# Patient Record
Sex: Female | Born: 1955 | Race: Black or African American | Hispanic: No | Marital: Married | Smoking: Never smoker
Health system: Southern US, Community
[De-identification: ages and names within clinical notes are randomized; demographics above are authoritative.]

## PROBLEM LIST (undated history)

## (undated) DIAGNOSIS — F419 Anxiety disorder, unspecified: Secondary | ICD-10-CM

## (undated) DIAGNOSIS — M1990 Unspecified osteoarthritis, unspecified site: Secondary | ICD-10-CM

## (undated) DIAGNOSIS — F331 Major depressive disorder, recurrent, moderate: Secondary | ICD-10-CM

## (undated) DIAGNOSIS — E119 Type 2 diabetes mellitus without complications: Secondary | ICD-10-CM

## (undated) DIAGNOSIS — G8929 Other chronic pain: Secondary | ICD-10-CM

## (undated) DIAGNOSIS — E079 Disorder of thyroid, unspecified: Secondary | ICD-10-CM

## (undated) DIAGNOSIS — T7840XA Allergy, unspecified, initial encounter: Secondary | ICD-10-CM

## (undated) DIAGNOSIS — G4730 Sleep apnea, unspecified: Secondary | ICD-10-CM

## (undated) DIAGNOSIS — N200 Calculus of kidney: Secondary | ICD-10-CM

## (undated) DIAGNOSIS — R7989 Other specified abnormal findings of blood chemistry: Secondary | ICD-10-CM

## (undated) DIAGNOSIS — G5603 Carpal tunnel syndrome, bilateral upper limbs: Secondary | ICD-10-CM

## (undated) DIAGNOSIS — M549 Dorsalgia, unspecified: Secondary | ICD-10-CM

## (undated) HISTORY — DX: Unspecified osteoarthritis, unspecified site: M19.90

## (undated) HISTORY — PX: NASAL SEPTUM SURGERY: SHX37

## (undated) HISTORY — DX: Other chronic pain: G89.29

## (undated) HISTORY — DX: Sleep apnea, unspecified: G47.30

## (undated) HISTORY — DX: Calculus of kidney: N20.0

## (undated) HISTORY — DX: Anxiety disorder, unspecified: F41.9

## (undated) HISTORY — DX: Allergy, unspecified, initial encounter: T78.40XA

## (undated) HISTORY — DX: Major depressive disorder, recurrent, moderate: F33.1

## (undated) HISTORY — DX: Other specified abnormal findings of blood chemistry: R79.89

## (undated) HISTORY — PX: TUBAL LIGATION: SHX77

## (undated) HISTORY — DX: Type 2 diabetes mellitus without complications: E11.9

## (undated) HISTORY — DX: Carpal tunnel syndrome, bilateral upper limbs: G56.03

## (undated) HISTORY — DX: Disorder of thyroid, unspecified: E07.9

## (undated) HISTORY — DX: Dorsalgia, unspecified: M54.9

## (undated) HISTORY — PX: OOPHORECTOMY: SHX86

## (undated) HISTORY — PX: CHOLECYSTECTOMY: SHX55

---

## 2012-07-08 ENCOUNTER — Emergency Department: Payer: Self-pay | Admitting: Emergency Medicine

## 2012-07-08 LAB — COMPREHENSIVE METABOLIC PANEL
Albumin: 3.8 g/dL (ref 3.4–5.0)
Alkaline Phosphatase: 82 U/L (ref 50–136)
Anion Gap: 5 — ABNORMAL LOW (ref 7–16)
BUN: 14 mg/dL (ref 7–18)
Chloride: 107 mmol/L (ref 98–107)
Co2: 27 mmol/L (ref 21–32)
Creatinine: 0.92 mg/dL (ref 0.60–1.30)
EGFR (African American): >60
EGFR (Non-African Amer.): >60
Glucose: 108 mg/dL — ABNORMAL HIGH (ref 65–99)
Osmolality: 279 (ref 275–301)
Potassium: 3.6 mmol/L (ref 3.5–5.1)
SGOT(AST): 21 U/L (ref 15–37)
Sodium: 139 mmol/L (ref 136–145)
Total Protein: 7.6 g/dL (ref 6.4–8.2)

## 2012-07-08 LAB — CBC
HCT: 36.6 % (ref 35.0–47.0)
MCH: 29.2 pg (ref 26.0–34.0)
RDW: 12.7 % (ref 11.5–14.5)
WBC: 5.5 10*3/uL (ref 3.6–11.0)

## 2012-07-08 LAB — URINALYSIS, COMPLETE
Bacteria: NONE SEEN
Bilirubin,UR: NEGATIVE
Glucose,UR: NEGATIVE mg/dL (ref 0–75)
Leukocyte Esterase: NEGATIVE
Protein: NEGATIVE
RBC,UR: 1 /HPF (ref 0–5)
Squamous Epithelial: 3
WBC UR: 1 /HPF (ref 0–5)

## 2012-07-08 LAB — TROPONIN I: Troponin-I: <0.02 ng/mL

## 2013-11-19 LAB — HM DEXA SCAN

## 2014-06-28 ENCOUNTER — Emergency Department: Payer: Self-pay | Admitting: Emergency Medicine

## 2014-06-28 LAB — CBC WITH DIFFERENTIAL/PLATELET
BASOS PCT: 0.5 %
Basophil #: 0 10*3/uL (ref 0.0–0.1)
EOS PCT: 1.7 %
Eosinophil #: 0.1 10*3/uL (ref 0.0–0.7)
HCT: 36.4 % (ref 35.0–47.0)
HGB: 12.2 g/dL (ref 12.0–16.0)
LYMPHS ABS: 2.9 10*3/uL (ref 1.0–3.6)
LYMPHS PCT: 36.8 %
MCH: 29 pg (ref 26.0–34.0)
MCHC: 33.4 g/dL (ref 32.0–36.0)
MCV: 87 fL (ref 80–100)
MONO ABS: 0.7 x10 3/mm (ref 0.2–0.9)
MONOS PCT: 8.3 %
Neutrophil #: 4.2 10*3/uL (ref 1.4–6.5)
Neutrophil %: 52.7 %
Platelet: 233 10*3/uL (ref 150–440)
RBC: 4.2 10*6/uL (ref 3.80–5.20)
RDW: 12.8 % (ref 11.5–14.5)
WBC: 7.9 10*3/uL (ref 3.6–11.0)

## 2014-06-28 LAB — COMPREHENSIVE METABOLIC PANEL
ALBUMIN: 4.5 g/dL
ALK PHOS: 71 U/L
ALT: 58 U/L — AB
Anion Gap: 6 — ABNORMAL LOW (ref 7–16)
BILIRUBIN TOTAL: 1.4 mg/dL — AB
BUN: 16 mg/dL
CHLORIDE: 104 mmol/L
CO2: 30 mmol/L
CREATININE: 0.79 mg/dL
Calcium, Total: 9.8 mg/dL
EGFR (African American): >60
EGFR (Non-African Amer.): >60
GLUCOSE: 118 mg/dL — AB
Potassium: 3.9 mmol/L
SGOT(AST): 37 U/L
Sodium: 140 mmol/L
Total Protein: 7.5 g/dL

## 2014-06-28 LAB — URINALYSIS, COMPLETE
Bacteria: NONE SEEN
Bilirubin,UR: NEGATIVE
GLUCOSE, UR: NEGATIVE mg/dL (ref 0–75)
Leukocyte Esterase: NEGATIVE
NITRITE: POSITIVE
PROTEIN: NEGATIVE
Ph: 7 (ref 4.5–8.0)
SPECIFIC GRAVITY: 1.018 (ref 1.003–1.030)
Squamous Epithelial: <1

## 2014-07-22 ENCOUNTER — Ambulatory Visit
Admit: 2014-07-22 | Disposition: A | Discharge status: Home / Self Care | Payer: Self-pay | Attending: Urology | Admitting: Urology

## 2014-10-14 ENCOUNTER — Telehealth: Payer: Self-pay

## 2014-10-14 ENCOUNTER — Ambulatory Visit (INDEPENDENT_AMBULATORY_CARE_PROVIDER_SITE_OTHER): Payer: BC Managed Care – PPO | Admitting: Family Medicine

## 2014-10-14 ENCOUNTER — Encounter: Payer: Self-pay | Admitting: Family Medicine

## 2014-10-14 VITALS — BP 130/84 | HR 83 | Temp 97.8°F | Ht 64.6 in | Wt 205.2 lb

## 2014-10-14 DIAGNOSIS — M7671 Peroneal tendinitis, right leg: Secondary | ICD-10-CM | POA: Diagnosis not present

## 2014-10-14 DIAGNOSIS — M9906 Segmental and somatic dysfunction of lower extremity: Secondary | ICD-10-CM

## 2014-10-14 MED ORDER — DICLOFENAC SODIUM 1 % TD GEL: 2.0000 g | Freq: Four times a day (QID) | TRANSDERMAL | Status: DC

## 2014-10-14 NOTE — Progress Notes (Signed)
BP 130/84 mmHg  Pulse 83  Temp(Src) 97.8 F (36.6 C)  Ht 5' 4.6" (1.641 m)  Wt 205 lb 3.2 oz (93.078 kg)  BMI 34.56 kg/m2  SpO2 97%  LMP  (LMP Unknown)   Subjective:    Patient ID: Tracie Sheppard, female    DOB: October 12, 1955, 59 y.o.   MRN: [id number]  HPI: Tracie Sheppard is a 59 y.o. female who presents today to establish care.   Chief Complaint  Patient presents with  . Ankle Pain    right ankle pain, she had have a DO in [place], but he is now teaching so he does not practice that much now.   Had pain in her R foot. Has some bone spurs. Saw podiatry. In a brace. Had been on eterolac- had severe belly pain wth it so did't take it. She notes that she just got back from [place] and did a lot of walking there and thinks that that aggrevated it. She had an x-ray done and was told that she has 3 bone spurs. She saw th podiatrist and he put her in a brace and gave her the medicine. She notes that her foot has hurt before and had been treated with OMM with good results. She would like someone to "squeeze her foot" today to see if it will help. She is otherwise feeling well with no other concerns or complaints at this time.   Relevant past medical, surgical, family and social history reviewed and updated as indicated. Interim medical history since our last visit reviewed. Allergies and medications reviewed and updated.  Review of Systems  Constitutional: Negative.   Respiratory: Negative.   Cardiovascular: Negative.   Musculoskeletal: Positive for myalgias and gait problem. Negative for back pain, joint swelling, arthralgias, neck pain and neck stiffness.    Per HPI unless specifically indicated above     Objective:    BP 130/84 mmHg  Pulse 83  Temp(Src) 97.8 F (36.6 C)  Ht 5' 4.6" (1.641 m)  Wt 205 lb 3.2 oz (93.078 kg)  BMI 34.56 kg/m2  SpO2 97%  LMP  (LMP Unknown)  Wt Readings from Last 3 Encounters:  10/14/14 205 lb 3.2 oz (93.078 kg)    Physical Exam   Constitutional: She is oriented to person, place, and time. She appears well-developed and well-nourished. No distress.  HENT:  Head: Normocephalic and atraumatic.  Right Ear: Hearing normal.  Left Ear: Hearing normal.  Nose: Nose normal.  Eyes: Conjunctivae and lids are normal. Right eye exhibits no discharge. Left eye exhibits no discharge. No scleral icterus.  Pulmonary/Chest: Effort normal. No respiratory distress.  Musculoskeletal: She exhibits tenderness. She exhibits no edema.  Neurological: She is alert and oriented to person, place, and time.  Skin: Skin is intact. No rash noted.  Psychiatric: She has a normal mood and affect. Her speech is normal and behavior is normal. Judgment and thought content normal. Cognition and memory are normal.    Musculoskeletal:  Exam found Decreased ROM, Tissue texture changes and Tenderness to palpation of patient's  lower extremity Osteopathic Structural Exam:   Lower Extremity: 5th metatarsal compressed, 5th toes compressed, fascial strain through interosseous membrane of metatarsals, Posterior fibular head, peroneal hypertonicity, dropped navicular  Results for orders placed or performed in visit on 10/14/14  HM DEXA SCAN  Result Value Ref Range   HM Dexa Scan Osteopenia       Assessment & Plan:   Problem List Items Addressed This Visit  Musculoskeletal and Integument   Peroneal tendonitis of right lower extremity - Primary    Other Visit Diagnoses    Lower limb region somatic dysfunction        see below.      After verbal consent was obtained, patient was treated today with osteopathic manipulative medicine to the regions of the lower extremity using the techniques of Still, myofascial release, muscle energy, HVLA and soft tissue. Areas of compensation relating to her primary pain source also treated. Patient tolerated the procedure well with good objective and good subjective improvement in symptoms. .sex left the room in good  condition. He was advised to stay well hydrated and that he may have some soreness following the procedure. If not improving or worsening, he will call and come in. Home exercise program of stretches for ankle discussed and demonstrated today. Patient will do these stretches BID to before the point of pain, and will return for reevaluation  in 1-2 weeks.   Follow up plan: Return 1-2 weeks, for OMM.

## 2014-10-14 NOTE — Telephone Encounter (Signed)
Received a voicemail from patient, she was prescribed a gel for her ankle pain. She was told by her pharmacy that it had to have prior approval through insurance. I spoke with Jonelle Sidle Foxx to make sure she has recieved the form for this.

## 2014-10-14 NOTE — Patient Instructions (Signed)
Peroneal Tendinitis with Rehab Tendonitis is inflammation of a tendon. Inflammation of the tendons on the back of the outer ankle (peroneal tendons) is known as peroneal tendonitis. The peroneal tendons are responsible for connecting the muscles that allow you to stand on your tiptoes to the bones of the ankle. For this reason, peroneal tendonitis often causes pain when trying to complete such motions. Peroneal tendonitis often involves a tear (strain) of the peroneal tendons. Strains are classified into three categories. Grade 1 strains cause pain, but the tendon is not lengthened. Grade 2 strains include a lengthened ligament, due to the ligament being stretched or partially ruptured. With grade 2 strains there is still function, although function may be decreased. Grade 3 strains involve a complete tear of the tendon or muscle, and function is usually impaired. SYMPTOMS   Pain, tenderness, swelling, warmth, or redness over the back of the outer side of the ankle, the outer part of the mid-foot, or the bottom of the arch.  Pain that gets worse with ankle motion (especially when pushing off or pushing down with the front of the foot), or when standing on the ball of the foot or pushing the foot outward.  Crackling sound (crepitation) when the tendon is moved or touched. CAUSES  Peroneal tendinitis occurs when injury to the peroneal tendons causes the body to respond with inflammation. Common causes of injury include:  An overuse injury, in which the groove behind the outer ankle (where the tendon is located) causes wear on the tendon.  A sudden stress placed on the tendon, such as from an increase in the intensity, frequency, or duration of training.  Direct hit (trauma) to the tendon.  Return to activity too soon after a previous ankle injury. RISK INCREASES WITH:  Sports that require sudden, repetitive pushing off of the foot, such as jumping or quick starts.  Kicking and running sports,  especially running down hills or long distances.  Poor strength and flexibility.  Previous injury to the foot, ankle, or leg. PREVENTION  Warm up and stretch properly before activity.  Allow for adequate recovery between workouts.  Maintain physical fitness:  Strength, flexibility, and endurance.  Cardiovascular fitness.  Complete rehabilitation after previous injury. PROGNOSIS  If treated properly, peroneal tendonitis usually heals within 6 weeks.  RELATED COMPLICATIONS  Longer healing time, if not properly treated or if not given enough time to heal.  Recurring symptoms if activity is resumed too soon, with overuse, or when using poor technique.  If untreated, tendinitis may result in tendon rupture, requiring surgery. TREATMENT  Treatment first involves the use of ice and medicine to reduce pain and inflammation. The use of strengthening and stretching exercises may help reduce pain with activity. These exercises may be performed at home or with a therapist. Sometimes, the foot and ankle will be restrained for 10 to 14 days to promote healing. Your caregiver may advise that you place a heel lift in your shoes to reduce the stress placed on the tendon. If nonsurgical treatment is unsuccessful, surgery to remove the inflamed tendon lining (sheath) may be advised.  MEDICATION   If pain medicine is needed, nonsteroidal anti-inflammatory medicines (aspirin and ibuprofen), or other minor pain relievers (acetaminophen), are often advised.  Do not take pain medicine for 7 days before surgery.  Prescription pain relievers may be given, if your caregiver thinks they are needed. Use only as directed and only as much as you need. HEAT AND COLD  Cold treatment (icing) should   be applied for 10 to 15 minutes every 2 to 3 hours for inflammation and pain, and immediately after activity that aggravates your symptoms. Use ice packs or an ice massage.  Heat treatment may be used before  performing stretching and strengthening activities prescribed by your caregiver, physical therapist, or athletic trainer. Use a heat pack or a warm water soak. SEEK MEDICAL CARE IF:  Symptoms get worse or do not improve in 2 to 4 weeks, despite treatment.  New, unexplained symptoms develop. (Drugs used in treatment may produce side effects.) EXERCISES RANGE OF MOTION (ROM) AND STRETCHING EXERCISES - Peroneal Tendinitis These exercises may help you when beginning to rehabilitate your injury. Your symptoms may resolve with or without further involvement from your physician, physical therapist or athletic trainer. While completing these exercises, remember:   Restoring tissue flexibility helps normal motion to return to the joints. This allows healthier, less painful movement and activity.  An effective stretch should be held for at least 30 seconds.  A stretch should never be painful. You should only feel a gentle lengthening or release in the stretched tissue. RANGE OF MOTION - Ankle Eversion  Sit with your right / left ankle crossed over your opposite knee.  Grip your foot with your opposite hand, placing your thumb on the top of your foot and your fingers across the bottom of your foot.  Gently push your foot downward with a slight rotation, so your littlest toes rise slightly toward the ceiling.  You should feel a gentle stretch on the inside of your ankle. Hold the stretch for __________ seconds. Repeat __________ times. Complete this exercise __________ times per day.  RANGE OF MOTION - Ankle Inversion  Sit with your right / left ankle crossed over your opposite knee.  Grip your foot with your opposite hand, placing your thumb on the bottom of your foot and your fingers across the top of your foot.  Gently pull your foot so the smallest toe comes toward you and your thumb pushes the inside of the ball of your foot away from you.  You should feel a gentle stretch on the outside of  your ankle. Hold the stretch for __________ seconds. Repeat __________ times. Complete this exercise __________ times per day.  RANGE OF MOTION - Ankle Plantar Flexion  Sit with your right / left leg crossed over your opposite knee.  Use your opposite hand to pull the top of your foot and toes toward you.  You should feel a gentle stretch on the top of your foot and ankle. Hold this position for __________ seconds. Repeat __________ times. Complete __________ times per day.  STRETCH - Gastroc, Standing  Place your hands on a wall.  Extend your right / left leg behind you, keeping the front knee somewhat bent.  Slightly point your toes inward on your back foot.  Keeping your right / left heel on the floor and your knee straight, shift your weight toward the wall, not allowing your back to arch.  You should feel a gentle stretch in the calf. Hold this position for __________ seconds. Repeat __________ times. Complete this stretch __________ times per day. STRETCH - Soleus, Standing  Place your hands on a wall.  Extend your right / left leg behind you, keeping the other knee somewhat bent.  Slightly point your toes inward on your back foot.  Keep your heel on the floor, bend your back knee, and slightly shift your weight over the back leg so that   you feel a gentle stretch deep in your back calf.  Hold this position for __________ seconds. Repeat __________ times. Complete this stretch __________ times per day. STRETCH - Gastrocsoleus, Standing Note: This exercise can place a lot of stress on your foot and ankle. Please complete this exercise only if specifically instructed by your caregiver.   Place the ball of your right / left foot on a step, keeping your other foot firmly on the same step.  Hold on to the wall or a rail for balance.  Slowly lift your other foot, allowing your body weight to press your heel down over the edge of the step.  You should feel a stretch in your  right / left calf.  Hold this position for __________ seconds.  Repeat this exercise with a slight bend in your knee. Repeat __________ times. Complete this stretch __________ times per day.  STRENGTHENING EXERCISES - Peroneal Tendinitis  These exercises may help you when beginning to rehabilitate your injury. They may resolve your symptoms with or without further involvement from your physician, physical therapist or athletic trainer. While completing these exercises, remember:   Muscles can gain both the endurance and the strength needed for everyday activities through controlled exercises.  Complete these exercises as instructed by your physician, physical therapist or athletic trainer. Increase the resistance and repetitions only as guided by your caregiver. STRENGTH - Dorsiflexors  Secure a rubber exercise band or tubing to a fixed object (table, pole) and loop the other end around your right / left foot.  Sit on the floor facing the fixed object. The band should be slightly tense when your foot is relaxed.  Slowly draw your foot back toward you, using your ankle and toes.  Hold this position for __________ seconds. Slowly release the tension in the band and return your foot to the starting position. Repeat __________ times. Complete this exercise __________ times per day.  STRENGTH - Towel Curls  Sit in a chair, on a non-carpeted surface.  Place your foot on a towel, keeping your heel on the floor.  Pull the towel toward your heel only by curling your toes. Keep your heel on the floor.  If instructed by your physician, physical therapist or athletic trainer, add weight to the end of the towel. Repeat __________ times. Complete this exercise __________ times per day. STRENGTH - Ankle Eversion   Secure one end of a rubber exercise band or tubing to a fixed object (table, pole). Loop the other end around your foot, just before your toes.  Place your fists between your knees.  This will focus your strengthening at your ankle.  Drawing the band across your opposite foot, away from the pole, slowly, pull your little toe out and up. Make sure the band is positioned to resist the entire motion.  Hold this position for __________ seconds.  Have your muscles resist the band, as it slowly pulls your foot back to the starting position. Repeat __________ times. Complete this exercise __________ times per day.  Document Released: 03/21/2005 Document Revised: 08/05/2013 Document Reviewed: 07/03/2008 [employer] Patient Information 2015 [employer]. This information is not intended to replace advice given to you by your health care provider. Make sure you discuss any questions you have with your health care provider.  

## 2014-10-14 NOTE — Telephone Encounter (Signed)
Called and notified patient of the process of a prior authorization,she understood, I let her know it could take up to 72hrs.

## 2014-10-22 ENCOUNTER — Telehealth: Payer: Self-pay | Admitting: Family Medicine

## 2014-10-22 NOTE — Telephone Encounter (Signed)
E-fax came through for refill on: Rx: diclofenac sodium (VOLTAREN) 1 % GEL Copy in basket

## 2014-10-23 NOTE — Telephone Encounter (Signed)
Received fax, prior authorization was done and approved. It is approved from June 20,2016 to July 20,2017. Patient notified of approval.

## 2014-10-29 ENCOUNTER — Ambulatory Visit: Payer: BC Managed Care – PPO | Admitting: Family Medicine

## 2014-10-31 ENCOUNTER — Encounter: Payer: Self-pay | Admitting: Family Medicine

## 2014-10-31 ENCOUNTER — Ambulatory Visit (INDEPENDENT_AMBULATORY_CARE_PROVIDER_SITE_OTHER): Payer: BC Managed Care – PPO | Admitting: Family Medicine

## 2014-10-31 VITALS — BP 116/75 | HR 94 | Temp 98.0°F | Wt 206.0 lb

## 2014-10-31 DIAGNOSIS — M9905 Segmental and somatic dysfunction of pelvic region: Secondary | ICD-10-CM | POA: Diagnosis not present

## 2014-10-31 DIAGNOSIS — M9906 Segmental and somatic dysfunction of lower extremity: Secondary | ICD-10-CM | POA: Diagnosis not present

## 2014-10-31 DIAGNOSIS — M9901 Segmental and somatic dysfunction of cervical region: Secondary | ICD-10-CM

## 2014-10-31 DIAGNOSIS — M545 Low back pain, unspecified: Secondary | ICD-10-CM

## 2014-10-31 DIAGNOSIS — M9903 Segmental and somatic dysfunction of lumbar region: Secondary | ICD-10-CM

## 2014-10-31 DIAGNOSIS — M9902 Segmental and somatic dysfunction of thoracic region: Secondary | ICD-10-CM

## 2014-10-31 DIAGNOSIS — M9900 Segmental and somatic dysfunction of head region: Secondary | ICD-10-CM

## 2014-10-31 DIAGNOSIS — M489 Spondylopathy, unspecified: Secondary | ICD-10-CM

## 2014-10-31 DIAGNOSIS — M9904 Segmental and somatic dysfunction of sacral region: Secondary | ICD-10-CM

## 2014-10-31 DIAGNOSIS — M9909 Segmental and somatic dysfunction of abdomen and other regions: Secondary | ICD-10-CM | POA: Diagnosis not present

## 2014-10-31 DIAGNOSIS — M7671 Peroneal tendinitis, right leg: Secondary | ICD-10-CM

## 2014-10-31 DIAGNOSIS — M999 Biomechanical lesion, unspecified: Secondary | ICD-10-CM
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
## 2014-11-01 DIAGNOSIS — M545 Low back pain, unspecified: Secondary | ICD-10-CM | POA: Insufficient documentation
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
## 2014-11-01 NOTE — Assessment & Plan Note (Signed)
Doing significantly better following treatment. Did not see podiatry. Has been wearing brace as needed. Use voltaren gel as needed to avoid belly pain. Patient does appear to have somatic dysfunction that I think would benefit from OMT. Patient treated today with good results as discussed below.

## 2014-11-17 ENCOUNTER — Ambulatory Visit (INDEPENDENT_AMBULATORY_CARE_PROVIDER_SITE_OTHER): Payer: BC Managed Care – PPO | Admitting: Family Medicine

## 2014-11-17 ENCOUNTER — Encounter: Payer: Self-pay | Admitting: Family Medicine

## 2014-11-17 VITALS — BP 122/74 | HR 77 | Temp 98.3°F | Wt 205.2 lb

## 2014-11-17 DIAGNOSIS — M9903 Segmental and somatic dysfunction of lumbar region: Secondary | ICD-10-CM | POA: Diagnosis not present

## 2014-11-17 DIAGNOSIS — M489 Spondylopathy, unspecified: Secondary | ICD-10-CM

## 2014-11-17 DIAGNOSIS — M9904 Segmental and somatic dysfunction of sacral region: Secondary | ICD-10-CM | POA: Diagnosis not present

## 2014-11-17 DIAGNOSIS — M9906 Segmental and somatic dysfunction of lower extremity: Secondary | ICD-10-CM

## 2014-11-17 DIAGNOSIS — M545 Low back pain, unspecified: Secondary | ICD-10-CM

## 2014-11-17 DIAGNOSIS — M9905 Segmental and somatic dysfunction of pelvic region: Secondary | ICD-10-CM

## 2014-11-17 DIAGNOSIS — M9902 Segmental and somatic dysfunction of thoracic region: Secondary | ICD-10-CM | POA: Diagnosis not present
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
## 2014-11-17 NOTE — Patient Instructions (Signed)
Sports Medicine Patient Advisor Stretches and Information on IT bands given today.

## 2014-11-19 ENCOUNTER — Ambulatory Visit: Payer: BC Managed Care – PPO | Admitting: Family Medicine

## 2014-11-21 ENCOUNTER — Ambulatory Visit: Payer: BC Managed Care – PPO | Admitting: Family Medicine

## 2014-12-22 ENCOUNTER — Encounter: Payer: Self-pay | Admitting: Family Medicine

## 2014-12-22 ENCOUNTER — Ambulatory Visit (INDEPENDENT_AMBULATORY_CARE_PROVIDER_SITE_OTHER): Payer: BC Managed Care – PPO | Admitting: Family Medicine

## 2014-12-22 VITALS — BP 115/72 | HR 92 | Temp 97.6°F | Wt 200.0 lb

## 2014-12-22 DIAGNOSIS — M9908 Segmental and somatic dysfunction of rib cage: Secondary | ICD-10-CM

## 2014-12-22 DIAGNOSIS — M9909 Segmental and somatic dysfunction of abdomen and other regions: Secondary | ICD-10-CM | POA: Diagnosis not present

## 2014-12-22 DIAGNOSIS — M9904 Segmental and somatic dysfunction of sacral region: Secondary | ICD-10-CM

## 2014-12-22 DIAGNOSIS — J069 Acute upper respiratory infection, unspecified: Secondary | ICD-10-CM

## 2014-12-22 DIAGNOSIS — M545 Low back pain, unspecified: Secondary | ICD-10-CM

## 2014-12-22 DIAGNOSIS — M999 Biomechanical lesion, unspecified: Secondary | ICD-10-CM

## 2014-12-22 DIAGNOSIS — M9905 Segmental and somatic dysfunction of pelvic region: Secondary | ICD-10-CM | POA: Diagnosis not present

## 2014-12-22 DIAGNOSIS — M9902 Segmental and somatic dysfunction of thoracic region: Secondary | ICD-10-CM

## 2014-12-22 DIAGNOSIS — M9903 Segmental and somatic dysfunction of lumbar region: Secondary | ICD-10-CM | POA: Diagnosis not present

## 2014-12-22 DIAGNOSIS — M9900 Segmental and somatic dysfunction of head region: Secondary | ICD-10-CM

## 2014-12-22 DIAGNOSIS — M489 Spondylopathy, unspecified: Secondary | ICD-10-CM

## 2014-12-22 LAB — INFLUENZA A+B AG, EIA
INFLUENZA A AG, EIA: NEGATIVE
Influenza B Ag, EIA: NEGATIVE

## 2014-12-22 LAB — PLEASE NOTE:

## 2014-12-22 MED ORDER — BENZONATATE 200 MG PO CAPS: 200.0000 mg | ORAL_CAPSULE | Freq: Two times a day (BID) | ORAL | Status: DC | PRN

## 2014-12-22 MED ORDER — AZITHROMYCIN 250 MG PO TABS: ORAL_TABLET | ORAL | Status: DC

## 2014-12-22 MED ORDER — PREDNISONE 10 MG PO TABS: ORAL_TABLET | ORAL | Status: DC
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
## 2014-12-23 ENCOUNTER — Telehealth: Payer: Self-pay | Admitting: Family Medicine

## 2014-12-23 NOTE — Telephone Encounter (Signed)
Patient notified that sweating is a side effect of the prednisone.

## 2014-12-23 NOTE — Telephone Encounter (Signed)
Pt is sweating extremely bad and would like to know if this is normal

## 2015-01-05 ENCOUNTER — Ambulatory Visit: Payer: BC Managed Care – PPO | Admitting: Family Medicine

## 2015-01-07 ENCOUNTER — Ambulatory Visit (INDEPENDENT_AMBULATORY_CARE_PROVIDER_SITE_OTHER): Payer: BC Managed Care – PPO | Admitting: Family Medicine

## 2015-01-07 ENCOUNTER — Encounter: Payer: Self-pay | Admitting: Family Medicine

## 2015-01-07 VITALS — BP 125/80 | HR 89 | Temp 98.2°F | Ht 64.3 in | Wt 199.0 lb

## 2015-01-07 DIAGNOSIS — N393 Stress incontinence (female) (male): Secondary | ICD-10-CM

## 2015-01-07 DIAGNOSIS — N3001 Acute cystitis with hematuria: Secondary | ICD-10-CM | POA: Diagnosis not present

## 2015-01-07 DIAGNOSIS — J209 Acute bronchitis, unspecified: Secondary | ICD-10-CM | POA: Diagnosis not present

## 2015-01-07 LAB — MICROSCOPIC EXAMINATION
RENAL EPITHEL UA: NONE SEEN /HPF
WBC, UA: >30 /hpf — AB (ref 0–?)

## 2015-01-07 MED ORDER — ALBUTEROL SULFATE HFA 108 (90 BASE) MCG/ACT IN AERS: 2.0000 | INHALATION_SPRAY | Freq: Four times a day (QID) | RESPIRATORY_TRACT | Status: DC | PRN

## 2015-01-07 MED ORDER — BENZONATATE 200 MG PO CAPS: 200.0000 mg | ORAL_CAPSULE | Freq: Two times a day (BID) | ORAL | Status: DC | PRN

## 2015-01-07 MED ORDER — CIPROFLOXACIN HCL 500 MG PO TABS
500.0000 mg | ORAL_TABLET | Freq: Two times a day (BID) | ORAL | Status: DC
Start: 2015-01-07 — End: 2015-01-21

## 2015-01-07 NOTE — Progress Notes (Signed)
BP 125/80 mmHg  Pulse 89  Temp(Src) 98.2 F (36.8 C)  Ht 5' 4.3" (1.633 m)  Wt 199 lb (90.266 kg)  BMI 33.85 kg/m2  SpO2 98%  LMP  (LMP Unknown)   Subjective:    Patient ID: Tracie Sheppard, female    DOB: Jan 13, 1956, 59 y.o.   MRN: [id number]  HPI: Tracie Sheppard is a 59 y.o. female  Chief Complaint  Patient presents with  . lung recheck   Tracie Sheppard presents today for a lung recheck following a case of bronchitis. She states that she is feeling better following her course of prednisone and her z-pack. But she is not feeling 100%, still coughing quite a lot. Notes quite a bit of sweating. Still quite tired. Notes that she has been having stress incontinence every time she coughs. Concerned and wants to make sure she feels better.  URINARY SYMPTOMS Dysuria: no Urinary frequency: yes Urgency: yes Small volume voids: no Symptom severity: moderate Urinary incontinence: yes only with coughing Foul odor: no Hematuria: no Abdominal pain: no Back pain: no Suprapubic pain/pressure: no Flank pain: no Fever:  no Vomiting: no  Relevant past medical, surgical, family and social history reviewed and updated as indicated. Interim medical history since our last visit reviewed. Allergies and medications reviewed and updated.  Review of Systems  Constitutional: Negative.   HENT: Negative.   Respiratory: Negative.   Cardiovascular: Negative.   Psychiatric/Behavioral: Negative.    Per HPI unless specifically indicated above     Objective:    BP 125/80 mmHg  Pulse 89  Temp(Src) 98.2 F (36.8 C)  Ht 5' 4.3" (1.633 m)  Wt 199 lb (90.266 kg)  BMI 33.85 kg/m2  SpO2 98%  LMP  (LMP Unknown)  Wt Readings from Last 3 Encounters:  01/07/15 199 lb (90.266 kg)  12/22/14 200 lb (90.719 kg)  11/17/14 205 lb 3.2 oz (93.078 kg)    Physical Exam  Constitutional: She is oriented to person, place, and time. She appears well-developed and well-nourished. No distress.  HENT:  Head:  Normocephalic and atraumatic.  Right Ear: Hearing normal.  Left Ear: Hearing normal.  Nose: Nose normal.  Eyes: Conjunctivae and lids are normal. Right eye exhibits no discharge. Left eye exhibits no discharge. No scleral icterus.  Cardiovascular: Normal rate, regular rhythm, normal heart sounds and intact distal pulses.  Exam reveals no gallop and no friction rub.   No murmur heard. Pulmonary/Chest: Effort normal and breath sounds normal. No respiratory distress. She has no wheezes. She has no rales. She exhibits no tenderness.  Musculoskeletal: Normal range of motion.  Neurological: She is alert and oriented to person, place, and time.  Skin: Skin is warm, dry and intact. No rash noted. No erythema. No pallor.  Psychiatric: She has a normal mood and affect. Her speech is normal and behavior is normal. Judgment and thought content normal. Cognition and memory are normal.  Nursing note and vitals reviewed.   Results for orders placed or performed in visit on 12/22/14  Influenza A+B Ag, EIA  Result Value Ref Range   Influenza A Ag, EIA Negative Negative   Influenza B Ag, EIA Negative Negative   Influenza Comment See note   Please note:  Result Value Ref Range   Please note: Comment       Assessment & Plan:   Problem List Items Addressed This Visit    None    Visit Diagnoses    Acute bronchitis, unspecified organism    -  Primary    Resolved, now post-infectious cough. Continue tessalon perles and will start inhaler. Continue to monitor. Let us know if not getting better or getting worse.     Stress incontinence        UA positive for UTI today. Information about stress incontinence given. Timed voiding. Continue to mointor.     Relevant Orders    UA/M w/rflx Culture, Routine    Acute cystitis with hematuria        +UA- will treat for 7 days with cipro. Advised probiotics. Continue to monitor.         Follow up plan: Return if symptoms worsen or fail to  improve.

## 2015-01-07 NOTE — Patient Instructions (Signed)
Urinary Incontinence Urinary incontinence is the involuntary loss of urine from your bladder. CAUSES  There are many causes of urinary incontinence. They include:  Medicines.  Infections.  Prostatic enlargement, leading to overflow of urine from your bladder.  Surgery.  Neurological diseases.  Emotional factors. SIGNS AND SYMPTOMS Urinary Incontinence can be divided into four types: 1. Urge incontinence. Urge incontinence is the involuntary loss of urine before you have the opportunity to go to the bathroom. There is a sudden urge to void but not enough time to reach a bathroom. 2. Stress incontinence. Stress incontinence is the sudden loss of urine with any activity that forces urine to pass. It is commonly caused by anatomical changes to the pelvis and sphincter areas of your body. 3. Overflow incontinence. Overflow incontinence is the loss of urine from an obstructed opening to your bladder. This results in a backup of urine and a resultant buildup of pressure within the bladder. When the pressure within the bladder exceeds the closing pressure of the sphincter, the urine overflows, which causes incontinence, similar to water overflowing a dam. 4. Total incontinence. Total incontinence is the loss of urine as a result of the inability to store urine within your bladder. DIAGNOSIS  Evaluating the cause of incontinence may require:  A thorough and complete medical and obstetric history.  A complete physical exam.  Laboratory tests such as a urine culture and sensitivities. When additional tests are indicated, they can include:  An ultrasound exam.  Kidney and bladder X-rays.  Cystoscopy. This is an exam of the bladder using a narrow scope.  Urodynamic testing to test the nerve function to the bladder and sphincter areas. TREATMENT  Treatment for urinary incontinence depends on the cause:  For urge incontinence caused by a bacterial infection, antibiotics will be prescribed.  If the urge incontinence is related to medicines you take, your health care provider may have you change the medicine.  For stress incontinence, surgery to re-establish anatomical support to the bladder or sphincter, or both, will often correct the condition.  For overflow incontinence caused by an enlarged prostate, an operation to open the channel through the enlarged prostate will allow the flow of urine out of the bladder. In women with fibroids, a hysterectomy may be recommended.  For total incontinence, surgery on your urinary sphincter may help. An artificial urinary sphincter (an inflatable cuff placed around the urethra) may be required. In women who have developed a hole-like passage between their bladder and vagina (vesicovaginal fistula), surgery to close the fistula often is required. HOME CARE INSTRUCTIONS  Normal daily hygiene and the use of pads or adult diapers that are changed regularly will help prevent odors and skin damage.  Avoid caffeine. It can overstimulate your bladder.  Use the bathroom regularly. Try about every 2-3 hours to go to the bathroom, even if you do not feel the need to do so. Take time to empty your bladder completely. After urinating, wait a minute. Then try to urinate again.  For causes involving nerve dysfunction, keep a log of the medicines you take and a journal of the times you go to the bathroom. SEEK MEDICAL CARE IF:  You experience worsening of pain instead of improvement in pain after your procedure.  Your incontinence becomes worse instead of better. SEE IMMEDIATE MEDICAL CARE IF:  You experience fever or shaking chills.  You are unable to pass your urine.  You have redness spreading into your groin or down into your thighs. MAKE SURE   YOU:   Understand these instructions.   Will watch your condition.  Will get help right away if you are not doing well or get worse.   This information is not intended to replace advice given to you  by your health care provider. Make sure you discuss any questions you have with your health care provider.   Document Released: 04/28/2004 Document Revised: 04/11/2014 Document Reviewed: 08/28/2012 Elsevier Interactive Patient Education 2016 [employer]  

## 2015-01-11 LAB — URINE CULTURE, REFLEX

## 2015-01-11 LAB — UA/M W/RFLX CULTURE, ROUTINE

## 2015-01-21 ENCOUNTER — Ambulatory Visit (INDEPENDENT_AMBULATORY_CARE_PROVIDER_SITE_OTHER): Payer: BC Managed Care – PPO | Admitting: Family Medicine

## 2015-01-21 ENCOUNTER — Encounter: Payer: Self-pay | Admitting: Family Medicine

## 2015-01-21 VITALS — BP 111/68 | HR 82 | Temp 98.3°F | Ht 64.6 in | Wt 199.0 lb

## 2015-01-21 DIAGNOSIS — R319 Hematuria, unspecified: Secondary | ICD-10-CM | POA: Diagnosis not present

## 2015-01-21 DIAGNOSIS — R32 Unspecified urinary incontinence: Secondary | ICD-10-CM | POA: Diagnosis not present

## 2015-01-21 DIAGNOSIS — I952 Hypotension due to drugs: Secondary | ICD-10-CM

## 2015-01-21 LAB — MICROSCOPIC EXAMINATION

## 2015-01-21 LAB — UA/M W/RFLX CULTURE, ROUTINE
BILIRUBIN UA: NEGATIVE
Glucose, UA: NEGATIVE
Ketones, UA: NEGATIVE
Leukocytes, UA: NEGATIVE
NITRITE UA: NEGATIVE
PH UA: 5.5 (ref 5.0–7.5)
Protein, UA: NEGATIVE
Specific Gravity, UA: 1.025 (ref 1.005–1.030)
UUROB: 0.2 mg/dL (ref 0.2–1.0)

## 2015-01-21 NOTE — Patient Instructions (Signed)
Hypotension  As your heart beats, it forces blood through your arteries. This force is your blood pressure. If your blood pressure is too low for you to go about your normal activities or to support the organs of your body, you have hypotension. Hypotension is also referred to as low blood pressure. When your blood pressure becomes too low, you may not get enough blood to your brain. As a result, you may feel weak, feel lightheaded, or develop a rapid heart rate. In a more severe case, you may faint.  CAUSES  Various conditions can cause hypotension. These include:  · Blood loss.  · Dehydration.  · Heart or endocrine problems.  · Pregnancy.  · Severe infection.  · Not having a well-balanced diet filled with needed nutrients.  · Severe allergic reactions (anaphylaxis).  Some medicines, such as blood pressure medicine or water pills (diuretics), may lower your blood pressure below normal. Sometimes taking too much medicine or taking medicine not as directed can cause hypotension.  TREATMENT   Hospitalization is sometimes required for hypotension if fluid or blood replacement is needed, if time is needed for medicines to wear off, or if further monitoring is needed. Treatment might include changing your diet, changing your medicines (including medicines aimed at raising your blood pressure), and use of support stockings.  HOME CARE INSTRUCTIONS   · Drink enough fluids to keep your urine clear or pale yellow.  · Take your medicines as directed by your health care provider.  · Get up slowly from reclining or sitting positions. This gives your blood pressure a chance to adjust.  · Wear support stockings as directed by your health care provider.  · Maintain a healthy diet by including nutritious food, such as fruits, vegetables, nuts, whole grains, and lean meats.  SEEK MEDICAL CARE IF:  · You have vomiting or diarrhea.  · You have a fever for more than 2-3 days.  · You feel more thirsty than usual.  · You feel weak and  tired.  SEEK IMMEDIATE MEDICAL CARE IF:   · You have chest pain or a fast or irregular heartbeat.  · You have a loss of feeling in some part of your body, or you lose movement in your arms or legs.  · You have trouble speaking.  · You become sweaty or feel lightheaded.  · You faint.  MAKE SURE YOU:   · Understand these instructions.  · Will watch your condition.  · Will get help right away if you are not doing well or get worse.     This information is not intended to replace advice given to you by your health care provider. Make sure you discuss any questions you have with your health care provider.     Document Released: 03/21/2005 Document Revised: 01/09/2013 Document Reviewed: 09/21/2012  Elsevier Interactive Patient Education ©2016 [employer]

## 2015-01-21 NOTE — Progress Notes (Signed)
BP 111/68 mmHg  Pulse 82  Temp(Src) 98.3 F (36.8 C)  Ht 5' 4.6" (1.641 m)  Wt 199 lb (90.266 kg)  BMI 33.52 kg/m2  SpO2 96%  LMP  (LMP Unknown)   Subjective:    Patient ID: Tracie Sheppard, female    DOB: November 29, 1955, 60 y.o.   MRN: [id number]  HPI: Tracie Sheppard is a 59 y.o. female  Chief Complaint  Patient presents with  . Urinary Tract Infection    follow up   URINARY SYMPTOMS Dysuria: no Urinary frequency: no Urgency: no Small volume voids: yes Symptom severity: mild Urinary incontinence: no Foul odor: no Hematuria: no Abdominal pain: no Back pain: no  Suprapubic pain/pressure: yes Flank pain: no Fever:  yes, no, subjective and low grade Vomiting: no  Had shrimp about a month ago and felt awful following eating it- nausea and vomiting, does not want to have a RAST test right now  Started feeling nauseous just a couple of minutes ago  Relevant past medical, surgical, family and social history reviewed and updated as indicated. Interim medical history since our last visit reviewed. Allergies and medications reviewed and updated.  Review of Systems  Constitutional: Negative.   Respiratory: Negative.   Cardiovascular: Negative.   Gastrointestinal: Positive for nausea. Negative for vomiting, diarrhea, constipation, abdominal distention, anal bleeding and rectal pain.  Genitourinary: Negative.   Psychiatric/Behavioral: Negative.     Per HPI unless specifically indicated above     Objective:    BP 111/68 mmHg  Pulse 82  Temp(Src) 98.3 F (36.8 C)  Ht 5' 4.6" (1.641 m)  Wt 199 lb (90.266 kg)  BMI 33.52 kg/m2  SpO2 96%  LMP  (LMP Unknown)  Wt Readings from Last 3 Encounters:  01/21/15 199 lb (90.266 kg)  01/07/15 199 lb (90.266 kg)  12/22/14 200 lb (90.719 kg)    Physical Exam  Constitutional: She is oriented to person, place, and time. She appears well-developed and well-nourished. No distress.  HENT:  Head: Normocephalic and atraumatic.  Right  Ear: Hearing normal.  Left Ear: Hearing normal.  Nose: Nose normal.  Eyes: Conjunctivae and lids are normal. Right eye exhibits no discharge. Left eye exhibits no discharge. No scleral icterus.  Cardiovascular: Normal rate, regular rhythm, normal heart sounds and intact distal pulses.  Exam reveals no gallop and no friction rub.   No murmur heard. Pulmonary/Chest: Effort normal and breath sounds normal. No respiratory distress. She has no wheezes. She has no rales. She exhibits no tenderness.  Musculoskeletal: Normal range of motion.  Neurological: She is alert and oriented to person, place, and time.  Skin: Skin is intact. No rash noted.  Psychiatric: She has a normal mood and affect. Her speech is normal and behavior is normal. Judgment and thought content normal. Cognition and memory are normal.  Nursing note and vitals reviewed.   Results for orders placed or performed in visit on 01/07/15  Microscopic Examination  Result Value Ref Range   WBC, UA >30 (A) 0 -  5 /hpf   RBC, UA 3-10 (A) 0 -  2 /hpf   Epithelial Cells (non renal) 0-10 0 - 10 /hpf   Renal Epithel, UA None seen None seen /hpf   Mucus, UA Present Not Estab.   Bacteria, UA Few None seen/Few  UA/M w/rflx Culture, Routine  Result Value Ref Range   Urine Culture, Routine Final report (A)    Urine Culture result 1 Escherichia coli (A)    ANTIMICROBIAL SUSCEPTIBILITY  Comment   Urine Culture, Routine  Result Value Ref Range   Urine Culture result 1 Escherichia coli (A)       Assessment & Plan:   Problem List Items Addressed This Visit      Cardiovascular and Mediastinum   Hypotension due to drugs - Primary    BP 98/60- did not eat or drink this AM. Better after a big glass of water. Encouraged regular eating and increased fluid intake, if continues to be low, consider stopping her carvedilol        Other   Hematuria    Has a history of stones, but has persistent blood in her urine after UTI cleared up. Will  recheck next visit and if continues, will send her to urology for eval. No pain. No other symptoms.        Other Visit Diagnoses    Urinary incontinence, unspecified incontinence type        Resolved with treatment of UTI. Continue to mointor.     Relevant Orders    UA/M w/rflx Culture, Routine        Follow up plan: Return 2-4 weeks for BP check/OMM if needed.

## 2015-01-21 NOTE — Assessment & Plan Note (Signed)
Has a history of stones, but has persistent blood in her urine after UTI cleared up. Will recheck next visit and if continues, will send her to urology for eval. No pain. No other symptoms.

## 2015-01-21 NOTE — Assessment & Plan Note (Signed)
BP 98/60- did not eat or drink this AM. Better after a big glass of water. Encouraged regular eating and increased fluid intake, if continues to be low, consider stopping her carvedilol

## 2015-02-04 ENCOUNTER — Ambulatory Visit (INDEPENDENT_AMBULATORY_CARE_PROVIDER_SITE_OTHER): Payer: BC Managed Care – PPO | Admitting: Family Medicine

## 2015-02-04 ENCOUNTER — Encounter: Payer: Self-pay | Admitting: Family Medicine

## 2015-02-04 ENCOUNTER — Other Ambulatory Visit: Payer: Self-pay | Admitting: Family Medicine

## 2015-02-04 VITALS — BP 115/80 | HR 92 | Temp 97.3°F | Ht 64.4 in | Wt 197.0 lb

## 2015-02-04 DIAGNOSIS — R319 Hematuria, unspecified: Secondary | ICD-10-CM | POA: Diagnosis not present

## 2015-02-04 DIAGNOSIS — Z23 Encounter for immunization: Secondary | ICD-10-CM | POA: Diagnosis not present

## 2015-02-04 DIAGNOSIS — I952 Hypotension due to drugs: Secondary | ICD-10-CM

## 2015-02-04 DIAGNOSIS — L299 Pruritus, unspecified: Secondary | ICD-10-CM

## 2015-02-04 LAB — MICROSCOPIC EXAMINATION

## 2015-02-04 LAB — UA/M W/RFLX CULTURE, ROUTINE
Bilirubin, UA: NEGATIVE
GLUCOSE, UA: NEGATIVE
Ketones, UA: NEGATIVE
Leukocytes, UA: NEGATIVE
Nitrite, UA: NEGATIVE
Protein, UA: NEGATIVE
Specific Gravity, UA: 1.025 (ref 1.005–1.030)
Urobilinogen, Ur: 0.2 mg/dL (ref 0.2–1.0)
pH, UA: 5.5 (ref 5.0–7.5)

## 2015-02-04 MED ORDER — TRIAMCINOLONE ACETONIDE 0.1 % EX CREA: 1.0000 "application " | TOPICAL_CREAM | Freq: Two times a day (BID) | CUTANEOUS | Status: DC

## 2015-02-04 NOTE — Patient Instructions (Signed)
Kegel Exercises  The goal of Kegel exercises is to isolate and exercise your pelvic floor muscles. These muscles act as a hammock that supports the rectum, vagina, small intestine, and uterus. As the muscles weaken, the hammock sags and these organs are displaced from their normal positions. Kegel exercises can strengthen your pelvic floor muscles and help you to improve bladder and bowel control, improve sexual response, and help reduce many problems and some discomfort during pregnancy. Kegel exercises can be done anywhere and at any time.  HOW TO PERFORM KEGEL EXERCISES  1. Locate your pelvic floor muscles. To do this, squeeze (contract) the muscles that you use when you try to stop the flow of urine. You will feel a tightness in the vaginal area (women) and a tight lift in the rectal area (men and women).  2. When you begin, contract your pelvic muscles tight for 2-5 seconds, then relax them for 2-5 seconds. This is one set. Do 4-5 sets with a short pause in between.  3. Contract your pelvic muscles for 8-10 seconds, then relax them for 8-10 seconds. Do 4-5 sets. If you cannot contract your pelvic muscles for 8-10 seconds, try 5-7 seconds and work your way up to 8-10 seconds. Your goal is 4-5 sets of 10 contractions each day.  Keep your stomach, buttocks, and legs relaxed during the exercises. Perform sets of both short and long contractions. Vary your positions. Perform these contractions 3-4 times per day. Perform sets while you are:    · Lying in bed in the morning.  · Standing at lunch.  · Sitting in the late afternoon.  · Lying in bed at night.   You should do 40-50 contractions per day. Do not perform more Kegel exercises per day than recommended. Overexercising can cause muscle fatigue. Continue these exercises for for at least 15-20 weeks or as directed by your caregiver.     This information is not intended to replace advice given to you by your health care provider. Make sure you discuss any questions  you have with your health care provider.     Document Released: 03/07/2012 Document Revised: 04/11/2014 Document Reviewed: 03/07/2012  Elsevier Interactive Patient Education ©2016 [employer]

## 2015-02-04 NOTE — Assessment & Plan Note (Signed)
Better. Will monitor BP over the next week in the evening when she has not taken her carvedilol. If BP below 140/90, will stop her carvedilol and just continue her spironalactone.

## 2015-02-04 NOTE — Progress Notes (Signed)
BP 115/80 mmHg  Pulse 92  Temp(Src) 97.3 F (36.3 C)  Ht 5' 4.4" (1.636 m)  Wt 197 lb (89.359 kg)  BMI 33.39 kg/m2  SpO2 98%  LMP  (LMP Unknown)   Subjective:    Patient ID: Tracie Sheppard, female    DOB: September 12, 1955, 59 y.o.   MRN: [id number]  HPI: Tracie Sheppard is a 59 y.o. female  Chief Complaint  Patient presents with  . Hypertension  . urine recheck  . skin itching    Patient states that at times she will itch around her neck, on her arms and on her back. She has not changed anything that she uses, she has not noticed a rash.   HYPERTENSION- supposed to take 2 a day, but not taking them in the afternoon.  Hypertension status: BP running low, has lost 20 pounds since being started on the medicine  Satisfied with current treatment? yes Duration of hypertension: chronic BP monitoring frequency:  not checking BP medication side effects:  no Medication compliance: fair compliance Previous BP meds:carvedilol, HCTZ and spironalactone Aspirin: no Recurrent headaches: no Visual changes: no Palpitations: no Dyspnea: no Chest pain: no Lower extremity edema: no Dizzy/lightheaded: yes  Here for a urine recheck- hematuria, feeling a little "stingy" down there, but no other symptoms. No vaginal discharge.   Itching Duration:  Past couple of weeks  Location: neck, arms, back  Itching: yes Burning: no Redness: no Oozing: no Scaling: no Blisters: no Painful: no Fevers: no Change in detergents/soaps/personal care products: no Recent illness: no Recent travel:no History of same: no Context: worse Alleviating factors: nothing Treatments attempted:nothing Shortness of breath: no  Throat/tongue swelling: no Myalgias/arthralgias: no  Relevant past medical, surgical, family and social history reviewed and updated as indicated. Interim medical history since our last visit reviewed. Allergies and medications reviewed and updated.  Review of Systems  Constitutional:  Negative.   Respiratory: Negative.   Cardiovascular: Negative.   Skin: Negative.   Psychiatric/Behavioral: Negative.     Per HPI unless specifically indicated above     Objective:    BP 115/80 mmHg  Pulse 92  Temp(Src) 97.3 F (36.3 C)  Ht 5' 4.4" (1.636 m)  Wt 197 lb (89.359 kg)  BMI 33.39 kg/m2  SpO2 98%  LMP  (LMP Unknown)  Wt Readings from Last 3 Encounters:  02/04/15 197 lb (89.359 kg)  01/21/15 199 lb (90.266 kg)  01/07/15 199 lb (90.266 kg)    Physical Exam  Constitutional: She is oriented to person, place, and time. She appears well-developed and well-nourished. No distress.  HENT:  Head: Normocephalic and atraumatic.  Right Ear: Hearing normal.  Left Ear: Hearing normal.  Nose: Nose normal.  Eyes: Conjunctivae and lids are normal. Right eye exhibits no discharge. Left eye exhibits no discharge. No scleral icterus.  Cardiovascular: Normal rate, regular rhythm, normal heart sounds and intact distal pulses.  Exam reveals no gallop and no friction rub.   No murmur heard. Pulmonary/Chest: Effort normal and breath sounds normal. No respiratory distress. She has no wheezes. She has no rales. She exhibits no tenderness.  Musculoskeletal: Normal range of motion.  Neurological: She is alert and oriented to person, place, and time.  Skin: Skin is warm, dry and intact. No rash noted. No erythema. No pallor.  Very dry skin   Psychiatric: She has a normal mood and affect. Her speech is normal and behavior is normal. Judgment and thought content normal. Cognition and memory are normal.  Nursing  note and vitals reviewed.   Results for orders placed or performed in visit on 02/04/15  Microscopic Examination  Result Value Ref Range   WBC, UA 0-5 0 -  5 /hpf   RBC, UA 0-2 0 -  2 /hpf   Epithelial Cells (non renal) 0-10 0 - 10 /hpf   Bacteria, UA Few None seen/Few  UA/M w/rflx Culture, Routine  Result Value Ref Range   Specific Gravity, UA 1.025 1.005 - 1.030   pH, UA 5.5  5.0 - 7.5   Color, UA Yellow Yellow   Appearance Ur Clear Clear   Leukocytes, UA Negative Negative   Protein, UA Negative Negative/Trace   Glucose, UA Negative Negative   Ketones, UA Negative Negative   RBC, UA 1+ (A) Negative   Bilirubin, UA Negative Negative   Urobilinogen, Ur 0.2 0.2 - 1.0 mg/dL   Nitrite, UA Negative Negative   Microscopic Examination See below:       Assessment & Plan:   Problem List Items Addressed This Visit      Cardiovascular and Mediastinum   Hypotension due to drugs - Primary    Better. Will monitor BP over the next week in the evening when she has not taken her carvedilol. If BP below 140/90, will stop her carvedilol and just continue her spironalactone.         Other   Hematuria    0-2 RBC on microscopy. Recheck 6 months. Will hold on uro referral at this time.        Other Visit Diagnoses    Immunization due        Flu shot given today.    Relevant Orders    Flu Vaccine QUAD 36+ mos PF IM (Fluarix & Fluzone Quad PF) (Completed)    Itching        Likely due to dry skin with the change in weather. Increase mosturizing. Gentle skin care discussed. Will send through triamcinalone PRN.        Follow up plan: Return if symptoms worsen or fail to improve.

## 2015-02-04 NOTE — Assessment & Plan Note (Signed)
0-2 RBC on microscopy. Recheck 6 months. Will hold on uro referral at this time.

## 2015-02-24 ENCOUNTER — Encounter: Payer: Self-pay | Admitting: Family Medicine

## 2015-02-24 ENCOUNTER — Ambulatory Visit (INDEPENDENT_AMBULATORY_CARE_PROVIDER_SITE_OTHER): Payer: BC Managed Care – PPO | Admitting: Family Medicine

## 2015-02-24 VITALS — BP 121/73 | HR 80 | Temp 97.1°F | Ht 64.4 in | Wt 201.0 lb

## 2015-02-24 DIAGNOSIS — M545 Low back pain, unspecified: Secondary | ICD-10-CM

## 2015-02-24 DIAGNOSIS — I952 Hypotension due to drugs: Secondary | ICD-10-CM

## 2015-02-24 DIAGNOSIS — R319 Hematuria, unspecified: Secondary | ICD-10-CM | POA: Diagnosis not present

## 2015-02-24 DIAGNOSIS — M9902 Segmental and somatic dysfunction of thoracic region: Secondary | ICD-10-CM

## 2015-02-24 DIAGNOSIS — M9906 Segmental and somatic dysfunction of lower extremity: Secondary | ICD-10-CM

## 2015-02-24 DIAGNOSIS — M9904 Segmental and somatic dysfunction of sacral region: Secondary | ICD-10-CM

## 2015-02-24 DIAGNOSIS — M9903 Segmental and somatic dysfunction of lumbar region: Secondary | ICD-10-CM | POA: Diagnosis not present

## 2015-02-24 DIAGNOSIS — Z13220 Encounter for screening for lipoid disorders: Secondary | ICD-10-CM | POA: Diagnosis not present

## 2015-02-24 DIAGNOSIS — M9908 Segmental and somatic dysfunction of rib cage: Secondary | ICD-10-CM | POA: Diagnosis not present

## 2015-02-24 DIAGNOSIS — M9900 Segmental and somatic dysfunction of head region: Secondary | ICD-10-CM | POA: Diagnosis not present

## 2015-02-24 DIAGNOSIS — R7301 Impaired fasting glucose: Secondary | ICD-10-CM | POA: Insufficient documentation

## 2015-02-24 DIAGNOSIS — E079 Disorder of thyroid, unspecified: Secondary | ICD-10-CM | POA: Diagnosis not present

## 2015-02-24 DIAGNOSIS — M9905 Segmental and somatic dysfunction of pelvic region: Secondary | ICD-10-CM

## 2015-02-24 LAB — MICROALBUMIN, URINE WAIVED
Creatinine, Urine Waived: 300 mg/dL (ref 10–300)
MICROALB, UR WAIVED: 30 mg/L — AB (ref 0–19)
Microalb/Creat Ratio: <30 mg/g (ref <30)

## 2015-02-24 LAB — UA/M W/RFLX CULTURE, ROUTINE
BILIRUBIN UA: NEGATIVE
Glucose, UA: NEGATIVE
KETONES UA: NEGATIVE
Leukocytes, UA: NEGATIVE
Nitrite, UA: NEGATIVE
PROTEIN UA: NEGATIVE
Urobilinogen, Ur: 0.2 mg/dL (ref 0.2–1.0)
pH, UA: 5.5 (ref 5.0–7.5)

## 2015-02-24 LAB — MICROSCOPIC EXAMINATION

## 2015-02-24 NOTE — Assessment & Plan Note (Signed)
Will recheck labs today. Await results.  
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
## 2015-02-24 NOTE — Assessment & Plan Note (Signed)
Much better off the carvedilol. Will discontinue carvedilol. Recheck next visit. Continue spironalactone.
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
## 2015-02-25 ENCOUNTER — Telehealth: Payer: Self-pay | Admitting: Family Medicine

## 2015-02-25 DIAGNOSIS — E079 Disorder of thyroid, unspecified: Secondary | ICD-10-CM

## 2015-02-25 LAB — COMPREHENSIVE METABOLIC PANEL
A/G RATIO: 1.5 (ref 1.1–2.5)
ALBUMIN: 4.1 g/dL (ref 3.5–5.5)
ALT: 68 IU/L — AB (ref 0–32)
AST: 44 IU/L — ABNORMAL HIGH (ref 0–40)
Alkaline Phosphatase: 76 IU/L (ref 39–117)
BUN / CREAT RATIO: 17 (ref 9–23)
BUN: 14 mg/dL (ref 6–24)
Bilirubin Total: 1.6 mg/dL — ABNORMAL HIGH (ref 0.0–1.2)
CALCIUM: 9.5 mg/dL (ref 8.7–10.2)
CO2: 24 mmol/L (ref 18–29)
Chloride: 103 mmol/L (ref 97–106)
Creatinine, Ser: 0.81 mg/dL (ref 0.57–1.00)
GFR, EST AFRICAN AMERICAN: 93 mL/min/{1.73_m2} (ref >59)
GFR, EST NON AFRICAN AMERICAN: 81 mL/min/{1.73_m2} (ref >59)
Globulin, Total: 2.7 g/dL (ref 1.5–4.5)
Glucose: 87 mg/dL (ref 65–99)
POTASSIUM: 4.1 mmol/L (ref 3.5–5.2)
Sodium: 140 mmol/L (ref 136–144)
TOTAL PROTEIN: 6.8 g/dL (ref 6.0–8.5)

## 2015-02-25 LAB — CBC WITH DIFFERENTIAL/PLATELET
BASOS: 0 %
Basophils Absolute: 0 10*3/uL (ref 0.0–0.2)
EOS (ABSOLUTE): 0.1 10*3/uL (ref 0.0–0.4)
Eos: 1 %
Hematocrit: 35.5 % (ref 34.0–46.6)
Hemoglobin: 11.8 g/dL (ref 11.1–15.9)
IMMATURE GRANS (ABS): 0 10*3/uL (ref 0.0–0.1)
IMMATURE GRANULOCYTES: 0 %
Lymphocytes Absolute: 3.5 10*3/uL — ABNORMAL HIGH (ref 0.7–3.1)
Lymphs: 50 %
MCH: 29.2 pg (ref 26.6–33.0)
MCHC: 33.2 g/dL (ref 31.5–35.7)
MCV: 88 fL (ref 79–97)
MONOCYTES: 7 %
Monocytes Absolute: 0.5 10*3/uL (ref 0.1–0.9)
NEUTROS PCT: 42 %
Neutrophils Absolute: 3 10*3/uL (ref 1.4–7.0)
PLATELETS: 245 10*3/uL (ref 150–379)
RBC: 4.04 x10E6/uL (ref 3.77–5.28)
RDW: 13 % (ref 12.3–15.4)
WBC: 7.1 10*3/uL (ref 3.4–10.8)

## 2015-02-25 LAB — LIPID PANEL W/O CHOL/HDL RATIO
Cholesterol, Total: 203 mg/dL — ABNORMAL HIGH (ref 100–199)
HDL: 48 mg/dL (ref >39)
LDL CALC: 140 mg/dL — AB (ref 0–99)
Triglycerides: 74 mg/dL (ref 0–149)
VLDL Cholesterol Cal: 15 mg/dL (ref 5–40)

## 2015-02-25 LAB — HGB A1C W/O EAG: Hgb A1c MFr Bld: 6 % — ABNORMAL HIGH (ref 4.8–5.6)

## 2015-02-25 LAB — TSH: TSH: 0.04 u[IU]/mL — AB (ref 0.450–4.500)

## 2015-02-25 MED ORDER — THYROID 60 MG PO TABS: 60.0000 mg | ORAL_TABLET | Freq: Every day | ORAL | Status: DC

## 2015-02-25 NOTE — Telephone Encounter (Signed)
Called and discussed labs. Elevated LFTs, likely due to weight gain. Thyroid over treated. Will lower armor thyroid and recheck in 6 weeks.

## 2015-03-18 ENCOUNTER — Telehealth: Payer: Self-pay | Admitting: Family Medicine

## 2015-03-18 NOTE — Telephone Encounter (Signed)
Pt needs a rx for citalopram sent to [facility] [address]

## 2015-03-19 MED ORDER — CITALOPRAM HYDROBROMIDE 40 MG PO TABS: 40.0000 mg | ORAL_TABLET | Freq: Every day | ORAL | Status: DC

## 2015-03-19 NOTE — Telephone Encounter (Signed)
Rx sent to her pharmacy 

## 2015-03-25 ENCOUNTER — Ambulatory Visit: Payer: BC Managed Care – PPO | Admitting: Family Medicine

## 2015-04-01 ENCOUNTER — Ambulatory Visit: Payer: BC Managed Care – PPO | Admitting: Family Medicine

## 2015-04-03 ENCOUNTER — Ambulatory Visit (INDEPENDENT_AMBULATORY_CARE_PROVIDER_SITE_OTHER): Payer: BC Managed Care – PPO | Admitting: Family Medicine

## 2015-04-03 ENCOUNTER — Encounter: Payer: Self-pay | Admitting: Family Medicine

## 2015-04-03 VITALS — BP 128/71 | HR 97 | Temp 98.2°F | Ht 64.1 in | Wt 204.0 lb

## 2015-04-03 DIAGNOSIS — M545 Low back pain, unspecified: Secondary | ICD-10-CM

## 2015-04-03 DIAGNOSIS — M62830 Muscle spasm of back: Secondary | ICD-10-CM | POA: Diagnosis not present

## 2015-04-03 DIAGNOSIS — Z113 Encounter for screening for infections with a predominantly sexual mode of transmission: Secondary | ICD-10-CM

## 2015-04-03 DIAGNOSIS — M9907 Segmental and somatic dysfunction of upper extremity: Secondary | ICD-10-CM

## 2015-04-03 DIAGNOSIS — M9906 Segmental and somatic dysfunction of lower extremity: Secondary | ICD-10-CM | POA: Diagnosis not present

## 2015-04-03 DIAGNOSIS — M7671 Peroneal tendinitis, right leg: Secondary | ICD-10-CM

## 2015-04-03 DIAGNOSIS — M9904 Segmental and somatic dysfunction of sacral region: Secondary | ICD-10-CM

## 2015-04-03 DIAGNOSIS — M9905 Segmental and somatic dysfunction of pelvic region: Secondary | ICD-10-CM | POA: Diagnosis not present

## 2015-04-03 DIAGNOSIS — M9903 Segmental and somatic dysfunction of lumbar region: Secondary | ICD-10-CM

## 2015-04-03 DIAGNOSIS — M79641 Pain in right hand: Secondary | ICD-10-CM

## 2015-04-03 MED ORDER — CYCLOBENZAPRINE HCL 10 MG PO TABS: 10.0000 mg | ORAL_TABLET | Freq: Every day | ORAL | Status: DC
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
## 2015-04-05 NOTE — Assessment & Plan Note (Signed)
Has been in exacerbation at this time. Advised her to restart wearing her brace as needed. Use voltaren gel as needed to avoid belly pain. Patient does appear to have somatic dysfunction that I think would benefit from OMT. Patient treated today with good results as discussed below.

## 2015-04-07 ENCOUNTER — Ambulatory Visit: Payer: BC Managed Care – PPO | Admitting: Family Medicine

## 2015-04-13 ENCOUNTER — Ambulatory Visit: Payer: BC Managed Care – PPO | Admitting: Family Medicine

## 2015-04-16 ENCOUNTER — Ambulatory Visit (INDEPENDENT_AMBULATORY_CARE_PROVIDER_SITE_OTHER): Payer: BC Managed Care – PPO | Admitting: Family Medicine

## 2015-04-16 ENCOUNTER — Encounter: Payer: Self-pay | Admitting: Family Medicine

## 2015-04-16 VITALS — BP 135/81 | HR 94 | Temp 97.9°F | Ht 64.1 in | Wt 202.0 lb

## 2015-04-16 DIAGNOSIS — Z114 Encounter for screening for human immunodeficiency virus [HIV]: Secondary | ICD-10-CM | POA: Diagnosis not present

## 2015-04-16 DIAGNOSIS — M545 Low back pain, unspecified: Secondary | ICD-10-CM

## 2015-04-16 DIAGNOSIS — M9905 Segmental and somatic dysfunction of pelvic region: Secondary | ICD-10-CM

## 2015-04-16 DIAGNOSIS — M9903 Segmental and somatic dysfunction of lumbar region: Secondary | ICD-10-CM | POA: Diagnosis not present

## 2015-04-16 DIAGNOSIS — E079 Disorder of thyroid, unspecified: Secondary | ICD-10-CM | POA: Diagnosis not present

## 2015-04-16 DIAGNOSIS — M9901 Segmental and somatic dysfunction of cervical region: Secondary | ICD-10-CM | POA: Diagnosis not present

## 2015-04-16 DIAGNOSIS — M1990 Unspecified osteoarthritis, unspecified site: Secondary | ICD-10-CM | POA: Diagnosis not present

## 2015-04-16 DIAGNOSIS — M9906 Segmental and somatic dysfunction of lower extremity: Secondary | ICD-10-CM

## 2015-04-16 DIAGNOSIS — M9904 Segmental and somatic dysfunction of sacral region: Secondary | ICD-10-CM | POA: Diagnosis not present

## 2015-04-16 DIAGNOSIS — Z1159 Encounter for screening for other viral diseases: Secondary | ICD-10-CM | POA: Diagnosis not present

## 2015-04-16 DIAGNOSIS — M999 Biomechanical lesion, unspecified: Secondary | ICD-10-CM

## 2015-04-16 DIAGNOSIS — M9902 Segmental and somatic dysfunction of thoracic region: Secondary | ICD-10-CM | POA: Diagnosis not present

## 2015-04-16 DIAGNOSIS — M9909 Segmental and somatic dysfunction of abdomen and other regions: Secondary | ICD-10-CM | POA: Diagnosis not present

## 2015-04-16 DIAGNOSIS — M9900 Segmental and somatic dysfunction of head region: Secondary | ICD-10-CM

## 2015-04-16 NOTE — Assessment & Plan Note (Signed)
Possibly the cause of her hair loss and parethesias. Will check thyroid panel.
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
## 2015-04-17 ENCOUNTER — Telehealth: Payer: Self-pay | Admitting: Family Medicine

## 2015-04-17 DIAGNOSIS — R768 Other specified abnormal immunological findings in serum: Secondary | ICD-10-CM

## 2015-04-17 NOTE — Telephone Encounter (Signed)
Called and discussed lab results with patient. Slightly elevated rheumatoid factor. Referral to rheumatology made today.

## 2015-04-18 LAB — LYME AB/WESTERN BLOT REFLEX
LYME DISEASE AB, QUANT, IGM: <0.8 index (ref 0.00–0.79)
Lyme IgG/IgM Ab: <0.91 {ISR} (ref 0.00–0.90)

## 2015-04-18 LAB — RHEUMATOID FACTOR: RHEUMATOID FACTOR: 17.5 [IU]/mL — AB (ref 0.0–13.9)

## 2015-04-18 LAB — BABESIA MICROTI ANTIBODY PANEL
Babesia microti IgG: <1:10 {titer}
Babesia microti IgM: <1:10 {titer}

## 2015-04-18 LAB — THYROID PANEL WITH TSH
FREE THYROXINE INDEX: 1.7 (ref 1.2–4.9)
T3 Uptake Ratio: 23 % — ABNORMAL LOW (ref 24–39)
T4 TOTAL: 7.5 ug/dL (ref 4.5–12.0)
TSH: 0.855 u[IU]/mL (ref 0.450–4.500)

## 2015-04-18 LAB — SEDIMENTATION RATE: SED RATE: 9 mm/h (ref 0–40)

## 2015-04-18 LAB — HIV ANTIBODY (ROUTINE TESTING W REFLEX): HIV SCREEN 4TH GENERATION: NONREACTIVE

## 2015-04-18 LAB — ROCKY MTN SPOTTED FVR ABS PNL(IGG+IGM)
RMSF IgG: NEGATIVE
RMSF IgM: 0.46 index (ref 0.00–0.89)

## 2015-04-18 LAB — HEPATITIS C ANTIBODY: Hep C Virus Ab: <0.1 s/co ratio (ref 0.0–0.9)

## 2015-04-30 ENCOUNTER — Ambulatory Visit (INDEPENDENT_AMBULATORY_CARE_PROVIDER_SITE_OTHER): Payer: BC Managed Care – PPO | Admitting: Family Medicine

## 2015-04-30 ENCOUNTER — Encounter: Payer: Self-pay | Admitting: Family Medicine

## 2015-04-30 VITALS — BP 111/76 | HR 92 | Temp 98.1°F | Ht 64.4 in | Wt 206.0 lb

## 2015-04-30 DIAGNOSIS — M9906 Segmental and somatic dysfunction of lower extremity: Secondary | ICD-10-CM | POA: Diagnosis not present

## 2015-04-30 DIAGNOSIS — M545 Low back pain, unspecified: Secondary | ICD-10-CM

## 2015-04-30 DIAGNOSIS — M9904 Segmental and somatic dysfunction of sacral region: Secondary | ICD-10-CM

## 2015-04-30 DIAGNOSIS — M9905 Segmental and somatic dysfunction of pelvic region: Secondary | ICD-10-CM

## 2015-04-30 DIAGNOSIS — M9909 Segmental and somatic dysfunction of abdomen and other regions: Secondary | ICD-10-CM

## 2015-04-30 DIAGNOSIS — M9903 Segmental and somatic dysfunction of lumbar region: Secondary | ICD-10-CM | POA: Diagnosis not present

## 2015-04-30 DIAGNOSIS — M9902 Segmental and somatic dysfunction of thoracic region: Secondary | ICD-10-CM | POA: Diagnosis not present
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
## 2015-05-01 ENCOUNTER — Encounter: Payer: Self-pay | Admitting: Family Medicine

## 2015-05-03 ENCOUNTER — Other Ambulatory Visit: Payer: Self-pay | Admitting: Family Medicine

## 2015-05-14 ENCOUNTER — Ambulatory Visit: Payer: BC Managed Care – PPO | Admitting: Family Medicine

## 2015-05-18 ENCOUNTER — Ambulatory Visit (INDEPENDENT_AMBULATORY_CARE_PROVIDER_SITE_OTHER): Payer: BC Managed Care – PPO | Admitting: Family Medicine

## 2015-05-18 ENCOUNTER — Encounter: Payer: Self-pay | Admitting: Family Medicine

## 2015-05-18 VITALS — BP 133/79 | HR 106 | Temp 97.9°F | Ht 65.5 in | Wt 212.0 lb

## 2015-05-18 DIAGNOSIS — M9904 Segmental and somatic dysfunction of sacral region: Secondary | ICD-10-CM

## 2015-05-18 DIAGNOSIS — M9905 Segmental and somatic dysfunction of pelvic region: Secondary | ICD-10-CM | POA: Diagnosis not present

## 2015-05-18 DIAGNOSIS — M545 Low back pain, unspecified: Secondary | ICD-10-CM

## 2015-05-18 DIAGNOSIS — M9900 Segmental and somatic dysfunction of head region: Secondary | ICD-10-CM | POA: Diagnosis not present

## 2015-05-18 DIAGNOSIS — M9901 Segmental and somatic dysfunction of cervical region: Secondary | ICD-10-CM | POA: Diagnosis not present

## 2015-05-18 DIAGNOSIS — M9909 Segmental and somatic dysfunction of abdomen and other regions: Secondary | ICD-10-CM | POA: Diagnosis not present

## 2015-05-18 DIAGNOSIS — R0683 Snoring: Secondary | ICD-10-CM

## 2015-05-18 DIAGNOSIS — M9902 Segmental and somatic dysfunction of thoracic region: Secondary | ICD-10-CM

## 2015-05-18 DIAGNOSIS — M9903 Segmental and somatic dysfunction of lumbar region: Secondary | ICD-10-CM

## 2015-05-18 DIAGNOSIS — M9906 Segmental and somatic dysfunction of lower extremity: Secondary | ICD-10-CM

## 2015-05-18 MED ORDER — METFORMIN HCL 500 MG PO TABS: 500.0000 mg | ORAL_TABLET | Freq: Two times a day (BID) | ORAL | Status: DC

## 2015-05-18 MED ORDER — CYCLOBENZAPRINE HCL 10 MG PO TABS: 10.0000 mg | ORAL_TABLET | Freq: Every day | ORAL | Status: DC
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
## 2015-05-21 ENCOUNTER — Encounter: Payer: Self-pay | Admitting: Family Medicine

## 2015-05-27 ENCOUNTER — Encounter: Payer: Self-pay | Admitting: Family Medicine

## 2015-05-27 NOTE — Telephone Encounter (Signed)
Forward to provider

## 2015-06-01 ENCOUNTER — Ambulatory Visit
Admission: RE | Admit: 2015-06-01 | Discharge: 2015-06-01 | Disposition: A | Discharge status: Home / Self Care | Payer: BC Managed Care – PPO | Source: Ambulatory Visit | Attending: Family Medicine | Admitting: Family Medicine

## 2015-06-01 ENCOUNTER — Ambulatory Visit (INDEPENDENT_AMBULATORY_CARE_PROVIDER_SITE_OTHER): Payer: BC Managed Care – PPO | Admitting: Family Medicine

## 2015-06-01 ENCOUNTER — Encounter: Payer: Self-pay | Admitting: Family Medicine

## 2015-06-01 ENCOUNTER — Telehealth: Payer: Self-pay | Admitting: Family Medicine

## 2015-06-01 VITALS — BP 137/84 | HR 100 | Temp 98.5°F | Ht 64.5 in | Wt 210.0 lb

## 2015-06-01 DIAGNOSIS — M9908 Segmental and somatic dysfunction of rib cage: Secondary | ICD-10-CM | POA: Diagnosis not present

## 2015-06-01 DIAGNOSIS — M9904 Segmental and somatic dysfunction of sacral region: Secondary | ICD-10-CM

## 2015-06-01 DIAGNOSIS — G540 Brachial plexus disorders: Secondary | ICD-10-CM

## 2015-06-01 DIAGNOSIS — M9909 Segmental and somatic dysfunction of abdomen and other regions: Secondary | ICD-10-CM | POA: Diagnosis not present

## 2015-06-01 DIAGNOSIS — M9901 Segmental and somatic dysfunction of cervical region: Secondary | ICD-10-CM | POA: Diagnosis not present

## 2015-06-01 DIAGNOSIS — M9907 Segmental and somatic dysfunction of upper extremity: Secondary | ICD-10-CM

## 2015-06-01 DIAGNOSIS — M545 Low back pain, unspecified: Secondary | ICD-10-CM

## 2015-06-01 DIAGNOSIS — M9906 Segmental and somatic dysfunction of lower extremity: Secondary | ICD-10-CM

## 2015-06-01 DIAGNOSIS — M9905 Segmental and somatic dysfunction of pelvic region: Secondary | ICD-10-CM

## 2015-06-01 DIAGNOSIS — M9903 Segmental and somatic dysfunction of lumbar region: Secondary | ICD-10-CM
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
## 2015-06-01 NOTE — Telephone Encounter (Signed)
Patient notified
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
## 2015-06-01 NOTE — Telephone Encounter (Signed)
Please let her know that her x-ray shows arthritis, but no change from prior. PT should help, so let's see how it goes.

## 2015-06-01 NOTE — Assessment & Plan Note (Signed)
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 Has not been stretching these, has been working on her hands, Stretches given today. PT referral also given today. Continue to monitor. She does have some somatic dysfunction that I think is contributing to her symptoms. I think she would benefit from OMT. Patient treated today with good results as discussed below.

## 2015-06-22 ENCOUNTER — Encounter: Payer: Self-pay | Admitting: Family Medicine

## 2015-06-22 ENCOUNTER — Ambulatory Visit (INDEPENDENT_AMBULATORY_CARE_PROVIDER_SITE_OTHER): Payer: BC Managed Care – PPO | Admitting: Family Medicine

## 2015-06-22 ENCOUNTER — Ambulatory Visit: Payer: BC Managed Care – PPO | Admitting: Family Medicine

## 2015-06-22 VITALS — BP 133/82 | HR 107 | Temp 97.2°F | Ht 65.0 in | Wt 213.0 lb

## 2015-06-22 DIAGNOSIS — M9901 Segmental and somatic dysfunction of cervical region: Secondary | ICD-10-CM | POA: Diagnosis not present

## 2015-06-22 DIAGNOSIS — M545 Low back pain, unspecified: Secondary | ICD-10-CM

## 2015-06-22 DIAGNOSIS — M9902 Segmental and somatic dysfunction of thoracic region: Secondary | ICD-10-CM

## 2015-06-22 DIAGNOSIS — M9906 Segmental and somatic dysfunction of lower extremity: Secondary | ICD-10-CM | POA: Diagnosis not present

## 2015-06-22 DIAGNOSIS — M9903 Segmental and somatic dysfunction of lumbar region: Secondary | ICD-10-CM

## 2015-06-22 DIAGNOSIS — M9908 Segmental and somatic dysfunction of rib cage: Secondary | ICD-10-CM

## 2015-06-22 DIAGNOSIS — M9904 Segmental and somatic dysfunction of sacral region: Secondary | ICD-10-CM

## 2015-06-22 DIAGNOSIS — M9909 Segmental and somatic dysfunction of abdomen and other regions: Secondary | ICD-10-CM | POA: Diagnosis not present

## 2015-06-22 DIAGNOSIS — M9905 Segmental and somatic dysfunction of pelvic region: Secondary | ICD-10-CM | POA: Diagnosis not present

## 2015-06-22 NOTE — Progress Notes (Signed)
BP 133/82 mmHg  Pulse 107  Temp(Src) 97.2 F (36.2 C)  Ht 5\' 5"  (1.651 m)  Wt 213 lb (96.616 kg)  BMI 35.44 kg/m2  SpO2 98%  LMP  (LMP Unknown)   Subjective:    Patient ID: Tracie Sheppard, female    DOB: Aug 19, 1955, 60 y.o.   MRN: [id number]  HPI: Tracie Sheppard is a 60 y.o. female  Chief Complaint  Patient presents with  . OMM   Meilah comes in today for evaluation of L sided low back pain. She doesn't remember doing anything, but it started about 3-4 days ago. Not sure if the dog pulled her. Pain is aching and located over 12th rib. No numbness or tingling. Radiating into her belly. Low back pain still acting up. Has not seen PT yet due to some issues with finances. Pain is better with rest and OMT. Worse with exercise and moving certain ways. She has otherwise been feeling well. Has been under a lot more stress recently and thinks that has been contributing. Not sick with URI or coughing. Has been worried about her husband. Otherwise doing well with no other concerns or complaints at this time.   Relevant past medical, surgical, family and social history reviewed and updated as indicated. Interim medical history since our last visit reviewed. Allergies and medications reviewed and updated.  Review of Systems  Constitutional: Negative.   Respiratory: Negative.   Cardiovascular: Negative.   Musculoskeletal: Positive for myalgias and back pain. Negative for joint swelling, arthralgias, gait problem, neck pain and neck stiffness.  Skin: Negative.   Psychiatric/Behavioral: Negative.     Per HPI unless specifically indicated above     Objective:    BP 133/82 mmHg  Pulse 107  Temp(Src) 97.2 F (36.2 C)  Ht 5\' 5"  (1.651 m)  Wt 213 lb (96.616 kg)  BMI 35.44 kg/m2  SpO2 98%  LMP  (LMP Unknown)  Wt Readings from Last 3 Encounters:  06/22/15 213 lb (96.616 kg)  06/01/15 210 lb (95.255 kg)  05/18/15 212 lb (96.163 kg)    Physical Exam  Constitutional: She is oriented to  person, place, and time. She appears well-developed and well-nourished. No distress.  HENT:  Head: Normocephalic and atraumatic.  Right Ear: Hearing normal.  Left Ear: Hearing normal.  Nose: Nose normal.  Eyes: Conjunctivae and lids are normal. Right eye exhibits no discharge. Left eye exhibits no discharge. No scleral icterus.  Pulmonary/Chest: Effort normal. No respiratory distress.  Abdominal: Soft. She exhibits no distension and no mass. There is no tenderness. There is no rebound and no guarding.  Musculoskeletal: Normal range of motion.  Neurological: She is alert and oriented to person, place, and time.  Skin: Skin is warm, dry and intact. No rash noted. No erythema. No pallor.  Psychiatric: She has a normal mood and affect. Her speech is normal and behavior is normal. Judgment and thought content normal. Cognition and memory are normal.  Nursing note and vitals reviewed.  Musculoskeletal:  Exam found Decreased ROM, Tissue texture changes, Tenderness to palpation and Asymmetry of patient's  neck, thorax, ribs, lumbar, pelvis, sacrum, lower extremity and abdomen Osteopathic Structural Exam:   Neck: Posterior scalene hypertonic on the R with tenderpoint  Thorax: T8-9SLRR  Ribs: 12th rib locked up  Lumbar:  L2-5SRRL  Pelvis: anterior rotated R innominate, SI joint restricted on the R  Sacrum: R on R torsion  Lower Extremity: posterior fibular head on R, 5th metatarsal restricted, fascial drag up R  leg into plevis  Abdomen: diaphragm hypertonicity bilaterally R>L, pelvic diaphragm with fascial drag into R innominate   Results for orders placed or performed in visit on 04/16/15  Thyroid Panel With TSH  Result Value Ref Range   TSH 0.855 0.450 - 4.500 uIU/mL   T4, Total 7.5 4.5 - 12.0 ug/dL   T3 Uptake Ratio 23 (L) 24 - 39 %   Free Thyroxine Index 1.7 1.2 - 4.9  Rheumatoid Factor  Result Value Ref Range   Rhuematoid fact SerPl-aCnc 17.5 (H) 0.0 - 13.9 IU/mL  Rocky mtn spotted  fvr abs pnl(IgG+IgM)  Result Value Ref Range   RMSF IgG Negative Negative   RMSF IgM 0.46 0.00 - 0.89 index  Babesia microti Antibody Panel  Result Value Ref Range   Babesia microti IgM <1:10 Neg:<1:10   Babesia microti IgG <1:10 Neg:<1:10  Lyme Ab/Western Blot Reflex  Result Value Ref Range   Lyme IgG/IgM Ab <0.91 0.00 - 0.90 ISR   LYME DISEASE AB, QUANT, IGM <0.80 0.00 - 0.79 index  Sedimentation rate  Result Value Ref Range   Sed Rate 9 0 - 40 mm/hr  Hepatitis C Antibody  Result Value Ref Range   Hep C Virus Ab <0.1 0.0 - 0.9 s/co ratio  HIV antibody  Result Value Ref Range   HIV Screen 4th Generation wRfx Non Reactive Non Reactive      Assessment & Plan:   Problem List Items Addressed This Visit      Other   Low back pain - Primary    Seems to be due to 12th rib dysfunction and diaphragm spasm. Would benefit from OMT. Treated today as discussed below with good results. Continue to monitor.        Other Visit Diagnoses    Lumbar region somatic dysfunction        Sacral region somatic dysfunction        Cervical somatic dysfunction        Somatic dysfunction of abdominal region        Lower limb region somatic dysfunction        Somatic dysfunction of pelvis region        Rib cage region somatic dysfunction        Somatic dysfunction of thoracic region          After verbal consent was obtained, patient was treated today with osteopathic manipulative medicine to the regions of the neck, thorax, ribs, lumbar, pelvis, sacrum, abdomen and lower extremity using the techniques of Still, myofascial release, counterstrain, muscle energy, HVLA and soft tissue. Areas of compensation relating to her primary pain source also treated. Patient tolerated the procedure well with good objective and good subjective improvement in symptoms. .sex left the room in good condition. He was advised to stay well hydrated and that he may have some soreness following the procedure. If not improving  or worsening, he will call and come in. She will return for reevaluation   in 2-3 weeks.   Follow up plan: Return 2-3 weeks, for OMM.

## 2015-06-23 NOTE — Assessment & Plan Note (Signed)
Seems to be due to 12th rib dysfunction and diaphragm spasm. Would benefit from OMT. Treated today as discussed below with good results. Continue to monitor.

## 2015-06-29 ENCOUNTER — Other Ambulatory Visit: Payer: Self-pay | Admitting: Family Medicine

## 2015-07-06 ENCOUNTER — Ambulatory Visit: Payer: BC Managed Care – PPO | Admitting: Family Medicine

## 2015-07-10 ENCOUNTER — Ambulatory Visit (INDEPENDENT_AMBULATORY_CARE_PROVIDER_SITE_OTHER): Payer: BC Managed Care – PPO | Admitting: Family Medicine

## 2015-07-10 ENCOUNTER — Encounter: Payer: Self-pay | Admitting: Family Medicine

## 2015-07-10 VITALS — BP 138/80 | HR 107 | Temp 97.7°F | Ht 64.2 in | Wt 208.0 lb

## 2015-07-10 DIAGNOSIS — M545 Low back pain, unspecified: Secondary | ICD-10-CM

## 2015-07-10 DIAGNOSIS — M9905 Segmental and somatic dysfunction of pelvic region: Secondary | ICD-10-CM

## 2015-07-10 DIAGNOSIS — M9909 Segmental and somatic dysfunction of abdomen and other regions: Secondary | ICD-10-CM

## 2015-07-10 DIAGNOSIS — M9904 Segmental and somatic dysfunction of sacral region: Secondary | ICD-10-CM | POA: Diagnosis not present

## 2015-07-10 DIAGNOSIS — M9908 Segmental and somatic dysfunction of rib cage: Secondary | ICD-10-CM

## 2015-07-10 DIAGNOSIS — M9901 Segmental and somatic dysfunction of cervical region: Secondary | ICD-10-CM | POA: Diagnosis not present

## 2015-07-10 DIAGNOSIS — M9903 Segmental and somatic dysfunction of lumbar region: Secondary | ICD-10-CM

## 2015-07-10 DIAGNOSIS — M9900 Segmental and somatic dysfunction of head region: Secondary | ICD-10-CM | POA: Diagnosis not present
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
## 2015-07-30 ENCOUNTER — Ambulatory Visit (INDEPENDENT_AMBULATORY_CARE_PROVIDER_SITE_OTHER): Payer: BC Managed Care – PPO | Admitting: Family Medicine

## 2015-07-30 ENCOUNTER — Encounter: Payer: Self-pay | Admitting: Family Medicine

## 2015-07-30 VITALS — BP 111/74 | HR 82 | Temp 98.1°F | Ht 65.0 in | Wt 207.0 lb

## 2015-07-30 DIAGNOSIS — R100 Acute abdomen: Secondary | ICD-10-CM | POA: Diagnosis not present

## 2015-07-30 DIAGNOSIS — R109 Unspecified abdominal pain: Secondary | ICD-10-CM

## 2015-07-30 LAB — URINALYSIS, ROUTINE W REFLEX MICROSCOPIC
Bilirubin, UA: NEGATIVE
GLUCOSE, UA: NEGATIVE
Ketones, UA: NEGATIVE
Leukocytes, UA: NEGATIVE
NITRITE UA: POSITIVE — AB
SPEC GRAV UA: 1.02 (ref 1.005–1.030)
Urobilinogen, Ur: 1 mg/dL (ref 0.2–1.0)
pH, UA: 7 (ref 5.0–7.5)

## 2015-07-30 LAB — MICROSCOPIC EXAMINATION: RBC, UA: >30 /hpf — AB (ref 0–?)

## 2015-07-30 MED ORDER — CIPROFLOXACIN HCL 250 MG PO TABS: 250.0000 mg | ORAL_TABLET | Freq: Two times a day (BID) | ORAL | Status: DC

## 2015-07-30 MED ORDER — TAMSULOSIN HCL 0.4 MG PO CAPS: 0.4000 mg | ORAL_CAPSULE | Freq: Every day | ORAL | Status: DC

## 2015-07-30 NOTE — Progress Notes (Signed)
BP 111/74 mmHg  Pulse 82  Temp(Src) 98.1 F (36.7 C)  Ht 5\' 5"  (1.651 m)  Wt 207 lb (93.895 kg)  BMI 34.45 kg/m2  SpO2 96%  LMP  (LMP Unknown)   Subjective:    Patient ID: Tracie Sheppard, female    DOB: 04-02-1956, 60 y.o.   MRN: [id number]  HPI: Tracie Sheppard is a 60 y.o. female  Chief Complaint  Patient presents with  . Urinary Tract Infection   Patient was some frequency urgency dysuria also has noted some blood in her urine is concerned because of history of kidney stones he year ago. Reviewed x-ray reports ultrasound CT patient's stones were tiny 1-2 mm apparently resolved with no further symptoms until couple weeks ago. No fever or chills no nausea vomiting  Relevant past medical, surgical, family and social history reviewed and updated as indicated. Interim medical history since our last visit reviewed. Allergies and medications reviewed and updated.  Review of Systems  Constitutional: Negative.   Respiratory: Negative.   Cardiovascular: Negative.     Per HPI unless specifically indicated above     Objective:    BP 111/74 mmHg  Pulse 82  Temp(Src) 98.1 F (36.7 C)  Ht 5\' 5"  (1.651 m)  Wt 207 lb (93.895 kg)  BMI 34.45 kg/m2  SpO2 96%  LMP  (LMP Unknown)  Wt Readings from Last 3 Encounters:  07/30/15 207 lb (93.895 kg)  07/10/15 208 lb (94.348 kg)  06/22/15 213 lb (96.616 kg)    Physical Exam  Constitutional: She is oriented to person, place, and time. She appears well-developed and well-nourished. No distress.  HENT:  Head: Normocephalic and atraumatic.  Right Ear: Hearing normal.  Left Ear: Hearing normal.  Nose: Nose normal.  Eyes: Conjunctivae and lids are normal. Right eye exhibits no discharge. Left eye exhibits no discharge. No scleral icterus.  Cardiovascular: Normal rate, regular rhythm and normal heart sounds.   Pulmonary/Chest: Effort normal and breath sounds normal. No respiratory distress.  Abdominal: Soft. Bowel sounds are normal.  She exhibits no distension. There is no tenderness. There is no rebound and no guarding.  Musculoskeletal: Normal range of motion.  Neurological: She is alert and oriented to person, place, and time.  Skin: Skin is intact. No rash noted.  Psychiatric: She has a normal mood and affect. Her speech is normal and behavior is normal. Judgment and thought content normal. Cognition and memory are normal.    Results for orders placed or performed in visit on 04/16/15  Thyroid Panel With TSH  Result Value Ref Range   TSH 0.855 0.450 - 4.500 uIU/mL   T4, Total 7.5 4.5 - 12.0 ug/dL   T3 Uptake Ratio 23 (L) 24 - 39 %   Free Thyroxine Index 1.7 1.2 - 4.9  Rheumatoid Factor  Result Value Ref Range   Rhuematoid fact SerPl-aCnc 17.5 (H) 0.0 - 13.9 IU/mL  Rocky mtn spotted fvr abs pnl(IgG+IgM)  Result Value Ref Range   RMSF IgG Negative Negative   RMSF IgM 0.46 0.00 - 0.89 index  Babesia microti Antibody Panel  Result Value Ref Range   Babesia microti IgM <1:10 Neg:<1:10   Babesia microti IgG <1:10 Neg:<1:10  Lyme Ab/Western Blot Reflex  Result Value Ref Range   Lyme IgG/IgM Ab <0.91 0.00 - 0.90 ISR   LYME DISEASE AB, QUANT, IGM <0.80 0.00 - 0.79 index  Sedimentation rate  Result Value Ref Range   Sed Rate 9 0 - 40 mm/hr  Hepatitis C Antibody  Result Value Ref Range   Hep C Virus Ab <0.1 0.0 - 0.9 s/co ratio  HIV antibody  Result Value Ref Range   HIV Screen 4th Generation wRfx Non Reactive Non Reactive      Assessment & Plan:   Problem List Items Addressed This Visit    None    Visit Diagnoses    Abdominal pain, acute    -  Primary    UTI, will do culture discussed possibility of kidney stones recheck urine 1 week to assess for blood. Patient education on fluids etc.    Relevant Medications    ciprofloxacin (CIPRO) 250 MG tablet    tamsulosin (FLOMAX) 0.4 MG CAPS capsule    Other Relevant Orders    Urinalysis, Routine w reflex microscopic (not at [facility])    Urinalysis, Routine w  reflex microscopic (not at [facility])    Urine culture        Follow up plan: Return for As scheduled.

## 2015-08-01 LAB — URINE CULTURE

## 2015-08-03 ENCOUNTER — Encounter: Payer: Self-pay | Admitting: Family Medicine

## 2015-08-03 ENCOUNTER — Ambulatory Visit (INDEPENDENT_AMBULATORY_CARE_PROVIDER_SITE_OTHER): Payer: BC Managed Care – PPO | Admitting: Family Medicine

## 2015-08-03 VITALS — BP 121/78 | HR 91 | Temp 97.8°F | Wt 205.0 lb

## 2015-08-03 DIAGNOSIS — M9909 Segmental and somatic dysfunction of abdomen and other regions: Secondary | ICD-10-CM | POA: Diagnosis not present

## 2015-08-03 DIAGNOSIS — N3001 Acute cystitis with hematuria: Secondary | ICD-10-CM

## 2015-08-03 DIAGNOSIS — M9905 Segmental and somatic dysfunction of pelvic region: Secondary | ICD-10-CM | POA: Diagnosis not present

## 2015-08-03 DIAGNOSIS — M9903 Segmental and somatic dysfunction of lumbar region: Secondary | ICD-10-CM

## 2015-08-03 DIAGNOSIS — M9908 Segmental and somatic dysfunction of rib cage: Secondary | ICD-10-CM

## 2015-08-03 DIAGNOSIS — M545 Low back pain, unspecified: Secondary | ICD-10-CM

## 2015-08-03 DIAGNOSIS — M9904 Segmental and somatic dysfunction of sacral region: Secondary | ICD-10-CM | POA: Diagnosis not present

## 2015-08-03 DIAGNOSIS — M9906 Segmental and somatic dysfunction of lower extremity: Secondary | ICD-10-CM | POA: Diagnosis not present

## 2015-08-03 MED ORDER — CIPROFLOXACIN HCL 250 MG PO TABS: 250.0000 mg | ORAL_TABLET | Freq: Two times a day (BID) | ORAL | Status: DC
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
## 2015-08-24 ENCOUNTER — Ambulatory Visit (INDEPENDENT_AMBULATORY_CARE_PROVIDER_SITE_OTHER): Payer: BC Managed Care – PPO | Admitting: Family Medicine

## 2015-08-24 ENCOUNTER — Encounter: Payer: Self-pay | Admitting: Family Medicine

## 2015-08-24 VITALS — BP 122/79 | HR 81 | Temp 97.6°F | Ht 65.25 in | Wt 204.2 lb

## 2015-08-24 DIAGNOSIS — F419 Anxiety disorder, unspecified: Secondary | ICD-10-CM

## 2015-08-24 DIAGNOSIS — E785 Hyperlipidemia, unspecified: Secondary | ICD-10-CM

## 2015-08-24 DIAGNOSIS — R319 Hematuria, unspecified: Secondary | ICD-10-CM

## 2015-08-24 DIAGNOSIS — E079 Disorder of thyroid, unspecified: Secondary | ICD-10-CM | POA: Diagnosis not present

## 2015-08-24 DIAGNOSIS — R7301 Impaired fasting glucose: Secondary | ICD-10-CM

## 2015-08-24 LAB — UA/M W/RFLX CULTURE, ROUTINE
BILIRUBIN UA: NEGATIVE
Glucose, UA: NEGATIVE
KETONES UA: NEGATIVE
Leukocytes, UA: NEGATIVE
NITRITE UA: NEGATIVE
Protein, UA: NEGATIVE
SPEC GRAV UA: 1.02 (ref 1.005–1.030)
UUROB: 0.2 mg/dL (ref 0.2–1.0)
pH, UA: 5.5 (ref 5.0–7.5)

## 2015-08-24 LAB — MICROSCOPIC EXAMINATION

## 2015-08-24 NOTE — Assessment & Plan Note (Signed)
Rechecking UA today, if continues with blood in her urine, will send to urology for evaluation.

## 2015-08-24 NOTE — Assessment & Plan Note (Signed)
Checking labs today. Await results.  

## 2015-08-24 NOTE — Assessment & Plan Note (Signed)
On red yeast rice and fish oil. Not interested in statins. Await results.

## 2015-08-24 NOTE — Assessment & Plan Note (Signed)
Due for recheck and much more anxious. Checking labs today, await results.

## 2015-08-24 NOTE — Assessment & Plan Note (Signed)
Not under good control. Not interested in increasing her dose of celexa at this time. Will consider it. OK to take 1 tab of her klonopin as needed. Call with any concerns. Recheck 2-3 weeks, if still not doing well will increase celexa and refill klonopin.

## 2015-08-24 NOTE — Progress Notes (Signed)
BP 122/79 mmHg  Pulse 81  Temp(Src) 97.6 F (36.4 C)  Ht 5' 5.25" (1.657 m)  Wt 204 lb 3.2 oz (92.625 kg)  BMI 33.74 kg/m2  SpO2 96%  LMP  (LMP Unknown)   Subjective:    Patient ID: Tracie Sheppard, female    DOB: 1956-01-22, 60 y.o.   MRN: [id number]  HPI: Tracie Sheppard is a 60 y.o. female  Chief Complaint  Patient presents with  . Anxiety   ANXIETY/STRESS- daughter havign troubles in her marriage, and she is being torn apart by that and needing to take her klonopin (1/2 tab) daily Duration:exacerbated Anxious mood: yes  Excessive worrying: yes Irritability: yes  Sweating: yes Nausea: yes Palpitations:yes Hyperventilation: no Panic attacks: no Agoraphobia: yes  Obscessions/compulsions: yes Depressed mood: yes Anhedonia: no Weight changes: no Insomnia: no   Hypersomnia: no Fatigue/loss of energy: yes Feelings of worthlessness: yes Feelings of guilt: yes Impaired concentration/indecisiveness: yes Suicidal ideations: no  Crying spells: yes Recent Stressors/Life Changes: yes   Relationship problems: no   Family stress: yes     Financial stress: no    Job stress: no    Recent death/loss: no  Relevant past medical, surgical, family and social history reviewed and updated as indicated. Interim medical history since our last visit reviewed. Allergies and medications reviewed and updated.  Review of Systems  Constitutional: Negative.   Respiratory: Negative.   Cardiovascular: Negative.   Musculoskeletal: Negative.   Psychiatric/Behavioral: Positive for dysphoric mood and agitation. Negative for suicidal ideas, hallucinations, behavioral problems, confusion, sleep disturbance, self-injury and decreased concentration. The patient is nervous/anxious. The patient is not hyperactive.     Per HPI unless specifically indicated above     Objective:    BP 122/79 mmHg  Pulse 81  Temp(Src) 97.6 F (36.4 C)  Ht 5' 5.25" (1.657 m)  Wt 204 lb 3.2 oz (92.625 kg)  BMI  33.74 kg/m2  SpO2 96%  LMP  (LMP Unknown)  Wt Readings from Last 3 Encounters:  08/24/15 204 lb 3.2 oz (92.625 kg)  08/03/15 205 lb (92.987 kg)  07/30/15 207 lb (93.895 kg)    Physical Exam  Constitutional: She is oriented to person, place, and time. She appears well-developed and well-nourished. No distress.  HENT:  Head: Normocephalic and atraumatic.  Right Ear: Hearing normal.  Left Ear: Hearing normal.  Nose: Nose normal.  Eyes: Conjunctivae and lids are normal. Right eye exhibits no discharge. Left eye exhibits no discharge. No scleral icterus.  Cardiovascular: Normal rate, regular rhythm, normal heart sounds and intact distal pulses.  Exam reveals no gallop and no friction rub.   No murmur heard. Pulmonary/Chest: Effort normal and breath sounds normal. No respiratory distress. She has no wheezes. She has no rales. She exhibits no tenderness.  Abdominal: Soft.  Musculoskeletal: Normal range of motion.  Neurological: She is alert and oriented to person, place, and time.  Skin: Skin is warm, dry and intact. No rash noted. No erythema. No pallor.  Psychiatric: She has a normal mood and affect. Her speech is normal and behavior is normal. Judgment and thought content normal. Cognition and memory are normal.  Nursing note and vitals reviewed.   Results for orders placed or performed in visit on 08/24/15  Microscopic Examination  Result Value Ref Range   WBC, UA 0-5 0 -  5 /hpf   RBC, UA 0-2 0 -  2 /hpf   Epithelial Cells (non renal) 0-10 0 - 10 /hpf   Mucus,  UA Present Not Estab.   Bacteria, UA Few None seen/Few  UA/M w/rflx Culture, Routine  Result Value Ref Range   Specific Gravity, UA 1.020 1.005 - 1.030   pH, UA 5.5 5.0 - 7.5   Color, UA Yellow Yellow   Appearance Ur Clear Clear   Leukocytes, UA Negative Negative   Protein, UA Negative Negative/Trace   Glucose, UA Negative Negative   Ketones, UA Negative Negative   RBC, UA Trace (A) Negative   Bilirubin, UA Negative  Negative   Urobilinogen, Ur 0.2 0.2 - 1.0 mg/dL   Nitrite, UA Negative Negative   Microscopic Examination See below:       Assessment & Plan:   Problem List Items Addressed This Visit      Endocrine   Thyroid disease    Due for recheck and much more anxious. Checking labs today, await results.       Relevant Orders   TSH   CBC with Differential/Platelet   IFG (impaired fasting glucose)    Checking labs today. Await results.       Relevant Orders   Comprehensive metabolic panel   CBC with Differential/Platelet   Hgb A1c w/o eAG     Other   Hematuria - Primary    Rechecking UA today, if continues with blood in her urine, will send to urology for evaluation.      Relevant Orders   UA/M w/rflx Culture, Routine (Completed)   CBC with Differential/Platelet   Acute anxiety    Not under good control. Not interested in increasing her dose of celexa at this time. Will consider it. OK to take 1 tab of her klonopin as needed. Call with any concerns. Recheck 2-3 weeks, if still not doing well will increase celexa and refill klonopin.       Hyperlipidemia    On red yeast rice and fish oil. Not interested in statins. Await results.       Relevant Orders   CBC with Differential/Platelet   Lipid Panel w/o Chol/HDL Ratio       Follow up plan: Return 2-3 weeks for follow up  mood  and OMM.

## 2015-08-25 ENCOUNTER — Encounter: Payer: Self-pay | Admitting: Family Medicine

## 2015-08-25 LAB — CBC WITH DIFFERENTIAL/PLATELET
BASOS: 0 %
Basophils Absolute: 0 10*3/uL (ref 0.0–0.2)
EOS (ABSOLUTE): 0.1 10*3/uL (ref 0.0–0.4)
EOS: 1 %
Hematocrit: 37.3 % (ref 34.0–46.6)
Hemoglobin: 12.3 g/dL (ref 11.1–15.9)
IMMATURE GRANS (ABS): 0 10*3/uL (ref 0.0–0.1)
IMMATURE GRANULOCYTES: 0 %
Lymphocytes Absolute: 3.5 10*3/uL — ABNORMAL HIGH (ref 0.7–3.1)
Lymphs: 49 %
MCH: 28.8 pg (ref 26.6–33.0)
MCHC: 33 g/dL (ref 31.5–35.7)
MCV: 87 fL (ref 79–97)
MONOCYTES: 7 %
MONOS ABS: 0.5 10*3/uL (ref 0.1–0.9)
NEUTROS PCT: 43 %
Neutrophils Absolute: 3.1 10*3/uL (ref 1.4–7.0)
PLATELETS: 252 10*3/uL (ref 150–379)
RBC: 4.27 x10E6/uL (ref 3.77–5.28)
RDW: 12.9 % (ref 12.3–15.4)
WBC: 7.2 10*3/uL (ref 3.4–10.8)

## 2015-08-25 LAB — COMPREHENSIVE METABOLIC PANEL
ALBUMIN: 4.3 g/dL (ref 3.5–5.5)
ALT: 68 IU/L — ABNORMAL HIGH (ref 0–32)
AST: 38 IU/L (ref 0–40)
Albumin/Globulin Ratio: 1.7 (ref 1.2–2.2)
Alkaline Phosphatase: 66 IU/L (ref 39–117)
BUN / CREAT RATIO: 16 (ref 9–23)
BUN: 15 mg/dL (ref 6–24)
Bilirubin Total: 0.9 mg/dL (ref 0.0–1.2)
CALCIUM: 9.4 mg/dL (ref 8.7–10.2)
CO2: 21 mmol/L (ref 18–29)
CREATININE: 0.92 mg/dL (ref 0.57–1.00)
Chloride: 105 mmol/L (ref 96–106)
GFR calc Af Amer: 80 mL/min/{1.73_m2} (ref >59)
GFR, EST NON AFRICAN AMERICAN: 69 mL/min/{1.73_m2} (ref >59)
GLOBULIN, TOTAL: 2.6 g/dL (ref 1.5–4.5)
Glucose: 93 mg/dL (ref 65–99)
Potassium: 4.5 mmol/L (ref 3.5–5.2)
SODIUM: 142 mmol/L (ref 134–144)
Total Protein: 6.9 g/dL (ref 6.0–8.5)

## 2015-08-25 LAB — LIPID PANEL W/O CHOL/HDL RATIO
CHOLESTEROL TOTAL: 196 mg/dL (ref 100–199)
HDL: 53 mg/dL (ref >39)
LDL Calculated: 131 mg/dL — ABNORMAL HIGH (ref 0–99)
TRIGLYCERIDES: 60 mg/dL (ref 0–149)
VLDL Cholesterol Cal: 12 mg/dL (ref 5–40)

## 2015-08-25 LAB — HGB A1C W/O EAG: HEMOGLOBIN A1C: 6.1 % — AB (ref 4.8–5.6)

## 2015-08-25 LAB — TSH: TSH: 0.676 u[IU]/mL (ref 0.450–4.500)

## 2015-08-28 ENCOUNTER — Other Ambulatory Visit: Payer: Self-pay | Admitting: Family Medicine

## 2015-09-14 ENCOUNTER — Ambulatory Visit (INDEPENDENT_AMBULATORY_CARE_PROVIDER_SITE_OTHER): Payer: BC Managed Care – PPO | Admitting: Family Medicine

## 2015-09-14 ENCOUNTER — Encounter: Payer: Self-pay | Admitting: Family Medicine

## 2015-09-14 VITALS — BP 120/75 | HR 76 | Temp 97.9°F | Wt 201.0 lb

## 2015-09-14 DIAGNOSIS — M9909 Segmental and somatic dysfunction of abdomen and other regions: Secondary | ICD-10-CM

## 2015-09-14 DIAGNOSIS — M9905 Segmental and somatic dysfunction of pelvic region: Secondary | ICD-10-CM | POA: Diagnosis not present

## 2015-09-14 DIAGNOSIS — M9903 Segmental and somatic dysfunction of lumbar region: Secondary | ICD-10-CM

## 2015-09-14 DIAGNOSIS — M9900 Segmental and somatic dysfunction of head region: Secondary | ICD-10-CM | POA: Diagnosis not present

## 2015-09-14 DIAGNOSIS — M9906 Segmental and somatic dysfunction of lower extremity: Secondary | ICD-10-CM

## 2015-09-14 DIAGNOSIS — M9904 Segmental and somatic dysfunction of sacral region: Secondary | ICD-10-CM | POA: Diagnosis not present

## 2015-09-14 DIAGNOSIS — M9901 Segmental and somatic dysfunction of cervical region: Secondary | ICD-10-CM

## 2015-09-14 DIAGNOSIS — M545 Low back pain, unspecified: Secondary | ICD-10-CM

## 2015-09-14 DIAGNOSIS — M9908 Segmental and somatic dysfunction of rib cage: Secondary | ICD-10-CM | POA: Diagnosis not present

## 2015-09-14 MED ORDER — CLONAZEPAM 0.5 MG PO TABS: 0.5000 mg | ORAL_TABLET | Freq: Every day | ORAL | Status: DC | PRN

## 2015-09-14 NOTE — Progress Notes (Signed)
BP 120/75 mmHg  Pulse 76  Temp(Src) 97.9 F (36.6 C)  Wt 201 lb (91.173 kg)  SpO2 97%  LMP  (LMP Unknown)   Subjective:    Patient ID: Tracie Sheppard, female    DOB: March 07, 1956, 60 y.o.   MRN: [id number]  HPI: Tracie Sheppard is a 60 y.o. female  Chief Complaint  Patient presents with  . OMM   Tracie Sheppard is doing OK. She states that she was at a conference this past weekend and was was walking in heals the whole time. She has been stressed as her daughter left her husband and moved in with her with her son and her dog. She notes that her L hip is hurting as is her leg. She notes that it started a few days ago. It's worse with walking and better with rest. OMT helps as well and she would like to pursue that. It's deep and aching. Radiates into her low back and leg. She has otherwise been feeling well with no other concerns or complaints at this time.   Relevant past medical, surgical, family and social history reviewed and updated as indicated. Interim medical history since our last visit reviewed. Allergies and medications reviewed and updated.  Review of Systems  Constitutional: Negative.   Respiratory: Negative.   Cardiovascular: Negative.   Musculoskeletal: Positive for myalgias, back pain, arthralgias and gait problem. Negative for joint swelling, neck pain and neck stiffness.  Psychiatric/Behavioral: Negative.     Per HPI unless specifically indicated above     Objective:    BP 120/75 mmHg  Pulse 76  Temp(Src) 97.9 F (36.6 C)  Wt 201 lb (91.173 kg)  SpO2 97%  LMP  (LMP Unknown)  Wt Readings from Last 3 Encounters:  09/14/15 201 lb (91.173 kg)  08/24/15 204 lb 3.2 oz (92.625 kg)  08/03/15 205 lb (92.987 kg)    Physical Exam  Constitutional: She is oriented to person, place, and time. She appears well-developed and well-nourished. No distress.  HENT:  Head: Normocephalic and atraumatic.  Right Ear: Hearing normal.  Left Ear: Hearing normal.  Nose: Nose normal.   Eyes: Conjunctivae and lids are normal. Right eye exhibits no discharge. Left eye exhibits no discharge. No scleral icterus.  Pulmonary/Chest: Effort normal. No respiratory distress.  Abdominal: Soft. She exhibits no distension and no mass. There is no tenderness. There is no rebound and no guarding.  Neurological: She is alert and oriented to person, place, and time.  Skin: Skin is warm, dry and intact. No rash noted. No erythema. No pallor.  Psychiatric: She has a normal mood and affect. Her speech is normal and behavior is normal. Judgment and thought content normal. Cognition and memory are normal.  Nursing note and vitals reviewed. Musculoskeletal:  Exam found Decreased ROM, Tissue texture changes, Tenderness to palpation and Asymmetry of patient's  head, neck, ribs, lumbar, pelvis, sacrum, lower extremity and abdomen Osteopathic Structural Exam:   Head: OAESSR, OM suture restricted on the R, hypertonic suboccipital muscles  Neck: Trap spasm bilaterally R>L  Ribs: Ribs 8-10 locked up on the R  Lumbar: QL hypertonic on the L, psoas spasm on the L  Pelvis: Anterior L innominate, SI joint restricted on the L  Sacrum: L on L torsion  Lower Extremity: IT band hypertonic on the L, glut spasm on the L, Posterior fibular head on the L  Abdomen: diaphragm spasm on the L   Results for orders placed or performed in visit on 08/24/15  Microscopic Examination  Result Value Ref Range   WBC, UA 0-5 0 -  5 /hpf   RBC, UA 0-2 0 -  2 /hpf   Epithelial Cells (non renal) 0-10 0 - 10 /hpf   Mucus, UA Present Not Estab.   Bacteria, UA Few None seen/Few  TSH  Result Value Ref Range   TSH 0.676 0.450 - 4.500 uIU/mL  UA/M w/rflx Culture, Routine  Result Value Ref Range   Specific Gravity, UA 1.020 1.005 - 1.030   pH, UA 5.5 5.0 - 7.5   Color, UA Yellow Yellow   Appearance Ur Clear Clear   Leukocytes, UA Negative Negative   Protein, UA Negative Negative/Trace   Glucose, UA Negative Negative    Ketones, UA Negative Negative   RBC, UA Trace (A) Negative   Bilirubin, UA Negative Negative   Urobilinogen, Ur 0.2 0.2 - 1.0 mg/dL   Nitrite, UA Negative Negative   Microscopic Examination See below:   Comprehensive metabolic panel  Result Value Ref Range   Glucose 93 65 - 99 mg/dL   BUN 15 6 - 24 mg/dL   Creatinine, Ser 0.92 0.57 - 1.00 mg/dL   GFR calc non Af Amer 69 >59 mL/min/1.73   GFR calc Af Amer 80 >59 mL/min/1.73   BUN/Creatinine Ratio 16 9 - 23   Sodium 142 134 - 144 mmol/L   Potassium 4.5 3.5 - 5.2 mmol/L   Chloride 105 96 - 106 mmol/L   CO2 21 18 - 29 mmol/L   Calcium 9.4 8.7 - 10.2 mg/dL   Total Protein 6.9 6.0 - 8.5 g/dL   Albumin 4.3 3.5 - 5.5 g/dL   Globulin, Total 2.6 1.5 - 4.5 g/dL   Albumin/Globulin Ratio 1.7 1.2 - 2.2   Bilirubin Total 0.9 0.0 - 1.2 mg/dL   Alkaline Phosphatase 66 39 - 117 IU/L   AST 38 0 - 40 IU/L   ALT 68 (H) 0 - 32 IU/L  CBC with Differential/Platelet  Result Value Ref Range   WBC 7.2 3.4 - 10.8 x10E3/uL   RBC 4.27 3.77 - 5.28 x10E6/uL   Hemoglobin 12.3 11.1 - 15.9 g/dL   Hematocrit 37.3 34.0 - 46.6 %   MCV 87 79 - 97 fL   MCH 28.8 26.6 - 33.0 pg   MCHC 33.0 31.5 - 35.7 g/dL   RDW 12.9 12.3 - 15.4 %   Platelets 252 150 - 379 x10E3/uL   Neutrophils 43 %   Lymphs 49 %   Monocytes 7 %   Eos 1 %   Basos 0 %   Neutrophils Absolute 3.1 1.4 - 7.0 x10E3/uL   Lymphocytes Absolute 3.5 (H) 0.7 - 3.1 x10E3/uL   Monocytes Absolute 0.5 0.1 - 0.9 x10E3/uL   EOS (ABSOLUTE) 0.1 0.0 - 0.4 x10E3/uL   Basophils Absolute 0.0 0.0 - 0.2 x10E3/uL   Immature Granulocytes 0 %   Immature Grans (Abs) 0.0 0.0 - 0.1 x10E3/uL  Hgb A1c w/o eAG  Result Value Ref Range   Hgb A1c MFr Bld 6.1 (H) 4.8 - 5.6 %  Lipid Panel w/o Chol/HDL Ratio  Result Value Ref Range   Cholesterol, Total 196 100 - 199 mg/dL   Triglycerides 60 0 - 149 mg/dL   HDL 53 >39 mg/dL   VLDL Cholesterol Cal 12 5 - 40 mg/dL   LDL Calculated 131 (H) 0 - 99 mg/dL      Assessment &  Plan:   Problem List Items Addressed This Visit  Other   Low back pain - Primary   Relevant Medications   ibuprofen (GOODSENSE IBUPROFEN) 200 MG tablet    Other Visit Diagnoses    Somatic dysfunction of abdominal region        Lumbar region somatic dysfunction        Sacral region somatic dysfunction        Rib cage region somatic dysfunction        Cervical somatic dysfunction        Cranial somatic dysfunction        Lower limb region somatic dysfunction        Somatic dysfunction of pelvis region          After verbal consent was obtained, patient was treated today with osteopathic manipulative medicine to the regions of the head, neck, ribs, lumbar, pelvis, sacrum, abdomen and lower extremity using the techniques of cranial, Still, FPR, myofascial release, counterstrain, muscle energy, HVLA and soft tissue. Areas of compensation relating to her primary pain source also treated. Patient tolerated the procedure well with good objective and good subjective improvement in symptoms. She left the room in good condition. She was advised to stay well hydrated and that she may have some soreness following the procedure. If not improving or worsening, she will call and come in.  She will return for reevaluation  In 3-4 weeks.   Follow up plan: Return 3-4 weeks, for OMT eval.

## 2015-09-28 ENCOUNTER — Ambulatory Visit (INDEPENDENT_AMBULATORY_CARE_PROVIDER_SITE_OTHER): Payer: BC Managed Care – PPO | Admitting: Family Medicine

## 2015-09-28 ENCOUNTER — Encounter: Payer: Self-pay | Admitting: Family Medicine

## 2015-09-28 VITALS — BP 105/67 | HR 77 | Temp 97.8°F | Ht 65.25 in | Wt 201.0 lb

## 2015-09-28 DIAGNOSIS — M9906 Segmental and somatic dysfunction of lower extremity: Secondary | ICD-10-CM

## 2015-09-28 DIAGNOSIS — R0981 Nasal congestion: Secondary | ICD-10-CM | POA: Diagnosis not present

## 2015-09-28 DIAGNOSIS — M9908 Segmental and somatic dysfunction of rib cage: Secondary | ICD-10-CM

## 2015-09-28 DIAGNOSIS — M9904 Segmental and somatic dysfunction of sacral region: Secondary | ICD-10-CM

## 2015-09-28 DIAGNOSIS — M7671 Peroneal tendinitis, right leg: Secondary | ICD-10-CM | POA: Diagnosis not present

## 2015-09-28 DIAGNOSIS — M9905 Segmental and somatic dysfunction of pelvic region: Secondary | ICD-10-CM

## 2015-09-28 DIAGNOSIS — M9903 Segmental and somatic dysfunction of lumbar region: Secondary | ICD-10-CM | POA: Diagnosis not present

## 2015-09-28 DIAGNOSIS — M9909 Segmental and somatic dysfunction of abdomen and other regions: Secondary | ICD-10-CM | POA: Diagnosis not present

## 2015-09-28 MED ORDER — PREDNISONE 10 MG PO TABS
ORAL_TABLET | ORAL | Status: DC
Start: 2015-09-28 — End: 2015-11-09

## 2015-09-28 MED ORDER — FLUTICASONE PROPIONATE 50 MCG/ACT NA SUSP: NASAL | Status: DC

## 2015-09-28 NOTE — Assessment & Plan Note (Signed)
Acting up again because she's wearing flip flops. Encouraged proper shoe use. She does have some somatic dysfunction that seems to be contributing to her symptoms. I think she would benefit from OMT. Treated today as below.

## 2015-09-28 NOTE — Progress Notes (Signed)
BP 105/67 mmHg  Pulse 77  Temp(Src) 97.8 F (36.6 C)  Ht 5' 5.25" (1.657 m)  Wt 201 lb (91.173 kg)  BMI 33.21 kg/m2  SpO2 96%  LMP  (LMP Unknown)   Subjective:    Patient ID: Tracie Sheppard, female    DOB: 18-Nov-1955, 60 y.o.   MRN: [id number]  HPI: Tracie Sheppard is a 60 y.o. female  Chief Complaint  Patient presents with  . OMM  . URI    x 10 days, no fever   UPPER RESPIRATORY TRACT INFECTION Duration: 10 days Worst symptom: cough and congestion Fever: no Cough: yes Shortness of breath: no Wheezing: no Chest pain: no Chest tightness: no Chest congestion: no Nasal congestion: yes Runny nose: yes Post nasal drip: yes Sneezing: yes Sore throat: yes Swollen glands: no Sinus pressure: no Headache: yes Face pain: no Toothache: yes Ear pain: no  Ear pressure: yes  Eyes red/itching:no Eye drainage/crusting: no  Vomiting: no Rash: no Fatigue: yes Sick contacts: yes Strep contacts: no  Context: better Recurrent sinusitis: no Relief with OTC cold/cough medications: no  Treatments attempted: flonase,  Nasal saline, tylenol, tessalon perles  She states that her back has been doing OK. Her R foot has been acting up a little and feeling tight and sore when she's walking on it. Better with rest and OMT. It radiates up her leg and into her hip and her R hip has also been feeling tight. She finds that OMT is very helpful and she would like to continue with it. She is otherwise feeling well with no other concerns or complaints at this time.   Relevant past medical, surgical, family and social history reviewed and updated as indicated. Interim medical history since our last visit reviewed. Allergies and medications reviewed and updated.  Review of Systems  Constitutional: Negative.   HENT: Positive for congestion, postnasal drip, rhinorrhea, sinus pressure, sneezing and sore throat. Negative for dental problem, drooling, ear discharge, ear pain, facial swelling, hearing  loss, mouth sores, nosebleeds, tinnitus, trouble swallowing and voice change.   Respiratory: Negative.   Cardiovascular: Negative.   Musculoskeletal: Positive for myalgias and arthralgias. Negative for back pain, joint swelling, gait problem, neck pain and neck stiffness.  Psychiatric/Behavioral: Negative.     Per HPI unless specifically indicated above     Objective:    BP 105/67 mmHg  Pulse 77  Temp(Src) 97.8 F (36.6 C)  Ht 5' 5.25" (1.657 m)  Wt 201 lb (91.173 kg)  BMI 33.21 kg/m2  SpO2 96%  LMP  (LMP Unknown)  Wt Readings from Last 3 Encounters:  09/28/15 201 lb (91.173 kg)  09/14/15 201 lb (91.173 kg)  08/24/15 204 lb 3.2 oz (92.625 kg)    Physical Exam  Constitutional: She is oriented to person, place, and time. She appears well-developed and well-nourished. No distress.  HENT:  Head: Normocephalic and atraumatic.  Right Ear: Hearing, tympanic membrane, external ear and ear canal normal.  Left Ear: Hearing, tympanic membrane, external ear and ear canal normal.  Nose: Mucosal edema and rhinorrhea present.  Mouth/Throat: Uvula is midline, oropharynx is clear and moist and mucous membranes are normal. No oropharyngeal exudate.  Eyes: Conjunctivae, EOM and lids are normal. Pupils are equal, round, and reactive to light. Right eye exhibits no discharge. Left eye exhibits no discharge. No scleral icterus.  Neck: Normal range of motion. Neck supple. No JVD present. No tracheal deviation present. No thyromegaly present.  Cardiovascular: Normal rate, regular rhythm, normal heart  sounds and intact distal pulses.  Exam reveals no gallop and no friction rub.   No murmur heard. Pulmonary/Chest: Effort normal and breath sounds normal. No stridor. No respiratory distress. She has no wheezes. She has no rales. She exhibits no tenderness.  Abdominal: Soft. She exhibits no distension and no mass. There is no tenderness. There is no rebound and no guarding.  Lymphadenopathy:    She has  cervical adenopathy.  Neurological: She is alert and oriented to person, place, and time.  Skin: Skin is warm, dry and intact. No rash noted. She is not diaphoretic. No erythema. No pallor.  Psychiatric: She has a normal mood and affect. Her speech is normal and behavior is normal. Judgment and thought content normal. Cognition and memory are normal.  Nursing note and vitals reviewed. Musculoskeletal:  Exam found Decreased ROM, Tissue texture changes, Tenderness to palpation and Asymmetry of patient's  ribs, lumbar, pelvis, sacrum, lower extremity and abdomen Osteopathic Structural Exam:   Ribs: Ribs 8-10 locked up on the L  Lumbar: psoas spasm on the R  Pelvis: Anterior R innominate  Sacrum: R on R torsion  Lower Extremity: 5th metatarsal restricted on the R with fascial strain through her foot, Posterior fibular head on the R  Abdomen: diaphragm spasm bilaterally R>L   Results for orders placed or performed in visit on 08/24/15  Microscopic Examination  Result Value Ref Range   WBC, UA 0-5 0 -  5 /hpf   RBC, UA 0-2 0 -  2 /hpf   Epithelial Cells (non renal) 0-10 0 - 10 /hpf   Mucus, UA Present Not Estab.   Bacteria, UA Few None seen/Few  TSH  Result Value Ref Range   TSH 0.676 0.450 - 4.500 uIU/mL  UA/M w/rflx Culture, Routine  Result Value Ref Range   Specific Gravity, UA 1.020 1.005 - 1.030   pH, UA 5.5 5.0 - 7.5   Color, UA Yellow Yellow   Appearance Ur Clear Clear   Leukocytes, UA Negative Negative   Protein, UA Negative Negative/Trace   Glucose, UA Negative Negative   Ketones, UA Negative Negative   RBC, UA Trace (A) Negative   Bilirubin, UA Negative Negative   Urobilinogen, Ur 0.2 0.2 - 1.0 mg/dL   Nitrite, UA Negative Negative   Microscopic Examination See below:   Comprehensive metabolic panel  Result Value Ref Range   Glucose 93 65 - 99 mg/dL   BUN 15 6 - 24 mg/dL   Creatinine, Ser 0.92 0.57 - 1.00 mg/dL   GFR calc non Af Amer 69 >59 mL/min/1.73   GFR calc Af  Amer 80 >59 mL/min/1.73   BUN/Creatinine Ratio 16 9 - 23   Sodium 142 134 - 144 mmol/L   Potassium 4.5 3.5 - 5.2 mmol/L   Chloride 105 96 - 106 mmol/L   CO2 21 18 - 29 mmol/L   Calcium 9.4 8.7 - 10.2 mg/dL   Total Protein 6.9 6.0 - 8.5 g/dL   Albumin 4.3 3.5 - 5.5 g/dL   Globulin, Total 2.6 1.5 - 4.5 g/dL   Albumin/Globulin Ratio 1.7 1.2 - 2.2   Bilirubin Total 0.9 0.0 - 1.2 mg/dL   Alkaline Phosphatase 66 39 - 117 IU/L   AST 38 0 - 40 IU/L   ALT 68 (H) 0 - 32 IU/L  CBC with Differential/Platelet  Result Value Ref Range   WBC 7.2 3.4 - 10.8 x10E3/uL   RBC 4.27 3.77 - 5.28 x10E6/uL   Hemoglobin 12.3 11.1 -  15.9 g/dL   Hematocrit 37.3 34.0 - 46.6 %   MCV 87 79 - 97 fL   MCH 28.8 26.6 - 33.0 pg   MCHC 33.0 31.5 - 35.7 g/dL   RDW 12.9 12.3 - 15.4 %   Platelets 252 150 - 379 x10E3/uL   Neutrophils 43 %   Lymphs 49 %   Monocytes 7 %   Eos 1 %   Basos 0 %   Neutrophils Absolute 3.1 1.4 - 7.0 x10E3/uL   Lymphocytes Absolute 3.5 (H) 0.7 - 3.1 x10E3/uL   Monocytes Absolute 0.5 0.1 - 0.9 x10E3/uL   EOS (ABSOLUTE) 0.1 0.0 - 0.4 x10E3/uL   Basophils Absolute 0.0 0.0 - 0.2 x10E3/uL   Immature Granulocytes 0 %   Immature Grans (Abs) 0.0 0.0 - 0.1 x10E3/uL  Hgb A1c w/o eAG  Result Value Ref Range   Hgb A1c MFr Bld 6.1 (H) 4.8 - 5.6 %  Lipid Panel w/o Chol/HDL Ratio  Result Value Ref Range   Cholesterol, Total 196 100 - 199 mg/dL   Triglycerides 60 0 - 149 mg/dL   HDL 53 >39 mg/dL   VLDL Cholesterol Cal 12 5 - 40 mg/dL   LDL Calculated 131 (H) 0 - 99 mg/dL      Assessment & Plan:   Problem List Items Addressed This Visit      Musculoskeletal and Integument   Peroneal tendonitis of right lower extremity    Acting up again because she's wearing flip flops. Encouraged proper shoe use. She does have some somatic dysfunction that seems to be contributing to her symptoms. I think she would benefit from OMT. Treated today as below.        Other Visit Diagnoses    Nasal congestion     -  Primary    Will treat with prednisone taper. Call with concerns or if not getting better. Continue to monitor     Somatic dysfunction of abdominal region        Lumbar region somatic dysfunction        Sacral region somatic dysfunction        Rib cage region somatic dysfunction        Lower limb region somatic dysfunction        Somatic dysfunction of pelvis region          After verbal consent was obtained, patient was treated today with osteopathic manipulative medicine to the regions of the ribs, lumbar, pelvis, sacrum, abdomen and lower extremity using the techniques of FPR, myofascial release, counterstrain, muscle energy, HVLA and soft tissue. Areas of compensation relating to her primary pain source also treated. Patient tolerated the procedure well with good objective and good subjective improvement in symptoms. She left the room in good condition. She was advised to stay well hydrated and that she may have some soreness following the procedure. If not improving or worsening, she will call and come in. She will return for reevaluation  In 3-4 weeks.   Follow up plan: Return 3-4 weeks.

## 2015-10-15 ENCOUNTER — Ambulatory Visit (INDEPENDENT_AMBULATORY_CARE_PROVIDER_SITE_OTHER): Payer: BC Managed Care – PPO | Admitting: Family Medicine

## 2015-10-15 ENCOUNTER — Encounter: Payer: Self-pay | Admitting: Family Medicine

## 2015-10-15 VITALS — BP 128/78 | HR 76 | Temp 98.2°F | Wt 201.0 lb

## 2015-10-15 DIAGNOSIS — M9903 Segmental and somatic dysfunction of lumbar region: Secondary | ICD-10-CM | POA: Diagnosis not present

## 2015-10-15 DIAGNOSIS — M9905 Segmental and somatic dysfunction of pelvic region: Secondary | ICD-10-CM | POA: Diagnosis not present

## 2015-10-15 DIAGNOSIS — M545 Low back pain, unspecified: Secondary | ICD-10-CM

## 2015-10-15 DIAGNOSIS — M9906 Segmental and somatic dysfunction of lower extremity: Secondary | ICD-10-CM | POA: Diagnosis not present

## 2015-10-15 DIAGNOSIS — M9908 Segmental and somatic dysfunction of rib cage: Secondary | ICD-10-CM

## 2015-10-15 DIAGNOSIS — M9902 Segmental and somatic dysfunction of thoracic region: Secondary | ICD-10-CM

## 2015-10-15 DIAGNOSIS — M9904 Segmental and somatic dysfunction of sacral region: Secondary | ICD-10-CM | POA: Diagnosis not present
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
## 2015-10-27 ENCOUNTER — Other Ambulatory Visit: Payer: Self-pay | Admitting: Family Medicine

## 2015-10-27 NOTE — Telephone Encounter (Signed)
Your patient.  Thanks 

## 2015-11-09 ENCOUNTER — Ambulatory Visit (INDEPENDENT_AMBULATORY_CARE_PROVIDER_SITE_OTHER): Payer: BC Managed Care – PPO | Admitting: Family Medicine

## 2015-11-09 ENCOUNTER — Encounter: Payer: Self-pay | Admitting: Family Medicine

## 2015-11-09 VITALS — BP 115/78 | HR 82 | Temp 98.1°F | Wt 203.0 lb

## 2015-11-09 DIAGNOSIS — M545 Low back pain, unspecified: Secondary | ICD-10-CM

## 2015-11-09 DIAGNOSIS — M9905 Segmental and somatic dysfunction of pelvic region: Secondary | ICD-10-CM | POA: Diagnosis not present

## 2015-11-09 DIAGNOSIS — M9900 Segmental and somatic dysfunction of head region: Secondary | ICD-10-CM

## 2015-11-09 DIAGNOSIS — M9903 Segmental and somatic dysfunction of lumbar region: Secondary | ICD-10-CM

## 2015-11-09 DIAGNOSIS — M9901 Segmental and somatic dysfunction of cervical region: Secondary | ICD-10-CM

## 2015-11-09 DIAGNOSIS — M9904 Segmental and somatic dysfunction of sacral region: Secondary | ICD-10-CM | POA: Diagnosis not present

## 2015-11-09 DIAGNOSIS — M9909 Segmental and somatic dysfunction of abdomen and other regions: Secondary | ICD-10-CM
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
## 2015-11-30 ENCOUNTER — Encounter: Payer: Self-pay | Admitting: Family Medicine

## 2015-11-30 ENCOUNTER — Ambulatory Visit (INDEPENDENT_AMBULATORY_CARE_PROVIDER_SITE_OTHER): Payer: BC Managed Care – PPO | Admitting: Family Medicine

## 2015-11-30 VITALS — BP 125/77 | HR 86 | Temp 98.1°F | Ht 65.25 in | Wt 204.0 lb

## 2015-11-30 DIAGNOSIS — M9909 Segmental and somatic dysfunction of abdomen and other regions: Secondary | ICD-10-CM | POA: Diagnosis not present

## 2015-11-30 DIAGNOSIS — M9903 Segmental and somatic dysfunction of lumbar region: Secondary | ICD-10-CM

## 2015-11-30 DIAGNOSIS — M9902 Segmental and somatic dysfunction of thoracic region: Secondary | ICD-10-CM

## 2015-11-30 DIAGNOSIS — M9901 Segmental and somatic dysfunction of cervical region: Secondary | ICD-10-CM

## 2015-11-30 DIAGNOSIS — M545 Low back pain, unspecified: Secondary | ICD-10-CM

## 2015-11-30 DIAGNOSIS — M9904 Segmental and somatic dysfunction of sacral region: Secondary | ICD-10-CM | POA: Diagnosis not present

## 2015-11-30 DIAGNOSIS — M9906 Segmental and somatic dysfunction of lower extremity: Secondary | ICD-10-CM | POA: Diagnosis not present

## 2015-11-30 DIAGNOSIS — M9900 Segmental and somatic dysfunction of head region: Secondary | ICD-10-CM | POA: Diagnosis not present

## 2015-11-30 DIAGNOSIS — M9908 Segmental and somatic dysfunction of rib cage: Secondary | ICD-10-CM

## 2015-11-30 DIAGNOSIS — M9905 Segmental and somatic dysfunction of pelvic region: Secondary | ICD-10-CM
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
## 2015-12-29 ENCOUNTER — Ambulatory Visit (INDEPENDENT_AMBULATORY_CARE_PROVIDER_SITE_OTHER): Payer: BC Managed Care – PPO | Admitting: Family Medicine

## 2015-12-29 ENCOUNTER — Encounter: Payer: Self-pay | Admitting: Family Medicine

## 2015-12-29 VITALS — BP 108/70 | HR 85 | Temp 98.3°F | Wt 201.0 lb

## 2015-12-29 DIAGNOSIS — Z23 Encounter for immunization: Secondary | ICD-10-CM

## 2015-12-29 DIAGNOSIS — M545 Low back pain, unspecified: Secondary | ICD-10-CM

## 2015-12-29 DIAGNOSIS — M9905 Segmental and somatic dysfunction of pelvic region: Secondary | ICD-10-CM

## 2015-12-29 DIAGNOSIS — M9904 Segmental and somatic dysfunction of sacral region: Secondary | ICD-10-CM | POA: Diagnosis not present

## 2015-12-29 DIAGNOSIS — M9902 Segmental and somatic dysfunction of thoracic region: Secondary | ICD-10-CM | POA: Diagnosis not present

## 2015-12-29 DIAGNOSIS — E079 Disorder of thyroid, unspecified: Secondary | ICD-10-CM | POA: Diagnosis not present

## 2015-12-29 DIAGNOSIS — M9900 Segmental and somatic dysfunction of head region: Secondary | ICD-10-CM

## 2015-12-29 DIAGNOSIS — M9908 Segmental and somatic dysfunction of rib cage: Secondary | ICD-10-CM

## 2015-12-29 DIAGNOSIS — M9903 Segmental and somatic dysfunction of lumbar region: Secondary | ICD-10-CM | POA: Diagnosis not present

## 2015-12-29 DIAGNOSIS — M9909 Segmental and somatic dysfunction of abdomen and other regions: Secondary | ICD-10-CM | POA: Diagnosis not present

## 2015-12-29 DIAGNOSIS — M9901 Segmental and somatic dysfunction of cervical region: Secondary | ICD-10-CM | POA: Diagnosis not present

## 2015-12-29 MED ORDER — THYROID 60 MG PO TABS: ORAL_TABLET | ORAL | 6 refills | Status: DC
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
## 2015-12-29 NOTE — Assessment & Plan Note (Signed)
Refill given today. Call with any concerns.

## 2015-12-29 NOTE — Patient Instructions (Signed)
Influenza (Flu) Vaccine (Inactivated or Recombinant):  1. Why get vaccinated? Influenza ("flu") is a contagious disease that spreads around the [place] every year, usually between October and May. Flu is caused by influenza viruses, and is spread mainly by coughing, sneezing, and close contact. Anyone can get flu. Flu strikes suddenly and can last several days. Symptoms vary by age, but can include:  fever/chills  sore throat  muscle aches  fatigue  cough  headache  runny or stuffy nose Flu can also lead to pneumonia and blood infections, and cause diarrhea and seizures in children. If you have a medical condition, such as heart or lung disease, flu can make it worse. Flu is more dangerous for some people. Infants and young children, people 65 years of age and older, pregnant women, and people with certain health conditions or a weakened immune system are at greatest risk. Each year thousands of people in the [place] die from flu, and many more are hospitalized. Flu vaccine can:  keep you from getting flu,  make flu less severe if you do get it, and  keep you from spreading flu to your family and other people. 2. Inactivated and recombinant flu vaccines A dose of flu vaccine is recommended every flu season. Children 6 months through 8 years of age may need two doses during the same flu season. Everyone else needs only one dose each flu season. Some inactivated flu vaccines contain a very small amount of a mercury-based preservative called thimerosal. Studies have not shown thimerosal in vaccines to be harmful, but flu vaccines that do not contain thimerosal are available. There is no live flu virus in flu shots. They cannot cause the flu. There are many flu viruses, and they are always changing. Each year a new flu vaccine is made to protect against three or four viruses that are likely to cause disease in the upcoming flu season. But even when the vaccine doesn't exactly  match these viruses, it may still provide some protection. Flu vaccine cannot prevent:  flu that is caused by a virus not covered by the vaccine, or  illnesses that look like flu but are not. It takes about 2 weeks for protection to develop after vaccination, and protection lasts through the flu season. 3. Some people should not get this vaccine Tell the person who is giving you the vaccine:  If you have any severe, life-threatening allergies. If you ever had a life-threatening allergic reaction after a dose of flu vaccine, or have a severe allergy to any part of this vaccine, you may be advised not to get vaccinated. Most, but not all, types of flu vaccine contain a small amount of egg protein.  If you ever had Guillain-Barre Syndrome (also called GBS). Some people with a history of GBS should not get this vaccine. This should be discussed with your doctor.  If you are not feeling well. It is usually okay to get flu vaccine when you have a mild illness, but you might be asked to come back when you feel better. 4. Risks of a vaccine reaction With any medicine, including vaccines, there is a chance of reactions. These are usually mild and go away on their own, but serious reactions are also possible. Most people who get a flu shot do not have any problems with it. Minor problems following a flu shot include:  soreness, redness, or swelling where the shot was given  hoarseness  sore, red or itchy eyes  cough    fever  aches  headache  itching  fatigue If these problems occur, they usually begin soon after the shot and last 1 or 2 days. More serious problems following a flu shot can include the following:  There may be a small increased risk of Guillain-Barre Syndrome (GBS) after inactivated flu vaccine. This risk has been estimated at 1 or 2 additional cases per million people vaccinated. This is much lower than the risk of severe complications from flu, which can be prevented by  flu vaccine.  Young children who get the flu shot along with pneumococcal vaccine (PCV13) and/or DTaP vaccine at the same time might be slightly more likely to have a seizure caused by fever. Ask your doctor for more information. Tell your doctor if a child who is getting flu vaccine has ever had a seizure. Problems that could happen after any injected vaccine:  People sometimes faint after a medical procedure, including vaccination. Sitting or lying down for about 15 minutes can help prevent fainting, and injuries caused by a fall. Tell your doctor if you feel dizzy, or have vision changes or ringing in the ears.  Some people get severe pain in the shoulder and have difficulty moving the arm where a shot was given. This happens very rarely.  Any medication can cause a severe allergic reaction. Such reactions from a vaccine are very rare, estimated at about 1 in a million doses, and would happen within a few minutes to a few hours after the vaccination. As with any medicine, there is a very remote chance of a vaccine causing a serious injury or death. The safety of vaccines is always being monitored. For more information, visit: www.cdc.gov/vaccinesafety/ 5. What if there is a serious reaction? What should I look for?  Look for anything that concerns you, such as signs of a severe allergic reaction, very high fever, or unusual behavior. Signs of a severe allergic reaction can include hives, swelling of the face and throat, difficulty breathing, a fast heartbeat, dizziness, and weakness. These would start a few minutes to a few hours after the vaccination. What should I do?  If you think it is a severe allergic reaction or other emergency that can't wait, call 9-1-1 and get the person to the nearest hospital. Otherwise, call your doctor.  Reactions should be reported to the Vaccine Adverse Event Reporting System (VAERS). Your doctor should file this report, or you can do it yourself through the  VAERS web site at www.vaers.hhs.gov, or by calling 1-800-822-7967. VAERS does not give medical advice. 6. The National Vaccine Injury Compensation Program The National Vaccine Injury Compensation Program (VICP) is a federal program that was created to compensate people who may have been injured by certain vaccines. Persons who believe they may have been injured by a vaccine can learn about the program and about filing a claim by calling 1-800-338-2382 or visiting the VICP website at www.hrsa.gov/vaccinecompensation. There is a time limit to file a claim for compensation. 7. How can I learn more?  Ask your healthcare provider. He or she can give you the vaccine package insert or suggest other sources of information.  Call your local or state health department.  Contact the Centers for Disease Control and Prevention (CDC):  Call 1-800-232-4636 (1-800-CDC-INFO) or  Visit CDC's website at www.cdc.gov/flu Vaccine Information Statement Inactivated Influenza Vaccine (11/08/2013)   This information is not intended to replace advice given to you by your health care provider. Make sure you discuss any questions you have with   your health care provider.   Document Released: 01/13/2006 Document Revised: 04/11/2014 Document Reviewed: 11/11/2013 Elsevier Interactive Patient Education 2016 Else[employer]. Influenza (Flu) Vaccine (Inactivated or Recombinant):  1. Why get vaccinated? Influenza ("flu") is a contagious disease that spreads around the [place] every year, usually between October and May. Flu is caused by influenza viruses, and is spread mainly by coughing, sneezing, and close contact. Anyone can get flu. Flu strikes suddenly and can last several days. Symptoms vary by age, but can include:  fever/chills  sore throat  muscle aches  fatigue  cough  headache  runny or stuffy nose Flu can also lead to pneumonia and blood infections, and cause diarrhea and seizures in children. If  you have a medical condition, such as heart or lung disease, flu can make it worse. Flu is more dangerous for some people. Infants and young children, people 10 years of age and older, pregnant women, and people with certain health conditions or a weakened immune system are at greatest risk. Each year thousands of people in the [place] die from flu, and many more are hospitalized. Flu vaccine can:  keep you from getting flu,  make flu less severe if you do get it, and  keep you from spreading flu to your family and other people. 2. Inactivated and recombinant flu vaccines A dose of flu vaccine is recommended every flu season. Children 6 months through 30 years of age may need two doses during the same flu season. Everyone else needs only one dose each flu season. Some inactivated flu vaccines contain a very small amount of a mercury-based preservative called thimerosal. Studies have not shown thimerosal in vaccines to be harmful, but flu vaccines that do not contain thimerosal are available. There is no live flu virus in flu shots. They cannot cause the flu. There are many flu viruses, and they are always changing. Each year a new flu vaccine is made to protect against three or four viruses that are likely to cause disease in the upcoming flu season. But even when the vaccine doesn't exactly match these viruses, it may still provide some protection. Flu vaccine cannot prevent:  flu that is caused by a virus not covered by the vaccine, or  illnesses that look like flu but are not. It takes about 2 weeks for protection to develop after vaccination, and protection lasts through the flu season. 3. Some people should not get this vaccine Tell the person who is giving you the vaccine:  If you have any severe, life-threatening allergies. If you ever had a life-threatening allergic reaction after a dose of flu vaccine, or have a severe allergy to any part of this vaccine, you may be advised not  to get vaccinated. Most, but not all, types of flu vaccine contain a small amount of egg protein.  If you ever had Guillain-Barre Syndrome (also called GBS). Some people with a history of GBS should not get this vaccine. This should be discussed with your doctor.  If you are not feeling well. It is usually okay to get flu vaccine when you have a mild illness, but you might be asked to come back when you feel better. 4. Risks of a vaccine reaction With any medicine, including vaccines, there is a chance of reactions. These are usually mild and go away on their own, but serious reactions are also possible. Most people who get a flu shot do not have any problems with it. Minor problems following a flu  shot include:  soreness, redness, or swelling where the shot was given  hoarseness  sore, red or itchy eyes  cough  fever  aches  headache  itching  fatigue If these problems occur, they usually begin soon after the shot and last 1 or 2 days. More serious problems following a flu shot can include the following:  There may be a small increased risk of Guillain-Barre Syndrome (GBS) after inactivated flu vaccine. This risk has been estimated at 1 or 2 additional cases per million people vaccinated. This is much lower than the risk of severe complications from flu, which can be prevented by flu vaccine.  Young children who get the flu shot along with pneumococcal vaccine (PCV13) and/or DTaP vaccine at the same time might be slightly more likely to have a seizure caused by fever. Ask your doctor for more information. Tell your doctor if a child who is getting flu vaccine has ever had a seizure. Problems that could happen after any injected vaccine:  People sometimes faint after a medical procedure, including vaccination. Sitting or lying down for about 15 minutes can help prevent fainting, and injuries caused by a fall. Tell your doctor if you feel dizzy, or have vision changes or ringing in  the ears.  Some people get severe pain in the shoulder and have difficulty moving the arm where a shot was given. This happens very rarely.  Any medication can cause a severe allergic reaction. Such reactions from a vaccine are very rare, estimated at about 1 in a million doses, and would happen within a few minutes to a few hours after the vaccination. As with any medicine, there is a very remote chance of a vaccine causing a serious injury or death. The safety of vaccines is always being monitored. For more information, visit: http://www.aguilar.org/ 5. What if there is a serious reaction? What should I look for?  Look for anything that concerns you, such as signs of a severe allergic reaction, very high fever, or unusual behavior. Signs of a severe allergic reaction can include hives, swelling of the face and throat, difficulty breathing, a fast heartbeat, dizziness, and weakness. These would start a few minutes to a few hours after the vaccination. What should I do?  If you think it is a severe allergic reaction or other emergency that can't wait, call 9-1-1 and get the person to the nearest hospital. Otherwise, call your doctor.  Reactions should be reported to the Vaccine Adverse Event Reporting System (VAERS). Your doctor should file this report, or you can do it yourself through the VAERS web site at www.vaers.SamedayNews.es, or by calling 814-166-8059. VAERS does not give medical advice. 6. The National Vaccine Injury Compensation Program The [employer] Vaccine Injury Compensation Program (VICP) is a federal program that was created to compensate people who may have been injured by certain vaccines. Persons who believe they may have been injured by a vaccine can learn about the program and about filing a claim by calling 5015831747 or visiting the [employer] website at GoldCloset.com.ee. There is a time limit to file a claim for compensation. 7. How can I learn more?  Ask  your healthcare provider. He or she can give you the vaccine package insert or suggest other sources of information.  Call your local or state health department.  Contact the Centers for Disease Control and Prevention (CDC):  Call (506)528-9628 (1-800-CDC-INFO) or  Visit CDC's website at https://gibson.com/ Vaccine Information Statement Inactivated Influenza Vaccine (11/08/2013)   This information  is not intended to replace advice given to you by your health care provider. Make sure you discuss any questions you have with your health care provider.   Document Released: 01/13/2006 Document Revised: 04/11/2014 Document Reviewed: 11/11/2013 Elsevier Interactive Patient Education [employer].
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
## 2016-01-14 ENCOUNTER — Encounter: Payer: Self-pay | Admitting: Family Medicine

## 2016-01-14 ENCOUNTER — Ambulatory Visit (INDEPENDENT_AMBULATORY_CARE_PROVIDER_SITE_OTHER): Payer: BC Managed Care – PPO | Admitting: Family Medicine

## 2016-01-14 VITALS — BP 149/77 | HR 94 | Temp 98.3°F | Wt 203.9 lb

## 2016-01-14 DIAGNOSIS — J40 Bronchitis, not specified as acute or chronic: Secondary | ICD-10-CM | POA: Diagnosis not present

## 2016-01-14 MED ORDER — AZITHROMYCIN 250 MG PO TABS
ORAL_TABLET | ORAL | 0 refills | Status: DC
Start: 2016-01-14 — End: 2016-01-14

## 2016-01-14 MED ORDER — PREDNISONE 10 MG PO TABS: ORAL_TABLET | ORAL | 0 refills | Status: DC

## 2016-01-14 MED ORDER — DOXYCYCLINE HYCLATE 100 MG PO TABS: 100.0000 mg | ORAL_TABLET | Freq: Two times a day (BID) | ORAL | 0 refills | Status: DC

## 2016-01-14 MED ORDER — HYDROCOD POLST-CPM POLST ER 10-8 MG/5ML PO SUER: 5.0000 mL | Freq: Every evening | ORAL | 0 refills | Status: DC | PRN

## 2016-01-14 NOTE — Addendum Note (Signed)
Addended by: Valerie Roys on: 01/14/2016 02:45 PM   Modules accepted: Orders
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
## 2016-01-26 ENCOUNTER — Ambulatory Visit: Payer: BC Managed Care – PPO | Admitting: Family Medicine

## 2016-01-28 ENCOUNTER — Ambulatory Visit (INDEPENDENT_AMBULATORY_CARE_PROVIDER_SITE_OTHER): Payer: BC Managed Care – PPO | Admitting: Family Medicine

## 2016-01-28 ENCOUNTER — Encounter: Payer: Self-pay | Admitting: Family Medicine

## 2016-01-28 VITALS — BP 121/78 | HR 92 | Temp 98.3°F | Wt 200.9 lb

## 2016-01-28 DIAGNOSIS — M9901 Segmental and somatic dysfunction of cervical region: Secondary | ICD-10-CM | POA: Diagnosis not present

## 2016-01-28 DIAGNOSIS — M9900 Segmental and somatic dysfunction of head region: Secondary | ICD-10-CM | POA: Diagnosis not present

## 2016-01-28 DIAGNOSIS — M9904 Segmental and somatic dysfunction of sacral region: Secondary | ICD-10-CM

## 2016-01-28 DIAGNOSIS — M9903 Segmental and somatic dysfunction of lumbar region: Secondary | ICD-10-CM

## 2016-01-28 DIAGNOSIS — M9905 Segmental and somatic dysfunction of pelvic region: Secondary | ICD-10-CM | POA: Diagnosis not present

## 2016-01-28 DIAGNOSIS — M9908 Segmental and somatic dysfunction of rib cage: Secondary | ICD-10-CM | POA: Diagnosis not present

## 2016-01-28 DIAGNOSIS — M25511 Pain in right shoulder: Secondary | ICD-10-CM

## 2016-01-28 DIAGNOSIS — M9907 Segmental and somatic dysfunction of upper extremity: Secondary | ICD-10-CM

## 2016-01-28 DIAGNOSIS — M545 Low back pain, unspecified: Secondary | ICD-10-CM

## 2016-01-28 DIAGNOSIS — M9909 Segmental and somatic dysfunction of abdomen and other regions: Secondary | ICD-10-CM

## 2016-01-28 DIAGNOSIS — G8929 Other chronic pain: Secondary | ICD-10-CM

## 2016-01-28 DIAGNOSIS — M9902 Segmental and somatic dysfunction of thoracic region: Secondary | ICD-10-CM
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
## 2016-01-28 NOTE — Patient Instructions (Addendum)
Generic Shoulder Exercises  EXERCISES   RANGE OF MOTION (ROM) AND STRETCHING EXERCISES  These exercises may help you when beginning to rehabilitate your injury. Your symptoms may resolve with or without further involvement from your physician, physical therapist or athletic trainer. While completing these exercises, remember:   · Restoring tissue flexibility helps normal motion to return to the joints. This allows healthier, less painful movement and activity.  · An effective stretch should be held for at least 30 seconds.  · A stretch should never be painful. You should only feel a gentle lengthening or release in the stretched tissue.  ROM - Pendulum  · Bend at the waist so that your right / left arm falls away from your body. Support yourself with your opposite hand on a solid surface, such as a table or a countertop.  · Your right / left arm should be perpendicular to the ground. If it is not perpendicular, you need to lean over farther. Relax the muscles in your right / left arm and shoulder as much as possible.  · Gently sway your hips and trunk so they move your right / left arm without any use of your right / left shoulder muscles.  · Progress your movements so that your right / left arm moves side to side, then forward and backward, and finally, both clockwise and counterclockwise.  · Complete __________ repetitions in each direction. Many people use this exercise to relieve discomfort in their shoulder as well as to gain range of motion.  Repeat __________ times. Complete this exercise __________ times per day.  STRETCH - Flexion, Standing  · Stand with good posture. With an underhand grip on your right / left hand and an overhand grip on the opposite hand, grasp a broomstick or cane so that your hands are a little more than shoulder-width apart.  · Keeping your right / left elbow straight and shoulder muscles relaxed, push the stick with your opposite hand to raise your right / left arm in front of your  body and then overhead. Raise your arm until you feel a stretch in your right / left shoulder, but before you have increased shoulder pain.  · Try to avoid shrugging your right / left shoulder as your arm rises by keeping your shoulder blade tucked down and toward your mid-back spine. Hold __________ seconds.  · Slowly return to the starting position.  Repeat __________ times. Complete this exercise __________ times per day.  STRETCH - Internal Rotation  · Place your right / left hand behind your back, palm-up.  · Throw a towel or belt over your opposite shoulder. Grasp the towel/belt with your right / left hand.  · While keeping an upright posture, gently pull up on the towel/belt until you feel a stretch in the front of your right / left shoulder.  · Avoid shrugging your right / left shoulder as your arm rises by keeping your shoulder blade tucked down and toward your mid-back spine.  · Hold __________. Release the stretch by lowering your opposite hand.  Repeat __________ times. Complete this exercise __________ times per day.  STRETCH - External Rotation and Abduction  · Stagger your stance through a doorframe. It does not matter which foot is forward.  · As instructed by your physician, physical therapist or athletic trainer, place your hands:    And forearms above your head and on the door frame.    And forearms at head-height and on the door frame.      At elbow-height and on the door frame.  · Keeping your head and chest upright and your stomach muscles tight to prevent over-extending your low-back, slowly shift your weight onto your front foot until you feel a stretch across your chest and/or in the front of your shoulders.  · Hold __________ seconds. Shift your weight to your back foot to release the stretch.  Repeat __________ times. Complete this stretch __________ times per day.   STRENGTHENING EXERCISES   These exercises may help you when beginning to rehabilitate your injury. They may resolve your  symptoms with or without further involvement from your physician, physical therapist or athletic trainer. While completing these exercises, remember:   · Muscles can gain both the endurance and the strength needed for everyday activities through controlled exercises.  · Complete these exercises as instructed by your physician, physical therapist or athletic trainer. Progress the resistance and repetitions only as guided.  · You may experience muscle soreness or fatigue, but the pain or discomfort you are trying to eliminate should never worsen during these exercises. If this pain does worsen, stop and make certain you are following the directions exactly. If the pain is still present after adjustments, discontinue the exercise until you can discuss the trouble with your clinician.  · If advised by your physician, during your recovery, avoid activity or exercises which involve actions that place your right / left hand or elbow above your head or behind your back or head. These positions stress the tissues which are trying to heal.  STRENGTH - Scapular Depression and Adduction  · With good posture, sit on a firm chair. Supported your arms in front of you with pillows, arm rests or a table top. Have your elbows in line with the sides of your body.  · Gently draw your shoulder blades down and toward your mid-back spine. Gradually increase the tension without tensing the muscles along the top of your shoulders and the back of your neck.  · Hold for __________ seconds. Slowly release the tension and relax your muscles completely before completing the next repetition.  · After you have practiced this exercise, remove the arm support and complete it in standing as well as sitting.  Repeat __________ times. Complete this exercise __________ times per day.   STRENGTH - External Rotators  · Secure a rubber exercise band/tubing to a fixed object so that it is at the same height as your right / left elbow when you are standing  or sitting on a firm surface.  · Stand or sit so that the secured exercise band/tubing is at your side that is not injured.  · Bend your elbow 90 degrees. Place a folded towel or small pillow under your right / left arm so that your elbow is a few inches away from your side.  · Keeping the tension on the exercise band/tubing, pull it away from your body, as if pivoting on your elbow. Be sure to keep your body steady so that the movement is only coming from your shoulder rotating.  · Hold __________ seconds. Release the tension in a controlled manner as you return to the starting position.  Repeat __________ times. Complete this exercise __________ times per day.   STRENGTH - Supraspinatus  · Stand or sit with good posture. Grasp a __________ weight or an exercise band/tubing so that your hand is "thumbs-up," like when you shake hands.  · Slowly lift your right / left hand from your thigh into the air,   traveling about 30 degrees from straight out at your side. Lift your hand to shoulder height or as far as you can without increasing any shoulder pain. Initially, many people do not lift their hands above shoulder height.  · Avoid shrugging your right / left shoulder as your arm rises by keeping your shoulder blade tucked down and toward your mid-back spine.  · Hold for __________ seconds. Control the descent of your hand as you slowly return to your starting position.  Repeat __________ times. Complete this exercise __________ times per day.   STRENGTH - Shoulder Extensors  · Secure a rubber exercise band/tubing so that it is at the height of your shoulders when you are either standing or sitting on a firm arm-less chair.  · With a thumbs-up grip, grasp an end of the band/tubing in each hand. Straighten your elbows and lift your hands straight in front of you at shoulder height. Step back away from the secured end of band/tubing until it becomes tense.  · Squeezing your shoulder blades together, pull your hands down  to the sides of your thighs. Do not allow your hands to go behind you.  · Hold for __________ seconds. Slowly ease the tension on the band/tubing as you reverse the directions and return to the starting position.  Repeat __________ times. Complete this exercise __________ times per day.   STRENGTH - Scapular Retractors  · Secure a rubber exercise band/tubing so that it is at the height of your shoulders when you are either standing or sitting on a firm arm-less chair.  · With a palm-down grip, grasp an end of the band/tubing in each hand. Straighten your elbows and lift your hands straight in front of you at shoulder height. Step back away from the secured end of band/tubing until it becomes tense.  · Squeezing your shoulder blades together, draw your elbows back as you bend them. Keep your upper arm lifted away from your body throughout the exercise.  · Hold __________ seconds. Slowly ease the tension on the band/tubing as you reverse the directions and return to the starting position.  Repeat __________ times. Complete this exercise __________ times per day.  STRENGTH - Scapular Depressors  · Find a sturdy chair without wheels, such as a from a dining room table.  · Keeping your feet on the floor, lift your bottom from the seat and lock your elbows.  · Keeping your elbows straight, allow gravity to pull your body weight down. Your shoulders will rise toward your ears.  · Raise your body against gravity by drawing your shoulder blades down your back, shortening the distance between your shoulders and ears. Although your feet should always maintain contact with the floor, your feet should progressively support less body weight as you get stronger.  · Hold __________ seconds. In a controlled and slow manner, lower your body weight to begin the next repetition.  Repeat __________ times. Complete this exercise __________ times per day.      This information is not intended to replace advice given to you by your health  care provider. Make sure you discuss any questions you have with your health care provider.     Document Released: 02/02/2005 Document Revised: 04/11/2014 Document Reviewed: 07/03/2008  [employer] Interactive Patient Education ©2016 [employer]
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
## 2016-02-23 ENCOUNTER — Ambulatory Visit (INDEPENDENT_AMBULATORY_CARE_PROVIDER_SITE_OTHER): Payer: BC Managed Care – PPO | Admitting: Family Medicine

## 2016-02-23 ENCOUNTER — Encounter: Payer: Self-pay | Admitting: Family Medicine

## 2016-02-23 VITALS — BP 133/79 | HR 101 | Temp 98.0°F | Wt 204.0 lb

## 2016-02-23 DIAGNOSIS — M9902 Segmental and somatic dysfunction of thoracic region: Secondary | ICD-10-CM

## 2016-02-23 DIAGNOSIS — M9906 Segmental and somatic dysfunction of lower extremity: Secondary | ICD-10-CM

## 2016-02-23 DIAGNOSIS — M9905 Segmental and somatic dysfunction of pelvic region: Secondary | ICD-10-CM | POA: Diagnosis not present

## 2016-02-23 DIAGNOSIS — M9909 Segmental and somatic dysfunction of abdomen and other regions: Secondary | ICD-10-CM | POA: Diagnosis not present

## 2016-02-23 DIAGNOSIS — M9901 Segmental and somatic dysfunction of cervical region: Secondary | ICD-10-CM | POA: Diagnosis not present

## 2016-02-23 DIAGNOSIS — G8929 Other chronic pain: Secondary | ICD-10-CM

## 2016-02-23 DIAGNOSIS — M9908 Segmental and somatic dysfunction of rib cage: Secondary | ICD-10-CM | POA: Diagnosis not present

## 2016-02-23 DIAGNOSIS — M9904 Segmental and somatic dysfunction of sacral region: Secondary | ICD-10-CM | POA: Diagnosis not present

## 2016-02-23 DIAGNOSIS — M9903 Segmental and somatic dysfunction of lumbar region: Secondary | ICD-10-CM

## 2016-02-23 DIAGNOSIS — M545 Low back pain: Secondary | ICD-10-CM | POA: Diagnosis not present

## 2016-02-23 DIAGNOSIS — M9900 Segmental and somatic dysfunction of head region: Secondary | ICD-10-CM

## 2016-02-23 DIAGNOSIS — M26609 Unspecified temporomandibular joint disorder, unspecified side: Secondary | ICD-10-CM | POA: Insufficient documentation
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
## 2016-02-23 NOTE — Assessment & Plan Note (Signed)
Discussed exercises and avoiding clenching. Would benefit from seeing counselor. Stress management. She does have some somatic dysfunction that I think is contributing to her symptoms. I think she would benefit from OMT. Treated today as below.
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
## 2016-03-15 ENCOUNTER — Encounter: Payer: Self-pay | Admitting: Family Medicine

## 2016-03-15 ENCOUNTER — Ambulatory Visit (INDEPENDENT_AMBULATORY_CARE_PROVIDER_SITE_OTHER): Payer: BC Managed Care – PPO | Admitting: Family Medicine

## 2016-03-15 VITALS — BP 121/70 | HR 100 | Temp 98.4°F | Wt 204.1 lb

## 2016-03-15 DIAGNOSIS — M9904 Segmental and somatic dysfunction of sacral region: Secondary | ICD-10-CM | POA: Diagnosis not present

## 2016-03-15 DIAGNOSIS — M545 Low back pain, unspecified: Secondary | ICD-10-CM

## 2016-03-15 DIAGNOSIS — G8929 Other chronic pain: Secondary | ICD-10-CM | POA: Diagnosis not present

## 2016-03-15 DIAGNOSIS — M9902 Segmental and somatic dysfunction of thoracic region: Secondary | ICD-10-CM | POA: Diagnosis not present

## 2016-03-15 DIAGNOSIS — M7631 Iliotibial band syndrome, right leg: Secondary | ICD-10-CM | POA: Diagnosis not present

## 2016-03-15 DIAGNOSIS — M9907 Segmental and somatic dysfunction of upper extremity: Secondary | ICD-10-CM

## 2016-03-15 DIAGNOSIS — M9903 Segmental and somatic dysfunction of lumbar region: Secondary | ICD-10-CM | POA: Diagnosis not present

## 2016-03-15 DIAGNOSIS — M9905 Segmental and somatic dysfunction of pelvic region: Secondary | ICD-10-CM | POA: Diagnosis not present

## 2016-03-15 DIAGNOSIS — M9900 Segmental and somatic dysfunction of head region: Secondary | ICD-10-CM

## 2016-03-15 DIAGNOSIS — M9906 Segmental and somatic dysfunction of lower extremity: Secondary | ICD-10-CM

## 2016-03-15 DIAGNOSIS — M9901 Segmental and somatic dysfunction of cervical region: Secondary | ICD-10-CM

## 2016-03-15 DIAGNOSIS — M9909 Segmental and somatic dysfunction of abdomen and other regions: Secondary | ICD-10-CM
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
## 2016-03-18 ENCOUNTER — Telehealth: Payer: Self-pay | Admitting: Family Medicine

## 2016-03-18 MED ORDER — SPIRONOLACTONE 100 MG PO TABS: 100.0000 mg | ORAL_TABLET | Freq: Two times a day (BID) | ORAL | 1 refills | Status: DC

## 2016-03-18 NOTE — Telephone Encounter (Signed)
Pt would like a refill for spironolactone (ALDACTONE) 100 MG tablet sent to [facility] [address]

## 2016-04-05 ENCOUNTER — Ambulatory Visit: Payer: BC Managed Care – PPO | Admitting: Family Medicine

## 2016-04-12 ENCOUNTER — Ambulatory Visit (INDEPENDENT_AMBULATORY_CARE_PROVIDER_SITE_OTHER): Payer: BC Managed Care – PPO | Admitting: Family Medicine

## 2016-04-12 ENCOUNTER — Encounter: Payer: Self-pay | Admitting: Family Medicine

## 2016-04-12 VITALS — BP 122/78 | HR 86 | Temp 98.4°F | Wt 202.2 lb

## 2016-04-12 DIAGNOSIS — J0101 Acute recurrent maxillary sinusitis: Secondary | ICD-10-CM | POA: Diagnosis not present

## 2016-04-12 MED ORDER — AMOXICILLIN-POT CLAVULANATE 875-125 MG PO TABS: 1.0000 | ORAL_TABLET | Freq: Two times a day (BID) | ORAL | 0 refills | Status: DC

## 2016-04-12 NOTE — Progress Notes (Signed)
BP 122/78 (BP Location: Left Arm, Patient Position: Sitting, Cuff Size: Large)   Pulse 86   Temp 98.4 F (36.9 C)   Wt 202 lb 3.2 oz (91.7 kg)   LMP  (LMP Unknown)   SpO2 98%   BMI 33.39 kg/m    Subjective:    Patient ID: Tracie Sheppard, female    DOB: [birth year], 61 y.o.   MRN: [id number]  HPI: Tracie Sheppard is a 61 y.o. female  Chief Complaint  Patient presents with  . Cough    X 1 week   UPPER RESPIRATORY TRACT INFECTION Duration: 2 weeks Worst symptom: cough Fever: no Cough: yes Shortness of breath: no Wheezing: no Chest pain: yes, with cough Chest tightness: yes Chest congestion: yes Nasal congestion: no Runny nose: yes Post nasal drip: yes Sneezing: no Sore throat: yes Swollen glands: no Sinus pressure: yes Headache: no Face pain: no Toothache: yes Ear pain: no  Ear pressure: yes bilateral Eyes red/itching:no Eye drainage/crusting: no  Vomiting: no Rash: no Fatigue: yes Sick contacts: yes Strep contacts: no  Context: worse Recurrent sinusitis: no Relief with OTC cold/cough medications: no  Treatments attempted: mucinex and anti-histamine, cough syrup   Relevant past medical, surgical, family and social history reviewed and updated as indicated. Interim medical history since our last visit reviewed. Allergies and medications reviewed and updated.  Review of Systems  Constitutional: Positive for fatigue. Negative for activity change, appetite change, chills, diaphoresis, fever and unexpected weight change.  HENT: Positive for congestion, postnasal drip, rhinorrhea, sinus pain, sinus pressure and sore throat. Negative for dental problem, drooling, ear discharge, ear pain, facial swelling, hearing loss, mouth sores, nosebleeds, sneezing, tinnitus, trouble swallowing and voice change.   Respiratory: Positive for cough. Negative for apnea, choking, chest tightness, shortness of breath, wheezing and stridor.   Cardiovascular: Negative.     Psychiatric/Behavioral: Negative.     Per HPI unless specifically indicated above     Objective:    BP 122/78 (BP Location: Left Arm, Patient Position: Sitting, Cuff Size: Large)   Pulse 86   Temp 98.4 F (36.9 C)   Wt 202 lb 3.2 oz (91.7 kg)   LMP  (LMP Unknown)   SpO2 98%   BMI 33.39 kg/m   Wt Readings from Last 3 Encounters:  04/12/16 202 lb 3.2 oz (91.7 kg)  03/15/16 204 lb 1.6 oz (92.6 kg)  02/23/16 204 lb (92.5 kg)    Physical Exam  Constitutional: She is oriented to person, place, and time. She appears well-developed and well-nourished. No distress.  HENT:  Head: Normocephalic and atraumatic.  Right Ear: Hearing, tympanic membrane, external ear and ear canal normal.  Left Ear: Hearing, tympanic membrane, external ear and ear canal normal.  Nose: Mucosal edema, rhinorrhea and sinus tenderness present. Right sinus exhibits no maxillary sinus tenderness and no frontal sinus tenderness. Left sinus exhibits maxillary sinus tenderness. Left sinus exhibits no frontal sinus tenderness.  Mouth/Throat: Uvula is midline, oropharynx is clear and moist and mucous membranes are normal. No oropharyngeal exudate.  Eyes: Conjunctivae, EOM and lids are normal. Pupils are equal, round, and reactive to light. Right eye exhibits no discharge. Left eye exhibits no discharge. No scleral icterus.  Neck: Normal range of motion. Neck supple. No JVD present. No tracheal deviation present. No thyromegaly present.  Cardiovascular: Normal rate, regular rhythm, normal heart sounds and intact distal pulses.  Exam reveals no gallop and no friction rub.   No murmur heard. Pulmonary/Chest: Effort normal and  breath sounds normal. No stridor. No respiratory distress. She has no wheezes. She has no rales. She exhibits no tenderness.  Musculoskeletal: Normal range of motion.  Lymphadenopathy:    She has cervical adenopathy.  Neurological: She is alert and oriented to person, place, and time.  Skin: Skin is  intact. No rash noted. She is not diaphoretic.  Psychiatric: She has a normal mood and affect. Her speech is normal and behavior is normal. Judgment and thought content normal. Cognition and memory are normal.  Nursing note and vitals reviewed.   Results for orders placed or performed in visit on 08/24/15  Microscopic Examination  Result Value Ref Range   WBC, UA 0-5 0 - 5 /hpf   RBC, UA 0-2 0 - 2 /hpf   Epithelial Cells (non renal) 0-10 0 - 10 /hpf   Mucus, UA Present Not Estab.   Bacteria, UA Few None seen/Few  TSH  Result Value Ref Range   TSH 0.676 0.450 - 4.500 uIU/mL  UA/M w/rflx Culture, Routine  Result Value Ref Range   Specific Gravity, UA 1.020 1.005 - 1.030   pH, UA 5.5 5.0 - 7.5   Color, UA Yellow Yellow   Appearance Ur Clear Clear   Leukocytes, UA Negative Negative   Protein, UA Negative Negative/Trace   Glucose, UA Negative Negative   Ketones, UA Negative Negative   RBC, UA Trace (A) Negative   Bilirubin, UA Negative Negative   Urobilinogen, Ur 0.2 0.2 - 1.0 mg/dL   Nitrite, UA Negative Negative   Microscopic Examination See below:   Comprehensive metabolic panel  Result Value Ref Range   Glucose 93 65 - 99 mg/dL   BUN 15 6 - 24 mg/dL   Creatinine, Ser 0.92 0.57 - 1.00 mg/dL   GFR calc non Af Amer 69 >59 mL/min/1.73   GFR calc Af Amer 80 >59 mL/min/1.73   BUN/Creatinine Ratio 16 9 - 23   Sodium 142 134 - 144 mmol/L   Potassium 4.5 3.5 - 5.2 mmol/L   Chloride 105 96 - 106 mmol/L   CO2 21 18 - 29 mmol/L   Calcium 9.4 8.7 - 10.2 mg/dL   Total Protein 6.9 6.0 - 8.5 g/dL   Albumin 4.3 3.5 - 5.5 g/dL   Globulin, Total 2.6 1.5 - 4.5 g/dL   Albumin/Globulin Ratio 1.7 1.2 - 2.2   Bilirubin Total 0.9 0.0 - 1.2 mg/dL   Alkaline Phosphatase 66 39 - 117 IU/L   AST 38 0 - 40 IU/L   ALT 68 (H) 0 - 32 IU/L  CBC with Differential/Platelet  Result Value Ref Range   WBC 7.2 3.4 - 10.8 x10E3/uL   RBC 4.27 3.77 - 5.28 x10E6/uL   Hemoglobin 12.3 11.1 - 15.9 g/dL    Hematocrit 37.3 34.0 - 46.6 %   MCV 87 79 - 97 fL   MCH 28.8 26.6 - 33.0 pg   MCHC 33.0 31.5 - 35.7 g/dL   RDW 12.9 12.3 - 15.4 %   Platelets 252 150 - 379 x10E3/uL   Neutrophils 43 %   Lymphs 49 %   Monocytes 7 %   Eos 1 %   Basos 0 %   Neutrophils Absolute 3.1 1.4 - 7.0 x10E3/uL   Lymphocytes Absolute 3.5 (H) 0.7 - 3.1 x10E3/uL   Monocytes Absolute 0.5 0.1 - 0.9 x10E3/uL   EOS (ABSOLUTE) 0.1 0.0 - 0.4 x10E3/uL   Basophils Absolute 0.0 0.0 - 0.2 x10E3/uL   Immature Granulocytes 0 %  Immature Grans (Abs) 0.0 0.0 - 0.1 x10E3/uL  Hgb A1c w/o eAG  Result Value Ref Range   Hgb A1c MFr Bld 6.1 (H) 4.8 - 5.6 %  Lipid Panel w/o Chol/HDL Ratio  Result Value Ref Range   Cholesterol, Total 196 100 - 199 mg/dL   Triglycerides 60 0 - 149 mg/dL   HDL 53 >39 mg/dL   VLDL Cholesterol Cal 12 5 - 40 mg/dL   LDL Calculated 131 (H) 0 - 99 mg/dL      Assessment & Plan:   Problem List Items Addressed This Visit    None    Visit Diagnoses    Acute recurrent maxillary sinusitis    -  Primary   Will treat with augmentin. Call with any concerns or if not getting better.       Follow up plan: Return ASAP , for OMM.

## 2016-04-12 NOTE — Patient Instructions (Signed)
Acidophilus Lactobacilus

## 2016-04-13 ENCOUNTER — Encounter: Payer: Self-pay | Admitting: Family Medicine

## 2016-04-13 ENCOUNTER — Ambulatory Visit (INDEPENDENT_AMBULATORY_CARE_PROVIDER_SITE_OTHER): Payer: BC Managed Care – PPO | Admitting: Family Medicine

## 2016-04-13 VITALS — BP 144/76 | HR 92 | Temp 97.9°F | Wt 202.6 lb

## 2016-04-13 DIAGNOSIS — G8929 Other chronic pain: Secondary | ICD-10-CM

## 2016-04-13 DIAGNOSIS — M9901 Segmental and somatic dysfunction of cervical region: Secondary | ICD-10-CM

## 2016-04-13 DIAGNOSIS — M9900 Segmental and somatic dysfunction of head region: Secondary | ICD-10-CM

## 2016-04-13 DIAGNOSIS — M9909 Segmental and somatic dysfunction of abdomen and other regions: Secondary | ICD-10-CM | POA: Diagnosis not present

## 2016-04-13 DIAGNOSIS — M545 Low back pain, unspecified: Secondary | ICD-10-CM

## 2016-04-13 DIAGNOSIS — M9904 Segmental and somatic dysfunction of sacral region: Secondary | ICD-10-CM

## 2016-04-13 DIAGNOSIS — M9905 Segmental and somatic dysfunction of pelvic region: Secondary | ICD-10-CM

## 2016-04-13 DIAGNOSIS — M9903 Segmental and somatic dysfunction of lumbar region: Secondary | ICD-10-CM | POA: Diagnosis not present

## 2016-04-13 DIAGNOSIS — M9908 Segmental and somatic dysfunction of rib cage: Secondary | ICD-10-CM

## 2016-04-13 DIAGNOSIS — M9902 Segmental and somatic dysfunction of thoracic region: Secondary | ICD-10-CM
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
## 2016-05-02 ENCOUNTER — Telehealth: Payer: Self-pay

## 2016-05-02 ENCOUNTER — Telehealth: Payer: Self-pay | Admitting: Family Medicine

## 2016-05-02 ENCOUNTER — Other Ambulatory Visit: Payer: BC Managed Care – PPO

## 2016-05-02 DIAGNOSIS — R300 Dysuria: Secondary | ICD-10-CM

## 2016-05-02 MED ORDER — CIPROFLOXACIN HCL 500 MG PO TABS: 500.0000 mg | ORAL_TABLET | Freq: Two times a day (BID) | ORAL | 0 refills | Status: DC

## 2016-05-02 NOTE — Telephone Encounter (Signed)
Please let Tracie Sheppard know that her urine looks like she has a UTI- I've sent an antibiotic to her pharmacy. Thanks!

## 2016-05-02 NOTE — Telephone Encounter (Signed)
Patient notified

## 2016-05-02 NOTE — Telephone Encounter (Signed)
LVM for patient to return my call 

## 2016-05-02 NOTE — Telephone Encounter (Signed)
OK to come in for urine- order in

## 2016-05-02 NOTE — Telephone Encounter (Signed)
Patient called and asked if she could come in a do a urine sample. She's having some blood in her urine and discomfort. I offered an appointment with Apolonio Schneiders and patient asked if she could just do labs.   She also said that you said you were going to order some labs on her, if she could get those done at the same time.

## 2016-05-03 ENCOUNTER — Other Ambulatory Visit: Payer: Self-pay | Admitting: Family Medicine

## 2016-05-05 ENCOUNTER — Encounter: Payer: Self-pay | Admitting: Family Medicine

## 2016-05-05 ENCOUNTER — Ambulatory Visit (INDEPENDENT_AMBULATORY_CARE_PROVIDER_SITE_OTHER): Payer: BC Managed Care – PPO | Admitting: Family Medicine

## 2016-05-05 VITALS — BP 112/72 | HR 101 | Temp 98.2°F | Ht 64.8 in | Wt 203.2 lb

## 2016-05-05 DIAGNOSIS — R7301 Impaired fasting glucose: Secondary | ICD-10-CM

## 2016-05-05 DIAGNOSIS — E079 Disorder of thyroid, unspecified: Secondary | ICD-10-CM

## 2016-05-05 DIAGNOSIS — I10 Essential (primary) hypertension: Secondary | ICD-10-CM | POA: Diagnosis not present

## 2016-05-05 DIAGNOSIS — R319 Hematuria, unspecified: Secondary | ICD-10-CM | POA: Diagnosis not present

## 2016-05-05 DIAGNOSIS — M791 Myalgia, unspecified site: Secondary | ICD-10-CM

## 2016-05-05 DIAGNOSIS — E782 Mixed hyperlipidemia: Secondary | ICD-10-CM

## 2016-05-05 LAB — BAYER DCA HB A1C WAIVED: HB A1C: 6.1 % (ref <7.0)

## 2016-05-05 LAB — LIPID PANEL PICCOLO, WAIVED
CHOL/HDL RATIO PICCOLO,WAIVE: 3.9 mg/dL
Cholesterol Piccolo, Waived: 227 mg/dL — ABNORMAL HIGH (ref <200)
HDL Chol Piccolo, Waived: 58 mg/dL (ref >59)
LDL CHOL CALC PICCOLO WAIVED: 158 mg/dL — AB (ref <100)
TRIGLYCERIDES PICCOLO,WAIVED: 58 mg/dL (ref <150)
VLDL CHOL CALC PICCOLO,WAIVE: 12 mg/dL (ref <30)

## 2016-05-05 LAB — MICROALBUMIN, URINE WAIVED
CREATININE, URINE WAIVED: 300 mg/dL (ref 10–300)
Microalb, Ur Waived: 80 mg/L — ABNORMAL HIGH (ref 0–19)

## 2016-05-05 LAB — UA/M W/RFLX CULTURE, ROUTINE
BILIRUBIN UA: NEGATIVE
GLUCOSE, UA: NEGATIVE
Ketones, UA: NEGATIVE
LEUKOCYTES UA: NEGATIVE
Nitrite, UA: NEGATIVE
PROTEIN UA: NEGATIVE
Specific Gravity, UA: 1.025 (ref 1.005–1.030)
UUROB: 0.2 mg/dL (ref 0.2–1.0)
pH, UA: 5.5 (ref 5.0–7.5)

## 2016-05-05 LAB — MICROSCOPIC EXAMINATION

## 2016-05-05 MED ORDER — METFORMIN HCL 500 MG PO TABS: 500.0000 mg | ORAL_TABLET | Freq: Two times a day (BID) | ORAL | 1 refills | Status: DC

## 2016-05-05 NOTE — Assessment & Plan Note (Signed)
Rechecking levels today, will adjust dosage as needed

## 2016-05-05 NOTE — Assessment & Plan Note (Signed)
Rechecking UA today. Await results. Continue cipro.

## 2016-05-05 NOTE — Assessment & Plan Note (Signed)
Under good control. Continue current regimen. Call with any concerns.  

## 2016-05-05 NOTE — Progress Notes (Signed)
BP 112/72   Pulse (!) 101   Temp 98.2 F (36.8 C)   Ht 5' 4.8" (1.646 m)   Wt 203 lb 3.2 oz (92.2 kg)   LMP  (LMP Unknown)   SpO2 95%   BMI 34.02 kg/m    Subjective:    Patient ID: Tracie Sheppard, female    DOB: 08-26-1955, 61 y.o.   MRN: [id number]  HPI: Tracie Sheppard is a 61 y.o. female  Chief Complaint  Patient presents with  . Diabetes  . Hyperlipidemia  . Hypothyroidism   Still recovering from her UTI.   Impaired Fasting Glucose- concerned about her sugars. She notes that her hands still tingle HbA1C:  Lab Results  Component Value Date   HGBA1C 6.1 (H) 08/24/2015   Duration of elevated blood sugar: chronic Polydipsia: no Polyuria: yes Weight change: no Visual disturbance: yes Glucose Monitoring: no Diabetic Education: Not Completed Family history of diabetes: yes  HYPOTHYROIDISM Thyroid control status:controlled Satisfied with current treatment? yes Medication side effects: no Medication compliance: excellent compliance Recent dose adjustment:no Fatigue: yes Cold intolerance: no Heat intolerance: yes Weight gain: no Weight loss: no Constipation: no Diarrhea/loose stools: yes Palpitations: yes Lower extremity edema: no Anxiety/depressed mood: yes  HYPERTENSION / HYPERLIPIDEMIA Satisfied with current treatment? yes Duration of hypertension: chronic BP monitoring frequency: not checking BP range:  BP medication side effects: no Past BP meds: spironalactone Duration of hyperlipidemia: chronic Cholesterol medication side effects: no Cholesterol supplements: red yeast rice Past cholesterol medications: none Medication compliance: excellent compliance Aspirin: no Recent stressors: yes Recurrent headaches: no Visual changes: yes Palpitations: yes Dyspnea: no Chest pain: no Lower extremity edema: no Dizzy/lightheaded: no  Relevant past medical, surgical, family and social history reviewed and updated as indicated. Interim medical history  since our last visit reviewed. Allergies and medications reviewed and updated.  Review of Systems  Constitutional: Negative.   Respiratory: Negative.   Cardiovascular: Negative.   Gastrointestinal: Negative.   Genitourinary: Positive for dysuria and urgency. Negative for decreased urine volume, difficulty urinating, dyspareunia, enuresis, flank pain, frequency, genital sores, hematuria, menstrual problem, pelvic pain, vaginal bleeding, vaginal discharge and vaginal pain.  Musculoskeletal: Negative.   Psychiatric/Behavioral: Negative.     Per HPI unless specifically indicated above     Objective:    BP 112/72   Pulse (!) 101   Temp 98.2 F (36.8 C)   Ht 5' 4.8" (1.646 m)   Wt 203 lb 3.2 oz (92.2 kg)   LMP  (LMP Unknown)   SpO2 95%   BMI 34.02 kg/m   Wt Readings from Last 3 Encounters:  05/05/16 203 lb 3.2 oz (92.2 kg)  04/13/16 202 lb 9.6 oz (91.9 kg)  04/12/16 202 lb 3.2 oz (91.7 kg)    Physical Exam  Constitutional: She is oriented to person, place, and time. She appears well-developed and well-nourished. No distress.  HENT:  Head: Normocephalic and atraumatic.  Right Ear: Hearing normal.  Left Ear: Hearing normal.  Nose: Nose normal.  Eyes: Conjunctivae and lids are normal. Right eye exhibits no discharge. Left eye exhibits no discharge. No scleral icterus.  Cardiovascular: Normal rate, regular rhythm, normal heart sounds and intact distal pulses.  Exam reveals no gallop and no friction rub.   No murmur heard. Pulmonary/Chest: Effort normal and breath sounds normal. No respiratory distress. She has no wheezes. She has no rales.  Musculoskeletal: Normal range of motion.  Neurological: She is alert and oriented to person, place, and time.  Skin:  Skin is warm, dry and intact. No rash noted. She is not diaphoretic. No erythema. No pallor.  Psychiatric: She has a normal mood and affect. Her speech is normal and behavior is normal. Judgment and thought content normal.  Cognition and memory are normal.  Nursing note and vitals reviewed.   Results for orders placed or performed in visit on 05/02/16  Microscopic Examination  Result Value Ref Range   WBC, UA >30 (H) 0 - 5 /hpf   RBC, UA 3-10 (A) 0 - 2 /hpf   Epithelial Cells (non renal) 0-10 0 - 10 /hpf   Mucus, UA Present (A) Not Estab.   Bacteria, UA Many (A) None seen/Few   Yeast, UA Present (A) None seen  UA/M w/rflx Culture, Routine  Result Value Ref Range   Specific Gravity, UA 1.020 1.005 - 1.030   pH, UA 5.5 5.0 - 7.5   Color, UA Yellow Yellow   Appearance Ur Cloudy (A) Clear   Leukocytes, UA 2+ (A) Negative   Protein, UA 2+ (A) Negative/Trace   Glucose, UA Negative Negative   Ketones, UA Negative Negative   RBC, UA 3+ (A) Negative   Bilirubin, UA Negative Negative   Urobilinogen, Ur 0.2 0.2 - 1.0 mg/dL   Nitrite, UA Positive (A) Negative   Microscopic Examination See below:    Urinalysis Reflex Comment   Urine Culture, Routine  Result Value Ref Range   Urine Culture, Routine Preliminary report (A)    Urine Culture result 1 Gram negative rods (A)       Assessment & Plan:   Problem List Items Addressed This Visit      Cardiovascular and Mediastinum   HTN (hypertension)    Under good control on recheck. Continue current regimen. Continue to monitor. Call with any concerns.       Relevant Orders   CBC with Differential/Platelet   Microalbumin, Urine Waived   Uric acid     Endocrine   Thyroid disease - Primary    Rechecking levels today, will adjust dosage as needed      Relevant Orders   CBC with Differential/Platelet   Comprehensive metabolic panel   TSH   IFG (impaired fasting glucose)    Under good control. Continue current regimen. Call with any concerns.       Relevant Orders   CBC with Differential/Platelet   Bayer DCA Hb A1c Waived   Comprehensive metabolic panel     Other   Hematuria    Rechecking UA today. Await results. Continue cipro.        Relevant Orders   CBC with Differential/Platelet   Comprehensive metabolic panel   UA/M w/rflx Culture, Routine   Hyperlipidemia    On Red yeast rice and fish oil, not interested in Statins. Await results.       Relevant Orders   CBC with Differential/Platelet   Comprehensive metabolic panel   Lipid Panel [place], [place]    Other Visit Diagnoses    Myalgia       Checking labs today. Call with any concerns.    Relevant Orders   VITAMIN D 25 Hydroxy (Vit-D Deficiency, Fractures)       Follow up plan: Return in about 6 months (around 11/02/2016) for Physical.

## 2016-05-05 NOTE — Assessment & Plan Note (Signed)
On Red yeast rice and fish oil, not interested in Statins. Await results.

## 2016-05-05 NOTE — Assessment & Plan Note (Signed)
Under good control on recheck. Continue current regimen. Continue to monitor. Call with any concerns.  

## 2016-05-06 ENCOUNTER — Other Ambulatory Visit: Payer: Self-pay | Admitting: Family Medicine

## 2016-05-06 LAB — UA/M W/RFLX CULTURE, ROUTINE
Bilirubin, UA: NEGATIVE
GLUCOSE, UA: NEGATIVE
Ketones, UA: NEGATIVE
NITRITE UA: POSITIVE — AB
SPEC GRAV UA: 1.02 (ref 1.005–1.030)
UUROB: 0.2 mg/dL (ref 0.2–1.0)
pH, UA: 5.5 (ref 5.0–7.5)

## 2016-05-06 LAB — CBC WITH DIFFERENTIAL/PLATELET
BASOS ABS: 0 10*3/uL (ref 0.0–0.2)
Basos: 0 %
EOS (ABSOLUTE): 0.1 10*3/uL (ref 0.0–0.4)
Eos: 1 %
HEMOGLOBIN: 12.8 g/dL (ref 11.1–15.9)
Hematocrit: 37.8 % (ref 34.0–46.6)
IMMATURE GRANS (ABS): 0 10*3/uL (ref 0.0–0.1)
IMMATURE GRANULOCYTES: 0 %
LYMPHS: 52 %
Lymphocytes Absolute: 3.4 10*3/uL — ABNORMAL HIGH (ref 0.7–3.1)
MCH: 29.4 pg (ref 26.6–33.0)
MCHC: 33.9 g/dL (ref 31.5–35.7)
MCV: 87 fL (ref 79–97)
MONOCYTES: 6 %
Monocytes Absolute: 0.4 10*3/uL (ref 0.1–0.9)
NEUTROS PCT: 41 %
Neutrophils Absolute: 2.7 10*3/uL (ref 1.4–7.0)
PLATELETS: 266 10*3/uL (ref 150–379)
RBC: 4.35 x10E6/uL (ref 3.77–5.28)
RDW: 13 % (ref 12.3–15.4)
WBC: 6.7 10*3/uL (ref 3.4–10.8)

## 2016-05-06 LAB — VITAMIN D 25 HYDROXY (VIT D DEFICIENCY, FRACTURES): VIT D 25 HYDROXY: 41.9 ng/mL (ref 30.0–100.0)

## 2016-05-06 LAB — MICROSCOPIC EXAMINATION

## 2016-05-06 LAB — COMPREHENSIVE METABOLIC PANEL
ALBUMIN: 4.4 g/dL (ref 3.5–5.5)
ALT: 42 IU/L — AB (ref 0–32)
AST: 34 IU/L (ref 0–40)
Albumin/Globulin Ratio: 1.5 (ref 1.2–2.2)
Alkaline Phosphatase: 78 IU/L (ref 39–117)
BILIRUBIN TOTAL: 1.2 mg/dL (ref 0.0–1.2)
BUN / CREAT RATIO: 19 (ref 9–23)
BUN: 18 mg/dL (ref 6–24)
CHLORIDE: 99 mmol/L (ref 96–106)
CO2: 22 mmol/L (ref 18–29)
CREATININE: 0.93 mg/dL (ref 0.57–1.00)
Calcium: 9.5 mg/dL (ref 8.7–10.2)
GFR calc non Af Amer: 68 mL/min/{1.73_m2} (ref >59)
GFR, EST AFRICAN AMERICAN: 78 mL/min/{1.73_m2} (ref >59)
GLUCOSE: 93 mg/dL (ref 65–99)
Globulin, Total: 2.9 g/dL (ref 1.5–4.5)
Potassium: 4.2 mmol/L (ref 3.5–5.2)
Sodium: 137 mmol/L (ref 134–144)
TOTAL PROTEIN: 7.3 g/dL (ref 6.0–8.5)

## 2016-05-06 LAB — URINE CULTURE, REFLEX

## 2016-05-06 LAB — TSH: TSH: 0.961 u[IU]/mL (ref 0.450–4.500)

## 2016-05-06 LAB — URIC ACID: Uric Acid: 5.6 mg/dL (ref 2.5–7.1)

## 2016-05-06 MED ORDER — THYROID 60 MG PO TABS: ORAL_TABLET | ORAL | 6 refills | Status: DC

## 2016-05-11 LAB — HM DIABETES EYE EXAM

## 2016-05-16 ENCOUNTER — Ambulatory Visit: Payer: BC Managed Care – PPO | Admitting: Family Medicine

## 2016-07-11 LAB — HM MAMMOGRAPHY

## 2016-07-19 ENCOUNTER — Encounter: Payer: Self-pay | Admitting: Family Medicine

## 2016-07-19 ENCOUNTER — Ambulatory Visit (INDEPENDENT_AMBULATORY_CARE_PROVIDER_SITE_OTHER): Payer: BC Managed Care – PPO | Admitting: Family Medicine

## 2016-07-19 VITALS — BP 109/76 | HR 83 | Temp 97.9°F | Wt 203.3 lb

## 2016-07-19 DIAGNOSIS — M545 Low back pain: Secondary | ICD-10-CM | POA: Diagnosis not present

## 2016-07-19 DIAGNOSIS — G8929 Other chronic pain: Secondary | ICD-10-CM | POA: Diagnosis not present

## 2016-07-19 DIAGNOSIS — M9909 Segmental and somatic dysfunction of abdomen and other regions: Secondary | ICD-10-CM

## 2016-07-19 DIAGNOSIS — M9904 Segmental and somatic dysfunction of sacral region: Secondary | ICD-10-CM

## 2016-07-19 DIAGNOSIS — M9901 Segmental and somatic dysfunction of cervical region: Secondary | ICD-10-CM

## 2016-07-19 DIAGNOSIS — M9902 Segmental and somatic dysfunction of thoracic region: Secondary | ICD-10-CM | POA: Diagnosis not present

## 2016-07-19 DIAGNOSIS — M9905 Segmental and somatic dysfunction of pelvic region: Secondary | ICD-10-CM | POA: Diagnosis not present

## 2016-07-19 DIAGNOSIS — M9900 Segmental and somatic dysfunction of head region: Secondary | ICD-10-CM

## 2016-07-19 DIAGNOSIS — M9903 Segmental and somatic dysfunction of lumbar region: Secondary | ICD-10-CM | POA: Diagnosis not present

## 2016-07-19 NOTE — Progress Notes (Signed)
BP 109/76 (BP Location: Left Arm, Patient Position: Sitting, Cuff Size: Large)   Pulse 83   Temp 97.9 F (36.6 C)   Wt 203 lb 4.8 oz (92.2 kg)   LMP  (LMP Unknown)   SpO2 98%   BMI 34.04 kg/m    Subjective:    Patient ID: Tracie Sheppard, female    DOB: 1955-06-23, 61 y.o.   MRN: [id number]  HPI: Tracie Sheppard is a 61 y.o. female  Chief Complaint  Patient presents with  . OMM   Has been having pain in her R hip. She notes that it has been hurting for a couple of weeks, more jabbing in nature. Going into her R leg and into her low back. Feet are doing well. Has been having some pain in her R middle finger. She did well following her last appointment and felt good until about 2 weeks ago. She is better with OMT and meds and worse with stress and certain movements. She is otherwise feeling well with no other concerns or complaints at this time.   Relevant past medical, surgical, family and social history reviewed and updated as indicated. Interim medical history since our last visit reviewed. Allergies and medications reviewed and updated.  Review of Systems  Constitutional: Negative.   Respiratory: Negative.   Cardiovascular: Negative.   Musculoskeletal: Positive for arthralgias and myalgias. Negative for back pain, gait problem, joint swelling, neck pain and neck stiffness.  Neurological: Negative.   Psychiatric/Behavioral: Negative.     Per HPI unless specifically indicated above     Objective:    BP 109/76 (BP Location: Left Arm, Patient Position: Sitting, Cuff Size: Large)   Pulse 83   Temp 97.9 F (36.6 C)   Wt 203 lb 4.8 oz (92.2 kg)   LMP  (LMP Unknown)   SpO2 98%   BMI 34.04 kg/m   Wt Readings from Last 3 Encounters:  07/19/16 203 lb 4.8 oz (92.2 kg)  05/05/16 203 lb 3.2 oz (92.2 kg)  04/13/16 202 lb 9.6 oz (91.9 kg)    Physical Exam  Constitutional: She is oriented to person, place, and time. She appears well-developed and well-nourished. No distress.    HENT:  Head: Normocephalic and atraumatic.  Right Ear: Hearing normal.  Left Ear: Hearing normal.  Nose: Nose normal.  Eyes: Conjunctivae and lids are normal. Right eye exhibits no discharge. Left eye exhibits no discharge. No scleral icterus.  Pulmonary/Chest: Effort normal. No respiratory distress.  Abdominal: Soft. She exhibits no distension and no mass. There is no tenderness. There is no rebound and no guarding.  Musculoskeletal: She exhibits tenderness. She exhibits no edema or deformity.  Neurological: She is alert and oriented to person, place, and time.  Skin: Skin is warm, dry and intact. No rash noted. No erythema. No pallor.  Psychiatric: She has a normal mood and affect. Her speech is normal and behavior is normal. Judgment and thought content normal. Cognition and memory are normal.  Nursing note and vitals reviewed. Musculoskeletal:  Exam found Decreased ROM, Tissue texture changes, Tenderness to palpation and Asymmetry of patient's  head, neck, thorax, ribs, lumbar, pelvis and sacrum Osteopathic Structural Exam:   Head: Hypertonic suboccipital muscles  Neck: SCM hypertonic on the L, C4ESRR  Thorax: Ribs 3-6 locked up on the R  Ribs: Ribs 5-7 locked up on the R  Lumbar: QL hypertonic on the L, L3-4SRRL  Pelvis: Posterior R innominate, SI joint restricted on the R  Sacrum: R  on R sacral torsion  Results for orders placed or performed in visit on 05/17/16  HM DIABETES EYE EXAM  Result Value Ref Range   HM Diabetic Eye Exam No Retinopathy No Retinopathy      Assessment & Plan:   Problem List Items Addressed This Visit      Other   Low back pain - Primary    In slight exacrbation, likely myofascial. I think she would benefit from OMT. Treated today as below. Call with any concerns.       Other Visit Diagnoses    Lumbar region somatic dysfunction       Sacral region somatic dysfunction       Somatic dysfunction of pelvis region       Somatic dysfunction of  abdominal region       Cervical somatic dysfunction       Head region somatic dysfunction       Thoracic segment dysfunction         After verbal consent was obtained, patient was treated today with osteopathic manipulative medicine to the regions of the head, neck, thorax, ribs, lumbar, pelvis and sacrum using the techniques of myofascial release, counterstrain, muscle energy, HVLA and soft tissue. Areas of compensation relating to her primary pain source also treated. Patient tolerated the procedure well with good objective and good subjective improvement in symptoms. She left the room in good condition. She was advised to stay well hydrated and that she may have some soreness following the procedure. If not improving or worsening, she will call and come in. She will return for reevaluation  In 3-4 weeks.   Follow up plan: Return in about 4 weeks (around 08/16/2016).

## 2016-07-19 NOTE — Assessment & Plan Note (Signed)
In slight exacrbation, likely myofascial. I think she would benefit from OMT. Treated today as below. Call with any concerns.

## 2016-08-02 ENCOUNTER — Other Ambulatory Visit: Payer: Self-pay | Admitting: Family Medicine

## 2016-08-02 NOTE — Telephone Encounter (Signed)
OK to call in her Klonopin 

## 2016-08-02 NOTE — Telephone Encounter (Signed)
Called in and left on prescriber voicemail.  

## 2016-08-10 ENCOUNTER — Ambulatory Visit: Payer: BC Managed Care – PPO | Admitting: Family Medicine

## 2016-08-16 ENCOUNTER — Encounter: Payer: Self-pay | Admitting: Family Medicine

## 2016-08-16 ENCOUNTER — Ambulatory Visit (INDEPENDENT_AMBULATORY_CARE_PROVIDER_SITE_OTHER): Payer: BC Managed Care – PPO | Admitting: Family Medicine

## 2016-08-16 VITALS — BP 102/69 | HR 82 | Temp 98.0°F | Ht 64.0 in | Wt 203.4 lb

## 2016-08-16 DIAGNOSIS — N390 Urinary tract infection, site not specified: Secondary | ICD-10-CM | POA: Diagnosis not present

## 2016-08-16 MED ORDER — SULFAMETHOXAZOLE-TRIMETHOPRIM 800-160 MG PO TABS: 1.0000 | ORAL_TABLET | Freq: Two times a day (BID) | ORAL | 0 refills | Status: DC

## 2016-08-16 MED ORDER — PHENAZOPYRIDINE HCL 200 MG PO TABS: 200.0000 mg | ORAL_TABLET | Freq: Three times a day (TID) | ORAL | 0 refills | Status: DC | PRN

## 2016-08-16 NOTE — Patient Instructions (Signed)
Follow up if no improvement 

## 2016-08-16 NOTE — Progress Notes (Signed)
BP 102/69 (BP Location: Right Arm, Patient Position: Sitting, Cuff Size: Large)   Pulse 82   Temp 98 F (36.7 C)   Ht 5\' 4"  (1.626 m)   Wt 203 lb 6.4 oz (92.3 kg)   LMP  (LMP Unknown)   SpO2 98%   BMI 34.91 kg/m    Subjective:    Patient ID: Tracie Sheppard, female    DOB: 1956/02/23, 61 y.o.   MRN: [id number]  HPI: Tracie Sheppard is a 61 y.o. female  Chief Complaint  Patient presents with  . Cloudy Urine    x's few days  . Hematuria    Started this morning.   Patient presents with several days of cloudy urine, urgency, frequency, and only being able to get a tiny bit of urine out at a time. Started having aching, cramping suprapubic discomfort and hematuria yesterday. Hx of hematuria with all previous UTIs. Hx of kidney stones, but states not having any back or abdominal pain today. Denies fever, chills, back pain, N/V. Not currently taking anything OTC.   Relevant past medical, surgical, family and social history reviewed and updated as indicated. Interim medical history since our last visit reviewed. Allergies and medications reviewed and updated.  Review of Systems  Constitutional: Negative.   HENT: Negative.   Eyes: Negative.   Respiratory: Negative.   Cardiovascular: Negative.   Gastrointestinal: Negative.   Genitourinary: Positive for decreased urine volume, dysuria, flank pain, frequency, hematuria and urgency.  Neurological: Negative.   Psychiatric/Behavioral: Negative.    Per HPI unless specifically indicated above     Objective:    BP 102/69 (BP Location: Right Arm, Patient Position: Sitting, Cuff Size: Large)   Pulse 82   Temp 98 F (36.7 C)   Ht 5\' 4"  (1.626 m)   Wt 203 lb 6.4 oz (92.3 kg)   LMP  (LMP Unknown)   SpO2 98%   BMI 34.91 kg/m   Wt Readings from Last 3 Encounters:  08/16/16 203 lb 6.4 oz (92.3 kg)  07/19/16 203 lb 4.8 oz (92.2 kg)  05/05/16 203 lb 3.2 oz (92.2 kg)    Physical Exam  Constitutional: She is oriented to person, place,  and time. She appears well-developed and well-nourished. No distress.  HENT:  Head: Atraumatic.  Eyes: Conjunctivae are normal. Pupils are equal, round, and reactive to light.  Neck: Normal range of motion. Neck supple.  Cardiovascular: Normal rate and normal heart sounds.   Pulmonary/Chest: Effort normal and breath sounds normal. No respiratory distress.  Abdominal: Soft. Bowel sounds are normal. She exhibits no distension. There is no tenderness.  Musculoskeletal: Normal range of motion.  No CVA tenderness b/l  Neurological: She is alert and oriented to person, place, and time. No cranial nerve deficit.  Skin: Skin is warm and dry.  Psychiatric: She has a normal mood and affect. Her behavior is normal.  Nursing note and vitals reviewed.     Assessment & Plan:   Problem List Items Addressed This Visit    None    Visit Diagnoses    Acute lower UTI    -  Primary   U/A +, will treat with bactrim and pyridium prn. Await cx results. Discussed probiotic, cranberry, push fluids, f/u if worsening or no improvement   Relevant Medications   sulfamethoxazole-trimethoprim (BACTRIM DS,SEPTRA DS) 800-160 MG tablet   phenazopyridine (PYRIDIUM) 200 MG tablet   Other Relevant Orders   UA/M w/rflx Culture, Routine       Follow  up plan: Return if symptoms worsen or fail to improve.

## 2016-08-19 LAB — UA/M W/RFLX CULTURE, ROUTINE
BILIRUBIN UA: NEGATIVE
GLUCOSE, UA: NEGATIVE
Nitrite, UA: NEGATIVE
SPEC GRAV UA: 1.025 (ref 1.005–1.030)
Urobilinogen, Ur: 0.2 mg/dL (ref 0.2–1.0)
pH, UA: 5.5 (ref 5.0–7.5)

## 2016-08-19 LAB — URINE CULTURE, REFLEX

## 2016-08-19 LAB — MICROSCOPIC EXAMINATION: BACTERIA UA: NONE SEEN

## 2016-08-24 ENCOUNTER — Encounter: Payer: Self-pay | Admitting: Family Medicine

## 2016-08-24 ENCOUNTER — Ambulatory Visit (INDEPENDENT_AMBULATORY_CARE_PROVIDER_SITE_OTHER): Payer: BC Managed Care – PPO | Admitting: Family Medicine

## 2016-08-24 VITALS — BP 128/80 | HR 96 | Temp 98.0°F | Wt 205.1 lb

## 2016-08-24 DIAGNOSIS — M545 Low back pain: Secondary | ICD-10-CM | POA: Diagnosis not present

## 2016-08-24 DIAGNOSIS — M9904 Segmental and somatic dysfunction of sacral region: Secondary | ICD-10-CM | POA: Diagnosis not present

## 2016-08-24 DIAGNOSIS — M9906 Segmental and somatic dysfunction of lower extremity: Secondary | ICD-10-CM

## 2016-08-24 DIAGNOSIS — M9905 Segmental and somatic dysfunction of pelvic region: Secondary | ICD-10-CM | POA: Diagnosis not present

## 2016-08-24 DIAGNOSIS — G8929 Other chronic pain: Secondary | ICD-10-CM | POA: Diagnosis not present

## 2016-08-24 DIAGNOSIS — M9903 Segmental and somatic dysfunction of lumbar region: Secondary | ICD-10-CM | POA: Diagnosis not present

## 2016-08-24 DIAGNOSIS — M9902 Segmental and somatic dysfunction of thoracic region: Secondary | ICD-10-CM

## 2016-08-24 NOTE — Assessment & Plan Note (Signed)
In slight exacrbation, likely myofascial. I think she would benefit from OMT. Treated today as below. Call with any concerns.

## 2016-08-24 NOTE — Progress Notes (Signed)
BP 128/80 (BP Location: Right Arm, Patient Position: Sitting, Cuff Size: Large)   Pulse 96   Temp 98 F (36.7 C)   Wt 205 lb 1.6 oz (93 kg)   LMP  (LMP Unknown)   SpO2 100%   BMI 35.21 kg/m    Subjective:    Patient ID: Tracie Sheppard, female    DOB: 01/14/1956, 61 y.o.   MRN: [id number]  HPI: Tracie Sheppard is a 61 y.o. female  Chief Complaint  Patient presents with  . OMM   Was out of town for a conference last week. She notes that her back has been acting up since then because she was sleeping in a strange bed. She notes that she was running full steam. She notes that her low back is feeling tight and aching. With some pain in her hips. She is better with OMT and felt well until she was out of town after last appointment. She is worse with stress and sleeping in unfamiliar beds. She is otherwise feeling well with no other concerns or complaints at this time.   Relevant past medical, surgical, family and social history reviewed and updated as indicated. Interim medical history since our last visit reviewed. Allergies and medications reviewed and updated.  Review of Systems  Constitutional: Negative.   Respiratory: Negative.   Cardiovascular: Negative.   Musculoskeletal: Positive for back pain and myalgias. Negative for arthralgias, gait problem, joint swelling, neck pain and neck stiffness.  Psychiatric/Behavioral: Negative.     Per HPI unless specifically indicated above     Objective:    BP 128/80 (BP Location: Right Arm, Patient Position: Sitting, Cuff Size: Large)   Pulse 96   Temp 98 F (36.7 C)   Wt 205 lb 1.6 oz (93 kg)   LMP  (LMP Unknown)   SpO2 100%   BMI 35.21 kg/m   Wt Readings from Last 3 Encounters:  08/24/16 205 lb 1.6 oz (93 kg)  08/16/16 203 lb 6.4 oz (92.3 kg)  07/19/16 203 lb 4.8 oz (92.2 kg)    Physical Exam  Constitutional: She is oriented to person, place, and time. She appears well-developed and well-nourished. No distress.  HENT:    Head: Normocephalic and atraumatic.  Right Ear: Hearing normal.  Left Ear: Hearing normal.  Nose: Nose normal.  Eyes: Conjunctivae and lids are normal. Right eye exhibits no discharge. Left eye exhibits no discharge. No scleral icterus.  Pulmonary/Chest: Effort normal. No respiratory distress.  Abdominal: Soft. She exhibits no distension and no mass. There is no tenderness. There is no rebound and no guarding.  Neurological: She is alert and oriented to person, place, and time.  Skin: Skin is warm, dry and intact. No rash noted. No erythema. No pallor.  Psychiatric: She has a normal mood and affect. Her speech is normal and behavior is normal. Judgment and thought content normal. Cognition and memory are normal.  Nursing note and vitals reviewed. Musculoskeletal:  Exam found Decreased ROM, Tissue texture changes, Tenderness to palpation and Asymmetry of patient's  thorax, lumbar, pelvis, sacrum and lower extremity Osteopathic Structural Exam:   Thorax: T4-6SRRL, trap spasm bilaterally R>L  Lumbar: QL hypertonic on the R, psoas hypertonic on the R, L4ESRR  Pelvis: Anterior R innominate  Sacrum: L on L torsion  Lower Extremity: glut spasm on the R, hamstring hypertonic on the R   Results for orders placed or performed in visit on 08/16/16  Microscopic Examination  Result Value Ref Range  WBC, UA >30 (A) 0 - 5 /hpf   RBC, UA 0-2 0 - 2 /hpf   Epithelial Cells (non renal) 0-10 0 - 10 /hpf   Bacteria, UA None seen None seen/Few  UA/M w/rflx Culture, Routine  Result Value Ref Range   Specific Gravity, UA 1.025 1.005 - 1.030   pH, UA 5.5 5.0 - 7.5   Color, UA Yellow Yellow   Appearance Ur Cloudy (A) Clear   Leukocytes, UA 1+ (A) Negative   Protein, UA 2+ (A) Negative/Trace   Glucose, UA Negative Negative   Ketones, UA Trace (A) Negative   RBC, UA 3+ (A) Negative   Bilirubin, UA Negative Negative   Urobilinogen, Ur 0.2 0.2 - 1.0 mg/dL   Nitrite, UA Negative Negative   Microscopic  Examination See below:    Urinalysis Reflex Comment   Urine Culture, Routine  Result Value Ref Range   Urine Culture, Routine Final report (A)    Urine Culture result 1 Escherichia coli (A)    ANTIMICROBIAL SUSCEPTIBILITY Comment       Assessment & Plan:   Problem List Items Addressed This Visit      Other   Low back pain - Primary    In slight exacrbation, likely myofascial. I think she would benefit from OMT. Treated today as below. Call with any concerns.       Other Visit Diagnoses    Lumbar region somatic dysfunction       Sacral region somatic dysfunction       Somatic dysfunction of pelvis region       Thoracic segment dysfunction       Somatic dysfunction of lower extremity        After verbal consent was obtained, patient was treated today with osteopathic manipulative medicine to the regions of the thorax, lumbar, pelvis, sacrum and lower extremity using the techniques of myofascial release, muscle energy, HVLA and soft tissue. Areas of compensation relating to her primary pain source also treated. Patient tolerated the procedure well with good objective and good subjective improvement in symptoms. She left the room in good condition. She was advised to stay well hydrated and that she may have some soreness following the procedure. If not improving or worsening, she will call and come in.  She will return for reevaluation  in 1-2 months.    Follow up plan: Return in about 4 weeks (around 09/21/2016) for OMT eval.

## 2016-10-11 ENCOUNTER — Ambulatory Visit (INDEPENDENT_AMBULATORY_CARE_PROVIDER_SITE_OTHER): Payer: BC Managed Care – PPO | Admitting: Family Medicine

## 2016-10-11 ENCOUNTER — Encounter: Payer: Self-pay | Admitting: Family Medicine

## 2016-10-11 VITALS — BP 127/80 | HR 83 | Temp 97.9°F | Wt 204.2 lb

## 2016-10-11 DIAGNOSIS — M9903 Segmental and somatic dysfunction of lumbar region: Secondary | ICD-10-CM | POA: Diagnosis not present

## 2016-10-11 DIAGNOSIS — M545 Low back pain: Secondary | ICD-10-CM

## 2016-10-11 DIAGNOSIS — M9909 Segmental and somatic dysfunction of abdomen and other regions: Secondary | ICD-10-CM

## 2016-10-11 DIAGNOSIS — M9905 Segmental and somatic dysfunction of pelvic region: Secondary | ICD-10-CM | POA: Diagnosis not present

## 2016-10-11 DIAGNOSIS — M9902 Segmental and somatic dysfunction of thoracic region: Secondary | ICD-10-CM | POA: Diagnosis not present

## 2016-10-11 DIAGNOSIS — G8929 Other chronic pain: Secondary | ICD-10-CM

## 2016-10-11 DIAGNOSIS — M9904 Segmental and somatic dysfunction of sacral region: Secondary | ICD-10-CM | POA: Diagnosis not present

## 2016-10-11 DIAGNOSIS — M9908 Segmental and somatic dysfunction of rib cage: Secondary | ICD-10-CM

## 2016-10-11 NOTE — Progress Notes (Signed)
BP 127/80 (BP Location: Left Arm, Patient Position: Sitting, Cuff Size: Large)   Pulse 83   Temp 97.9 F (36.6 C)   Wt 204 lb 4 oz (92.6 kg)   LMP  (LMP Unknown)   SpO2 99%   BMI 35.06 kg/m    Subjective:    Patient ID: Tracie Sheppard, female    DOB: 1955/05/11, 61 y.o.   MRN: [id number]  HPI: Tracie Sheppard is a 61 y.o. female  Chief Complaint  Patient presents with  . Back Pain   Serene notes that her back is doing OK today. She notes that last week she was not doing well, she had been on vacation and had been sleeping on strange beds. She notes that she also had been flying and sitting in a strange seats. She notes that her back is feeling a little sore today. She notes that her pain is in her low back and R buttock and hip. She notes that her pain does not radiate. Her pain is better with OMT and stretching and worse with staying in one position for too long and travel. She did well after last time and has been feeling well with no other concerns or complaints at this time.   Relevant past medical, surgical, family and social history reviewed and updated as indicated. Interim medical history since our last visit reviewed. Allergies and medications reviewed and updated.  Review of Systems  Constitutional: Negative.   Respiratory: Negative.   Cardiovascular: Negative.   Musculoskeletal: Positive for arthralgias, back pain, gait problem and myalgias. Negative for joint swelling, neck pain and neck stiffness.  Psychiatric/Behavioral: Negative.    Per HPI unless specifically indicated above     Objective:    BP 127/80 (BP Location: Left Arm, Patient Position: Sitting, Cuff Size: Large)   Pulse 83   Temp 97.9 F (36.6 C)   Wt 204 lb 4 oz (92.6 kg)   LMP  (LMP Unknown)   SpO2 99%   BMI 35.06 kg/m   Wt Readings from Last 3 Encounters:  10/11/16 204 lb 4 oz (92.6 kg)  08/24/16 205 lb 1.6 oz (93 kg)  08/16/16 203 lb 6.4 oz (92.3 kg)    Physical Exam  Constitutional:  She is oriented to person, place, and time. She appears well-developed and well-nourished. No distress.  HENT:  Head: Normocephalic and atraumatic.  Right Ear: Hearing normal.  Left Ear: Hearing normal.  Nose: Nose normal.  Eyes: Conjunctivae and lids are normal. Right eye exhibits no discharge. Left eye exhibits no discharge. No scleral icterus.  Pulmonary/Chest: Effort normal. No respiratory distress.  Abdominal: Soft. She exhibits no distension and no mass. There is no tenderness. There is no rebound and no guarding.  Musculoskeletal: She exhibits tenderness. She exhibits no edema or deformity.  Neurological: She is alert and oriented to person, place, and time.  Skin: Skin is warm, dry and intact. No rash noted. She is not diaphoretic. No erythema. No pallor.  Psychiatric: She has a normal mood and affect. Her speech is normal and behavior is normal. Judgment and thought content normal. Cognition and memory are normal.  Musculoskeletal:  Exam found Decreased ROM, Tissue texture changes, Tenderness to palpation and Asymmetry of patient's  thorax, ribs, lumbar, pelvis, sacrum and abdomen Osteopathic Structural Exam:   Thorax: trap spasm bilaterally R>L, T3-5SLRR  Ribs: Ribs 5-6 locked up on the R  Lumbar: QL hypertonic on the R, Psoas spasm on the R, L3-5SLRR  Pelvis: Anterior  R innominate  Sacrum: R on R torsion  Abdomen: pelvic diaphragm restricted and pulling to the L, diaphragm restricted  R>L   Results for orders placed or performed in visit on 08/16/16  Microscopic Examination  Result Value Ref Range   WBC, UA >30 (A) 0 - 5 /hpf   RBC, UA 0-2 0 - 2 /hpf   Epithelial Cells (non renal) 0-10 0 - 10 /hpf   Bacteria, UA None seen None seen/Few  UA/M w/rflx Culture, Routine  Result Value Ref Range   Specific Gravity, UA 1.025 1.005 - 1.030   pH, UA 5.5 5.0 - 7.5   Color, UA Yellow Yellow   Appearance Ur Cloudy (A) Clear   Leukocytes, UA 1+ (A) Negative   Protein, UA 2+ (A)  Negative/Trace   Glucose, UA Negative Negative   Ketones, UA Trace (A) Negative   RBC, UA 3+ (A) Negative   Bilirubin, UA Negative Negative   Urobilinogen, Ur 0.2 0.2 - 1.0 mg/dL   Nitrite, UA Negative Negative   Microscopic Examination See below:    Urinalysis Reflex Comment   Urine Culture, Routine  Result Value Ref Range   Urine Culture, Routine Final report (A)    Organism ID, Bacteria Escherichia coli (A)    Antimicrobial Susceptibility Comment       Assessment & Plan:   Problem List Items Addressed This Visit      Other   Low back pain - Primary    Was in slight exacerbation about a week ago due to traveling, slightly better now, likely myofascial. I think she would benefit from OMT. Treated today as below. Call with any concerns. Follow up on a PRN basis       Other Visit Diagnoses    Lumbar region somatic dysfunction       Sacral region somatic dysfunction       Somatic dysfunction of pelvis region       Thoracic segment dysfunction       Somatic dysfunction of abdominal region       Rib cage region somatic dysfunction         After verbal consent was obtained, patient was treated today with osteopathic manipulative medicine to the regions of the thorax, ribs, lumbar, pelvis, sacrum and abdomen using the techniques of myofascial release, counterstrain, muscle energy and soft tissue. Areas of compensation relating to her primary pain source also treated. Patient tolerated the procedure well with good objective and good subjective improvement in symptoms. She left the room in good condition. She was advised to stay well hydrated and that she may have some soreness following the procedure. If not improving or worsening, she will call and come in. She will return for reevaluation  on a PRN basis.   Follow up plan: Return in about 4 weeks (around 11/08/2016) for 6 month follow up with labs.

## 2016-10-11 NOTE — Assessment & Plan Note (Signed)
Was in slight exacerbation about a week ago due to traveling, slightly better now, likely myofascial. I think she would benefit from OMT. Treated today as below. Call with any concerns. Follow up on a PRN basis

## 2016-11-02 ENCOUNTER — Ambulatory Visit (INDEPENDENT_AMBULATORY_CARE_PROVIDER_SITE_OTHER): Payer: BC Managed Care – PPO | Admitting: Family Medicine

## 2016-11-02 ENCOUNTER — Encounter: Payer: Self-pay | Admitting: Family Medicine

## 2016-11-02 VITALS — BP 115/77 | HR 79 | Temp 97.8°F | Ht 65.2 in | Wt 203.1 lb

## 2016-11-02 DIAGNOSIS — E782 Mixed hyperlipidemia: Secondary | ICD-10-CM

## 2016-11-02 DIAGNOSIS — I10 Essential (primary) hypertension: Secondary | ICD-10-CM | POA: Diagnosis not present

## 2016-11-02 DIAGNOSIS — R7301 Impaired fasting glucose: Secondary | ICD-10-CM

## 2016-11-02 DIAGNOSIS — R319 Hematuria, unspecified: Secondary | ICD-10-CM

## 2016-11-02 DIAGNOSIS — F419 Anxiety disorder, unspecified: Secondary | ICD-10-CM

## 2016-11-02 DIAGNOSIS — E079 Disorder of thyroid, unspecified: Secondary | ICD-10-CM | POA: Diagnosis not present

## 2016-11-02 DIAGNOSIS — Z0000 Encounter for general adult medical examination without abnormal findings: Secondary | ICD-10-CM | POA: Diagnosis not present

## 2016-11-02 LAB — HEMOGLOBIN A1C: HEMOGLOBIN A1C: 5.6

## 2016-11-02 MED ORDER — BUPROPION HCL ER (SR) 150 MG PO TB12: ORAL_TABLET | ORAL | 3 refills | Status: DC

## 2016-11-02 MED ORDER — CITALOPRAM HYDROBROMIDE 40 MG PO TABS: 40.0000 mg | ORAL_TABLET | Freq: Every day | ORAL | 1 refills | Status: DC

## 2016-11-02 MED ORDER — SPIRONOLACTONE 100 MG PO TABS: 100.0000 mg | ORAL_TABLET | Freq: Two times a day (BID) | ORAL | 1 refills | Status: DC

## 2016-11-02 NOTE — Progress Notes (Signed)
BP 115/77 (BP Location: Left Arm, Patient Position: Sitting, Cuff Size: Large)   Pulse 79   Temp 97.8 F (36.6 C)   Ht 5' 5.2" (1.656 m)   Wt 203 lb 2 oz (92.1 kg)   LMP  (LMP Unknown)   SpO2 98%   BMI 33.59 kg/m    Subjective:    Patient ID: Tracie Sheppard, female    DOB: Feb 22, 1956, 61 y.o.   MRN: [id number]  HPI: Tracie Sheppard is a 61 y.o. female presenting on 11/02/2016 for comprehensive medical examination. Current medical complaints include:  HYPERTENSION / HYPERLIPIDEMIA- has stopped her carvedilol Satisfied with current treatment? yes Duration of hypertension: chronic BP monitoring frequency: not checking BP medication side effects: no Past BP meds: carvedilol, spironalactone Duration of hyperlipidemia: chronic Cholesterol medication side effects: Red yeast rice- not taking it daily Cholesterol supplements: fish oil and red yeast rice Past cholesterol medications: none Medication compliance: fair compliance Aspirin: no Recent stressors: yes Recurrent headaches: no Visual changes: no Palpitations: yes Dyspnea: no Chest pain: no Lower extremity edema: no Dizzy/lightheaded: no  Impaired Fasting Glucose HbA1C:  Lab Results  Component Value Date   HGBA1C 5.6 11/02/2016   Duration of elevated blood sugar: chronic Polydipsia: no Polyuria: no Weight change: no Visual disturbance: no Glucose Monitoring: no Family history of diabetes: yes  HYPOTHYROIDISM Thyroid control status:stable Satisfied with current treatment? yes Medication side effects: no Medication compliance: good compliance Recent dose adjustment:no Fatigue: yes Cold intolerance: no Heat intolerance: yes Weight gain: no Weight loss: no Constipation: yes Diarrhea/loose stools: yes Palpitations: yes Lower extremity edema: no Anxiety/depressed mood: yes  ANXIETY/STRESS Duration:stable Anxious mood: yes  Excessive worrying: yes Irritability: no  Sweating: no Nausea:  no Palpitations:no Hyperventilation: no Panic attacks: no Agoraphobia: no  Obscessions/compulsions: no Depressed mood: yes Depression screen PHQ 2/9 11/02/2016  Decreased Interest 1  Down, Depressed, Hopeless 0  PHQ - 2 Score 1  Altered sleeping 3  Tired, decreased energy 3  Change in appetite 3  Trouble concentrating 0  Moving slowly or fidgety/restless 0  Suicidal thoughts 0  PHQ-9 Score 10   Anhedonia: no Weight changes: no Insomnia: no   Hypersomnia: no Fatigue/loss of energy: yes Feelings of worthlessness: no Feelings of guilt: yes Impaired concentration/indecisiveness: no Suicidal ideations: no  Crying spells: no Recent Stressors/Life Changes: yes   Relationship problems: no   Family stress: yes     Financial stress: no    Job stress: no    Recent death/loss: no  She currently lives with: husband, daughter, grandson Menopausal Symptoms: no  Depression Screen done today and results listed below:  Depression screen [facility] 2/9 11/02/2016  Decreased Interest 1  Down, Depressed, Hopeless 0  PHQ - 2 Score 1  Altered sleeping 3  Tired, decreased energy 3  Change in appetite 3  Trouble concentrating 0  Moving slowly or fidgety/restless 0  Suicidal thoughts 0  PHQ-9 Score 10    Past Medical History:  Past Medical History:  Diagnosis Date  . Anxiety   . Diabetes mellitus without complication ([place])    Prediabetic  . High serum testosterone   . Kidney stone   . Thyroid disease    did not show up in lab work but per other symptoms previous doctor started her on this    Surgical History:  Past Surgical History:  Procedure Laterality Date  . CHOLECYSTECTOMY     1997  . NASAL SEPTUM SURGERY    . OOPHORECTOMY    .  TUBAL LIGATION     1989    Medications:  Current Outpatient Prescriptions on File Prior to Visit  Medication Sig  . clonazePAM (KLONOPIN) 0.5 MG tablet TAKE 1 TABLET BY MOUTH DAILY AS NEEDED FOR ANIXETY  . Eflornithine HCl (VANIQA) 13.9 % cream  Apply topically 2 (two) times daily with a meal.  . fluticasone (FLONASE) 50 MCG/ACT nasal spray 1 spray by Each Nare route daily.  Marland Kitchen ibuprofen (GOODSENSE IBUPROFEN) 200 MG tablet Take by mouth.  . meloxicam (MOBIC) 7.5 MG tablet   . Omega-3 Fatty Acids (ULTRA OMEGA-3 FISH OIL) 1400 MG CAPS Take 1,400 mg by mouth 2 (two) times daily.  . Red Yeast Rice Extract (RED YEAST RICE PO) Take 1,200 mg by mouth daily.  Marland Kitchen thyroid (ARMOUR THYROID) 60 MG tablet TAKE 1 TABLET BY MOUTH EVERY DAY BEFORE BREAKFAST   No current facility-administered medications on file prior to visit.     Allergies:  Allergies  Allergen Reactions  . Macrolides And Ketolides Other (See Comments)    unknown    Social History:  Social History   Social History  . Marital status: Married    Spouse name: N/A  . Number of children: N/A  . Years of education: N/A   Occupational History  . Not on file.   Social History Main Topics  . Smoking status: Never Smoker  . Smokeless tobacco: Never Used  . Alcohol use Yes     Comment: On occasion  . Drug use: No  . Sexual activity: Yes    Birth control/ protection: None   Other Topics Concern  . Not on file   Social History Narrative  . No narrative on file   History  Smoking Status  . Never Smoker  Smokeless Tobacco  . Never Used   History  Alcohol Use  . Yes    Comment: On occasion    Family History:  Family History  Problem Relation Age of Onset  . Diabetes Mother   . Anemia Mother   . Hyperlipidemia Mother   . Hypertension Father   . Cancer Father   . Celiac disease Sister   . Diabetes Maternal Grandmother   . Stroke Maternal Grandmother   . Heart attack Maternal Grandmother   . Heart disease Paternal Grandfather     Past medical history, surgical history, medications, allergies, family history and social history reviewed with patient today and changes made to appropriate areas of the chart.   Review of Systems  Constitutional: Negative.     HENT: Negative.   Eyes: Negative.   Respiratory: Negative.   Cardiovascular: Negative.   Gastrointestinal: Positive for constipation, diarrhea and heartburn (controlled with tums). Negative for abdominal pain, blood in stool, melena, nausea and vomiting.  Genitourinary: Negative.   Musculoskeletal: Positive for neck pain. Negative for back pain, falls, joint pain and myalgias.  Skin: Negative.   Neurological: Positive for tingling. Negative for dizziness, tremors, sensory change, speech change, focal weakness, seizures, loss of consciousness and headaches.  Endo/Heme/Allergies: Negative.   Psychiatric/Behavioral: Positive for depression. Negative for hallucinations, memory loss, substance abuse and suicidal ideas. The patient is not nervous/anxious and does not have insomnia.     All other ROS negative except what is listed above and in the HPI.      Objective:    BP 115/77 (BP Location: Left Arm, Patient Position: Sitting, Cuff Size: Large)   Pulse 79   Temp 97.8 F (36.6 C)   Ht 5' 5.2" (1.656 m)  Wt 203 lb 2 oz (92.1 kg)   LMP  (LMP Unknown)   SpO2 98%   BMI 33.59 kg/m   Wt Readings from Last 3 Encounters:  11/02/16 203 lb 2 oz (92.1 kg)  10/11/16 204 lb 4 oz (92.6 kg)  08/24/16 205 lb 1.6 oz (93 kg)    Physical Exam  Constitutional: She is oriented to person, place, and time. She appears well-developed and well-nourished. No distress.  HENT:  Head: Normocephalic and atraumatic.  Right Ear: Hearing and external ear normal.  Left Ear: Hearing and external ear normal.  Nose: Nose normal.  Mouth/Throat: Oropharynx is clear and moist. No oropharyngeal exudate.  Eyes: Pupils are equal, round, and reactive to light. Conjunctivae, EOM and lids are normal. Right eye exhibits no discharge. Left eye exhibits no discharge. No scleral icterus.  Neck: Normal range of motion. Neck supple. No JVD present. No tracheal deviation present. No thyromegaly present.  Cardiovascular: Normal  rate, regular rhythm, normal heart sounds and intact distal pulses.  Exam reveals no gallop and no friction rub.   No murmur heard. Pulmonary/Chest: Effort normal and breath sounds normal. No stridor. No respiratory distress. She has no wheezes. She has no rales. She exhibits no tenderness.  Abdominal: Soft. Bowel sounds are normal. She exhibits no distension and no mass. There is no tenderness. There is no rebound and no guarding.  Genitourinary:  Genitourinary Comments: Breast and pelvic exams deferred- done at GYN  Musculoskeletal: Normal range of motion. She exhibits no edema, tenderness or deformity.  Lymphadenopathy:    She has no cervical adenopathy.  Neurological: She is alert and oriented to person, place, and time. She has normal reflexes. She displays normal reflexes. No cranial nerve deficit. She exhibits normal muscle tone. Coordination normal.  Skin: Skin is warm, dry and intact. No rash noted. She is not diaphoretic. No erythema. No pallor.  Psychiatric: She has a normal mood and affect. Her speech is normal and behavior is normal. Judgment and thought content normal. Cognition and memory are normal.  Nursing note and vitals reviewed.   Results for orders placed or performed in visit on 11/02/16  HM MAMMOGRAPHY  Result Value Ref Range   HM Mammogram 0-4 Bi-Rad 0-4 Bi-Rad, Self Reported Normal  Hemoglobin A1c  Result Value Ref Range   Hemoglobin A1C 5.6       Assessment & Plan:   Problem List Items Addressed This Visit      Cardiovascular and Mediastinum   HTN (hypertension)    Under good control. Continue current regimen. Continue to monitor. Call with any concerns. Refills given today.      Relevant Medications   spironolactone (ALDACTONE) 100 MG tablet   Other Relevant Orders   CBC with Differential/Platelet   Comprehensive metabolic panel   Microalbumin, Urine Waived     Endocrine   Thyroid disease    Rechecking levels today. Await results. Treat as needed.        Relevant Orders   CBC with Differential/Platelet   Comprehensive metabolic panel   TSH   IFG (impaired fasting glucose)    Under good control with A1c of 5.6. She would like to stop metformin. Stopped today. Will recheck A1c in 3 months.       Relevant Orders   Bayer DCA Hb A1c Waived   CBC with Differential/Platelet   Comprehensive metabolic panel   Microalbumin, Urine Waived     Other   Hematuria    Rechecking urine today. Await results.  Call with any concerns.       Relevant Orders   CBC with Differential/Platelet   Comprehensive metabolic panel   UA/M w/rflx Culture, Routine   Acute anxiety    Not under great control. Will add wellbutrin and continue celexa. Recheck 1 month. Call with any concerns.       Relevant Medications   buPROPion (WELLBUTRIN SR) 150 MG 12 hr tablet   citalopram (CELEXA) 40 MG tablet   Other Relevant Orders   CBC with Differential/Platelet   Comprehensive metabolic panel   Hyperlipidemia    Checking labs today. Await results. Call with any concerns.       Relevant Medications   spironolactone (ALDACTONE) 100 MG tablet   Other Relevant Orders   CBC with Differential/Platelet   Comprehensive metabolic panel   Lipid Panel w/o Chol/HDL Ratio    Other Visit Diagnoses    Routine general medical examination at a health care facility    -  Primary   Relevant Orders   Bayer DCA Hb A1c Waived   CBC with Differential/Platelet   Comprehensive metabolic panel   Lipid Panel w/o Chol/HDL Ratio   Microalbumin, Urine Waived   TSH   UA/M w/rflx Culture, Routine       Follow up plan: Return in about 4 weeks (around 11/30/2016) for Follow up mood.   LABORATORY TESTING:  - Pap smear: done elsewhere  IMMUNIZATIONS:   - Tdap: Tetanus vaccination status reviewed: last tetanus booster within 10 years. - Influenza: Postponed to flu season - Pneumovax: Not applicable - Prevnar: Not applicable - Zostavax vaccine: Will check with her  insurance  SCREENING: -Mammogram: Up to date  - Colonoscopy: Up to date  - Bone Density: Not applicable   PATIENT COUNSELING:   Advised to take 1 mg of folate supplement per day if capable of pregnancy.   Sexuality: Discussed sexually transmitted diseases, partner selection, use of condoms, avoidance of unintended pregnancy  and contraceptive alternatives.   Advised to avoid cigarette smoking.  I discussed with the patient that most people either abstain from alcohol or drink within safe limits (<=14/week and <=4 drinks/occasion for males, <=7/weeks and <= 3 drinks/occasion for females) and that the risk for alcohol disorders and other health effects rises proportionally with the number of drinks per week and how often a drinker exceeds daily limits.  Discussed cessation/primary prevention of drug use and availability of treatment for abuse.   Diet: Encouraged to adjust caloric intake to maintain  or achieve ideal body weight, to reduce intake of dietary saturated fat and total fat, to limit sodium intake by avoiding high sodium foods and not adding table salt, and to maintain adequate dietary potassium and calcium preferably from fresh fruits, vegetables, and low-fat dairy products.    stressed the importance of regular exercise  Injury prevention: Discussed safety belts, safety helmets, smoke detector, smoking near bedding or upholstery.   Dental health: Discussed importance of regular tooth brushing, flossing, and dental visits.    NEXT PREVENTATIVE PHYSICAL DUE IN 1 YEAR. Return in about 4 weeks (around 11/30/2016) for Follow up mood.

## 2016-11-02 NOTE — Assessment & Plan Note (Signed)
Checking labs today. Await results. Call with any concerns.  

## 2016-11-02 NOTE — Assessment & Plan Note (Signed)
Not under great control. Will add wellbutrin and continue celexa. Recheck 1 month. Call with any concerns.

## 2016-11-02 NOTE — Assessment & Plan Note (Signed)
Under good control. Continue current regimen. Continue to monitor. Call with any concerns. Refills given today. 

## 2016-11-02 NOTE — Assessment & Plan Note (Signed)
Under good control with A1c of 5.6. She would like to stop metformin. Stopped today. Will recheck A1c in 3 months.

## 2016-11-02 NOTE — Assessment & Plan Note (Signed)
Rechecking levels today. Await results. Treat as needed.  

## 2016-11-02 NOTE — Patient Instructions (Addendum)
Health Maintenance, Female Adopting a healthy lifestyle and getting preventive care can go a long way to promote health and wellness. Talk with your health care provider about what schedule of regular examinations is right for you. This is a good chance for you to check in with your provider about disease prevention and staying healthy. In between checkups, there are plenty of things you can do on your own. Experts have done a lot of research about which lifestyle changes and preventive measures are most likely to keep you healthy. Ask your health care provider for more information. Weight and diet Eat a healthy diet  Be sure to include plenty of vegetables, fruits, low-fat dairy products, and lean protein.  Do not eat a lot of foods high in solid fats, added sugars, or salt.  Get regular exercise. This is one of the most important things you can do for your health. ? Most adults should exercise for at least 150 minutes each week. The exercise should increase your heart rate and make you sweat (moderate-intensity exercise). ? Most adults should also do strengthening exercises at least twice a week. This is in addition to the moderate-intensity exercise.  Maintain a healthy weight  Body mass index (BMI) is a measurement that can be used to identify possible weight problems. It estimates body fat based on height and weight. Your health care provider can help determine your BMI and help you achieve or maintain a healthy weight.  For females 20 years of age and older: ? A BMI below 18.5 is considered underweight. ? A BMI of 18.5 to 24.9 is normal. ? A BMI of 25 to 29.9 is considered overweight. ? A BMI of 30 and above is considered obese.  Watch levels of cholesterol and blood lipids  You should start having your blood tested for lipids and cholesterol at 59 years of age, then have this test every 5 years.  You may need to have your cholesterol levels checked more often if: ? Your lipid or  cholesterol levels are high. ? You are older than 59 years of age. ? You are at high risk for heart disease.  Cancer screening Lung Cancer  Lung cancer screening is recommended for adults 55-80 years old who are at high risk for lung cancer because of a history of smoking.  A yearly low-dose CT scan of the lungs is recommended for people who: ? Currently smoke. ? Have quit within the past 15 years. ? Have at least a 30-pack-year history of smoking. A pack year is smoking an average of one pack of cigarettes a day for 1 year.  Yearly screening should continue until it has been 15 years since you quit.  Yearly screening should stop if you develop a health problem that would prevent you from having lung cancer treatment.  Breast Cancer  Practice breast self-awareness. This means understanding how your breasts normally appear and feel.  It also means doing regular breast self-exams. Let your health care provider know about any changes, no matter how small.  If you are in your 20s or 30s, you should have a clinical breast exam (CBE) by a health care provider every 1-3 years as part of a regular health exam.  If you are 40 or older, have a CBE every year. Also consider having a breast X-ray (mammogram) every year.  If you have a family history of breast cancer, talk to your health care provider about genetic screening.  If you are at high risk   for breast cancer, talk to your health care provider about having an MRI and a mammogram every year.  Breast cancer gene (BRCA) assessment is recommended for women who have family members with BRCA-related cancers. BRCA-related cancers include: ? Breast. ? Ovarian. ? Tubal. ? Peritoneal cancers.  Results of the assessment will determine the need for genetic counseling and BRCA1 and BRCA2 testing.  Cervical Cancer Your health care provider may recommend that you be screened regularly for cancer of the pelvic organs (ovaries, uterus, and  vagina). This screening involves a pelvic examination, including checking for microscopic changes to the surface of your cervix (Pap test). You may be encouraged to have this screening done every 3 years, beginning at age 22.  For women ages 56-65, health care providers may recommend pelvic exams and Pap testing every 3 years, or they may recommend the Pap and pelvic exam, combined with testing for human papilloma virus (HPV), every 5 years. Some types of HPV increase your risk of cervical cancer. Testing for HPV may also be done on women of any age with unclear Pap test results.  Other health care providers may not recommend any screening for nonpregnant women who are considered low risk for pelvic cancer and who do not have symptoms. Ask your health care provider if a screening pelvic exam is right for you.  If you have had past treatment for cervical cancer or a condition that could lead to cancer, you need Pap tests and screening for cancer for at least 20 years after your treatment. If Pap tests have been discontinued, your risk factors (such as having a new sexual partner) need to be reassessed to determine if screening should resume. Some women have medical problems that increase the chance of getting cervical cancer. In these cases, your health care provider may recommend more frequent screening and Pap tests.  Colorectal Cancer  This type of cancer can be detected and often prevented.  Routine colorectal cancer screening usually begins at 59 years of age and continues through 59 years of age.  Your health care provider may recommend screening at an earlier age if you have risk factors for colon cancer.  Your health care provider may also recommend using home test kits to check for hidden blood in the stool.  A small camera at the end of a tube can be used to examine your colon directly (sigmoidoscopy or colonoscopy). This is done to check for the earliest forms of colorectal  cancer.  Routine screening usually begins at age 33.  Direct examination of the colon should be repeated every 5-10 years through 59 years of age. However, you may need to be screened more often if early forms of precancerous polyps or small growths are found.  Skin Cancer  Check your skin from head to toe regularly.  Tell your health care provider about any new moles or changes in moles, especially if there is a change in a mole's shape or color.  Also tell your health care provider if you have a mole that is larger than the size of a pencil eraser.  Always use sunscreen. Apply sunscreen liberally and repeatedly throughout the day.  Protect yourself by wearing long sleeves, pants, a wide-brimmed hat, and sunglasses whenever you are outside.  Heart disease, diabetes, and high blood pressure  High blood pressure causes heart disease and increases the risk of stroke. High blood pressure is more likely to develop in: ? People who have blood pressure in the high end of  the normal range (130-139/85-89 mm Hg). ? People who are overweight or obese. ? People who are African American.  If you are 21-29 years of age, have your blood pressure checked every 3-5 years. If you are 3 years of age or older, have your blood pressure checked every year. You should have your blood pressure measured twice-once when you are at a hospital or clinic, and once when you are not at a hospital or clinic. Record the average of the two measurements. To check your blood pressure when you are not at a hospital or clinic, you can use: ? An automated blood pressure machine at a pharmacy. ? A home blood pressure monitor.  If you are between 17 years and 37 years old, ask your health care provider if you should take aspirin to prevent strokes.  Have regular diabetes screenings. This involves taking a blood sample to check your fasting blood sugar level. ? If you are at a normal weight and have a low risk for diabetes,  have this test once every three years after 59 years of age. ? If you are overweight and have a high risk for diabetes, consider being tested at a younger age or more often. Preventing infection Hepatitis B  If you have a higher risk for hepatitis B, you should be screened for this virus. You are considered at high risk for hepatitis B if: ? You were born in a country where hepatitis B is common. Ask your health care provider which countries are considered high risk. ? Your parents were born in a high-risk country, and you have not been immunized against hepatitis B (hepatitis B vaccine). ? You have HIV or AIDS. ? You use needles to inject street drugs. ? You live with someone who has hepatitis B. ? You have had sex with someone who has hepatitis B. ? You get hemodialysis treatment. ? You take certain medicines for conditions, including cancer, organ transplantation, and autoimmune conditions.  Hepatitis C  Blood testing is recommended for: ? Everyone born from [birth year] through 1963. ? Anyone with known risk factors for hepatitis C.  Sexually transmitted infections (STIs)  You should be screened for sexually transmitted infections (STIs) including gonorrhea and chlamydia if: ? You are sexually active and are younger than 59 years of age. ? You are older than 59 years of age and your health care provider tells you that you are at risk for this type of infection. ? Your sexual activity has changed since you were last screened and you are at an increased risk for chlamydia or gonorrhea. Ask your health care provider if you are at risk.  If you do not have HIV, but are at risk, it may be recommended that you take a prescription medicine daily to prevent HIV infection. This is called pre-exposure prophylaxis (PrEP). You are considered at risk if: ? You are sexually active and do not regularly use condoms or know the HIV status of your partner(s). ? You take drugs by injection. ? You are  sexually active with a partner who has HIV.  Talk with your health care provider about whether you are at high risk of being infected with HIV. If you choose to begin PrEP, you should first be tested for HIV. You should then be tested every 3 months for as long as you are taking PrEP. Pregnancy  If you are premenopausal and you may become pregnant, ask your health care provider about preconception counseling.  If you may become  pregnant, take 400 to 800 micrograms (mcg) of folic acid every day.  If you want to prevent pregnancy, talk to your health care provider about birth control (contraception). Osteoporosis and menopause  Osteoporosis is a disease in which the bones lose minerals and strength with aging. This can result in serious bone fractures. Your risk for osteoporosis can be identified using a bone density scan.  If you are 28 years of age or older, or if you are at risk for osteoporosis and fractures, ask your health care provider if you should be screened.  Ask your health care provider whether you should take a calcium or vitamin D supplement to lower your risk for osteoporosis.  Menopause may have certain physical symptoms and risks.  Hormone replacement therapy may reduce some of these symptoms and risks. Talk to your health care provider about whether hormone replacement therapy is right for you. Follow these instructions at home:  Schedule regular health, dental, and eye exams.  Stay current with your immunizations.  Do not use any tobacco products including cigarettes, chewing tobacco, or electronic cigarettes.  If you are pregnant, do not drink alcohol.  If you are breastfeeding, limit how much and how often you drink alcohol.  Limit alcohol intake to no more than 1 drink per day for nonpregnant women. One drink equals 12 ounces of beer, 5 ounces of wine, or 1 ounces of hard liquor.  Do not use street drugs.  Do not share needles.  Ask your health care  provider for help if you need support or information about quitting drugs.  Tell your health care provider if you often feel depressed.  Tell your health care provider if you have ever been abused or do not feel safe at home. This information is not intended to replace advice given to you by your health care provider. Make sure you discuss any questions you have with your health care provider. Document Released: 10/04/2010 Document Revised: 08/27/2015 Document Reviewed: 12/23/2014 Elsevier Interactive Patient Education  2018 [place] Maintenance for Postmenopausal Women Menopause is a normal process in which your reproductive ability comes to an end. This process happens gradually over a span of months to years, usually between the ages of 96 and 42. Menopause is complete when you have missed 12 consecutive menstrual periods. It is important to talk with your health care provider about some of the most common conditions that affect postmenopausal women, such as heart disease, cancer, and bone loss (osteoporosis). Adopting a healthy lifestyle and getting preventive care can help to promote your health and wellness. Those actions can also lower your chances of developing some of these common conditions. What should I know about menopause? During menopause, you may experience a number of symptoms, such as:  Moderate-to-severe hot flashes.  Night sweats.  Decrease in sex drive.  Mood swings.  Headaches.  Tiredness.  Irritability.  Memory problems.  Insomnia.  Choosing to treat or not to treat menopausal changes is an individual decision that you make with your health care provider. What should I know about hormone replacement therapy and supplements? Hormone therapy products are effective for treating symptoms that are associated with menopause, such as hot flashes and night sweats. Hormone replacement carries certain risks, especially as you become older. If you are  thinking about using estrogen or estrogen with progestin treatments, discuss the benefits and risks with your health care provider. What should I know about heart disease and stroke? Heart disease, heart attack, and stroke  become more likely as you age. This may be due, in part, to the hormonal changes that your body experiences during menopause. These can affect how your body processes dietary fats, triglycerides, and cholesterol. Heart attack and stroke are both medical emergencies. There are many things that you can do to help prevent heart disease and stroke:  Have your blood pressure checked at least every 1-2 years. High blood pressure causes heart disease and increases the risk of stroke.  If you are 67-63 years old, ask your health care provider if you should take aspirin to prevent a heart attack or a stroke.  Do not use any tobacco products, including cigarettes, chewing tobacco, or electronic cigarettes. If you need help quitting, ask your health care provider.  It is important to eat a healthy diet and maintain a healthy weight. ? Be sure to include plenty of vegetables, fruits, low-fat dairy products, and lean protein. ? Avoid eating foods that are high in solid fats, added sugars, or salt (sodium).  Get regular exercise. This is one of the most important things that you can do for your health. ? Try to exercise for at least 150 minutes each week. The type of exercise that you do should increase your heart rate and make you sweat. This is known as moderate-intensity exercise. ? Try to do strengthening exercises at least twice each week. Do these in addition to the moderate-intensity exercise.  Know your numbers.Ask your health care provider to check your cholesterol and your blood glucose. Continue to have your blood tested as directed by your health care provider.  What should I know about cancer screening? There are several types of cancer. Take the following steps to reduce  your risk and to catch any cancer development as early as possible. Breast Cancer  Practice breast self-awareness. ? This means understanding how your breasts normally appear and feel. ? It also means doing regular breast self-exams. Let your health care provider know about any changes, no matter how small.  If you are 66 or older, have a clinician do a breast exam (clinical breast exam or CBE) every year. Depending on your age, family history, and medical history, it may be recommended that you also have a yearly breast X-ray (mammogram).  If you have a family history of breast cancer, talk with your health care provider about genetic screening.  If you are at high risk for breast cancer, talk with your health care provider about having an MRI and a mammogram every year.  Breast cancer (BRCA) gene test is recommended for women who have family members with BRCA-related cancers. Results of the assessment will determine the need for genetic counseling and BRCA1 and for BRCA2 testing. BRCA-related cancers include these types: ? Breast. This occurs in males or females. ? Ovarian. ? Tubal. This may also be called fallopian tube cancer. ? Cancer of the abdominal or pelvic lining (peritoneal cancer). ? Prostate. ? Pancreatic.  Cervical, Uterine, and Ovarian Cancer Your health care provider may recommend that you be screened regularly for cancer of the pelvic organs. These include your ovaries, uterus, and vagina. This screening involves a pelvic exam, which includes checking for microscopic changes to the surface of your cervix (Pap test).  For women ages 21-65, health care providers may recommend a pelvic exam and a Pap test every three years. For women ages 65-65, they may recommend the Pap test and pelvic exam, combined with testing for human papilloma virus (HPV), every five years. Some types  of HPV increase your risk of cervical cancer. Testing for HPV may also be done on women of any age who  have unclear Pap test results.  Other health care providers may not recommend any screening for nonpregnant women who are considered low risk for pelvic cancer and have no symptoms. Ask your health care provider if a screening pelvic exam is right for you.  If you have had past treatment for cervical cancer or a condition that could lead to cancer, you need Pap tests and screening for cancer for at least 20 years after your treatment. If Pap tests have been discontinued for you, your risk factors (such as having a new sexual partner) need to be reassessed to determine if you should start having screenings again. Some women have medical problems that increase the chance of getting cervical cancer. In these cases, your health care provider may recommend that you have screening and Pap tests more often.  If you have a family history of uterine cancer or ovarian cancer, talk with your health care provider about genetic screening.  If you have vaginal bleeding after reaching menopause, tell your health care provider.  There are currently no reliable tests available to screen for ovarian cancer.  Lung Cancer Lung cancer screening is recommended for adults 20-34 years old who are at high risk for lung cancer because of a history of smoking. A yearly low-dose CT scan of the lungs is recommended if you:  Currently smoke.  Have a history of at least 30 pack-years of smoking and you currently smoke or have quit within the past 15 years. A pack-year is smoking an average of one pack of cigarettes per day for one year.  Yearly screening should:  Continue until it has been 15 years since you quit.  Stop if you develop a health problem that would prevent you from having lung cancer treatment.  Colorectal Cancer  This type of cancer can be detected and can often be prevented.  Routine colorectal cancer screening usually begins at age 91 and continues through age 40.  If you have risk factors for colon  cancer, your health care provider may recommend that you be screened at an earlier age.  If you have a family history of colorectal cancer, talk with your health care provider about genetic screening.  Your health care provider may also recommend using home test kits to check for hidden blood in your stool.  A small camera at the end of a tube can be used to examine your colon directly (sigmoidoscopy or colonoscopy). This is done to check for the earliest forms of colorectal cancer.  Direct examination of the colon should be repeated every 5-10 years until age 59. However, if early forms of precancerous polyps or small growths are found or if you have a family history or genetic risk for colorectal cancer, you may need to be screened more often.  Skin Cancer  Check your skin from head to toe regularly.  Monitor any moles. Be sure to tell your health care provider: ? About any new moles or changes in moles, especially if there is a change in a mole's shape or color. ? If you have a mole that is larger than the size of a pencil eraser.  If any of your family members has a history of skin cancer, especially at a young age, talk with your health care provider about genetic screening.  Always use sunscreen. Apply sunscreen liberally and repeatedly throughout the day.  Whenever you are outside, protect yourself by wearing long sleeves, pants, a wide-brimmed hat, and sunglasses.  What should I know about osteoporosis? Osteoporosis is a condition in which bone destruction happens more quickly than new bone creation. After menopause, you may be at an increased risk for osteoporosis. To help prevent osteoporosis or the bone fractures that can happen because of osteoporosis, the following is recommended:  If you are 13-49 years old, get at least 1,000 mg of calcium and at least 600 mg of vitamin D per day.  If you are older than age 83 but younger than age 38, get at least 1,200 mg of calcium and  at least 600 mg of vitamin D per day.  If you are older than age 77, get at least 1,200 mg of calcium and at least 800 mg of vitamin D per day.  Smoking and excessive alcohol intake increase the risk of osteoporosis. Eat foods that are rich in calcium and vitamin D, and do weight-bearing exercises several times each week as directed by your health care provider. What should I know about how menopause affects my mental health? Depression may occur at any age, but it is more common as you become older. Common symptoms of depression include:  Low or sad mood.  Changes in sleep patterns.  Changes in appetite or eating patterns.  Feeling an overall lack of motivation or enjoyment of activities that you previously enjoyed.  Frequent crying spells.  Talk with your health care provider if you think that you are experiencing depression. What should I know about immunizations? It is important that you get and maintain your immunizations. These include:  Tetanus, diphtheria, and pertussis (Tdap) booster vaccine.  Influenza every year before the flu season begins.  Pneumonia vaccine.  Shingles vaccine.  Your health care provider may also recommend other immunizations. This information is not intended to replace advice given to you by your health care provider. Make sure you discuss any questions you have with your health care provider. Document Released: 05/13/2005 Document Revised: 10/09/2015 Document Reviewed: 12/23/2014 Elsevier Interactive Patient Education  2018 [employer].

## 2016-11-02 NOTE — Assessment & Plan Note (Signed)
Rechecking urine today. Await results. Call with any concerns.  

## 2016-11-03 ENCOUNTER — Other Ambulatory Visit: Payer: Self-pay | Admitting: Family Medicine

## 2016-11-03 LAB — COMPREHENSIVE METABOLIC PANEL
A/G RATIO: 1.7 (ref 1.2–2.2)
ALT: 38 IU/L — ABNORMAL HIGH (ref 0–32)
AST: 29 IU/L (ref 0–40)
Albumin: 4.5 g/dL (ref 3.5–5.5)
Alkaline Phosphatase: 73 IU/L (ref 39–117)
BUN/Creatinine Ratio: 17 (ref 9–23)
BUN: 16 mg/dL (ref 6–24)
Bilirubin Total: 1 mg/dL (ref 0.0–1.2)
CALCIUM: 9.2 mg/dL (ref 8.7–10.2)
CO2: 23 mmol/L (ref 20–29)
Chloride: 102 mmol/L (ref 96–106)
Creatinine, Ser: 0.92 mg/dL (ref 0.57–1.00)
GFR, EST AFRICAN AMERICAN: 79 mL/min/{1.73_m2} (ref >59)
GFR, EST NON AFRICAN AMERICAN: 69 mL/min/{1.73_m2} (ref >59)
GLOBULIN, TOTAL: 2.6 g/dL (ref 1.5–4.5)
Glucose: 91 mg/dL (ref 65–99)
POTASSIUM: 4.2 mmol/L (ref 3.5–5.2)
SODIUM: 139 mmol/L (ref 134–144)
TOTAL PROTEIN: 7.1 g/dL (ref 6.0–8.5)

## 2016-11-03 LAB — CBC WITH DIFFERENTIAL/PLATELET
BASOS: 0 %
Basophils Absolute: 0 10*3/uL (ref 0.0–0.2)
EOS (ABSOLUTE): 0.1 10*3/uL (ref 0.0–0.4)
EOS: 2 %
HEMATOCRIT: 38.3 % (ref 34.0–46.6)
Hemoglobin: 12.6 g/dL (ref 11.1–15.9)
IMMATURE GRANULOCYTES: 0 %
Immature Grans (Abs): 0 10*3/uL (ref 0.0–0.1)
LYMPHS: 46 %
Lymphocytes Absolute: 3.1 10*3/uL (ref 0.7–3.1)
MCH: 29.1 pg (ref 26.6–33.0)
MCHC: 32.9 g/dL (ref 31.5–35.7)
MCV: 89 fL (ref 79–97)
MONOCYTES: 10 %
MONOS ABS: 0.7 10*3/uL (ref 0.1–0.9)
NEUTROS ABS: 2.9 10*3/uL (ref 1.4–7.0)
Neutrophils: 42 %
Platelets: 260 10*3/uL (ref 150–379)
RBC: 4.33 x10E6/uL (ref 3.77–5.28)
RDW: 13 % (ref 12.3–15.4)
WBC: 6.8 10*3/uL (ref 3.4–10.8)

## 2016-11-03 LAB — LIPID PANEL W/O CHOL/HDL RATIO
Cholesterol, Total: 226 mg/dL — ABNORMAL HIGH (ref 100–199)
HDL: 53 mg/dL (ref >39)
LDL Calculated: 160 mg/dL — ABNORMAL HIGH (ref 0–99)
Triglycerides: 64 mg/dL (ref 0–149)
VLDL Cholesterol Cal: 13 mg/dL (ref 5–40)

## 2016-11-03 LAB — TSH: TSH: 1.15 u[IU]/mL (ref 0.450–4.500)

## 2016-11-03 MED ORDER — THYROID 60 MG PO TABS: ORAL_TABLET | ORAL | 6 refills | Status: DC

## 2016-11-04 ENCOUNTER — Other Ambulatory Visit: Payer: Self-pay | Admitting: Family Medicine

## 2016-11-04 LAB — MICROALBUMIN, URINE WAIVED
Creatinine, Urine Waived: 300 mg/dL (ref 10–300)
MICROALB, UR WAIVED: 80 mg/L — AB (ref 0–19)

## 2016-11-04 LAB — BAYER DCA HB A1C WAIVED: HB A1C (BAYER DCA - WAIVED): 5.6 % (ref <7.0)

## 2016-11-04 LAB — UA/M W/RFLX CULTURE, ROUTINE
BILIRUBIN UA: NEGATIVE
Glucose, UA: NEGATIVE
Leukocytes, UA: NEGATIVE
NITRITE UA: NEGATIVE
Specific Gravity, UA: >1.03 — ABNORMAL HIGH (ref 1.005–1.030)
UUROB: 0.2 mg/dL (ref 0.2–1.0)
pH, UA: 5.5 (ref 5.0–7.5)

## 2016-11-04 LAB — URINE CULTURE, REFLEX

## 2016-11-04 LAB — MICROSCOPIC EXAMINATION: RBC MICROSCOPIC, UA: NONE SEEN /HPF (ref 0–?)

## 2016-11-04 MED ORDER — CIPROFLOXACIN HCL 500 MG PO TABS: 500.0000 mg | ORAL_TABLET | Freq: Two times a day (BID) | ORAL | 0 refills | Status: DC

## 2016-11-07 ENCOUNTER — Telehealth: Payer: Self-pay | Admitting: Family Medicine

## 2016-11-07 ENCOUNTER — Encounter: Payer: Self-pay | Admitting: Family Medicine

## 2016-11-07 ENCOUNTER — Ambulatory Visit (INDEPENDENT_AMBULATORY_CARE_PROVIDER_SITE_OTHER): Payer: BC Managed Care – PPO | Admitting: Family Medicine

## 2016-11-07 VITALS — BP 136/80 | HR 65 | Wt 204.0 lb

## 2016-11-07 DIAGNOSIS — R079 Chest pain, unspecified: Secondary | ICD-10-CM

## 2016-11-07 NOTE — Telephone Encounter (Signed)
Called patient and scheduled an appointment with Apolonio Schneiders for today, 11/07/2016 at 3:45 pm.  Please Advise.  Thank you

## 2016-11-07 NOTE — Telephone Encounter (Signed)
I would really like patient to be seen so we can make sure everything is OK- can she see Apolonio Schneiders this PM?

## 2016-11-07 NOTE — Telephone Encounter (Signed)
Routing to provider  

## 2016-11-07 NOTE — Telephone Encounter (Signed)
Tracie Sheppard.

## 2016-11-07 NOTE — Progress Notes (Signed)
   BP 136/80   Pulse 65   Wt 204 lb (92.5 kg)   LMP  (LMP Unknown)   SpO2 99%   BMI 33.74 kg/m    Subjective:    Patient ID: Tracie Sheppard, female    DOB: Aug 13, 1955, 61 y.o.   MRN: [id number]  HPI: Tracie Sheppard is a 61 y.o. female  Chief Complaint  Patient presents with  . Medication Reaction    Took first dose of Wellbutrin today.    Patient presents with right sided chest pain, right arm pain,and headache episode that lasted about an hour earlier today. Started about 30-60 min after taking her first dose of wellbutrin. Notes that she also didn't eat much of anything today and wonders if taking all of her medicines on an empty stomach caused this. Denies SOB, palpitations, fever, N/V. Sxs have almost fully resolved at this time. States this happened once in the past after she took an aleve with no food so has noted that she can't take that medication any longer.   Relevant past medical, surgical, family and social history reviewed and updated as indicated. Interim medical history since our last visit reviewed. Allergies and medications reviewed and updated.  Review of Systems  Constitutional: Negative.   HENT: Negative.   Respiratory: Negative.   Cardiovascular: Positive for chest pain.  Gastrointestinal: Negative.   Genitourinary: Negative.   Musculoskeletal: Positive for arthralgias.  Skin: Negative.   Neurological: Positive for headaches.  Psychiatric/Behavioral: Negative.     Per HPI unless specifically indicated above     Objective:    BP 136/80   Pulse 65   Wt 204 lb (92.5 kg)   LMP  (LMP Unknown)   SpO2 99%   BMI 33.74 kg/m   Wt Readings from Last 3 Encounters:  11/07/16 204 lb (92.5 kg)  11/02/16 203 lb 2 oz (92.1 kg)  10/11/16 204 lb 4 oz (92.6 kg)    Physical Exam  Constitutional: She is oriented to person, place, and time. She appears well-developed and well-nourished. No distress.  HENT:  Head: Atraumatic.  Mouth/Throat: Oropharynx is clear  and moist.  Eyes: Pupils are equal, round, and reactive to light. Conjunctivae are normal. No scleral icterus.  Neck: Normal range of motion. Neck supple.  Cardiovascular: Normal rate, regular rhythm, normal heart sounds and intact distal pulses.   Pulmonary/Chest: Effort normal and breath sounds normal. No respiratory distress.  Musculoskeletal: Normal range of motion.  Neurological: She is alert and oriented to person, place, and time.  Skin: Skin is warm and dry.  Psychiatric: She has a normal mood and affect. Her behavior is normal.  Nursing note and vitals reviewed.     Assessment & Plan:   Problem List Items Addressed This Visit    None    Visit Diagnoses    Chest pain, unspecified type    -  Primary   Relevant Orders   EKG 12-Lead (Completed)    Given timeline and hx of similar reactions to medications, suspect sxs related to Wellbutrin. EKG today showing NSR at 76 bpm with no suspicious ST elevation or T wave changes. Discussed that we cannot r/o cardiac etiology without cardiac enzymes and that if sxs recur she should proceed to the ER for immediate evaluation.Pt agreeable to plan and will d/c Wellbutrin and monitor closely for recurrence.    Follow up plan: Return if symptoms worsen or fail to improve.

## 2016-11-07 NOTE — Telephone Encounter (Signed)
Patient has been having dull pains in her right chest, head, and shoulder. Patient is unsure if these are side affects from the medication. Patient had sharp pains at firs tabout an hour after taking medication but pains have become dull. Asked patient is she has had any sharp or dull pains in arm, she denied and stated just her shoulder. Patient states that she doesn't think the side affects would start so soon but is unsure of what she should do. Patient would like to speak with provider before she does anything.  Please Advise.  Thank you

## 2016-11-09 NOTE — Patient Instructions (Signed)
Follow up as needed

## 2016-11-23 ENCOUNTER — Encounter: Payer: Self-pay | Admitting: Family Medicine

## 2016-11-23 ENCOUNTER — Ambulatory Visit (INDEPENDENT_AMBULATORY_CARE_PROVIDER_SITE_OTHER): Payer: BC Managed Care – PPO | Admitting: Family Medicine

## 2016-11-23 VITALS — BP 130/77 | HR 82 | Temp 98.3°F | Wt 206.1 lb

## 2016-11-23 DIAGNOSIS — R5383 Other fatigue: Secondary | ICD-10-CM

## 2016-11-23 DIAGNOSIS — R300 Dysuria: Secondary | ICD-10-CM | POA: Diagnosis not present

## 2016-11-23 DIAGNOSIS — R0683 Snoring: Secondary | ICD-10-CM | POA: Diagnosis not present

## 2016-11-23 DIAGNOSIS — L299 Pruritus, unspecified: Secondary | ICD-10-CM

## 2016-11-23 LAB — MICROSCOPIC EXAMINATION: Bacteria, UA: NONE SEEN

## 2016-11-23 LAB — UA/M W/RFLX CULTURE, ROUTINE
Bilirubin, UA: NEGATIVE
Glucose, UA: NEGATIVE
KETONES UA: NEGATIVE
Leukocytes, UA: NEGATIVE
NITRITE UA: NEGATIVE
Protein, UA: NEGATIVE
RBC UA: NEGATIVE
SPEC GRAV UA: 1.02 (ref 1.005–1.030)
UUROB: 0.2 mg/dL (ref 0.2–1.0)
pH, UA: 7 (ref 5.0–7.5)

## 2016-11-23 NOTE — Patient Instructions (Addendum)
Atrophic Vaginitis Atrophic vaginitis is a condition in which the tissues that line the vagina become dry and thin. This condition is most common in women who have stopped having regular menstrual periods (menopause). This usually starts when a woman is 45-59 years old. Estrogen helps to keep the vagina moist. It stimulates the vagina to produce a clear fluid that lubricates the vagina for sexual intercourse. This fluid also protects the vagina from infection. Lack of estrogen can cause the lining of the vagina to get thinner and dryer. The vagina may also shrink in size. It may become less elastic. Atrophic vaginitis tends to get worse over time as a woman's estrogen level drops. What are the causes? This condition is caused by the normal drop in estrogen that happens around the time of menopause. What increases the risk? Certain conditions or situations may lower a woman's estrogen level, which increases her risk of atrophic vaginitis. These include:  Taking medicine that blocks estrogen.  Having ovaries removed surgically.  Being treated for cancer with X-ray treatment (radiation) or medicines (chemotherapy).  Exercising very hard and often.  Having an eating disorder (anorexia).  Giving birth or breastfeeding.  Being over the age of 50.  Smoking.  What are the signs or symptoms? Symptoms of this condition include:  Pain, soreness, or bleeding during sexual intercourse (dyspareunia).  Vaginal burning, irritation, or itching.  Pain or bleeding during a vaginal examination using a speculum (pelvic exam).  Loss of interest in sexual activity.  Having burning pain when passing urine.  Vaginal discharge that is brown or yellow.  In some cases, there are no symptoms. How is this diagnosed? This condition is diagnosed with a medical history and physical exam. This will include a pelvic exam that checks whether the inside of your vagina appears pale, thin, or dry. Rarely, you may  also have other tests, including:  A urine test.  A test that checks the acid balance in your vaginal fluid (acid balance test).  How is this treated? Treatment for this condition may depend on the severity of your symptoms. Treatment may include:  Using an over-the-counter vaginal lubricant before you have sexual intercourse.  Using a long-acting vaginal moisturizer.  Using low-dose vaginal estrogen for moderate to severe symptoms that do not respond to other treatments. Options include creams, tablets, and inserts (vaginal rings). Before using vaginal estrogen, tell your health care provider if you have a history of: ? Breast cancer. ? Endometrial cancer. ? Blood clots.  Taking medicines. You may be able to take a daily pill for dyspareunia. Discuss all of the risks of this medicine with your health care provider. It is usually not recommended for women who have a family history or personal history of breast cancer.  If your symptoms are very mild and you are not sexually active, you may not need treatment. Follow these instructions at home:  Take medicines only as directed by your health care provider. Do not use herbal or alternative medicines unless your health care provider says that you can.  Use over-the-counter creams, lubricants, or moisturizers for dryness only as directed by your health care provider.  If your atrophic vaginitis is caused by menopause, discuss all of your menopausal symptoms and treatment options with your health care provider.  Do not douche.  Do not use products that can make your vagina dry. These include: ? Scented feminine sprays. ? Scented tampons. ? Scented soaps.  If it hurts to have sex, talk with your sexual   partner. Contact a health care provider if:  Your discharge looks different than normal.  Your vagina has an unusual smell.  You have new symptoms.  Your symptoms do not improve with treatment.  Your symptoms get worse. This  information is not intended to replace advice given to you by your health care provider. Make sure you discuss any questions you have with your health care provider. Document Released: 08/05/2014 Document Revised: 08/27/2015 Document Reviewed: 03/12/2014 Elsevier Interactive Patient Education  2018 [employer]  

## 2016-11-23 NOTE — Progress Notes (Signed)
BP 130/77 (BP Location: Right Arm, Patient Position: Sitting, Cuff Size: Large)   Pulse 82   Temp 98.3 F (36.8 C)   Wt 206 lb 2 oz (93.5 kg)   LMP  (LMP Unknown)   SpO2 98%   BMI 34.09 kg/m    Subjective:    Patient ID: Tracie Sheppard, female    DOB: April 25, 1955, 61 y.o.   MRN: [id number]  HPI: Tracie Sheppard is a 61 y.o. female  Chief Complaint  Patient presents with  . Urinary Tract Infection   Tracie Sheppard notes that she has been extremely tired over the last couple of weeks. She notes that she has been falling asleep when she is sitting and waiting for things. She does snore. She often wakes up with headaches. She has never been told that she stops breathing when she's sleeping. She would be interested in a sleep study.  PELVIC PAIN- having a constant burning sensation near her urethra, having cramps in her lower belly. Doesn't have any frequency,  Duration: days Onset: gradual Severity: mild Quality: burning Frequency: constant Previous evaluation/studies: no Treatments attempted: none Fevers: no Nausea/vomiting: no Vaginal discharge: no Dysmenorrhea: no Dyspareunia: no Weight loss: no Dysuria/urinary frequency: no Hematuria: no Sexual activity: yes Contraception: N/A History STDs: no History GYN procedures: no Previous pap smear: yes  [name] also notes that she has been itching all over. She is not noticing any rashes. She notes that she has been using vasaline as a moisturizer and has had labs checked recently that were normal. This has been going on for several weeks to a couple of months and she is very frustrated. She would like to see dermatology.  Relevant past medical, surgical, family and social history reviewed and updated as indicated. Interim medical history since our last visit reviewed. Allergies and medications reviewed and updated.  Review of Systems  Constitutional: Positive for fatigue. Negative for activity change, appetite change, chills,  diaphoresis, fever and unexpected weight change.  Respiratory: Negative.   Cardiovascular: Negative.   Skin: Negative for color change, pallor, rash and wound.       Itching  Psychiatric/Behavioral: Negative.     Per HPI unless specifically indicated above     Objective:    BP 130/77 (BP Location: Right Arm, Patient Position: Sitting, Cuff Size: Large)   Pulse 82   Temp 98.3 F (36.8 C)   Wt 206 lb 2 oz (93.5 kg)   LMP  (LMP Unknown)   SpO2 98%   BMI 34.09 kg/m   Wt Readings from Last 3 Encounters:  11/23/16 206 lb 2 oz (93.5 kg)  11/07/16 204 lb (92.5 kg)  11/02/16 203 lb 2 oz (92.1 kg)    Physical Exam  Constitutional: She is oriented to person, place, and time. She appears well-developed and well-nourished. No distress.  HENT:  Head: Normocephalic and atraumatic.  Right Ear: Hearing normal.  Left Ear: Hearing normal.  Nose: Nose normal.  Eyes: Conjunctivae and lids are normal. Right eye exhibits no discharge. Left eye exhibits no discharge. No scleral icterus.  Cardiovascular: Normal rate, regular rhythm, normal heart sounds and intact distal pulses.  Exam reveals no gallop and no friction rub.   No murmur heard. Pulmonary/Chest: Effort normal and breath sounds normal. No respiratory distress. She has no wheezes. She has no rales. She exhibits no tenderness.  Musculoskeletal: Normal range of motion.  Neurological: She is alert and oriented to person, place, and time.  Skin: Skin is warm, dry and  intact. No rash noted. She is not diaphoretic. No erythema. No pallor.  No rash, some dry skin  Psychiatric: She has a normal mood and affect. Her speech is normal and behavior is normal. Judgment and thought content normal. Cognition and memory are normal.  Nursing note and vitals reviewed.   Results for orders placed or performed in visit on 11/23/16  Microscopic Examination  Result Value Ref Range   WBC, UA 0-5 0 - 5 /hpf   RBC, UA 0-2 0 - 2 /hpf   Epithelial Cells  (non renal) 0-10 0 - 10 /hpf   Bacteria, UA None seen None seen/Few  UA/M w/rflx Culture, Routine  Result Value Ref Range   Specific Gravity, UA 1.020 1.005 - 1.030   pH, UA 7.0 5.0 - 7.5   Color, UA Yellow Yellow   Appearance Ur Clear Clear   Leukocytes, UA Negative Negative   Protein, UA Negative Negative/Trace   Glucose, UA Negative Negative   Ketones, UA Negative Negative   RBC, UA Negative Negative   Bilirubin, UA Negative Negative   Urobilinogen, Ur 0.2 0.2 - 1.0 mg/dL   Nitrite, UA Negative Negative   Microscopic Examination See below:       Assessment & Plan:   Problem List Items Addressed This Visit    None    Visit Diagnoses    Dysuria    -  Primary   UA negative. Likely due to irritation. Gentle vaginal care discussed today. Will try no soap for 2 weeks, then will follow up. If no better, will perform pelvic   Relevant Orders   UA/M w/rflx Culture, Routine (Completed)   Snoring       Referral to sleep study made today. Await results.    Relevant Orders   Ambulatory referral to Sleep Studies   Fatigue, unspecified type       ?[facility]- referral generated today for sleep study. Await results.    Relevant Orders   Ambulatory referral to Sleep Studies   Itching       No rash. Labs normal last check. Will refer to dermatology for evaluation. Await appointment.    Relevant Orders   Ambulatory referral to Dermatology       Follow up plan: Return in about 4 weeks (around 12/21/2016).

## 2016-11-29 ENCOUNTER — Telehealth: Payer: Self-pay | Admitting: Family Medicine

## 2016-11-29 MED ORDER — CIPROFLOXACIN HCL 500 MG PO TABS: 500.0000 mg | ORAL_TABLET | Freq: Two times a day (BID) | ORAL | 0 refills | Status: DC

## 2016-11-29 NOTE — Telephone Encounter (Signed)
Per [facility] Call,   "Caller stated was seen earlier this month for an UTI and was put on an antibiotic for 3 days. Was still having discomfort so went back on Wednesday and was advised her urine was clear. Stated is having pain in lower back on right side and frequent urination. She does have blood in her urine."  Please advise.

## 2016-11-29 NOTE — Telephone Encounter (Signed)
Patient spoke with on call nurse last night regarding her UTI symptoms with blood.  Pain was unbearable last night with pain in right side and back.  She said she would like a call from Dr Wynetta Emery  Thank you

## 2016-11-29 NOTE — Telephone Encounter (Signed)
Please let Tracie Sheppard know that it might be that we just haven't treated her long enough. I've sent a week's supply of medicine to her pharmacy and if she's not doing better after that we'll recheck her urine. Thanks!

## 2016-11-29 NOTE — Telephone Encounter (Signed)
Patient notified that an antibiotic was sent to the pharmacy.

## 2016-12-06 ENCOUNTER — Encounter: Payer: Self-pay | Admitting: Family Medicine

## 2016-12-06 ENCOUNTER — Ambulatory Visit (INDEPENDENT_AMBULATORY_CARE_PROVIDER_SITE_OTHER): Payer: BC Managed Care – PPO | Admitting: Family Medicine

## 2016-12-06 VITALS — BP 121/80 | HR 73 | Temp 97.8°F | Wt 205.4 lb

## 2016-12-06 DIAGNOSIS — F331 Major depressive disorder, recurrent, moderate: Secondary | ICD-10-CM | POA: Diagnosis not present

## 2016-12-06 MED ORDER — CITALOPRAM HYDROBROMIDE 40 MG PO TABS
60.0000 mg | ORAL_TABLET | Freq: Every day | ORAL | 1 refills | Status: DC
Start: 2016-12-06 — End: 2017-04-25

## 2016-12-06 NOTE — Assessment & Plan Note (Signed)
Will restart wellbutrin, if chest pain comes back, stop immediately and will consider starting abilify. Will increase celexa to 60mg  daily. Call with any concerns.

## 2016-12-06 NOTE — Progress Notes (Signed)
BP 121/80 (BP Location: Left Arm, Patient Position: Sitting, Cuff Size: Normal)   Pulse 73   Temp 97.8 F (36.6 C)   Wt 205 lb 6 oz (93.2 kg)   LMP  (LMP Unknown)   SpO2 98%   BMI 33.97 kg/m    Subjective:    Patient ID: Tracie Sheppard, female    DOB: April 24, 1955, 61 y.o.   MRN: [id number]  HPI: Tracie Sheppard is a 61 y.o. female  Chief Complaint  Patient presents with  . Anxiety   DEPRESSION/ANXIETY- had to stop the wellbutrin due to chest pain.  Mood status: uncontrolled Satisfied with current treatment?: no Symptom severity: moderate  Duration of current treatment : chronic Side effects: no Medication compliance: good compliance Psychotherapy/counseling: no  Previous psychiatric medications: celexa, wellbutrin Depressed mood: yes Anxious mood: yes Anhedonia: no Significant weight loss or gain: no Insomnia: yes hard to fall asleep Fatigue: yes Feelings of worthlessness or guilt: yes Impaired concentration/indecisiveness: no Suicidal ideations: no Hopelessness: no Crying spells: no Depression screen [facility] 2/9 12/06/2016 11/02/2016  Decreased Interest 1 1  Down, Depressed, Hopeless 1 0  PHQ - 2 Score 2 1  Altered sleeping 3 3  Tired, decreased energy 3 3  Change in appetite 3 3  Feeling bad or failure about yourself  1 -  Trouble concentrating 0 0  Moving slowly or fidgety/restless 0 0  Suicidal thoughts 0 0  PHQ-9 Score 12 10  Difficult doing work/chores Somewhat difficult -   GAD 7 : Generalized Anxiety Score 12/06/2016  Nervous, Anxious, on Edge 1  Control/stop worrying 0  Worry too much - different things 0  Trouble relaxing 1  Restless 0  Easily annoyed or irritable 2  Afraid - awful might happen 0  Total GAD 7 Score 4    Relevant past medical, surgical, family and social history reviewed and updated as indicated. Interim medical history since our last visit reviewed. Allergies and medications reviewed and updated.  Review of Systems    Constitutional: Negative.   Respiratory: Negative.   Cardiovascular: Negative.   Psychiatric/Behavioral: Positive for dysphoric mood. Negative for agitation, behavioral problems, confusion, decreased concentration, hallucinations, self-injury, sleep disturbance and suicidal ideas. The patient is nervous/anxious. The patient is not hyperactive.     Per HPI unless specifically indicated above     Objective:    BP 121/80 (BP Location: Left Arm, Patient Position: Sitting, Cuff Size: Normal)   Pulse 73   Temp 97.8 F (36.6 C)   Wt 205 lb 6 oz (93.2 kg)   LMP  (LMP Unknown)   SpO2 98%   BMI 33.97 kg/m   Wt Readings from Last 3 Encounters:  12/06/16 205 lb 6 oz (93.2 kg)  11/23/16 206 lb 2 oz (93.5 kg)  11/07/16 204 lb (92.5 kg)    Physical Exam  Constitutional: She is oriented to person, place, and time. She appears well-developed and well-nourished. No distress.  HENT:  Head: Normocephalic and atraumatic.  Right Ear: Hearing normal.  Left Ear: Hearing normal.  Nose: Nose normal.  Eyes: Conjunctivae and lids are normal. Right eye exhibits no discharge. Left eye exhibits no discharge. No scleral icterus.  Cardiovascular: Normal rate, regular rhythm, normal heart sounds and intact distal pulses.  Exam reveals no gallop and no friction rub.   No murmur heard. Pulmonary/Chest: Effort normal and breath sounds normal. No respiratory distress. She has no wheezes. She has no rales. She exhibits no tenderness.  Musculoskeletal: Normal range of  motion.  Neurological: She is alert and oriented to person, place, and time.  Skin: Skin is warm, dry and intact. No rash noted. No erythema. No pallor.  Psychiatric: Her speech is normal and behavior is normal. Judgment and thought content normal. Cognition and memory are normal. She exhibits a depressed mood.  Nursing note and vitals reviewed.   Results for orders placed or performed in visit on 11/23/16  Microscopic Examination  Result Value  Ref Range   WBC, UA 0-5 0 - 5 /hpf   RBC, UA 0-2 0 - 2 /hpf   Epithelial Cells (non renal) 0-10 0 - 10 /hpf   Bacteria, UA None seen None seen/Few  UA/M w/rflx Culture, Routine  Result Value Ref Range   Specific Gravity, UA 1.020 1.005 - 1.030   pH, UA 7.0 5.0 - 7.5   Color, UA Yellow Yellow   Appearance Ur Clear Clear   Leukocytes, UA Negative Negative   Protein, UA Negative Negative/Trace   Glucose, UA Negative Negative   Ketones, UA Negative Negative   RBC, UA Negative Negative   Bilirubin, UA Negative Negative   Urobilinogen, Ur 0.2 0.2 - 1.0 mg/dL   Nitrite, UA Negative Negative   Microscopic Examination See below:       Assessment & Plan:   Problem List Items Addressed This Visit      Other   Depression, major, recurrent, moderate ([place]) - Primary    Will restart wellbutrin, if chest pain comes back, stop immediately and will consider starting abilify. Will increase celexa to 60mg  daily. Call with any concerns.       Relevant Medications   citalopram (CELEXA) 40 MG tablet       Follow up plan: Return in about 4 weeks (around 01/03/2017) for Follow up mood.

## 2016-12-29 ENCOUNTER — Encounter: Payer: Self-pay | Admitting: Family Medicine

## 2016-12-29 ENCOUNTER — Ambulatory Visit (INDEPENDENT_AMBULATORY_CARE_PROVIDER_SITE_OTHER): Payer: BC Managed Care – PPO | Admitting: Family Medicine

## 2016-12-29 VITALS — BP 129/84 | HR 88 | Temp 98.1°F | Wt 209.4 lb

## 2016-12-29 DIAGNOSIS — M9905 Segmental and somatic dysfunction of pelvic region: Secondary | ICD-10-CM | POA: Diagnosis not present

## 2016-12-29 DIAGNOSIS — M9901 Segmental and somatic dysfunction of cervical region: Secondary | ICD-10-CM | POA: Diagnosis not present

## 2016-12-29 DIAGNOSIS — M9908 Segmental and somatic dysfunction of rib cage: Secondary | ICD-10-CM | POA: Diagnosis not present

## 2016-12-29 DIAGNOSIS — M9902 Segmental and somatic dysfunction of thoracic region: Secondary | ICD-10-CM

## 2016-12-29 DIAGNOSIS — M545 Low back pain: Secondary | ICD-10-CM | POA: Diagnosis not present

## 2016-12-29 DIAGNOSIS — G8929 Other chronic pain: Secondary | ICD-10-CM

## 2016-12-29 DIAGNOSIS — M9903 Segmental and somatic dysfunction of lumbar region: Secondary | ICD-10-CM

## 2016-12-29 DIAGNOSIS — M9900 Segmental and somatic dysfunction of head region: Secondary | ICD-10-CM

## 2016-12-29 DIAGNOSIS — M9909 Segmental and somatic dysfunction of abdomen and other regions: Secondary | ICD-10-CM | POA: Diagnosis not present

## 2016-12-29 DIAGNOSIS — M25541 Pain in joints of right hand: Secondary | ICD-10-CM

## 2016-12-29 DIAGNOSIS — M79644 Pain in right finger(s): Secondary | ICD-10-CM

## 2016-12-29 DIAGNOSIS — M9904 Segmental and somatic dysfunction of sacral region: Secondary | ICD-10-CM

## 2016-12-29 NOTE — Progress Notes (Signed)
BP 129/84 (BP Location: Left Arm, Patient Position: Sitting, Cuff Size: Large)   Pulse 88   Temp 98.1 F (36.7 C)   Wt 209 lb 7 oz (95 kg)   LMP  (LMP Unknown)   SpO2 99%   BMI 34.64 kg/m    Subjective:    Patient ID: Tracie Sheppard, female    DOB: 1955-07-15, 61 y.o.   MRN: [id number]  HPI: Tracie Sheppard is a 61 y.o. female  Chief Complaint  Patient presents with  . Back Pain   Tracie Sheppard presents today for evaluation and possible treatment of her low back pain. She went to a concert for her birthday and was standing in the rain outside for about 5-6 hours. She notes between that and several hours flying in small seats, her back has been acting up a lot. She notes that it's her low back as well as her upper back. It's aching and tight in nature. Better with OMT and medicine. Worse with certain positions, sitting, or standing too long. She finds OMT very helpful and did well following her last appointment. Pain radiates into her shoulders and into her legs. She is otherwise feeling pretty good. She does note that her R MCP has been hurting a lot. Has had x-rays done at rheumatology before, but not recently. Seems to be bugging her more recently. No other concerns or complaints at this time.   Relevant past medical, surgical, family and social history reviewed and updated as indicated. Interim medical history since our last visit reviewed. Allergies and medications reviewed and updated.  Review of Systems  Constitutional: Negative.   Respiratory: Negative.   Cardiovascular: Negative.   Musculoskeletal: Positive for back pain, gait problem and myalgias. Negative for arthralgias, joint swelling, neck pain and neck stiffness.  Skin: Negative.   Neurological: Negative for dizziness, tremors, seizures, syncope, facial asymmetry, speech difficulty, weakness, light-headedness, numbness and headaches.  Psychiatric/Behavioral: Negative.     Per HPI unless specifically indicated above       Objective:    BP 129/84 (BP Location: Left Arm, Patient Position: Sitting, Cuff Size: Large)   Pulse 88   Temp 98.1 F (36.7 C)   Wt 209 lb 7 oz (95 kg)   LMP  (LMP Unknown)   SpO2 99%   BMI 34.64 kg/m   Wt Readings from Last 3 Encounters:  12/29/16 209 lb 7 oz (95 kg)  12/06/16 205 lb 6 oz (93.2 kg)  11/23/16 206 lb 2 oz (93.5 kg)    Physical Exam  Constitutional: She is oriented to person, place, and time. She appears well-developed and well-nourished. No distress.  HENT:  Head: Normocephalic and atraumatic.  Right Ear: Hearing normal.  Left Ear: Hearing normal.  Nose: Nose normal.  Eyes: Conjunctivae and lids are normal. Right eye exhibits no discharge. Left eye exhibits no discharge. No scleral icterus.  Pulmonary/Chest: Effort normal. No respiratory distress.  Abdominal: Soft. She exhibits no distension and no mass. There is no tenderness. There is no rebound and no guarding.  Neurological: She is alert and oriented to person, place, and time.  Skin: Skin is warm, dry and intact. No rash noted. She is not diaphoretic. No erythema. No pallor.  Psychiatric: She has a normal mood and affect. Her speech is normal and behavior is normal. Judgment and thought content normal. Cognition and memory are normal.  Nursing note and vitals reviewed. Musculoskeletal:  Exam found Decreased ROM, Tissue texture changes, Tenderness to palpation and Asymmetry  of patient's  head, neck, thorax, ribs, lumbar, pelvis, sacrum and abdomen Osteopathic Structural Exam:   Head: hypertonic suboccipital muscles, OAESSR  Neck: SCM hypertonic bilaterally R>L, trap spasm bilaterally, C3ESRR  Thorax: T4-6SLRR, trap spasms bilaterally, periscapular hypertonicity bilaterally  Ribs: Ribs 5-8 locked up on the L  Lumbar: psoas spasm on the R, QL spasm bilaterally, L3-4SLRR  Pelvis: Posterior L innominate  Sacrum: L on R torsion  Abdomen: diaphragm spasm bilaterally   Results for orders placed or performed  in visit on 11/23/16  Microscopic Examination  Result Value Ref Range   WBC, UA 0-5 0 - 5 /hpf   RBC, UA 0-2 0 - 2 /hpf   Epithelial Cells (non renal) 0-10 0 - 10 /hpf   Bacteria, UA None seen None seen/Few  UA/M w/rflx Culture, Routine  Result Value Ref Range   Specific Gravity, UA 1.020 1.005 - 1.030   pH, UA 7.0 5.0 - 7.5   Color, UA Yellow Yellow   Appearance Ur Clear Clear   Leukocytes, UA Negative Negative   Protein, UA Negative Negative/Trace   Glucose, UA Negative Negative   Ketones, UA Negative Negative   RBC, UA Negative Negative   Bilirubin, UA Negative Negative   Urobilinogen, Ur 0.2 0.2 - 1.0 mg/dL   Nitrite, UA Negative Negative   Microscopic Examination See below:       Assessment & Plan:   Problem List Items Addressed This Visit      Other   Low back pain - Primary    In acute exacerbation due to standing for several hours at a music festival. She does have some somatic dysfunction that I thing would benefit from OMT. Treated today with good results as below. Call with any concerns.        Other Visit Diagnoses    Lumbar region somatic dysfunction       Sacral region somatic dysfunction       Somatic dysfunction of pelvis region       Thoracic segment dysfunction       Somatic dysfunction of abdominal region       Rib cage region somatic dysfunction       Cervical somatic dysfunction       Head region somatic dysfunction       Pain in thumb joint with movement of right hand       Will obtain x-ray. Ordered today.   Relevant Orders   DG Hand Complete Right    After verbal consent was obtained, patient was treated today with osteopathic manipulative medicine to the regions of the head, neck, thorax, ribs, lumbar, pelvis, sacrum and abdomen using the techniques of myofascial release, counterstrain, muscle energy, HVLA and soft tissue. Areas of compensation relating to her primary pain source also treated. Patient tolerated the procedure well with good  objective and good subjective improvement in symptoms. She left the room in good condition. She was advised to stay well hydrated and that she may have some soreness following the procedure. If not improving or worsening, she will call and come in. She will return for reevaluation  on a PRN basis.    Follow up plan: Return if symptoms worsen or fail to improve.

## 2016-12-29 NOTE — Assessment & Plan Note (Signed)
In acute exacerbation due to standing for several hours at a music festival. She does have some somatic dysfunction that I thing would benefit from OMT. Treated today with good results as below. Call with any concerns.

## 2017-01-03 ENCOUNTER — Encounter: Payer: Self-pay | Admitting: Family Medicine

## 2017-01-03 ENCOUNTER — Ambulatory Visit (INDEPENDENT_AMBULATORY_CARE_PROVIDER_SITE_OTHER): Payer: BC Managed Care – PPO | Admitting: Family Medicine

## 2017-01-03 VITALS — BP 126/80 | HR 85 | Temp 98.3°F | Wt 210.5 lb

## 2017-01-03 DIAGNOSIS — F331 Major depressive disorder, recurrent, moderate: Secondary | ICD-10-CM

## 2017-01-03 DIAGNOSIS — R0981 Nasal congestion: Secondary | ICD-10-CM

## 2017-01-03 MED ORDER — BUPROPION HCL ER (SR) 150 MG PO TB12
150.0000 mg | ORAL_TABLET | Freq: Two times a day (BID) | ORAL | 1 refills | Status: DC
Start: 2017-01-03 — End: 2017-01-31

## 2017-01-03 MED ORDER — PREDNISONE 50 MG PO TABS: 50.0000 mg | ORAL_TABLET | Freq: Every day | ORAL | 0 refills | Status: DC

## 2017-01-03 NOTE — Progress Notes (Signed)
BP 126/80 (BP Location: Left Arm, Patient Position: Sitting, Cuff Size: Large)   Pulse 85   Temp 98.3 F (36.8 C)   Wt 210 lb 8 oz (95.5 kg)   LMP  (LMP Unknown)   SpO2 98%   BMI 34.81 kg/m    Subjective:    Patient ID: Tracie Sheppard, female    DOB: 01-12-1956, 61 y.o.   MRN: [id number]  HPI: Tracie Sheppard is a 61 y.o. female  Chief Complaint  Patient presents with  . mood  . URI   DEPRESSION- only in the last week has been taking the change in dose.  Mood status: uncontrolled Satisfied with current treatment?: no Symptom severity: moderate  Duration of current treatment : chronic Side effects: no Medication compliance: fair compliance Psychotherapy/counseling: no  Previous psychiatric medications: celexa, wellbutrin Depressed mood: yes Anxious mood: yes Anhedonia: no Significant weight loss or gain: no Insomnia: yes hard to stay asleep Fatigue: yes Feelings of worthlessness or guilt: yes Impaired concentration/indecisiveness: no Suicidal ideations: no Hopelessness: no Crying spells: no Depression screen [facility] 2/9 01/03/2017 01/03/2017 12/06/2016 11/02/2016  Decreased Interest 0 0 1 1  Down, Depressed, Hopeless 1 1 1  0  PHQ - 2 Score 1 1 2 1   Altered sleeping 3 3 3 3   Tired, decreased energy 2 2 3 3   Change in appetite 3 3 3 3   Feeling bad or failure about yourself  1 1 1  -  Trouble concentrating 1 1 0 0  Moving slowly or fidgety/restless 0 0 0 0  Suicidal thoughts 0 0 0 0  PHQ-9 Score 11 11 12 10   Difficult doing work/chores Somewhat difficult Somewhat difficult Somewhat difficult -   UPPER RESPIRATORY TRACT INFECTION Duration: about a week Worst symptom: congestion Fever: no Cough: yes Shortness of breath: no Wheezing: no Chest pain: no Chest tightness: no Chest congestion: no Nasal congestion: yes Runny nose: no Post nasal drip: yes Sneezing: no Sore throat: yes Swollen glands: no Sinus pressure: yes Headache: no Face pain: no Toothache:  no Ear pain: no  Ear pressure: yes bilateral Eyes red/itching:no Eye drainage/crusting: no  Vomiting: no Rash: no Fatigue: yes Sick contacts: no Strep contacts: no  Context: better Recurrent sinusitis: no Relief with OTC cold/cough medications: yes  Treatments attempted: anti-histamine   Relevant past medical, surgical, family and social history reviewed and updated as indicated. Interim medical history since our last visit reviewed. Allergies and medications reviewed and updated.  Review of Systems  Constitutional: Positive for chills, fatigue and fever. Negative for activity change, appetite change, diaphoresis and unexpected weight change.  HENT: Positive for congestion, postnasal drip and sinus pressure. Negative for dental problem, drooling, ear discharge, ear pain, facial swelling, hearing loss, mouth sores, nosebleeds, rhinorrhea, sinus pain, sneezing, sore throat, tinnitus, trouble swallowing and voice change.   Respiratory: Negative.   Cardiovascular: Negative.   Psychiatric/Behavioral: Positive for dysphoric mood. Negative for agitation, behavioral problems, confusion, decreased concentration, hallucinations, self-injury, sleep disturbance and suicidal ideas. The patient is nervous/anxious. The patient is not hyperactive.     Per HPI unless specifically indicated above     Objective:    BP 126/80 (BP Location: Left Arm, Patient Position: Sitting, Cuff Size: Large)   Pulse 85   Temp 98.3 F (36.8 C)   Wt 210 lb 8 oz (95.5 kg)   LMP  (LMP Unknown)   SpO2 98%   BMI 34.81 kg/m   Wt Readings from Last 3 Encounters:  01/03/17 210 lb  8 oz (95.5 kg)  12/29/16 209 lb 7 oz (95 kg)  12/06/16 205 lb 6 oz (93.2 kg)    Physical Exam  Constitutional: She is oriented to person, place, and time. She appears well-developed and well-nourished. No distress.  HENT:  Head: Normocephalic and atraumatic.  Right Ear: Hearing, tympanic membrane, external ear and ear canal normal.   Left Ear: Hearing, tympanic membrane, external ear and ear canal normal.  Nose: Mucosal edema and rhinorrhea present. Right sinus exhibits no maxillary sinus tenderness and no frontal sinus tenderness. Left sinus exhibits no maxillary sinus tenderness and no frontal sinus tenderness.  Mouth/Throat: Uvula is midline, oropharynx is clear and moist and mucous membranes are normal. No oropharyngeal exudate.  Eyes: Pupils are equal, round, and reactive to light. Conjunctivae, EOM and lids are normal. Right eye exhibits no discharge. Left eye exhibits no discharge. No scleral icterus.  Neck: Normal range of motion. Neck supple. No JVD present. No tracheal deviation present. No thyromegaly present.  Cardiovascular: Normal rate, regular rhythm, normal heart sounds and intact distal pulses.  Exam reveals no gallop and no friction rub.   No murmur heard. Pulmonary/Chest: Effort normal and breath sounds normal. No stridor. No respiratory distress. She has no wheezes. She has no rales. She exhibits no tenderness.  Musculoskeletal: Normal range of motion.  Lymphadenopathy:    She has no cervical adenopathy.  Neurological: She is alert and oriented to person, place, and time.  Skin: Skin is intact. No rash noted. She is not diaphoretic.  Psychiatric: She has a normal mood and affect. Her speech is normal and behavior is normal. Judgment and thought content normal. Cognition and memory are normal.    Results for orders placed or performed in visit on 11/23/16  Microscopic Examination  Result Value Ref Range   WBC, UA 0-5 0 - 5 /hpf   RBC, UA 0-2 0 - 2 /hpf   Epithelial Cells (non renal) 0-10 0 - 10 /hpf   Bacteria, UA None seen None seen/Few  UA/M w/rflx Culture, Routine  Result Value Ref Range   Specific Gravity, UA 1.020 1.005 - 1.030   pH, UA 7.0 5.0 - 7.5   Color, UA Yellow Yellow   Appearance Ur Clear Clear   Leukocytes, UA Negative Negative   Protein, UA Negative Negative/Trace   Glucose, UA  Negative Negative   Ketones, UA Negative Negative   RBC, UA Negative Negative   Bilirubin, UA Negative Negative   Urobilinogen, Ur 0.2 0.2 - 1.0 mg/dL   Nitrite, UA Negative Negative   Microscopic Examination See below:       Assessment & Plan:   Problem List Items Addressed This Visit      Other   Depression, major, recurrent, moderate ([place])    Coming up on the anniversary of her best friend's death. Only started taking her wellbutrin 1 week ago. Will give her a little bit more time on her medicine and recheck in about 3-4 weeks. Encouraged her to reach out to grief counselor and to allow herself to grieve. Call with any concerns.       Relevant Medications   buPROPion (WELLBUTRIN SR) 150 MG 12 hr tablet    Other Visit Diagnoses    Nasal congestion    -  Primary   No sign of infection. Will treat with prednisone. Call with any concerns.       Follow up plan: Return in about 4 weeks (around 01/31/2017) for follow up mood.

## 2017-01-03 NOTE — Assessment & Plan Note (Signed)
Coming up on the anniversary of her best friend's death. Only started taking her wellbutrin 1 week ago. Will give her a little bit more time on her medicine and recheck in about 3-4 weeks. Encouraged her to reach out to grief counselor and to allow herself to grieve. Call with any concerns.

## 2017-01-10 ENCOUNTER — Encounter: Payer: Self-pay | Admitting: Family Medicine

## 2017-01-31 ENCOUNTER — Ambulatory Visit (INDEPENDENT_AMBULATORY_CARE_PROVIDER_SITE_OTHER): Payer: BC Managed Care – PPO | Admitting: Family Medicine

## 2017-01-31 ENCOUNTER — Encounter: Payer: Self-pay | Admitting: Family Medicine

## 2017-01-31 VITALS — BP 137/83 | HR 89 | Temp 99.0°F | Wt 208.1 lb

## 2017-01-31 DIAGNOSIS — F331 Major depressive disorder, recurrent, moderate: Secondary | ICD-10-CM | POA: Diagnosis not present

## 2017-01-31 DIAGNOSIS — Z23 Encounter for immunization: Secondary | ICD-10-CM

## 2017-01-31 MED ORDER — BUPROPION HCL ER (XL) 150 MG PO TB24: 150.0000 mg | ORAL_TABLET | Freq: Every day | ORAL | 1 refills | Status: DC

## 2017-01-31 NOTE — Patient Instructions (Signed)

## 2017-01-31 NOTE — Progress Notes (Signed)
BP 137/83 (BP Location: Left Arm, Patient Position: Sitting, Cuff Size: Large)   Pulse 89   Temp 99 F (37.2 C)   Wt 208 lb 2 oz (94.4 kg)   LMP  (LMP Unknown)   SpO2 99%   BMI 34.42 kg/m    Subjective:    Patient ID: Tracie Sheppard, female    DOB: [birth year], 61 y.o.   MRN: [id number]  HPI: Tracie Sheppard is a 61 y.o. female  Chief Complaint  Patient presents with  . Depression   DEPRESSION Mood status: stable Satisfied with current treatment?: yes Symptom severity: moderate  Duration of current treatment : months Side effects: yes- jittery Medication compliance: excellent compliance Psychotherapy/counseling: no  Previous psychiatric medications: celexa, wellbutrin Depressed mood: yes Anxious mood: yes Anhedonia: no Significant weight loss or gain: no Insomnia: no  Fatigue: yes Feelings of worthlessness or guilt: no Impaired concentration/indecisiveness: no Suicidal ideations: no Hopelessness: no Crying spells: no Depression screen [facility] 2/9 01/31/2017 01/03/2017 01/03/2017 12/06/2016 11/02/2016  Decreased Interest 1 0 0 1 1  Down, Depressed, Hopeless 1 1 1 1  0  PHQ - 2 Score 2 1 1 2 1   Altered sleeping 3 3 3 3 3   Tired, decreased energy 1 2 2 3 3   Change in appetite 3 3 3 3 3   Feeling bad or failure about yourself  1 1 1 1  -  Trouble concentrating 1 1 1  0 0  Moving slowly or fidgety/restless 0 0 0 0 0  Suicidal thoughts 0 0 0 0 0  PHQ-9 Score 11 11 11 12 10   Difficult doing work/chores Somewhat difficult Somewhat difficult Somewhat difficult Somewhat difficult -     Relevant past medical, surgical, family and social history reviewed and updated as indicated. Interim medical history since our last visit reviewed. Allergies and medications reviewed and updated.  Review of Systems  Constitutional: Negative.        Jittery  Respiratory: Negative.   Cardiovascular: Negative.   Neurological: Positive for tremors.  Psychiatric/Behavioral: Positive for dysphoric  mood. Negative for agitation, behavioral problems, confusion, decreased concentration, hallucinations, self-injury, sleep disturbance and suicidal ideas. The patient is nervous/anxious. The patient is not hyperactive.     Per HPI unless specifically indicated above     Objective:    BP 137/83 (BP Location: Left Arm, Patient Position: Sitting, Cuff Size: Large)   Pulse 89   Temp 99 F (37.2 C)   Wt 208 lb 2 oz (94.4 kg)   LMP  (LMP Unknown)   SpO2 99%   BMI 34.42 kg/m   Wt Readings from Last 3 Encounters:  01/31/17 208 lb 2 oz (94.4 kg)  01/03/17 210 lb 8 oz (95.5 kg)  12/29/16 209 lb 7 oz (95 kg)    Physical Exam  Constitutional: She is oriented to person, place, and time. She appears well-developed and well-nourished. No distress.  HENT:  Head: Normocephalic and atraumatic.  Right Ear: Hearing normal.  Left Ear: Hearing normal.  Nose: Nose normal.  Eyes: Conjunctivae and lids are normal. Right eye exhibits no discharge. Left eye exhibits no discharge. No scleral icterus.  Cardiovascular: Normal rate, regular rhythm, normal heart sounds and intact distal pulses.  Exam reveals no gallop and no friction rub.   No murmur heard. Pulmonary/Chest: Effort normal and breath sounds normal. No respiratory distress. She has no wheezes. She has no rales. She exhibits no tenderness.  Musculoskeletal: Normal range of motion.  Neurological: She is alert and oriented to  person, place, and time.  Skin: Skin is warm, dry and intact. No rash noted. She is not diaphoretic. No erythema. No pallor.  Psychiatric: She has a normal mood and affect. Her speech is normal. Judgment and thought content normal. She is agitated. Cognition and memory are normal.  Nursing note and vitals reviewed.   Results for orders placed or performed in visit on 11/23/16  Microscopic Examination  Result Value Ref Range   WBC, UA 0-5 0 - 5 /hpf   RBC, UA 0-2 0 - 2 /hpf   Epithelial Cells (non renal) 0-10 0 - 10 /hpf     Bacteria, UA None seen None seen/Few  UA/M w/rflx Culture, Routine  Result Value Ref Range   Specific Gravity, UA 1.020 1.005 - 1.030   pH, UA 7.0 5.0 - 7.5   Color, UA Yellow Yellow   Appearance Ur Clear Clear   Leukocytes, UA Negative Negative   Protein, UA Negative Negative/Trace   Glucose, UA Negative Negative   Ketones, UA Negative Negative   RBC, UA Negative Negative   Bilirubin, UA Negative Negative   Urobilinogen, Ur 0.2 0.2 - 1.0 mg/dL   Nitrite, UA Negative Negative   Microscopic Examination See below:       Assessment & Plan:   Problem List Items Addressed This Visit      Other   Depression, major, recurrent, moderate ([place]) - Primary    Doing a bit better, but feeling jittery. Will change to XR wellbutrin, and recheck 1 month.       Relevant Medications   buPROPion (WELLBUTRIN XL) 150 MG 24 hr tablet    Other Visit Diagnoses    Immunization due       Flu shot given today.   Relevant Orders   Flu Vaccine QUAD 6+ mos PF IM (Fluarix Quad PF) (Completed)       Follow up plan: Return in about 4 weeks (around 02/28/2017) for follow up mood.

## 2017-01-31 NOTE — Assessment & Plan Note (Signed)
Doing a bit better, but feeling jittery. Will change to XR wellbutrin, and recheck 1 month.

## 2017-02-17 ENCOUNTER — Encounter: Payer: Self-pay | Admitting: Family Medicine

## 2017-02-17 ENCOUNTER — Ambulatory Visit: Payer: BC Managed Care – PPO | Admitting: Family Medicine

## 2017-02-17 VITALS — BP 129/83 | HR 86 | Wt 209.6 lb

## 2017-02-17 DIAGNOSIS — M9903 Segmental and somatic dysfunction of lumbar region: Secondary | ICD-10-CM

## 2017-02-17 DIAGNOSIS — M9908 Segmental and somatic dysfunction of rib cage: Secondary | ICD-10-CM

## 2017-02-17 DIAGNOSIS — M9904 Segmental and somatic dysfunction of sacral region: Secondary | ICD-10-CM

## 2017-02-17 DIAGNOSIS — R0781 Pleurodynia: Secondary | ICD-10-CM

## 2017-02-17 DIAGNOSIS — M9909 Segmental and somatic dysfunction of abdomen and other regions: Secondary | ICD-10-CM | POA: Diagnosis not present

## 2017-02-17 DIAGNOSIS — M9905 Segmental and somatic dysfunction of pelvic region: Secondary | ICD-10-CM | POA: Diagnosis not present

## 2017-02-17 DIAGNOSIS — M9902 Segmental and somatic dysfunction of thoracic region: Secondary | ICD-10-CM | POA: Diagnosis not present

## 2017-02-17 NOTE — Progress Notes (Signed)
BP 129/83 (BP Location: Left Arm, Patient Position: Sitting, Cuff Size: Normal)   Pulse 86   Wt 209 lb 9.6 oz (95.1 kg)   LMP  (LMP Unknown)   SpO2 97%   BMI 34.67 kg/m    Subjective:    Patient ID: [name], female    DOB: 1956-03-12, 61 y.o.   MRN: [id number]  HPI: ALEXCIS BICKING is a 61 y.o. female  Chief Complaint  Patient presents with  . Back Pain   Trenell is here today complaining of L sided mid back pain that started about 10 days ago when she nearly fell, but caught herself when she was working [employer] on election day. She states that it started later that night and that she has never had any pain like this before. She was afraid it might be shingles, but has not developed a rash. She notes that the pain is burning in nuture very new and different. Nothing makes it better or worse. Been taking ibuprofen- as needed, not really helping but it eases it up a little bit. No radiation. She has been using an ice pack as well. She is otherwise doing OK. She's a little sad as her daughter's dog may have cancer, but she's trying to hang in there. She is otherwise doing well with no other concerns or complaints at this time.   Relevant past medical, surgical, family and social history reviewed and updated as indicated. Interim medical history since our last visit reviewed. Allergies and medications reviewed and updated.  Review of Systems  Constitutional: Negative.   Respiratory: Negative.   Cardiovascular: Negative.   Musculoskeletal: Positive for back pain and myalgias. Negative for arthralgias, gait problem, joint swelling, neck pain and neck stiffness.  Skin: Negative.   Neurological: Negative.   Psychiatric/Behavioral: Negative.     Per HPI unless specifically indicated above     Objective:    BP 129/83 (BP Location: Left Arm, Patient Position: Sitting, Cuff Size: Normal)   Pulse 86   Wt 209 lb 9.6 oz (95.1 kg)   LMP  (LMP Unknown)   SpO2 97%   BMI 34.67 kg/m    Wt Readings from Last 3 Encounters:  02/17/17 209 lb 9.6 oz (95.1 kg)  01/31/17 208 lb 2 oz (94.4 kg)  01/03/17 210 lb 8 oz (95.5 kg)    Physical Exam  Constitutional: She is oriented to person, place, and time. She appears well-developed and well-nourished. No distress.  HENT:  Head: Normocephalic and atraumatic.  Right Ear: Hearing normal.  Left Ear: Hearing normal.  Nose: Nose normal.  Eyes: Conjunctivae and lids are normal. Right eye exhibits no discharge. Left eye exhibits no discharge. No scleral icterus.  Cardiovascular: Normal rate, regular rhythm, normal heart sounds and intact distal pulses. Exam reveals no gallop and no friction rub.  No murmur heard. Pulmonary/Chest: Effort normal and breath sounds normal. No respiratory distress. She has no wheezes. She has no rales. She exhibits no tenderness.  Neurological: She is alert and oriented to person, place, and time.  Skin: Skin is warm, dry and intact. No rash noted. She is not diaphoretic. No erythema. No pallor.  Psychiatric: She has a normal mood and affect. Her speech is normal and behavior is normal. Judgment and thought content normal. Cognition and memory are normal.  Nursing note and vitals reviewed. Musculoskeletal:  Exam found Decreased ROM, Tissue texture changes, Tenderness to palpation and Asymmetry of patient's  thorax, ribs, lumbar, pelvis, sacrum and  abdomen Osteopathic Structural Exam:   Thorax: T3-6 SRRL, T7ESRR  Ribs: Ribs 5-8 locked down on the L  Lumbar: QL hypertonic on the L  Pelvis:  Anterior L innominate  Sacrum: L on L torsion  Abdomen: diaphragm spasm on the L   Results for orders placed or performed in visit on 11/23/16  Microscopic Examination  Result Value Ref Range   WBC, UA 0-5 0 - 5 /hpf   RBC, UA 0-2 0 - 2 /hpf   Epithelial Cells (non renal) 0-10 0 - 10 /hpf   Bacteria, UA None seen None seen/Few  UA/M w/rflx Culture, Routine  Result Value Ref Range   Specific Gravity, UA 1.020  1.005 - 1.030   pH, UA 7.0 5.0 - 7.5   Color, UA Yellow Yellow   Appearance Ur Clear Clear   Leukocytes, UA Negative Negative   Protein, UA Negative Negative/Trace   Glucose, UA Negative Negative   Ketones, UA Negative Negative   RBC, UA Negative Negative   Bilirubin, UA Negative Negative   Urobilinogen, Ur 0.2 0.2 - 1.0 mg/dL   Nitrite, UA Negative Negative   Microscopic Examination See below:       Assessment & Plan:   Problem List Items Addressed This Visit    None    Visit Diagnoses    Rib pain    -  Primary   Acute, seems to be due to a near fall on election day. She does have some somatic dysfunctio that I think would benefit from OMT. Treated today as below.    Lumbar region somatic dysfunction       Sacral region somatic dysfunction       Somatic dysfunction of pelvis region       Thoracic segment dysfunction       Somatic dysfunction of abdominal region       Rib cage region somatic dysfunction         After verbal consent was obtained, patient was treated today with osteopathic manipulative medicine to the regions of the thorax, ribs, lumbar, pelvis, sacrum and abdomen using the techniques of Still, FPR, myofascial release, counterstrain, muscle energy, HVLA and soft tissue. Areas of compensation relating to her primary pain source also treated. Patient tolerated the procedure well with good objective and good subjective improvement in symptoms. She left the room in good condition. She was advised to stay well hydrated and that she may have some soreness following the procedure. If not improving or worsening, she will call and come in. She will return for reevaluation  In 3-4 weeks.   Follow up plan: Return if symptoms worsen or fail to improve.

## 2017-02-24 ENCOUNTER — Ambulatory Visit: Payer: BC Managed Care – PPO | Admitting: Family Medicine

## 2017-02-28 ENCOUNTER — Ambulatory Visit: Payer: BC Managed Care – PPO | Admitting: Family Medicine

## 2017-03-20 ENCOUNTER — Ambulatory Visit: Payer: BC Managed Care – PPO | Admitting: Family Medicine

## 2017-04-24 ENCOUNTER — Ambulatory Visit: Payer: BC Managed Care – PPO | Admitting: Family Medicine

## 2017-04-25 ENCOUNTER — Encounter: Payer: Self-pay | Admitting: Family Medicine

## 2017-04-25 ENCOUNTER — Ambulatory Visit: Payer: BC Managed Care – PPO | Admitting: Family Medicine

## 2017-04-25 ENCOUNTER — Other Ambulatory Visit: Payer: Self-pay

## 2017-04-25 VITALS — BP 138/74 | HR 102 | Temp 98.4°F | Wt 207.8 lb

## 2017-04-25 DIAGNOSIS — F331 Major depressive disorder, recurrent, moderate: Secondary | ICD-10-CM | POA: Diagnosis not present

## 2017-04-25 MED ORDER — CITALOPRAM HYDROBROMIDE 40 MG PO TABS: 60.0000 mg | ORAL_TABLET | Freq: Every day | ORAL | 1 refills | Status: DC

## 2017-04-25 NOTE — Assessment & Plan Note (Signed)
Doing a little bit better. Still not under good control. Does not want to change her medicine right now. Continue to monitor. Refills given today. List of counselors given today. Call with any concerns.

## 2017-04-25 NOTE — Progress Notes (Signed)
BP 138/74   Pulse (!) 102   Temp 98.4 F (36.9 C) (Oral)   Wt 207 lb 12.8 oz (94.3 kg)   LMP  (LMP Unknown)   SpO2 97%   BMI 34.37 kg/m    Subjective:    Patient ID: Tracie Sheppard, female    DOB: 12/14/1955, 62 y.o.   MRN: [id number]  HPI: Tracie Sheppard is a 62 y.o. female  Chief Complaint  Patient presents with  . Depression   DEPRESSION- was really anxious around Christmas and had a lot of stress, still under a lot of stress regarding her daughter and her happiness and her marriage.  Mood status: better Satisfied with current treatment?: yes Symptom severity: moderate  Duration of current treatment : chronic Side effects: no Medication compliance: excellent compliance Psychotherapy/counseling: no  Previous psychiatric medications: celexa, klonapin, wellbutrin Depressed mood: yes Anxious mood: yes Anhedonia: no Significant weight loss or gain: no Insomnia: yes  Fatigue: yes Feelings of worthlessness or guilt: no Impaired concentration/indecisiveness: no Suicidal ideations: no Hopelessness: no Crying spells: yes Depression screen [facility] 2/9 04/25/2017 01/31/2017 01/03/2017 01/03/2017 12/06/2016  Decreased Interest 0 1 0 0 1  Down, Depressed, Hopeless 1 1 1 1 1   PHQ - 2 Score 1 2 1 1 2   Altered sleeping 2 3 3 3 3   Tired, decreased energy 2 1 2 2 3   Change in appetite 2 3 3 3 3   Feeling bad or failure about yourself  1 1 1 1 1   Trouble concentrating 0 1 1 1  0  Moving slowly or fidgety/restless 0 0 0 0 0  Suicidal thoughts 0 0 0 0 0  PHQ-9 Score 8 11 11 11 12   Difficult doing work/chores Somewhat difficult Somewhat difficult Somewhat difficult Somewhat difficult Somewhat difficult   GAD 7 : Generalized Anxiety Score 04/25/2017 12/06/2016  Nervous, Anxious, on Edge 1 1  Control/stop worrying 1 0  Worry too much - different things 1 0  Trouble relaxing 1 1  Restless 0 0  Easily annoyed or irritable 1 2  Afraid - awful might happen 1 0  Total GAD 7 Score 6 4    Anxiety Difficulty Somewhat difficult -    Relevant past medical, surgical, family and social history reviewed and updated as indicated. Interim medical history since our last visit reviewed. Allergies and medications reviewed and updated.  Review of Systems  Constitutional: Negative.   Respiratory: Negative.   Cardiovascular: Negative.   Neurological: Negative.   Psychiatric/Behavioral: Positive for dysphoric mood. Negative for agitation, behavioral problems, confusion, decreased concentration, hallucinations, self-injury, sleep disturbance and suicidal ideas. The patient is nervous/anxious. The patient is not hyperactive.     Per HPI unless specifically indicated above     Objective:    BP 138/74   Pulse (!) 102   Temp 98.4 F (36.9 C) (Oral)   Wt 207 lb 12.8 oz (94.3 kg)   LMP  (LMP Unknown)   SpO2 97%   BMI 34.37 kg/m   Wt Readings from Last 3 Encounters:  04/25/17 207 lb 12.8 oz (94.3 kg)  02/17/17 209 lb 9.6 oz (95.1 kg)  01/31/17 208 lb 2 oz (94.4 kg)    Physical Exam  Constitutional: She is oriented to person, place, and time. She appears well-developed and well-nourished. No distress.  HENT:  Head: Normocephalic and atraumatic.  Right Ear: Hearing normal.  Left Ear: Hearing normal.  Nose: Nose normal.  Eyes: Conjunctivae and lids are normal. Right eye exhibits no  discharge. Left eye exhibits no discharge. No scleral icterus.  Cardiovascular: Normal rate, regular rhythm, normal heart sounds and intact distal pulses. Exam reveals no gallop and no friction rub.  No murmur heard. Pulmonary/Chest: Effort normal and breath sounds normal. No respiratory distress. She has no wheezes. She has no rales. She exhibits no tenderness.  Musculoskeletal: Normal range of motion.  Neurological: She is alert and oriented to person, place, and time.  Skin: Skin is warm, dry and intact. No rash noted. She is not diaphoretic. No erythema. No pallor.  Psychiatric: Her speech is  normal and behavior is normal. Judgment and thought content normal. Her mood appears anxious. Cognition and memory are normal. She exhibits a depressed mood.  Nursing note and vitals reviewed.   Results for orders placed or performed in visit on 11/23/16  Microscopic Examination  Result Value Ref Range   WBC, UA 0-5 0 - 5 /hpf   RBC, UA 0-2 0 - 2 /hpf   Epithelial Cells (non renal) 0-10 0 - 10 /hpf   Bacteria, UA None seen None seen/Few  UA/M w/rflx Culture, Routine  Result Value Ref Range   Specific Gravity, UA 1.020 1.005 - 1.030   pH, UA 7.0 5.0 - 7.5   Color, UA Yellow Yellow   Appearance Ur Clear Clear   Leukocytes, UA Negative Negative   Protein, UA Negative Negative/Trace   Glucose, UA Negative Negative   Ketones, UA Negative Negative   RBC, UA Negative Negative   Bilirubin, UA Negative Negative   Urobilinogen, Ur 0.2 0.2 - 1.0 mg/dL   Nitrite, UA Negative Negative   Microscopic Examination See below:       Assessment & Plan:   Problem List Items Addressed This Visit      Other   Depression, major, recurrent, moderate ([place]) - Primary    Doing a little bit better. Still not under good control. Does not want to change her medicine right now. Continue to monitor. Refills given today. List of counselors given today. Call with any concerns.       Relevant Medications   citalopram (CELEXA) 40 MG tablet       Follow up plan: Return in about 3 months (around 07/24/2017) for Follow up .

## 2017-05-03 ENCOUNTER — Ambulatory Visit: Payer: BC Managed Care – PPO | Admitting: Family Medicine

## 2017-05-03 ENCOUNTER — Encounter: Payer: Self-pay | Admitting: Family Medicine

## 2017-05-03 VITALS — BP 144/83 | HR 92 | Temp 98.3°F | Wt 209.2 lb

## 2017-05-03 DIAGNOSIS — M9903 Segmental and somatic dysfunction of lumbar region: Secondary | ICD-10-CM

## 2017-05-03 DIAGNOSIS — M26609 Unspecified temporomandibular joint disorder, unspecified side: Secondary | ICD-10-CM | POA: Diagnosis not present

## 2017-05-03 DIAGNOSIS — M9908 Segmental and somatic dysfunction of rib cage: Secondary | ICD-10-CM

## 2017-05-03 DIAGNOSIS — M791 Myalgia, unspecified site: Secondary | ICD-10-CM

## 2017-05-03 DIAGNOSIS — M9902 Segmental and somatic dysfunction of thoracic region: Secondary | ICD-10-CM | POA: Diagnosis not present

## 2017-05-03 DIAGNOSIS — M9905 Segmental and somatic dysfunction of pelvic region: Secondary | ICD-10-CM | POA: Diagnosis not present

## 2017-05-03 DIAGNOSIS — M9904 Segmental and somatic dysfunction of sacral region: Secondary | ICD-10-CM | POA: Diagnosis not present

## 2017-05-03 DIAGNOSIS — M546 Pain in thoracic spine: Secondary | ICD-10-CM | POA: Diagnosis not present

## 2017-05-03 DIAGNOSIS — M9900 Segmental and somatic dysfunction of head region: Secondary | ICD-10-CM | POA: Diagnosis not present

## 2017-05-03 DIAGNOSIS — M9901 Segmental and somatic dysfunction of cervical region: Secondary | ICD-10-CM

## 2017-05-03 DIAGNOSIS — M9909 Segmental and somatic dysfunction of abdomen and other regions: Secondary | ICD-10-CM

## 2017-05-03 DIAGNOSIS — R202 Paresthesia of skin: Secondary | ICD-10-CM | POA: Diagnosis not present

## 2017-05-03 NOTE — Patient Instructions (Signed)
Dr. Sherre Lain, DDS, [facility] [address] [place], [place], [address] [phone]

## 2017-05-03 NOTE — Progress Notes (Signed)
BP (!) 144/83 (BP Location: Left Arm, Patient Position: Sitting, Cuff Size: Normal)   Pulse 92   Temp 98.3 F (36.8 C)   Wt 209 lb 3 oz (94.9 kg)   LMP  (LMP Unknown)   SpO2 98%   BMI 34.60 kg/m    Subjective:    Patient ID: Tracie Sheppard, female    DOB: 09-22-1955, 63 y.o.   MRN: [id number]  HPI: Tracie Sheppard is a 62 y.o. female  Chief Complaint  Patient presents with  . Back Pain   Has been feeling sore all over for a while. Having pain in her joints all the time. Having pain in her back when she's getting up. This is a different type of pain that she has been having. No fevers. No cold symptoms. Had a headache a couple of days ago, but seems to have gotten better. She notes that she has been eating a bit more sugar. Nothing really makes it better or worse. She notes that it may be a bit worse with position changes. No changes during the day. She is not really sure how to describe her pain. She is not sure what makes it better or worse. She would like to have OMT to see if it would help. She did recently stop her vitamin D supplement. She also notes that she is having a burning pain in her back on the L side just below her rib cage. Better with showers, worse with certain movements. She notes That she was concerned about a rash, but there isn't one.  Still having numbness and tingling in her hands. Would like to have this looked at more. Has had blood work that was normal. Now waking her up at night. Having trouble grasping things. Very irritating. Also notes that her jaw has been popping a lot and it hurts. Saw a dentist who told her to stop popping her jaw, but didn't do anything else. No other concerns or complaints at this time.   Relevant past medical, surgical, family and social history reviewed and updated as indicated. Interim medical history since our last visit reviewed. Allergies and medications reviewed and updated.  Review of Systems  Constitutional: Negative.     Respiratory: Negative.   Cardiovascular: Negative.   Musculoskeletal: Positive for arthralgias, back pain and myalgias. Negative for gait problem, joint swelling, neck pain and neck stiffness.  Skin: Negative.   Neurological: Positive for numbness and headaches. Negative for dizziness, tremors, seizures, syncope, facial asymmetry, speech difficulty and light-headedness.  Hematological: Negative.   Psychiatric/Behavioral: Negative.     Per HPI unless specifically indicated above     Objective:    BP (!) 144/83 (BP Location: Left Arm, Patient Position: Sitting, Cuff Size: Normal)   Pulse 92   Temp 98.3 F (36.8 C)   Wt 209 lb 3 oz (94.9 kg)   LMP  (LMP Unknown)   SpO2 98%   BMI 34.60 kg/m   Wt Readings from Last 3 Encounters:  05/03/17 209 lb 3 oz (94.9 kg)  04/25/17 207 lb 12.8 oz (94.3 kg)  02/17/17 209 lb 9.6 oz (95.1 kg)    Physical Exam  Constitutional: She is oriented to person, place, and time. She appears well-developed and well-nourished. No distress.  HENT:  Head: Normocephalic and atraumatic.  Right Ear: Hearing normal.  Left Ear: Hearing normal.  Nose: Nose normal.  significant popping and dislocation on jaw opening bilaterally  Eyes: Conjunctivae and lids are normal. Right eye exhibits  no discharge. Left eye exhibits no discharge. No scleral icterus.  Cardiovascular: Normal rate, regular rhythm, normal heart sounds and intact distal pulses. Exam reveals no gallop and no friction rub.  No murmur heard. Pulmonary/Chest: Effort normal and breath sounds normal. No respiratory distress. She has no wheezes. She has no rales. She exhibits no tenderness.  Abdominal: Soft. She exhibits no distension and no mass. There is no tenderness. There is no rebound and no guarding.  Neurological: She is alert and oriented to person, place, and time.  Skin: Skin is warm, dry and intact. No rash noted. She is not diaphoretic. No erythema. No pallor.  Psychiatric: She has a normal  mood and affect. Her speech is normal and behavior is normal. Judgment and thought content normal. Cognition and memory are normal.  Nursing note and vitals reviewed. Musculoskeletal:  Exam found Decreased ROM, Tissue texture changes, Tenderness to palpation and Asymmetry of patient's  head, neck, thorax, ribs, lumbar, pelvis, sacrum and abdomen Osteopathic Structural Exam:   Head: hypertonic suboccipital muscles, OAESSR, OMsuture restricted on the R, masseter hypertonic bilaterally  Neck: C3ESRR, trap spasm bilaterally R>L, SCM hypertonic on the R  Thorax: T3-5SLRR  Ribs: Ribs 7-9 locked up on the L  Lumbar: QL hypertonic on the R, L4-5SLRR  Pelvis: Anterior R innominate  Sacrum: R on R torsion  Abdomen: diaphragm spasm bilaterally, pelvic diaphragm restricted and pulled into R hip   Results for orders placed or performed in visit on 11/23/16  Microscopic Examination  Result Value Ref Range   WBC, UA 0-5 0 - 5 /hpf   RBC, UA 0-2 0 - 2 /hpf   Epithelial Cells (non renal) 0-10 0 - 10 /hpf   Bacteria, UA None seen None seen/Few  UA/M w/rflx Culture, Routine  Result Value Ref Range   Specific Gravity, UA 1.020 1.005 - 1.030   pH, UA 7.0 5.0 - 7.5   Color, UA Yellow Yellow   Appearance Ur Clear Clear   Leukocytes, UA Negative Negative   Protein, UA Negative Negative/Trace   Glucose, UA Negative Negative   Ketones, UA Negative Negative   RBC, UA Negative Negative   Bilirubin, UA Negative Negative   Urobilinogen, Ur 0.2 0.2 - 1.0 mg/dL   Nitrite, UA Negative Negative   Microscopic Examination See below:       Assessment & Plan:   Problem List Items Addressed This Visit      Musculoskeletal and Integument   TMJ (temporomandibular joint syndrome)   Relevant Orders   Ambulatory referral to Dentistry    Other Visit Diagnoses    Paresthesias    -  Primary   Will get her into see neurology. Referral generated today.   Relevant Orders   Ambulatory referral to Neurology    Myalgia       Likely due to not taking her vitamin D. Will restart medicine. Call with any concerns.    Acute left-sided thoracic back pain       Seems to be irritation. She does have some somatic dysfunction. I think she would benefit from OMT. treated today with good results. Call with any concerns.    Lumbar region somatic dysfunction       Sacral region somatic dysfunction       Somatic dysfunction of pelvis region       Thoracic segment dysfunction       Somatic dysfunction of abdominal region       Rib cage region somatic dysfunction  Cervical somatic dysfunction       Head region somatic dysfunction         After verbal consent was obtained, patient was treated today with osteopathic manipulative medicine to the regions of the head, neck, thorax, ribs, lumbar, pelvis, sacrum and abdomen using the techniques of cranial, myofascial release, counterstrain, muscle energy, HVLA and soft tissue. Areas of compensation relating to her primary pain source also treated. Patient tolerated the procedure well with good objective and good subjective improvement in symptoms. She left the room in good condition. She was advised to stay well hydrated and that she may have some soreness following the procedure. If not improving or worsening, she will call and come in. She will return for reevaluation  on a PRN basis.   Follow up plan: Return if symptoms worsen or fail to improve.

## 2017-05-23 ENCOUNTER — Ambulatory Visit: Payer: BC Managed Care – PPO | Admitting: Neurology

## 2017-05-23 ENCOUNTER — Encounter: Payer: Self-pay | Admitting: Neurology

## 2017-05-23 VITALS — BP 146/78 | HR 83 | Ht 64.0 in | Wt 208.0 lb

## 2017-05-23 DIAGNOSIS — R202 Paresthesia of skin: Secondary | ICD-10-CM | POA: Diagnosis not present

## 2017-05-23 NOTE — Progress Notes (Signed)
Reason for visit: Hand paresthesias  Referring physician: Dr. Corky Crafts is a 62 y.o. female  History of present illness:  Ms. Shands is a 62 year old right-handed black female with a history of obesity and glucose intolerance.  The patient has a several year history of numbness and tingling in both hands that has gradually worsened over time.  The patient is getting more discomfort with use of the hands and some achy sensations into the elbows and upper arms bilaterally.  She believes the symptoms are equal from one side to the next.  The patient does also note some neck discomfort.  She is having some problems with gripping things at times, she does not drop things.  The numbness and tingling may wake her up at night as well.  The patient reports no numbness and tingling in the feet, she denies any true weakness of the extremities or difficulty with balance.  The patient does have some stress incontinence of the bladder, no problems controlling the bowels.  She denies any numbness on the body or face.  She is sent to this office for further evaluation of the above symptoms.  Past Medical History:  Diagnosis Date  . Anxiety   . Chronic back pain   . Diabetes mellitus without complication ([place])    Prediabetic  . High serum testosterone   . Kidney stone   . Thyroid disease    did not show up in lab work but per other symptoms previous doctor started her on this    Past Surgical History:  Procedure Laterality Date  . CHOLECYSTECTOMY     1997  . NASAL SEPTUM SURGERY    . OOPHORECTOMY    . TUBAL LIGATION     1989    Family History  Problem Relation Age of Onset  . Diabetes Mother   . Anemia Mother   . Hyperlipidemia Mother   . Hypertension Father   . Cancer Father   . Celiac disease Sister   . Diabetes Maternal Grandmother   . Stroke Maternal Grandmother   . Heart attack Maternal Grandmother   . Heart disease Paternal Grandfather     Social history:   reports that  has never smoked. she has never used smokeless tobacco. She reports that she drinks alcohol. She reports that she does not use drugs.  Medications:  Prior to Admission medications   Medication Sig Start Date End Date Taking? Authorizing Provider  buPROPion (WELLBUTRIN XL) 150 MG 24 hr tablet Take 1 tablet (150 mg total) by mouth daily. 01/31/17  Yes Johnson, Megan P, DO  cetirizine (ZYRTEC) 10 MG tablet Take 10 mg by mouth daily as needed for allergies.   Yes [provider]  citalopram (CELEXA) 40 MG tablet Take 1.5 tablets (60 mg total) by mouth daily. 04/25/17  Yes Johnson, Megan P, DO  clonazePAM (KLONOPIN) 0.5 MG tablet TAKE 1 TABLET BY MOUTH DAILY AS NEEDED FOR ANIXETY 08/02/16  Yes Johnson, Megan P, DO  Eflornithine HCl (VANIQA) 13.9 % cream Apply topically 2 (two) times daily with a meal.   Yes [provider]  fluticasone (FLONASE) 50 MCG/ACT nasal spray 1 spray by Each Nare route daily. 09/28/15  Yes Johnson, Megan P, DO  ibuprofen (GOODSENSE IBUPROFEN) 200 MG tablet Take 200 mg by mouth as needed.    Yes [provider]  Red Yeast Rice Extract (RED YEAST RICE PO) Take 1,200 mg by mouth daily.   Yes [provider]  spironolactone (ALDACTONE) 100 MG tablet Take 1 tablet (100 mg total) by mouth 2 (two) times daily. 11/02/16  Yes Johnson, Megan P, DO  thyroid (ARMOUR THYROID) 60 MG tablet TAKE 1 TABLET BY MOUTH EVERY DAY BEFORE BREAKFAST 11/03/16  Yes Johnson, Megan P, DO      Allergies  Allergen Reactions  . Aleve [Naproxen Sodium] Other (See Comments)    Chest pain    ROS:  Out of a complete 14 system review of symptoms, the patient complains only of the following symptoms, and all other reviewed systems are negative.  Fatigue Difficulty swallowing Snoring Feeling hot Joint pain, aching muscles Numbness, tremor Anxiety, none of sleep, decreased energy Insomnia, sleepiness  Blood pressure (!) 146/78, pulse 83, height 5\' 4"  (1.626  m), weight 208 lb (94.3 kg).  Physical Exam  General: The patient is alert and cooperative at the time of the examination.  The patient is moderately obese.  Eyes: Pupils are equal, round, and reactive to light. Discs are flat bilaterally.  Neck: The neck is supple, no carotid bruits are noted.  Respiratory: The respiratory examination is clear.  Cardiovascular: The cardiovascular examination reveals a regular rate and rhythm, no obvious murmurs or rubs are noted.  Skin: Extremities are without significant edema.  Neurologic Exam  Mental status: The patient is alert and oriented x 3 at the time of the examination. The patient has apparent normal recent and remote memory, with an apparently normal attention span and concentration ability.  Cranial nerves: Facial symmetry is present. There is good sensation of the face to pinprick and soft touch bilaterally. The strength of the facial muscles and the muscles to head turning and shoulder shrug are normal bilaterally. Speech is well enunciated, no aphasia or dysarthria is noted. Extraocular movements are full. Visual fields are full. The tongue is midline, and the patient has symmetric elevation of the soft palate. No obvious hearing deficits are noted.  Motor: The motor testing reveals 5 over 5 strength of all 4 extremities. Good symmetric motor tone is noted throughout.  Sensory: Sensory testing is intact to pinprick, soft touch, vibration sensation, and position sense on all 4 extremities. No evidence of extinction is noted.  Coordination: Cerebellar testing reveals good finger-nose-finger and heel-to-shin bilaterally.  Tinel sign at the wrists were positive bilaterally.  Gait and station: Gait is normal. Tandem gait is normal. Romberg is negative. No drift is seen.  Reflexes: Deep tendon reflexes are symmetric and normal bilaterally. Toes are downgoing bilaterally.   Assessment/Plan:  1.  Hand paresthesias, probable carpal tunnel  syndrome  The patient will be set up for nerve conduction studies on both arms, EMG on one arm.  If the patient does have carpal tunnel syndrome, we will treat her accordingly.  Jill Alexanders MD 05/23/2017 3:26 PM  [facility] [address] [place],  [address]  Phone [phone] Fax [phone]

## 2017-05-23 NOTE — Patient Instructions (Signed)
   We will check EMG and NCV study evaluation of the nerves of the arms.

## 2017-05-30 ENCOUNTER — Encounter: Payer: Self-pay | Admitting: Neurology

## 2017-05-30 ENCOUNTER — Encounter: Payer: BC Managed Care – PPO | Admitting: Neurology

## 2017-05-30 ENCOUNTER — Ambulatory Visit (INDEPENDENT_AMBULATORY_CARE_PROVIDER_SITE_OTHER): Payer: BC Managed Care – PPO | Admitting: Neurology

## 2017-05-30 DIAGNOSIS — G5603 Carpal tunnel syndrome, bilateral upper limbs: Secondary | ICD-10-CM

## 2017-05-30 DIAGNOSIS — R202 Paresthesia of skin: Secondary | ICD-10-CM | POA: Diagnosis not present

## 2017-05-30 DIAGNOSIS — Z0289 Encounter for other administrative examinations: Secondary | ICD-10-CM

## 2017-05-30 HISTORY — DX: Carpal tunnel syndrome, bilateral upper limbs: G56.03

## 2017-05-30 NOTE — Progress Notes (Signed)
The patient comes in today for EMG nerve conduction study evaluation.  Nerve conduction studies have shown evidence of bilateral mild carpal tunnel syndrome.  EMG of the left upper extremity was unremarkable.  The patient will be given wrist splints for both wrists.  If she gains no benefit after 2 months she is to contact our office and we will set her up for a referral to a hand surgeon.

## 2017-05-30 NOTE — Procedures (Signed)
     HISTORY:  Tracie Sheppard is a 62 year old patient with a history of paresthesias involving both upper extremities for several months.  The patient is being evaluated for a possible neuropathy or a cervical radiculopathy.  NERVE CONDUCTION STUDIES:  Nerve conduction studies were performed on both upper extremities.  The distal motor latencies for the median nerves were prolonged bilaterally with normal motor amplitudes for these nerves bilaterally.  The distal motor latencies and motor amplitudes for the ulnar nerves were normal bilaterally.  The nerve conduction velocities for the median and ulnar nerves were normal bilaterally.  The sensory latencies for the median nerves were prolonged bilaterally, normal for the ulnar nerves bilaterally.  The F-wave latencies for the ulnar nerves were normal bilaterally.  EMG STUDIES:  EMG study was performed on the left upper extremity:  The first dorsal interosseous muscle reveals 2 to 4 K units with full recruitment. No fibrillations or positive waves were noted. The abductor pollicis brevis muscle reveals 2 to 5 K units with minimally decreased recruitment. No fibrillations or positive waves were noted. The extensor indicis proprius muscle reveals 1 to 3 K units with full recruitment. No fibrillations or positive waves were noted. The pronator teres muscle reveals 2 to 3 K units with full recruitment. No fibrillations or positive waves were noted. The biceps muscle reveals 1 to 2 K units with full recruitment. No fibrillations or positive waves were noted. The triceps muscle reveals 2 to 4 K units with full recruitment. No fibrillations or positive waves were noted. The anterior deltoid muscle reveals 2 to 3 K units with full recruitment. No fibrillations or positive waves were noted. The cervical paraspinal muscles were tested at 2 levels. No abnormalities of insertional activity were seen at either level tested. There was good  relaxation.   IMPRESSION:  Nerve conduction studies done on both upper extremities show evidence of mild bilateral carpal tunnel syndrome.  EMG evaluation of the left upper extremity was relatively unremarkable without evidence of an overlying cervical radiculopathy.  Jill Alexanders MD 05/30/2017 3:52 PM  [facility] [address] [place], [place] [address]  Phone [phone] Fax [phone]

## 2017-06-01 ENCOUNTER — Encounter: Payer: Self-pay | Admitting: Family Medicine

## 2017-06-01 ENCOUNTER — Ambulatory Visit: Payer: BC Managed Care – PPO | Admitting: Family Medicine

## 2017-06-01 VITALS — BP 131/82 | HR 89 | Temp 98.6°F | Wt 206.1 lb

## 2017-06-01 DIAGNOSIS — J0100 Acute maxillary sinusitis, unspecified: Secondary | ICD-10-CM | POA: Diagnosis not present

## 2017-06-01 MED ORDER — AMOXICILLIN-POT CLAVULANATE 875-125 MG PO TABS: 1.0000 | ORAL_TABLET | Freq: Two times a day (BID) | ORAL | 0 refills | Status: DC

## 2017-06-01 NOTE — Progress Notes (Signed)
BP 131/82 (BP Location: Left Arm, Patient Position: Sitting, Cuff Size: Normal)   Pulse 89   Temp 98.6 F (37 C)   Wt 206 lb 1 oz (93.5 kg)   LMP  (LMP Unknown)   SpO2 96%   BMI 35.37 kg/m    Subjective:    Patient ID: Tracie Sheppard, female    DOB: 02-Apr-1956, 62 y.o.   MRN: [id number]  HPI: Tracie Sheppard is a 62 y.o. female  Chief Complaint  Patient presents with  . Sore Throat    earache, fatigue X 1 week   UPPER RESPIRATORY TRACT INFECTION Duration: 1+ week Worst symptom: clogged ears Fever: no Cough: yes Shortness of breath: no Wheezing: yes Chest pain: no Chest tightness: no Chest congestion: no Nasal congestion: yes Runny nose: no Post nasal drip: yes Sneezing: no Sore throat: yes Swollen glands: no Sinus pressure: yes Headache: yes Face pain: yes Toothache: yes Ear pain: yes bilateral Ear pressure: yes bilateral Eyes red/itching:no Eye drainage/crusting: no  Vomiting: no Rash: no Fatigue: yes Sick contacts: yes Strep contacts: no  Context: worse Recurrent sinusitis: no Relief with OTC cold/cough medications: no  Treatments attempted: none   Relevant past medical, surgical, family and social history reviewed and updated as indicated. Interim medical history since our last visit reviewed. Allergies and medications reviewed and updated.  Review of Systems  Constitutional: Positive for fatigue. Negative for activity change, appetite change, chills, diaphoresis, fever and unexpected weight change.  HENT: Positive for congestion, ear pain, postnasal drip, rhinorrhea, sinus pressure, sinus pain and sore throat. Negative for dental problem, drooling, ear discharge, facial swelling, hearing loss, mouth sores, nosebleeds, sneezing, tinnitus, trouble swallowing and voice change.   Respiratory: Positive for cough, shortness of breath and wheezing. Negative for apnea, choking, chest tightness and stridor.   Cardiovascular: Negative.     Psychiatric/Behavioral: Negative.     Per HPI unless specifically indicated above     Objective:    BP 131/82 (BP Location: Left Arm, Patient Position: Sitting, Cuff Size: Normal)   Pulse 89   Temp 98.6 F (37 C)   Wt 206 lb 1 oz (93.5 kg)   LMP  (LMP Unknown)   SpO2 96%   BMI 35.37 kg/m   Wt Readings from Last 3 Encounters:  06/01/17 206 lb 1 oz (93.5 kg)  05/23/17 208 lb (94.3 kg)  05/03/17 209 lb 3 oz (94.9 kg)    Physical Exam  Constitutional: She is oriented to person, place, and time. She appears well-developed and well-nourished. No distress.  HENT:  Head: Normocephalic and atraumatic.  Right Ear: Hearing, tympanic membrane, external ear and ear canal normal.  Left Ear: Hearing, tympanic membrane, external ear and ear canal normal.  Nose: Mucosal edema and rhinorrhea present. Right sinus exhibits maxillary sinus tenderness. Right sinus exhibits no frontal sinus tenderness. Left sinus exhibits no maxillary sinus tenderness and no frontal sinus tenderness.  Mouth/Throat: Uvula is midline, oropharynx is clear and moist and mucous membranes are normal.  Eyes: Conjunctivae, EOM and lids are normal. Pupils are equal, round, and reactive to light. Right eye exhibits no discharge. Left eye exhibits no discharge. No scleral icterus.  Neck: Normal range of motion. Neck supple. No JVD present. No tracheal deviation present. No thyromegaly present.  Cardiovascular: Normal rate, regular rhythm, normal heart sounds and intact distal pulses. Exam reveals no gallop and no friction rub.  No murmur heard. Pulmonary/Chest: Effort normal and breath sounds normal. No stridor. No respiratory distress.  She has no wheezes. She has no rales. She exhibits no tenderness.  Musculoskeletal: Normal range of motion.  Lymphadenopathy:    She has no cervical adenopathy.  Neurological: She is alert and oriented to person, place, and time.  Skin: Skin is warm, dry and intact. No rash noted. She is not  diaphoretic. No erythema. No pallor.  Psychiatric: She has a normal mood and affect. Her speech is normal and behavior is normal. Judgment and thought content normal. Cognition and memory are normal.  Nursing note and vitals reviewed.   Results for orders placed or performed in visit on 11/23/16  Microscopic Examination  Result Value Ref Range   WBC, UA 0-5 0 - 5 /hpf   RBC, UA 0-2 0 - 2 /hpf   Epithelial Cells (non renal) 0-10 0 - 10 /hpf   Bacteria, UA None seen None seen/Few  UA/M w/rflx Culture, Routine  Result Value Ref Range   Specific Gravity, UA 1.020 1.005 - 1.030   pH, UA 7.0 5.0 - 7.5   Color, UA Yellow Yellow   Appearance Ur Clear Clear   Leukocytes, UA Negative Negative   Protein, UA Negative Negative/Trace   Glucose, UA Negative Negative   Ketones, UA Negative Negative   RBC, UA Negative Negative   Bilirubin, UA Negative Negative   Urobilinogen, Ur 0.2 0.2 - 1.0 mg/dL   Nitrite, UA Negative Negative   Microscopic Examination See below:       Assessment & Plan:   Problem List Items Addressed This Visit    None    Visit Diagnoses    Acute non-recurrent maxillary sinusitis    -  Primary   Will treat with augmentin. Call with any concerns or if not getting better.   Relevant Medications   amoxicillin-clavulanate (AUGMENTIN) 875-125 MG tablet       Follow up plan: Return if symptoms worsen or fail to improve.

## 2017-06-12 ENCOUNTER — Other Ambulatory Visit: Payer: Self-pay | Admitting: Family Medicine

## 2017-06-23 ENCOUNTER — Encounter: Payer: Self-pay | Admitting: Family Medicine

## 2017-06-27 ENCOUNTER — Encounter: Payer: Self-pay | Admitting: Family Medicine

## 2017-07-06 ENCOUNTER — Ambulatory Visit: Payer: BC Managed Care – PPO | Admitting: Family Medicine

## 2017-07-06 ENCOUNTER — Encounter: Payer: Self-pay | Admitting: Family Medicine

## 2017-07-06 VITALS — BP 121/81 | HR 74 | Temp 98.2°F | Wt 208.3 lb

## 2017-07-06 DIAGNOSIS — H6983 Other specified disorders of Eustachian tube, bilateral: Secondary | ICD-10-CM

## 2017-07-06 DIAGNOSIS — M2550 Pain in unspecified joint: Secondary | ICD-10-CM

## 2017-07-06 DIAGNOSIS — R768 Other specified abnormal immunological findings in serum: Secondary | ICD-10-CM | POA: Diagnosis not present

## 2017-07-06 MED ORDER — CLONAZEPAM 0.5 MG PO TABS: ORAL_TABLET | ORAL | 0 refills | Status: DC

## 2017-07-06 MED ORDER — PREDNISONE 50 MG PO TABS: 50.0000 mg | ORAL_TABLET | Freq: Every day | ORAL | 0 refills | Status: DC

## 2017-07-06 MED ORDER — CETIRIZINE HCL 10 MG PO TABS: 10.0000 mg | ORAL_TABLET | Freq: Every day | ORAL | 3 refills | Status: DC | PRN

## 2017-07-06 NOTE — Assessment & Plan Note (Signed)
Will get her back in to see rheumatology.

## 2017-07-06 NOTE — Progress Notes (Signed)
BP 121/81 (BP Location: Left Wrist, Patient Position: Sitting, Cuff Size: Normal)   Pulse 74   Temp 98.2 F (36.8 C) (Oral)   Wt 208 lb 5 oz (94.5 kg)   LMP  (LMP Unknown)   SpO2 99%   BMI 35.76 kg/m    Subjective:    Patient ID: Tracie Sheppard, female    DOB: 12/11/1955, 62 y.o.   MRN: [id number]  HPI: Tracie Sheppard is a 62 y.o. female  Chief Complaint  Patient presents with  . Follow-up    sinus infection   EAR PAIN Duration: about 5 weeks Involved ear(s): bilateral, R full feeling, L ear, painful Severity:  mild  Quality:  Dull and uncomfortable on L, pressure on the R Fever: no Otorrhea: no Upper respiratory infection symptoms: yes Pruritus: yes Hearing loss: no Water immersion no Using Q-tips: yes Recurrent otitis media: no Status: better Treatments attempted: antibiotics about a month ago  Joint pain has gotten worse. Has not followed up with rheumatology. Unsure if she got her labs done.   Relevant past medical, surgical, family and social history reviewed and updated as indicated. Interim medical history since our last visit reviewed. Allergies and medications reviewed and updated.  Review of Systems  Constitutional: Negative.   HENT: Positive for congestion, ear discharge, ear pain and sinus pressure. Negative for dental problem, drooling, facial swelling, hearing loss, mouth sores, nosebleeds, postnasal drip, rhinorrhea, sinus pain, sneezing, sore throat, tinnitus, trouble swallowing and voice change.   Eyes: Negative.   Respiratory: Negative.   Cardiovascular: Negative.   Psychiatric/Behavioral: Negative.     Per HPI unless specifically indicated above     Objective:    BP 121/81 (BP Location: Left Wrist, Patient Position: Sitting, Cuff Size: Normal)   Pulse 74   Temp 98.2 F (36.8 C) (Oral)   Wt 208 lb 5 oz (94.5 kg)   LMP  (LMP Unknown)   SpO2 99%   BMI 35.76 kg/m   Wt Readings from Last 3 Encounters:  07/06/17 208 lb 5 oz (94.5 kg)    06/01/17 206 lb 1 oz (93.5 kg)  05/23/17 208 lb (94.3 kg)    Physical Exam  Constitutional: She is oriented to person, place, and time. She appears well-developed and well-nourished. No distress.  HENT:  Head: Normocephalic and atraumatic.  Right Ear: Hearing normal.  Left Ear: Hearing normal.  Nose: Nose normal.  Mouth/Throat: Oropharynx is clear and moist. No oropharyngeal exudate.  Eyes: Pupils are equal, round, and reactive to light. Conjunctivae, EOM and lids are normal. Right eye exhibits no discharge. Left eye exhibits no discharge. No scleral icterus.  Neck: Normal range of motion. Neck supple. No JVD present. No tracheal deviation present. No thyromegaly present.  Cardiovascular: Normal rate, regular rhythm, normal heart sounds and intact distal pulses. Exam reveals no gallop and no friction rub.  No murmur heard. Pulmonary/Chest: Effort normal and breath sounds normal. No stridor. No respiratory distress. She has no wheezes. She has no rales. She exhibits no tenderness.  Musculoskeletal: Normal range of motion.  Lymphadenopathy:    She has no cervical adenopathy.  Neurological: She is alert and oriented to person, place, and time.  Skin: Skin is warm, dry and intact. No rash noted. She is not diaphoretic. No erythema. No pallor.  Psychiatric: She has a normal mood and affect. Her speech is normal and behavior is normal. Judgment and thought content normal. Cognition and memory are normal.  Nursing note and vitals reviewed.  Results for orders placed or performed in visit on 11/23/16  Microscopic Examination  Result Value Ref Range   WBC, UA 0-5 0 - 5 /hpf   RBC, UA 0-2 0 - 2 /hpf   Epithelial Cells (non renal) 0-10 0 - 10 /hpf   Bacteria, UA None seen None seen/Few  UA/M w/rflx Culture, Routine  Result Value Ref Range   Specific Gravity, UA 1.020 1.005 - 1.030   pH, UA 7.0 5.0 - 7.5   Color, UA Yellow Yellow   Appearance Ur Clear Clear   Leukocytes, UA Negative  Negative   Protein, UA Negative Negative/Trace   Glucose, UA Negative Negative   Ketones, UA Negative Negative   RBC, UA Negative Negative   Bilirubin, UA Negative Negative   Urobilinogen, Ur 0.2 0.2 - 1.0 mg/dL   Nitrite, UA Negative Negative   Microscopic Examination See below:       Assessment & Plan:   Problem List Items Addressed This Visit      Other   Rheumatoid factor positive    Will get her back in to see rheumatology.       Relevant Orders   Ambulatory referral to Rheumatology    Other Visit Diagnoses    Dysfunction of both eustachian tubes    -  Primary   Will treat with prednisone. Call with any concerns or if not getting better.    Arthralgia, unspecified joint       Widespread and getting worse. Will check labs and get her back into see rheumatology. Call with any concerns. Await results.    Relevant Orders   CBC with Differential/Platelet   RA Qn+CCP(IgG/A)+SjoSSA+SjoSSB   Comprehensive metabolic panel   VITAMIN D 25 Hydroxy (Vit-D Deficiency, Fractures)   Lyme Ab/Western Blot Reflex   Rocky mtn spotted fvr abs pnl(IgG+IgM)   Ehrlichia Antibody Panel   Babesia microti Antibody Panel   Antinuclear Antib (ANA)   CK (Creatine Kinase)       Follow up plan: Return if symptoms worsen or fail to improve.

## 2017-07-07 ENCOUNTER — Ambulatory Visit: Payer: BC Managed Care – PPO | Admitting: Family Medicine

## 2017-07-07 ENCOUNTER — Encounter: Payer: Self-pay | Admitting: Family Medicine

## 2017-07-07 VITALS — BP 121/77 | HR 85 | Temp 97.9°F | Wt 208.3 lb

## 2017-07-07 DIAGNOSIS — M9904 Segmental and somatic dysfunction of sacral region: Secondary | ICD-10-CM

## 2017-07-07 DIAGNOSIS — G540 Brachial plexus disorders: Secondary | ICD-10-CM | POA: Diagnosis not present

## 2017-07-07 DIAGNOSIS — M9905 Segmental and somatic dysfunction of pelvic region: Secondary | ICD-10-CM

## 2017-07-07 DIAGNOSIS — M9900 Segmental and somatic dysfunction of head region: Secondary | ICD-10-CM | POA: Diagnosis not present

## 2017-07-07 DIAGNOSIS — M9903 Segmental and somatic dysfunction of lumbar region: Secondary | ICD-10-CM

## 2017-07-07 DIAGNOSIS — M9902 Segmental and somatic dysfunction of thoracic region: Secondary | ICD-10-CM

## 2017-07-07 DIAGNOSIS — M2550 Pain in unspecified joint: Secondary | ICD-10-CM

## 2017-07-07 DIAGNOSIS — M9909 Segmental and somatic dysfunction of abdomen and other regions: Secondary | ICD-10-CM | POA: Diagnosis not present

## 2017-07-07 DIAGNOSIS — G8929 Other chronic pain: Secondary | ICD-10-CM

## 2017-07-07 DIAGNOSIS — M9901 Segmental and somatic dysfunction of cervical region: Secondary | ICD-10-CM

## 2017-07-07 DIAGNOSIS — M545 Low back pain: Secondary | ICD-10-CM | POA: Diagnosis not present

## 2017-07-07 DIAGNOSIS — M9908 Segmental and somatic dysfunction of rib cage: Secondary | ICD-10-CM | POA: Diagnosis not present

## 2017-07-07 NOTE — Progress Notes (Signed)
BP 121/77 (BP Location: Left Arm, Patient Position: Sitting, Cuff Size: Normal)   Pulse 85   Temp 97.9 F (36.6 C)   Wt 208 lb 5 oz (94.5 kg)   LMP  (LMP Unknown)   SpO2 98%   BMI 35.76 kg/m    Subjective:    Patient ID: Tracie Sheppard, female    DOB: July 14, 1955, 62 y.o.   MRN: [id number]  HPI: Tracie Sheppard is a 62 y.o. female  Chief Complaint  Patient presents with  . Back Pain   Feeling a little better with her myalgias from yesterday- started on prednisone yesterday. Had an elevated CK yesterday- has been taking red yeast rice for about a year. Does note that her sister has also had an elevated RF in the past.  She notes that today, her pain is primarily in her R shoulder and R hip. Her hip pain goes into her buttocks as well. It is aching in nature and better with OMT and heat and rest. It's worse with a lot of sitting or walking. It's pretty constant. Shoulder pain comes from her neck and doesn't go down her arm. It's also aching and pulling. It's better with OMT and heat and rest. It's worse with stress and certain movements. It comes and goes, but lasts for hours to days at a time when she has more stress. She does note that she did well following her last appointment and has been doing OK. She feels like she is at her baseline. No other concerns or complaints at this time.   Relevant past medical, surgical, family and social history reviewed and updated as indicated. Interim medical history since our last visit reviewed. Allergies and medications reviewed and updated.  Review of Systems  Constitutional: Negative.   Respiratory: Negative.   Cardiovascular: Negative.   Gastrointestinal: Negative.   Musculoskeletal: Positive for arthralgias, back pain, joint swelling, myalgias, neck pain and neck stiffness. Negative for gait problem.  Skin: Negative.   Neurological: Negative.   Psychiatric/Behavioral: Negative for agitation, behavioral problems, confusion, decreased  concentration, dysphoric mood, hallucinations, self-injury, sleep disturbance and suicidal ideas. The patient is nervous/anxious. The patient is not hyperactive.     Per HPI unless specifically indicated above     Objective:    BP 121/77 (BP Location: Left Arm, Patient Position: Sitting, Cuff Size: Normal)   Pulse 85   Temp 97.9 F (36.6 C)   Wt 208 lb 5 oz (94.5 kg)   LMP  (LMP Unknown)   SpO2 98%   BMI 35.76 kg/m   Wt Readings from Last 3 Encounters:  07/07/17 208 lb 5 oz (94.5 kg)  07/06/17 208 lb 5 oz (94.5 kg)  06/01/17 206 lb 1 oz (93.5 kg)    Physical Exam  Constitutional: She is oriented to person, place, and time. She appears well-developed and well-nourished. No distress.  HENT:  Head: Normocephalic and atraumatic.  Right Ear: Hearing normal.  Left Ear: Hearing normal.  Nose: Nose normal.  Eyes: Conjunctivae and lids are normal. Right eye exhibits no discharge. Left eye exhibits no discharge. No scleral icterus.  Pulmonary/Chest: Effort normal. No respiratory distress.  Abdominal: Soft. She exhibits no distension and no mass. There is no tenderness. There is no rebound and no guarding.  Neurological: She is alert and oriented to person, place, and time.  Skin: Skin is warm, dry and intact. No rash noted. She is not diaphoretic. No erythema. No pallor.  Psychiatric: Her speech is normal and  behavior is normal. Judgment and thought content normal. Her mood appears anxious. Cognition and memory are normal.  Nursing note and vitals reviewed. Musculoskeletal:  Exam found Decreased ROM, Tissue texture changes, Tenderness to palpation and Asymmetry of patient's  head, neck, thorax, ribs, lumbar, pelvis, sacrum and abdomen Osteopathic Structural Exam:   Head: OAESSR, masseter and lateral pterygoid hypertonic on the R, hypertonic suboccipital muscles, L SBS torsion, OM suture restricted on the R  Neck: C3ESRR, SCM hypertonic on the R, trap hypertonic on the R  Thorax:  hypertonic trap R>L, pec hypertonic on the R, T3-5SLRR  Ribs: RIbs 6-8 on L locked up  Lumbar: QL hypertonic on the R, psoas spasm on the R with fascial strain into R innominate  Pelvis: Posterior R innominate  Sacrum: L on L torsion  Abdomen: diaphragm hypertonic on the R, pelvic diaphragm pulled into R hip with fascial drag  Results for orders placed or performed in visit on 07/06/17  CBC with Differential/Platelet  Result Value Ref Range   WBC 8.0 3.4 - 10.8 x10E3/uL   RBC 4.44 3.77 - 5.28 x10E6/uL   Hemoglobin 13.0 11.1 - 15.9 g/dL   Hematocrit 37.9 34.0 - 46.6 %   MCV 85 79 - 97 fL   MCH 29.3 26.6 - 33.0 pg   MCHC 34.3 31.5 - 35.7 g/dL   RDW 13.7 12.3 - 15.4 %   Platelets 251 150 - 379 x10E3/uL   Neutrophils 45 Not Estab. %   Lymphs 45 Not Estab. %   Monocytes 8 Not Estab. %   Eos 2 Not Estab. %   Basos 0 Not Estab. %   Neutrophils Absolute 3.6 1.4 - 7.0 x10E3/uL   Lymphocytes Absolute 3.6 (H) 0.7 - 3.1 x10E3/uL   Monocytes Absolute 0.6 0.1 - 0.9 x10E3/uL   EOS (ABSOLUTE) 0.1 0.0 - 0.4 x10E3/uL   Basophils Absolute 0.0 0.0 - 0.2 x10E3/uL   Immature Granulocytes 0 Not Estab. %   Immature Grans (Abs) 0.0 0.0 - 0.1 x10E3/uL  RA Qn+CCP(IgG/A)+SjoSSA+SjoSSB  Result Value Ref Range   Rhuematoid fact SerPl-aCnc 17.3 (H) 0.0 - 13.9 IU/mL   ENA SSA (RO) Ab <0.2 0.0 - 0.9 AI   ENA SSB (LA) Ab <0.2 0.0 - 0.9 AI   Cyclic Citrullin Peptide Ab 4 0 - 19 units  Comprehensive metabolic panel  Result Value Ref Range   Glucose 75 65 - 99 mg/dL   BUN 11 6 - 24 mg/dL   Creatinine, Ser 0.98 0.57 - 1.00 mg/dL   GFR calc non Af Amer 63 >59 mL/min/1.73   GFR calc Af Amer 73 >59 mL/min/1.73   BUN/Creatinine Ratio 11 9 - 23   Sodium 140 134 - 144 mmol/L   Potassium 4.3 3.5 - 5.2 mmol/L   Chloride 102 96 - 106 mmol/L   CO2 24 20 - 29 mmol/L   Calcium 9.6 8.7 - 10.2 mg/dL   Total Protein 7.3 6.0 - 8.5 g/dL   Albumin 4.5 3.5 - 5.5 g/dL   Globulin, Total 2.8 1.5 - 4.5 g/dL    Albumin/Globulin Ratio 1.6 1.2 - 2.2   Bilirubin Total 0.7 0.0 - 1.2 mg/dL   Alkaline Phosphatase 79 39 - 117 IU/L   AST 26 0 - 40 IU/L   ALT 33 (H) 0 - 32 IU/L  VITAMIN D 25 Hydroxy (Vit-D Deficiency, Fractures)  Result Value Ref Range   Vit D, 25-Hydroxy 31.9 30.0 - 100.0 ng/mL  Lyme Ab/Western Blot Reflex  Result Value Ref  Range   Lyme IgG/IgM Ab <0.91 0.00 - 0.90 ISR   LYME DISEASE AB, QUANT, IGM <0.80 0.00 - 0.79 index  Rocky mtn spotted fvr abs pnl(IgG+IgM)  Result Value Ref Range   RMSF IgG Negative Negative   RMSF IgM 0.27 0.00 - 9.21 index  Ehrlichia Antibody Panel  Result Value Ref Range   E.Chaffeensis (HME) IgG Negative Neg:<1:64   E. Chaffeensis (HME) IgM Titer Negative Neg:<1:20   HGE IgG Titer Negative Neg:<1:64   HGE IgM Titer Negative Neg:<1:20  Babesia microti Antibody Panel  Result Value Ref Range   Babesia microti IgM <1:10 Neg:<1:10   Babesia microti IgG <1:10 Neg:<1:10  Antinuclear Antib (ANA)  Result Value Ref Range   Anit Nuclear Antibody(ANA) Negative Negative  CK (Creatine Kinase)  Result Value Ref Range   Total CK 321 (H) 24 - 173 U/L      Assessment & Plan:   Problem List Items Addressed This Visit      Other   Low back pain    Seems to be at baseline today. Seems to be controlled well with periodic OMM treatments. This seems to be multifactorial being both myofascial and structural in nature. She does have some somatic dysfunction that I thing would benefit from OMT. Treated today with good results as below. Call with any concerns.       Thoracic outlet syndrome    Shoulder and neck pain seems to be related to hypertonic traps and pecs. Has not been stretching. Has issues with relaxing. Progressive relaxation discussed and demonstrated today. She does have some somatic dysfunction that I think is contributing to her symptoms. Treated today with good results as below. Call with any concerns. Recheck 1-2 months PRN.        Other Visit  Diagnoses    Arthralgia, unspecified joint    -  Primary   Discussed elevated RF and CK- will stop red yeast rice to see if that helps and will get her into see rheumatology for further evaluation. Call with concerns.    Lumbar region somatic dysfunction       Sacral region somatic dysfunction       Somatic dysfunction of pelvis region       Thoracic segment dysfunction       Somatic dysfunction of abdominal region       Rib cage region somatic dysfunction       Cervical somatic dysfunction       Head region somatic dysfunction         After verbal consent was obtained, patient was treated today with osteopathic manipulative medicine to the regions of the head, neck, thorax, ribs, lumbar, pelvis, sacrum and abdomen using the techniques of cranial, myofascial release, counterstrain, muscle energy, HVLA and soft tissue. Areas of compensation relating to her primary pain source also treated. Patient tolerated the procedure well with good objective and good subjective improvement in symptoms. She left the room in good condition. She was advised to stay well hydrated and that she may have some soreness following the procedure. If not improving or worsening, she will call and come in. Home exercise program of stretches for gluts discussed and demonstrated today. Patient will do these stretches BID to before the point of pain, and will return for reevaluation  in 1-2 months.   Follow up plan: Return 1-2 months , for Procedure visit.

## 2017-07-08 ENCOUNTER — Encounter: Payer: Self-pay | Admitting: Family Medicine

## 2017-07-08 LAB — RA QN+CCP(IGG/A)+SJOSSA+SJOSSB
CYCLIC CITRULLIN PEPTIDE AB: 4 U (ref 0–19)
ENA SSA (RO) Ab: <0.2 AI (ref 0.0–0.9)
ENA SSB (LA) Ab: <0.2 AI (ref 0.0–0.9)
RHEUMATOID FACTOR: 17.3 [IU]/mL — AB (ref 0.0–13.9)

## 2017-07-08 LAB — COMPREHENSIVE METABOLIC PANEL
ALK PHOS: 79 IU/L (ref 39–117)
ALT: 33 IU/L — AB (ref 0–32)
AST: 26 IU/L (ref 0–40)
Albumin/Globulin Ratio: 1.6 (ref 1.2–2.2)
Albumin: 4.5 g/dL (ref 3.5–5.5)
BILIRUBIN TOTAL: 0.7 mg/dL (ref 0.0–1.2)
BUN/Creatinine Ratio: 11 (ref 9–23)
BUN: 11 mg/dL (ref 6–24)
CO2: 24 mmol/L (ref 20–29)
CREATININE: 0.98 mg/dL (ref 0.57–1.00)
Calcium: 9.6 mg/dL (ref 8.7–10.2)
Chloride: 102 mmol/L (ref 96–106)
GFR calc Af Amer: 73 mL/min/{1.73_m2} (ref >59)
GFR calc non Af Amer: 63 mL/min/{1.73_m2} (ref >59)
GLUCOSE: 75 mg/dL (ref 65–99)
Globulin, Total: 2.8 g/dL (ref 1.5–4.5)
Potassium: 4.3 mmol/L (ref 3.5–5.2)
Sodium: 140 mmol/L (ref 134–144)
TOTAL PROTEIN: 7.3 g/dL (ref 6.0–8.5)

## 2017-07-08 LAB — CBC WITH DIFFERENTIAL/PLATELET
BASOS ABS: 0 10*3/uL (ref 0.0–0.2)
BASOS: 0 %
EOS (ABSOLUTE): 0.1 10*3/uL (ref 0.0–0.4)
EOS: 2 %
HEMATOCRIT: 37.9 % (ref 34.0–46.6)
Hemoglobin: 13 g/dL (ref 11.1–15.9)
IMMATURE GRANS (ABS): 0 10*3/uL (ref 0.0–0.1)
Immature Granulocytes: 0 %
LYMPHS ABS: 3.6 10*3/uL — AB (ref 0.7–3.1)
LYMPHS: 45 %
MCH: 29.3 pg (ref 26.6–33.0)
MCHC: 34.3 g/dL (ref 31.5–35.7)
MCV: 85 fL (ref 79–97)
Monocytes Absolute: 0.6 10*3/uL (ref 0.1–0.9)
Monocytes: 8 %
NEUTROS ABS: 3.6 10*3/uL (ref 1.4–7.0)
Neutrophils: 45 %
Platelets: 251 10*3/uL (ref 150–379)
RBC: 4.44 x10E6/uL (ref 3.77–5.28)
RDW: 13.7 % (ref 12.3–15.4)
WBC: 8 10*3/uL (ref 3.4–10.8)

## 2017-07-08 LAB — CK: Total CK: 321 U/L — ABNORMAL HIGH (ref 24–173)

## 2017-07-08 LAB — ROCKY MTN SPOTTED FVR ABS PNL(IGG+IGM)
RMSF IGG: NEGATIVE
RMSF IGM: 0.27 {index} (ref 0.00–0.89)

## 2017-07-08 LAB — ANA: ANA: NEGATIVE

## 2017-07-08 LAB — BABESIA MICROTI ANTIBODY PANEL: Babesia microti IgM: <1:10 {titer}

## 2017-07-08 LAB — EHRLICHIA ANTIBODY PANEL
E. Chaffeensis (HME) IgM Titer: NEGATIVE
E.Chaffeensis (HME) IgG: NEGATIVE
HGE IgG Titer: NEGATIVE
HGE IgM Titer: NEGATIVE

## 2017-07-08 LAB — LYME AB/WESTERN BLOT REFLEX: Lyme IgG/IgM Ab: <0.91 {ISR} (ref 0.00–0.90)

## 2017-07-08 LAB — VITAMIN D 25 HYDROXY (VIT D DEFICIENCY, FRACTURES): VIT D 25 HYDROXY: 31.9 ng/mL (ref 30.0–100.0)

## 2017-07-08 NOTE — Assessment & Plan Note (Signed)
Shoulder and neck pain seems to be related to hypertonic traps and pecs. Has not been stretching. Has issues with relaxing. Progressive relaxation discussed and demonstrated today. She does have some somatic dysfunction that I think is contributing to her symptoms. Treated today with good results as below. Call with any concerns. Recheck 1-2 months PRN.

## 2017-07-08 NOTE — Assessment & Plan Note (Signed)
Seems to be at baseline today. Seems to be controlled well with periodic OMM treatments. This seems to be multifactorial being both myofascial and structural in nature. She does have some somatic dysfunction that I thing would benefit from OMT. Treated today with good results as below. Call with any concerns.

## 2017-07-10 ENCOUNTER — Ambulatory Visit: Payer: BC Managed Care – PPO | Admitting: Family Medicine

## 2017-07-12 ENCOUNTER — Ambulatory Visit: Payer: BC Managed Care – PPO | Admitting: Family Medicine

## 2017-07-17 ENCOUNTER — Telehealth: Payer: Self-pay | Admitting: Family Medicine

## 2017-07-17 NOTE — Telephone Encounter (Signed)
Called and spoke to pharmacist. They did not have RX. It was phoned in.

## 2017-07-17 NOTE — Telephone Encounter (Signed)
Copied from [place] [id number]. Topic: Quick Communication - See Telephone Encounter >> Jul 17, 2017  9:13 AM [name] wrote: CRM for notification. See Telephone encounter for: 07/17/17.  Pt stating the pharmacy did not receive her clonazePAM (KLONOPIN) 0.5 MG tablet. She is going out of town on Wednesday.

## 2017-07-17 NOTE — Telephone Encounter (Signed)
Rx was supposed to be called in on 4/4- can we make sure it was or if it wasn't please call it in.

## 2017-07-25 ENCOUNTER — Ambulatory Visit: Payer: BC Managed Care – PPO | Admitting: Family Medicine

## 2017-07-25 ENCOUNTER — Other Ambulatory Visit: Payer: Self-pay

## 2017-07-25 ENCOUNTER — Encounter: Payer: Self-pay | Admitting: Family Medicine

## 2017-07-25 VITALS — BP 105/71 | HR 87 | Temp 98.5°F | Wt 206.2 lb

## 2017-07-25 DIAGNOSIS — F331 Major depressive disorder, recurrent, moderate: Secondary | ICD-10-CM

## 2017-07-25 DIAGNOSIS — M791 Myalgia, unspecified site: Secondary | ICD-10-CM | POA: Insufficient documentation

## 2017-07-25 DIAGNOSIS — Z789 Other specified health status: Secondary | ICD-10-CM | POA: Diagnosis not present

## 2017-07-25 MED ORDER — SPIRONOLACTONE 100 MG PO TABS: 100.0000 mg | ORAL_TABLET | Freq: Two times a day (BID) | ORAL | 1 refills | Status: DC

## 2017-07-25 MED ORDER — BUPROPION HCL ER (XL) 150 MG PO TB24: 150.0000 mg | ORAL_TABLET | Freq: Every day | ORAL | 1 refills | Status: DC

## 2017-07-25 NOTE — Assessment & Plan Note (Signed)
Stable on current regimen. Does not want to change dose right now. Will continue current regimen. Continue to monitor. Discussed setting boundaries and saying no to things- she will work on it. Call with any concerns.

## 2017-07-25 NOTE — Progress Notes (Signed)
BP 105/71 (BP Location: Left Arm, Patient Position: Sitting, Cuff Size: Normal)   Pulse 87   Temp 98.5 F (36.9 C)   Wt 206 lb 4 oz (93.6 kg)   LMP  (LMP Unknown)   SpO2 97%   BMI 35.40 kg/m    Subjective:    Patient ID: Tracie Sheppard, female    DOB: 12/04/1955, 62 y.o.   MRN: [id number]  HPI: Tracie Sheppard is a 62 y.o. female  Chief Complaint  Patient presents with  . Depression   DEPRESSION- daughter and grandson have moved back in. Still under a lot of stress with that.  Mood status: worse Satisfied with current treatment?: yes Symptom severity: moderate  Duration of current treatment : chronic Side effects: no Medication compliance: excellent compliance Psychotherapy/counseling: no  Previous psychiatric medications: wellbutrin, citalopram, clonazepam Depressed mood: yes Anxious mood: yes Anhedonia: no Significant weight loss or gain: no Insomnia: no  Fatigue: yes Feelings of worthlessness or guilt: yes Impaired concentration/indecisiveness: yes Suicidal ideations: no Hopelessness: yes Crying spells: yes Depression screen [facility] 2/9 07/25/2017 07/06/2017 04/25/2017 01/31/2017 01/03/2017  Decreased Interest 1 1 0 1 0  Down, Depressed, Hopeless 1 1 1 1 1   PHQ - 2 Score 2 2 1 2 1   Altered sleeping 3 3 2 3 3   Tired, decreased energy 2 2 2 1 2   Change in appetite 1 2 2 3 3   Feeling bad or failure about yourself  0 0 1 1 1   Trouble concentrating 0 0 0 1 1  Moving slowly or fidgety/restless 0 0 0 0 0  Suicidal thoughts 0 0 0 0 0  PHQ-9 Score 8 9 8 11 11   Difficult doing work/chores Somewhat difficult - Somewhat difficult Somewhat difficult Somewhat difficult   GAD 7 : Generalized Anxiety Score 07/25/2017 07/06/2017 04/25/2017 12/06/2016  Nervous, Anxious, on Edge 3 3 1 1   Control/stop worrying 2 2 1  0  Worry too much - different things 1 1 1  0  Trouble relaxing 0 1 1 1   Restless 0 0 0 0  Easily annoyed or irritable 2 1 1 2   Afraid - awful might happen 1 1 1  0  Total  GAD 7 Score 9 9 6 4   Anxiety Difficulty Somewhat difficult Somewhat difficult Somewhat difficult -   Has been off the red yeast rice for about 2 weeks. She is feeling a lot less achy off the medicine. No other concerns or complaints at this time.   Relevant past medical, surgical, family and social history reviewed and updated as indicated. Interim medical history since our last visit reviewed. Allergies and medications reviewed and updated.  Review of Systems  Constitutional: Negative.   Respiratory: Negative.   Cardiovascular: Negative.   Musculoskeletal: Positive for arthralgias and myalgias. Negative for back pain, gait problem, joint swelling, neck pain and neck stiffness.  Skin: Negative.   Neurological: Negative.   Psychiatric/Behavioral: Positive for dysphoric mood. Negative for agitation, behavioral problems, confusion, decreased concentration, hallucinations, self-injury, sleep disturbance and suicidal ideas. The patient is nervous/anxious. The patient is not hyperactive.     Per HPI unless specifically indicated above     Objective:    BP 105/71 (BP Location: Left Arm, Patient Position: Sitting, Cuff Size: Normal)   Pulse 87   Temp 98.5 F (36.9 C)   Wt 206 lb 4 oz (93.6 kg)   LMP  (LMP Unknown)   SpO2 97%   BMI 35.40 kg/m   Wt Readings from Last 3  Encounters:  07/25/17 206 lb 4 oz (93.6 kg)  07/07/17 208 lb 5 oz (94.5 kg)  07/06/17 208 lb 5 oz (94.5 kg)    Physical Exam  Constitutional: She is oriented to person, place, and time. She appears well-developed and well-nourished. No distress.  HENT:  Head: Normocephalic and atraumatic.  Right Ear: Hearing normal.  Left Ear: Hearing normal.  Nose: Nose normal.  Eyes: Conjunctivae and lids are normal. Right eye exhibits no discharge. Left eye exhibits no discharge. No scleral icterus.  Cardiovascular: Normal rate, regular rhythm, normal heart sounds and intact distal pulses. Exam reveals no gallop and no friction  rub.  No murmur heard. Pulmonary/Chest: Effort normal and breath sounds normal. No stridor. No respiratory distress. She has no wheezes. She has no rales. She exhibits no tenderness.  Musculoskeletal: Normal range of motion.  Neurological: She is alert and oriented to person, place, and time.  Skin: Skin is warm and intact. Capillary refill takes less than 2 seconds. No rash noted. She is not diaphoretic. No erythema. No pallor.  Psychiatric: Her speech is normal and behavior is normal. Judgment and thought content normal. Her mood appears anxious. Cognition and memory are normal. She exhibits a depressed mood.  Nursing note and vitals reviewed.   Results for orders placed or performed in visit on 07/06/17  CBC with Differential/Platelet  Result Value Ref Range   WBC 8.0 3.4 - 10.8 x10E3/uL   RBC 4.44 3.77 - 5.28 x10E6/uL   Hemoglobin 13.0 11.1 - 15.9 g/dL   Hematocrit 37.9 34.0 - 46.6 %   MCV 85 79 - 97 fL   MCH 29.3 26.6 - 33.0 pg   MCHC 34.3 31.5 - 35.7 g/dL   RDW 13.7 12.3 - 15.4 %   Platelets 251 150 - 379 x10E3/uL   Neutrophils 45 Not Estab. %   Lymphs 45 Not Estab. %   Monocytes 8 Not Estab. %   Eos 2 Not Estab. %   Basos 0 Not Estab. %   Neutrophils Absolute 3.6 1.4 - 7.0 x10E3/uL   Lymphocytes Absolute 3.6 (H) 0.7 - 3.1 x10E3/uL   Monocytes Absolute 0.6 0.1 - 0.9 x10E3/uL   EOS (ABSOLUTE) 0.1 0.0 - 0.4 x10E3/uL   Basophils Absolute 0.0 0.0 - 0.2 x10E3/uL   Immature Granulocytes 0 Not Estab. %   Immature Grans (Abs) 0.0 0.0 - 0.1 x10E3/uL  RA Qn+CCP(IgG/A)+SjoSSA+SjoSSB  Result Value Ref Range   Rhuematoid fact SerPl-aCnc 17.3 (H) 0.0 - 13.9 IU/mL   ENA SSA (RO) Ab <0.2 0.0 - 0.9 AI   ENA SSB (LA) Ab <0.2 0.0 - 0.9 AI   Cyclic Citrullin Peptide Ab 4 0 - 19 units  Comprehensive metabolic panel  Result Value Ref Range   Glucose 75 65 - 99 mg/dL   BUN 11 6 - 24 mg/dL   Creatinine, Ser 0.98 0.57 - 1.00 mg/dL   GFR calc non Af Amer 63 >59 mL/min/1.73   GFR calc Af  Amer 73 >59 mL/min/1.73   BUN/Creatinine Ratio 11 9 - 23   Sodium 140 134 - 144 mmol/L   Potassium 4.3 3.5 - 5.2 mmol/L   Chloride 102 96 - 106 mmol/L   CO2 24 20 - 29 mmol/L   Calcium 9.6 8.7 - 10.2 mg/dL   Total Protein 7.3 6.0 - 8.5 g/dL   Albumin 4.5 3.5 - 5.5 g/dL   Globulin, Total 2.8 1.5 - 4.5 g/dL   Albumin/Globulin Ratio 1.6 1.2 - 2.2   Bilirubin Total  0.7 0.0 - 1.2 mg/dL   Alkaline Phosphatase 79 39 - 117 IU/L   AST 26 0 - 40 IU/L   ALT 33 (H) 0 - 32 IU/L  VITAMIN D 25 Hydroxy (Vit-D Deficiency, Fractures)  Result Value Ref Range   Vit D, 25-Hydroxy 31.9 30.0 - 100.0 ng/mL  Lyme Ab/Western Blot Reflex  Result Value Ref Range   Lyme IgG/IgM Ab <0.91 0.00 - 0.90 ISR   LYME DISEASE AB, QUANT, IGM <0.80 0.00 - 0.79 index  Rocky mtn spotted fvr abs pnl(IgG+IgM)  Result Value Ref Range   RMSF IgG Negative Negative   RMSF IgM 0.27 0.00 - 1.49 index  Ehrlichia Antibody Panel  Result Value Ref Range   E.Chaffeensis (HME) IgG Negative Neg:<1:64   E. Chaffeensis (HME) IgM Titer Negative Neg:<1:20   HGE IgG Titer Negative Neg:<1:64   HGE IgM Titer Negative Neg:<1:20  Babesia microti Antibody Panel  Result Value Ref Range   Babesia microti IgM <1:10 Neg:<1:10   Babesia microti IgG <1:10 Neg:<1:10  Antinuclear Antib (ANA)  Result Value Ref Range   Anit Nuclear Antibody(ANA) Negative Negative  CK (Creatine Kinase)  Result Value Ref Range   Total CK 321 (H) 24 - 173 U/L      Assessment & Plan:   Problem List Items Addressed This Visit      Other   Depression, major, recurrent, moderate (HCC)    Stable on current regimen. Does not want to change dose right now. Will continue current regimen. Continue to monitor. Discussed setting boundaries and saying no to things- she will work on it. Call with any concerns.       Relevant Medications   buPROPion (WELLBUTRIN XL) 150 MG 24 hr tablet   Statin intolerance    Feeling much better off the red yeast rice. Will return in 1  month for follow up CK and recheck cholesterol. May need something like a zetia. Call with any concerns.           Follow up plan: Return in about 1 month (around 08/22/2017).

## 2017-07-25 NOTE — Assessment & Plan Note (Signed)
Feeling much better off the red yeast rice. Will return in 1 month for follow up CK and recheck cholesterol. May need something like a zetia. Call with any concerns.

## 2017-08-24 ENCOUNTER — Encounter: Payer: Self-pay | Admitting: Family Medicine

## 2017-08-24 ENCOUNTER — Ambulatory Visit: Payer: BC Managed Care – PPO | Admitting: Family Medicine

## 2017-08-24 VITALS — BP 127/75 | HR 77 | Temp 98.3°F | Wt 205.0 lb

## 2017-08-24 DIAGNOSIS — H9313 Tinnitus, bilateral: Secondary | ICD-10-CM | POA: Diagnosis not present

## 2017-08-24 DIAGNOSIS — M2550 Pain in unspecified joint: Secondary | ICD-10-CM | POA: Diagnosis not present

## 2017-08-24 DIAGNOSIS — F331 Major depressive disorder, recurrent, moderate: Secondary | ICD-10-CM | POA: Diagnosis not present

## 2017-08-24 NOTE — Assessment & Plan Note (Signed)
Does not want to change her medicine. Mood stable. Continue to monitor. Call with any concerns.

## 2017-08-24 NOTE — Progress Notes (Signed)
BP 127/75 (BP Location: Left Arm, Patient Position: Sitting, Cuff Size: Normal)   Pulse 77   Temp 98.3 F (36.8 C) (Oral)   Wt 205 lb (93 kg)   LMP  (LMP Unknown)   SpO2 100%   BMI 35.19 kg/m    Subjective:    Patient ID: Tracie Sheppard, female    DOB: June 21, 1955, 62 y.o.   MRN: [id number]  HPI: Tracie Sheppard is a 62 y.o. female  Chief Complaint  Patient presents with  . Generalized Body Aches  . Depression   DEPRESSION Mood status: uncontrolled Satisfied with current treatment?: yes Symptom severity: moderate  Duration of current treatment : chronic Side effects: no Medication compliance: excellent compliance Psychotherapy/counseling: no  Previous psychiatric medications:  Depressed mood: yes Anxious mood: yes Anhedonia: no Significant weight loss or gain: no Insomnia: no  Fatigue: yes Feelings of worthlessness or guilt: no Impaired concentration/indecisiveness: no Suicidal ideations: no Hopelessness: no Crying spells: no Depression screen [facility] 2/9 08/24/2017 07/25/2017 07/06/2017 04/25/2017 01/31/2017  Decreased Interest 1 1 1  0 1  Down, Depressed, Hopeless 1 1 1 1 1   PHQ - 2 Score 2 2 2 1 2   Altered sleeping 3 3 3 2 3   Tired, decreased energy 2 2 2 2 1   Change in appetite 2 1 2 2 3   Feeling bad or failure about yourself  1 0 0 1 1  Trouble concentrating 0 0 0 0 1  Moving slowly or fidgety/restless 0 0 0 0 0  Suicidal thoughts 0 0 0 0 0  PHQ-9 Score 10 8 9 8 11   Difficult doing work/chores Somewhat difficult Somewhat difficult - Somewhat difficult Somewhat difficult  Some recent data might be hidden   Having a wooshing in her ears when she first wakes up in the AM. Not every day. Doesn't last past the AM.   Still with a lot of pain in her buttock and legs. Has not seen PT yet. Has been doing OK with new shoes, but still not 100%  Relevant past medical, surgical, family and social history reviewed and updated as indicated. Interim medical history since our  last visit reviewed. Allergies and medications reviewed and updated.  Review of Systems  Constitutional: Negative.   Respiratory: Negative.   Cardiovascular: Negative.   Musculoskeletal: Positive for arthralgias and myalgias. Negative for back pain, gait problem, joint swelling, neck pain and neck stiffness.  Skin: Negative.   Neurological: Negative.   Psychiatric/Behavioral: Positive for dysphoric mood. Negative for agitation, behavioral problems, confusion, decreased concentration, hallucinations, self-injury, sleep disturbance and suicidal ideas. The patient is nervous/anxious. The patient is not hyperactive.     Per HPI unless specifically indicated above     Objective:    BP 127/75 (BP Location: Left Arm, Patient Position: Sitting, Cuff Size: Normal)   Pulse 77   Temp 98.3 F (36.8 C) (Oral)   Wt 205 lb (93 kg)   LMP  (LMP Unknown)   SpO2 100%   BMI 35.19 kg/m   Wt Readings from Last 3 Encounters:  08/24/17 205 lb (93 kg)  07/25/17 206 lb 4 oz (93.6 kg)  07/07/17 208 lb 5 oz (94.5 kg)    Physical Exam  Constitutional: She is oriented to person, place, and time. She appears well-developed and well-nourished. No distress.  HENT:  Head: Normocephalic and atraumatic.  Right Ear: Hearing and external ear normal.  Left Ear: Hearing and external ear normal.  Nose: Nose normal.  Mouth/Throat: Oropharynx is clear  and moist. No oropharyngeal exudate.  Eyes: Pupils are equal, round, and reactive to light. Conjunctivae, EOM and lids are normal. Right eye exhibits no discharge. Left eye exhibits no discharge. No scleral icterus.  Neck: Normal range of motion. Neck supple. No JVD present. No tracheal deviation present. No thyromegaly present.  Cardiovascular: Normal rate, regular rhythm, normal heart sounds and intact distal pulses. Exam reveals no gallop and no friction rub.  No murmur heard. Pulmonary/Chest: Effort normal and breath sounds normal. No stridor. No respiratory  distress. She has no wheezes. She has no rales. She exhibits no tenderness.  Musculoskeletal: Normal range of motion.  Lymphadenopathy:    She has no cervical adenopathy.  Neurological: She is alert and oriented to person, place, and time.  Skin: Skin is warm, dry and intact. Capillary refill takes less than 2 seconds. No rash noted. She is not diaphoretic. No erythema. No pallor.  Psychiatric: She has a normal mood and affect. Her speech is normal and behavior is normal. Judgment and thought content normal. Cognition and memory are normal.  Nursing note and vitals reviewed.   Results for orders placed or performed in visit on 07/06/17  CBC with Differential/Platelet  Result Value Ref Range   WBC 8.0 3.4 - 10.8 x10E3/uL   RBC 4.44 3.77 - 5.28 x10E6/uL   Hemoglobin 13.0 11.1 - 15.9 g/dL   Hematocrit 37.9 34.0 - 46.6 %   MCV 85 79 - 97 fL   MCH 29.3 26.6 - 33.0 pg   MCHC 34.3 31.5 - 35.7 g/dL   RDW 13.7 12.3 - 15.4 %   Platelets 251 150 - 379 x10E3/uL   Neutrophils 45 Not Estab. %   Lymphs 45 Not Estab. %   Monocytes 8 Not Estab. %   Eos 2 Not Estab. %   Basos 0 Not Estab. %   Neutrophils Absolute 3.6 1.4 - 7.0 x10E3/uL   Lymphocytes Absolute 3.6 (H) 0.7 - 3.1 x10E3/uL   Monocytes Absolute 0.6 0.1 - 0.9 x10E3/uL   EOS (ABSOLUTE) 0.1 0.0 - 0.4 x10E3/uL   Basophils Absolute 0.0 0.0 - 0.2 x10E3/uL   Immature Granulocytes 0 Not Estab. %   Immature Grans (Abs) 0.0 0.0 - 0.1 x10E3/uL  RA Qn+CCP(IgG/A)+SjoSSA+SjoSSB  Result Value Ref Range   Rhuematoid fact SerPl-aCnc 17.3 (H) 0.0 - 13.9 IU/mL   ENA SSA (RO) Ab <0.2 0.0 - 0.9 AI   ENA SSB (LA) Ab <0.2 0.0 - 0.9 AI   Cyclic Citrullin Peptide Ab 4 0 - 19 units  Comprehensive metabolic panel  Result Value Ref Range   Glucose 75 65 - 99 mg/dL   BUN 11 6 - 24 mg/dL   Creatinine, Ser 0.98 0.57 - 1.00 mg/dL   GFR calc non Af Amer 63 >59 mL/min/1.73   GFR calc Af Amer 73 >59 mL/min/1.73   BUN/Creatinine Ratio 11 9 - 23   Sodium 140 134  - 144 mmol/L   Potassium 4.3 3.5 - 5.2 mmol/L   Chloride 102 96 - 106 mmol/L   CO2 24 20 - 29 mmol/L   Calcium 9.6 8.7 - 10.2 mg/dL   Total Protein 7.3 6.0 - 8.5 g/dL   Albumin 4.5 3.5 - 5.5 g/dL   Globulin, Total 2.8 1.5 - 4.5 g/dL   Albumin/Globulin Ratio 1.6 1.2 - 2.2   Bilirubin Total 0.7 0.0 - 1.2 mg/dL   Alkaline Phosphatase 79 39 - 117 IU/L   AST 26 0 - 40 IU/L   ALT 33 (H)  0 - 32 IU/L  VITAMIN D 25 Hydroxy (Vit-D Deficiency, Fractures)  Result Value Ref Range   Vit D, 25-Hydroxy 31.9 30.0 - 100.0 ng/mL  Lyme Ab/Western Blot Reflex  Result Value Ref Range   Lyme IgG/IgM Ab <0.91 0.00 - 0.90 ISR   LYME DISEASE AB, QUANT, IGM <0.80 0.00 - 0.79 index  Rocky mtn spotted fvr abs pnl(IgG+IgM)  Result Value Ref Range   RMSF IgG Negative Negative   RMSF IgM 0.27 0.00 - 6.83 index  Ehrlichia Antibody Panel  Result Value Ref Range   E.Chaffeensis (HME) IgG Negative Neg:<1:64   E. Chaffeensis (HME) IgM Titer Negative Neg:<1:20   HGE IgG Titer Negative Neg:<1:64   HGE IgM Titer Negative Neg:<1:20  Babesia microti Antibody Panel  Result Value Ref Range   Babesia microti IgM <1:10 Neg:<1:10   Babesia microti IgG <1:10 Neg:<1:10  Antinuclear Antib (ANA)  Result Value Ref Range   Anit Nuclear Antibody(ANA) Negative Negative  CK (Creatine Kinase)  Result Value Ref Range   Total CK 321 (H) 24 - 173 U/L      Assessment & Plan:   Problem List Items Addressed This Visit      Other   Depression, major, recurrent, moderate ([place]) - Primary    Does not want to change her medicine. Mood stable. Continue to monitor. Call with any concerns.        Other Visit Diagnoses    Tinnitus of both ears       Discussed causes. Not happening all that often. Continue to monitor. Call with any concerns.    Arthralgia, unspecified joint       Has not followed up with PT yet. Encouraged follow up with PT. Call with any concerns.        Follow up plan: Return if symptoms worsen or fail to  improve, for Procedure visit.

## 2017-08-24 NOTE — Patient Instructions (Signed)
Tinnitus Tinnitus refers to hearing a sound when there is no actual source for that sound. This is often described as ringing in the ears. However, people with this condition may hear a variety of noises. A person may hear the sound in one ear or in both ears. The sounds of tinnitus can be soft, loud, or somewhere in between. Tinnitus can last for a few seconds or can be constant for days. It may go away without treatment and come back at various times. When tinnitus is constant or happens often, it can lead to other problems, such as trouble sleeping and trouble concentrating. Almost everyone experiences tinnitus at some point. Tinnitus that is long-lasting (chronic) or comes back often is a problem that may require medical attention. What are the causes? The cause of tinnitus is often not known. In some cases, it can result from other problems or conditions, including:  Exposure to loud noises from machinery, music, or other sources.  Hearing loss.  Ear or sinus infections.  Earwax buildup.  A foreign object in the ear.  Use of certain medicines.  Use of alcohol and caffeine.  High blood pressure.  Heart diseases.  Anemia.  Allergies.  Meniere disease.  Thyroid problems.  Tumors.  An enlarged part of a weakened blood vessel (aneurysm).  What are the signs or symptoms? The main symptom of tinnitus is hearing a sound when there is no source for that sound. It may sound like:  Buzzing.  Roaring.  Ringing.  Blowing air, similar to the sound heard when you listen to a seashell.  Hissing.  Whistling.  Sizzling.  Humming.  Running water.  A sustained musical note.  How is this diagnosed? Tinnitus is diagnosed based on your symptoms. Your health care provider will do a physical exam. A comprehensive hearing exam (audiologic exam) will be done if your tinnitus:  Affects only one ear (unilateral).  Causes hearing difficulties.  Lasts 6 months or  longer.  You may also need to see a health care provider who specializes in hearing disorders (audiologist). You may be asked to complete a questionnaire to determine the severity of your tinnitus. Tests may be done to help determine the cause and to rule out other conditions. These can include:  Imaging studies of your head and brain, such as: ? A CT scan. ? An MRI.  An imaging study of your blood vessels (angiogram).  How is this treated? Treating an underlying medical condition can sometimes make tinnitus go away. If your tinnitus continues, other treatments may include:  Medicines, such as certain antidepressants or sleeping aids.  Sound generators to mask the tinnitus. These include: ? Tabletop sound machines that play relaxing sounds to help you fall asleep. ? Wearable devices that fit in your ear and play sounds or music. ? A small device that uses headphones to deliver a signal embedded in music (acoustic neural stimulation). In time, this may change the pathways of your brain and make you less sensitive to tinnitus. This device is used for very severe cases when no other treatment is working.  Therapy and counseling to help you manage the stress of living with tinnitus.  Using hearing aids or cochlear implants, if your tinnitus is related to hearing loss.  Follow these instructions at home:  When possible, avoid being in loud places and being exposed to loud sounds.  Wear hearing protection, such as earplugs, when you are exposed to loud noises.  Do not take stimulants, such as nicotine,   alcohol, or caffeine.  Practice techniques for reducing stress, such as meditation, yoga, or deep breathing.  Use a white noise machine, a humidifier, or other devices to mask the sound of tinnitus.  Sleep with your head slightly raised. This may reduce the impact of tinnitus.  Try to get plenty of rest each night. Contact a health care provider if:  You have tinnitus in just one  ear.  Your tinnitus continues for 3 weeks or longer without stopping.  Home care measures are not helping.  You have tinnitus after a head injury.  You have tinnitus along with any of the following: ? Dizziness. ? Loss of balance. ? Nausea and vomiting. This information is not intended to replace advice given to you by your health care provider. Make sure you discuss any questions you have with your health care provider. Document Released: 03/21/2005 Document Revised: 11/22/2015 Document Reviewed: 08/21/2013 Elsevier Interactive Patient Education  2018 [employer]  

## 2017-11-13 ENCOUNTER — Other Ambulatory Visit: Payer: Self-pay | Admitting: Family Medicine

## 2017-11-29 ENCOUNTER — Telehealth: Payer: Self-pay | Admitting: Family Medicine

## 2017-11-29 NOTE — Telephone Encounter (Signed)
Needs appointment for physical with pap or records from GYN

## 2017-11-30 NOTE — Telephone Encounter (Signed)
Patient states she still has her physical at her GYN Dr Odella Aquas.  She is going to contact them to have them send Dr Wynetta Emery her last CPE done.

## 2018-02-09 ENCOUNTER — Other Ambulatory Visit: Payer: Self-pay | Admitting: Family Medicine

## 2018-02-09 MED ORDER — CLONAZEPAM 0.5 MG PO TABS: ORAL_TABLET | ORAL | 0 refills | Status: DC

## 2018-02-09 NOTE — Telephone Encounter (Signed)
Routing to provider to advise.  

## 2018-02-27 ENCOUNTER — Ambulatory Visit: Payer: BC Managed Care – PPO | Admitting: Family Medicine

## 2018-02-27 ENCOUNTER — Encounter: Payer: Self-pay | Admitting: Family Medicine

## 2018-02-27 VITALS — BP 130/84 | HR 92 | Temp 98.2°F | Wt 206.0 lb

## 2018-02-27 DIAGNOSIS — R319 Hematuria, unspecified: Secondary | ICD-10-CM

## 2018-02-27 DIAGNOSIS — Z23 Encounter for immunization: Secondary | ICD-10-CM | POA: Diagnosis not present

## 2018-02-27 DIAGNOSIS — I10 Essential (primary) hypertension: Secondary | ICD-10-CM

## 2018-02-27 DIAGNOSIS — R7301 Impaired fasting glucose: Secondary | ICD-10-CM

## 2018-02-27 DIAGNOSIS — F419 Anxiety disorder, unspecified: Secondary | ICD-10-CM

## 2018-02-27 DIAGNOSIS — E782 Mixed hyperlipidemia: Secondary | ICD-10-CM

## 2018-02-27 DIAGNOSIS — R0602 Shortness of breath: Secondary | ICD-10-CM | POA: Diagnosis not present

## 2018-02-27 DIAGNOSIS — E079 Disorder of thyroid, unspecified: Secondary | ICD-10-CM

## 2018-02-27 DIAGNOSIS — F331 Major depressive disorder, recurrent, moderate: Secondary | ICD-10-CM

## 2018-02-27 LAB — UA/M W/RFLX CULTURE, ROUTINE
BILIRUBIN UA: NEGATIVE
Glucose, UA: NEGATIVE
LEUKOCYTES UA: NEGATIVE
Nitrite, UA: NEGATIVE
PH UA: 5.5 (ref 5.0–7.5)
RBC, UA: NEGATIVE
Specific Gravity, UA: >1.03 — ABNORMAL HIGH (ref 1.005–1.030)
Urobilinogen, Ur: 1 mg/dL (ref 0.2–1.0)

## 2018-02-27 LAB — MICROSCOPIC EXAMINATION: Bacteria, UA: NONE SEEN

## 2018-02-27 LAB — BAYER DCA HB A1C WAIVED: HB A1C: 5.6 % (ref <7.0)

## 2018-02-27 LAB — MICROALBUMIN, URINE WAIVED
CREATININE, URINE WAIVED: 300 mg/dL (ref 10–300)
MICROALB, UR WAIVED: 80 mg/L — AB (ref 0–19)

## 2018-02-27 MED ORDER — BUPROPION HCL ER (XL) 150 MG PO TB24: 150.0000 mg | ORAL_TABLET | Freq: Every day | ORAL | 1 refills | Status: DC

## 2018-02-27 MED ORDER — SPIRONOLACTONE 100 MG PO TABS: 100.0000 mg | ORAL_TABLET | Freq: Two times a day (BID) | ORAL | 1 refills | Status: DC

## 2018-02-27 MED ORDER — FLUTICASONE PROPIONATE 50 MCG/ACT NA SUSP: NASAL | 12 refills | Status: DC

## 2018-02-27 MED ORDER — CITALOPRAM HYDROBROMIDE 40 MG PO TABS: 60.0000 mg | ORAL_TABLET | Freq: Every day | ORAL | 1 refills | Status: DC

## 2018-02-27 NOTE — Assessment & Plan Note (Signed)
Under good control on current regimen. Continue current regimen. Continue to monitor. Call with any concerns. Refills given. Labs checked today.  

## 2018-02-27 NOTE — Assessment & Plan Note (Signed)
Rechecking A1c today. Await results. Call with any concerns.

## 2018-02-27 NOTE — Assessment & Plan Note (Addendum)
Slightly exacerbated on current regimen. Continue current regimen. Continue to monitor. Call with any concerns. Refills given. Labs checked today.

## 2018-02-27 NOTE — Assessment & Plan Note (Signed)
Rechecking levels today. Await results. Treat as needed.  

## 2018-02-27 NOTE — Progress Notes (Signed)
BP 130/84   Pulse 92   Temp 98.2 F (36.8 C) (Oral)   Wt 206 lb (93.4 kg)   LMP  (LMP Unknown)   SpO2 98%   BMI 35.36 kg/m    Subjective:    Patient ID: Tracie Sheppard, female    DOB: January 10, 1956, 62 y.o.   MRN: [id number]  HPI: Tracie Sheppard is a 62 y.o. female  Chief Complaint  Patient presents with  . Shortness of Breath  . Hypertension  . Hyperlipidemia  . Hypothyroidism  . IFG   SHORTNESS OF BREATH Duration: couple of weeks Onset: gradual Description of breathing discomfort: short of breath- has been very limited in her exercise and activity Severity: moderate Episode duration:  Frequency: with any form of exertion Related to exertion: yes Cough: yes- feels like she's being strangled Chest tightness: yes Wheezing: no Fevers: no Chest pain: yes Palpitations: yes  Nausea: no Diaphoresis: yes Deconditioning: yes Status: worse  HYPERTENSION / HYPERLIPIDEMIA Satisfied with current treatment? yes Duration of hypertension: chronic BP monitoring frequency: not checking BP medication side effects: no Past BP meds: spironalactone Duration of hyperlipidemia: chronic Cholesterol medication side effects: yes- myalgias on statins Cholesterol supplements: none Medication compliance: excellent compliance Aspirin: no Recent stressors: yes Recurrent headaches: no Visual changes: no Palpitations: no Dyspnea: yes Chest pain: no Lower extremity edema: no Dizzy/lightheaded: no  HYPOTHYROIDISM Thyroid control status:controlled Satisfied with current treatment? yes Medication side effects: no Medication compliance: excellent compliance Recent dose adjustment:no Fatigue: no Cold intolerance: no Heat intolerance: no Weight gain: no Weight loss: no Constipation: no Diarrhea/loose stools: no Palpitations: no Lower extremity edema: no Anxiety/depressed mood: yes  ANXIETY/STRESS- has been caring for her 77yo aunt. This has been very stressful and she is still  trying to navigate this as she seems to be developing dementia. She is concerned about this and has been under a lot of stress, but continues to be SOB, even when anxiety is better.  Duration:stable Anxious mood: yes  Excessive worrying: yes Irritability: no  Sweating: no Nausea: no Palpitations:no Hyperventilation: no Panic attacks: yes Agoraphobia: no  Obscessions/compulsions: no Depressed mood: yes Depression screen [facility] 2/9 02/27/2018 08/24/2017 07/25/2017 07/06/2017 04/25/2017  Decreased Interest 0 1 1 1  0  Down, Depressed, Hopeless 1 1 1 1 1   PHQ - 2 Score 1 2 2 2 1   Altered sleeping 3 3 3 3 2   Tired, decreased energy 1 2 2 2 2   Change in appetite 1 2 1 2 2   Feeling bad or failure about yourself  0 1 0 0 1  Trouble concentrating 0 0 0 0 0  Moving slowly or fidgety/restless 0 0 0 0 0  Suicidal thoughts 0 0 0 0 0  PHQ-9 Score 6 10 8 9 8   Difficult doing work/chores Somewhat difficult Somewhat difficult Somewhat difficult - Somewhat difficult  Some recent data might be hidden   GAD 7 : Generalized Anxiety Score 02/27/2018 07/25/2017 07/06/2017 04/25/2017  Nervous, Anxious, on Edge 2 3 3 1   Control/stop worrying 1 2 2 1   Worry too much - different things 1 1 1 1   Trouble relaxing 0 0 1 1  Restless 0 0 0 0  Easily annoyed or irritable 0 2 1 1   Afraid - awful might happen 0 1 1 1   Total GAD 7 Score 4 9 9 6   Anxiety Difficulty Somewhat difficult Somewhat difficult Somewhat difficult Somewhat difficult   Anhedonia: no Weight changes: no Insomnia: no   Hypersomnia: no  Fatigue/loss of energy: yes Feelings of worthlessness: no Feelings of guilt: no Impaired concentration/indecisiveness: no Suicidal ideations: no  Crying spells: no Recent Stressors/Life Changes: yes   Relationship problems: no   Family stress: yes     Financial stress: no    Job stress: no    Recent death/loss: no  Impaired Fasting Glucose HbA1C:  Lab Results  Component Value Date   HGBA1C 5.6 11/02/2016     Duration of elevated blood sugar: chronic Polydipsia: no Polyuria: no Weight change: no Visual disturbance: no Glucose Monitoring: no   Relevant past medical, surgical, family and social history reviewed and updated as indicated. Interim medical history since our last visit reviewed. Allergies and medications reviewed and updated.  Review of Systems  Constitutional: Negative.   HENT: Negative.   Eyes: Negative.   Respiratory: Positive for shortness of breath. Negative for apnea, cough, choking, chest tightness, wheezing and stridor.   Cardiovascular: Negative.   Neurological: Negative.   Psychiatric/Behavioral: Negative.     Per HPI unless specifically indicated above     Objective:    BP 130/84   Pulse 92   Temp 98.2 F (36.8 C) (Oral)   Wt 206 lb (93.4 kg)   LMP  (LMP Unknown)   SpO2 98%   BMI 35.36 kg/m   Wt Readings from Last 3 Encounters:  02/27/18 206 lb (93.4 kg)  08/24/17 205 lb (93 kg)  07/25/17 206 lb 4 oz (93.6 kg)    Physical Exam  Constitutional: She is oriented to person, place, and time. She appears well-developed and well-nourished. No distress. She is not intubated.  HENT:  Head: Normocephalic and atraumatic.  Right Ear: Hearing normal.  Left Ear: Hearing normal.  Nose: Nose normal.  Mouth/Throat: Oropharynx is clear and moist. No oropharyngeal exudate or posterior oropharyngeal edema.  Eyes: Pupils are equal, round, and reactive to light. Conjunctivae, EOM and lids are normal. Right eye exhibits no discharge. Left eye exhibits no discharge. No scleral icterus.  Neck: Normal range of motion. Neck supple. No hepatojugular reflux and no JVD present. No tracheal deviation present. No thyromegaly present.  Cardiovascular: Normal rate, regular rhythm, normal heart sounds and intact distal pulses.  No extrasystoles are present. Exam reveals no gallop, no friction rub and no decreased pulses.  No murmur heard. Pulmonary/Chest: Effort normal and breath  sounds normal. No accessory muscle usage or stridor. No apnea, no tachypnea and no bradypnea. She is not intubated. No respiratory distress.  Musculoskeletal: Normal range of motion.  Lymphadenopathy:    She has no cervical adenopathy.  Neurological: She is alert and oriented to person, place, and time. She is not disoriented. No cranial nerve deficit.  Skin: Skin is warm, dry and intact. Capillary refill takes less than 2 seconds. No ecchymosis and no rash noted. She is not diaphoretic. No cyanosis or erythema. No pallor. Nails show no clubbing.  Psychiatric: Her speech is normal and behavior is normal. Judgment and thought content normal. Her mood appears anxious. Cognition and memory are normal.  Nursing note and vitals reviewed.   Results for orders placed or performed in visit on 07/06/17  CBC with Differential/Platelet  Result Value Ref Range   WBC 8.0 3.4 - 10.8 x10E3/uL   RBC 4.44 3.77 - 5.28 x10E6/uL   Hemoglobin 13.0 11.1 - 15.9 g/dL   Hematocrit 37.9 34.0 - 46.6 %   MCV 85 79 - 97 fL   MCH 29.3 26.6 - 33.0 pg   MCHC 34.3 31.5 -  35.7 g/dL   RDW 13.7 12.3 - 15.4 %   Platelets 251 150 - 379 x10E3/uL   Neutrophils 45 Not Estab. %   Lymphs 45 Not Estab. %   Monocytes 8 Not Estab. %   Eos 2 Not Estab. %   Basos 0 Not Estab. %   Neutrophils Absolute 3.6 1.4 - 7.0 x10E3/uL   Lymphocytes Absolute 3.6 (H) 0.7 - 3.1 x10E3/uL   Monocytes Absolute 0.6 0.1 - 0.9 x10E3/uL   EOS (ABSOLUTE) 0.1 0.0 - 0.4 x10E3/uL   Basophils Absolute 0.0 0.0 - 0.2 x10E3/uL   Immature Granulocytes 0 Not Estab. %   Immature Grans (Abs) 0.0 0.0 - 0.1 x10E3/uL  RA Qn+CCP(IgG/A)+SjoSSA+SjoSSB  Result Value Ref Range   Rhuematoid fact SerPl-aCnc 17.3 (H) 0.0 - 13.9 IU/mL   ENA SSA (RO) Ab <0.2 0.0 - 0.9 AI   ENA SSB (LA) Ab <0.2 0.0 - 0.9 AI   Cyclic Citrullin Peptide Ab 4 0 - 19 units  Comprehensive metabolic panel  Result Value Ref Range   Glucose 75 65 - 99 mg/dL   BUN 11 6 - 24 mg/dL   Creatinine,  Ser 0.98 0.57 - 1.00 mg/dL   GFR calc non Af Amer 63 >59 mL/min/1.73   GFR calc Af Amer 73 >59 mL/min/1.73   BUN/Creatinine Ratio 11 9 - 23   Sodium 140 134 - 144 mmol/L   Potassium 4.3 3.5 - 5.2 mmol/L   Chloride 102 96 - 106 mmol/L   CO2 24 20 - 29 mmol/L   Calcium 9.6 8.7 - 10.2 mg/dL   Total Protein 7.3 6.0 - 8.5 g/dL   Albumin 4.5 3.5 - 5.5 g/dL   Globulin, Total 2.8 1.5 - 4.5 g/dL   Albumin/Globulin Ratio 1.6 1.2 - 2.2   Bilirubin Total 0.7 0.0 - 1.2 mg/dL   Alkaline Phosphatase 79 39 - 117 IU/L   AST 26 0 - 40 IU/L   ALT 33 (H) 0 - 32 IU/L  VITAMIN D 25 Hydroxy (Vit-D Deficiency, Fractures)  Result Value Ref Range   Vit D, 25-Hydroxy 31.9 30.0 - 100.0 ng/mL  Lyme Ab/Western Blot Reflex  Result Value Ref Range   Lyme IgG/IgM Ab <0.91 0.00 - 0.90 ISR   LYME DISEASE AB, QUANT, IGM <0.80 0.00 - 0.79 index  Rocky mtn spotted fvr abs pnl(IgG+IgM)  Result Value Ref Range   RMSF IgG Negative Negative   RMSF IgM 0.27 0.00 - 5.03 index  Ehrlichia Antibody Panel  Result Value Ref Range   E.Chaffeensis (HME) IgG Negative Neg:<1:64   E. Chaffeensis (HME) IgM Titer Negative Neg:<1:20   HGE IgG Titer Negative Neg:<1:64   HGE IgM Titer Negative Neg:<1:20  Babesia microti Antibody Panel  Result Value Ref Range   Babesia microti IgM <1:10 Neg:<1:10   Babesia microti IgG <1:10 Neg:<1:10  Antinuclear Antib (ANA)  Result Value Ref Range   Anti Nuclear Antibody(ANA) Negative Negative  CK (Creatine Kinase)  Result Value Ref Range   Total CK 321 (H) 24 - 173 U/L      Assessment & Plan:   Problem List Items Addressed This Visit      Cardiovascular and Mediastinum   HTN (hypertension) - Primary    Under good control on current regimen. Continue current regimen. Continue to monitor. Call with any concerns. Refills given. Labs checked today.       Relevant Medications   spironolactone (ALDACTONE) 100 MG tablet   Other Relevant Orders   CBC with Differential/Platelet  Comprehensive metabolic panel   Microalbumin, Urine Waived   VITAMIN D 25 Hydroxy (Vit-D Deficiency, Fractures)     Endocrine   Thyroid disease    Rechecking levels today. Await results. Treat as needed.       Relevant Orders   CBC with Differential/Platelet   Comprehensive metabolic panel   TSH   VITAMIN D 25 Hydroxy (Vit-D Deficiency, Fractures)   IFG (impaired fasting glucose)    Rechecking A1c today. Await results. Call with any concerns.       Relevant Orders   Bayer DCA Hb A1c Waived   CBC with Differential/Platelet   Comprehensive metabolic panel   Microalbumin, Urine Waived   VITAMIN D 25 Hydroxy (Vit-D Deficiency, Fractures)     Other   Hematuria    Rechecking urine today. Await results. Call with any concerns.       Relevant Orders   CBC with Differential/Platelet   Comprehensive metabolic panel   UA/M w/rflx Culture, Routine   VITAMIN D 25 Hydroxy (Vit-D Deficiency, Fractures)   Acute anxiety    Slightly exacerbated on current regimen. Continue current regimen. Continue to monitor. Call with any concerns. Refills given. Labs checked today.      Relevant Medications   buPROPion (WELLBUTRIN XL) 150 MG 24 hr tablet   citalopram (CELEXA) 40 MG tablet   Other Relevant Orders   CBC with Differential/Platelet   Comprehensive metabolic panel   VITAMIN D 25 Hydroxy (Vit-D Deficiency, Fractures)   Hyperlipidemia    Rechecking levels today. Await results. Treat as needed.       Relevant Medications   spironolactone (ALDACTONE) 100 MG tablet   Other Relevant Orders   CBC with Differential/Platelet   Comprehensive metabolic panel   Lipid Panel w/o Chol/HDL Ratio   VITAMIN D 25 Hydroxy (Vit-D Deficiency, Fractures)   Depression, major, recurrent, moderate (HCC)    Under good control on current regimen. Continue current regimen. Continue to monitor. Call with any concerns. Refills given. Labs checked today.      Relevant Medications   buPROPion (WELLBUTRIN  XL) 150 MG 24 hr tablet   citalopram (CELEXA) 40 MG tablet   Other Relevant Orders   CBC with Differential/Platelet   Comprehensive metabolic panel   VITAMIN D 25 Hydroxy (Vit-D Deficiency, Fractures)    Other Visit Diagnoses    SOB (shortness of breath)       EKG NSR at 80bpm, but different from 1 year ago. Will get her into cardiology for evaluation. Husband sees [place]. Hopefully anxiety.   Relevant Orders   EKG 12-Lead (Completed)   Ambulatory referral to Cardiology   Needs flu shot       Flu shot given today.   Relevant Orders   Flu Vaccine QUAD 6+ mos PF IM (Fluarix Quad PF) (Completed)       Follow up plan: Return in about 4 weeks (around 03/27/2018).

## 2018-02-27 NOTE — Assessment & Plan Note (Signed)
Rechecking urine today. Await results. Call with any concerns.

## 2018-02-28 ENCOUNTER — Encounter: Payer: Self-pay | Admitting: Family Medicine

## 2018-02-28 LAB — CBC WITH DIFFERENTIAL/PLATELET
BASOS ABS: 0 10*3/uL (ref 0.0–0.2)
Basos: 1 %
EOS (ABSOLUTE): 0.2 10*3/uL (ref 0.0–0.4)
EOS: 2 %
HEMOGLOBIN: 13.1 g/dL (ref 11.1–15.9)
Hematocrit: 38.8 % (ref 34.0–46.6)
IMMATURE GRANS (ABS): 0 10*3/uL (ref 0.0–0.1)
Immature Granulocytes: 0 %
LYMPHS ABS: 4.3 10*3/uL — AB (ref 0.7–3.1)
Lymphs: 48 %
MCH: 29 pg (ref 26.6–33.0)
MCHC: 33.8 g/dL (ref 31.5–35.7)
MCV: 86 fL (ref 79–97)
Monocytes Absolute: 0.7 10*3/uL (ref 0.1–0.9)
Monocytes: 9 %
NEUTROS PCT: 40 %
Neutrophils Absolute: 3.4 10*3/uL (ref 1.4–7.0)
Platelets: 282 10*3/uL (ref 150–450)
RBC: 4.51 x10E6/uL (ref 3.77–5.28)
RDW: 12.4 % (ref 12.3–15.4)
WBC: 8.6 10*3/uL (ref 3.4–10.8)

## 2018-02-28 LAB — COMPREHENSIVE METABOLIC PANEL
A/G RATIO: 1.7 (ref 1.2–2.2)
ALBUMIN: 4.5 g/dL (ref 3.6–4.8)
ALK PHOS: 80 IU/L (ref 39–117)
ALT: 40 IU/L — ABNORMAL HIGH (ref 0–32)
AST: 31 IU/L (ref 0–40)
BUN / CREAT RATIO: 16 (ref 12–28)
BUN: 15 mg/dL (ref 8–27)
Bilirubin Total: 0.8 mg/dL (ref 0.0–1.2)
CHLORIDE: 104 mmol/L (ref 96–106)
CO2: 23 mmol/L (ref 20–29)
Calcium: 9.6 mg/dL (ref 8.7–10.3)
Creatinine, Ser: 0.95 mg/dL (ref 0.57–1.00)
GFR calc non Af Amer: 65 mL/min/{1.73_m2} (ref >59)
GFR, EST AFRICAN AMERICAN: 75 mL/min/{1.73_m2} (ref >59)
Globulin, Total: 2.6 g/dL (ref 1.5–4.5)
Glucose: 90 mg/dL (ref 65–99)
POTASSIUM: 3.9 mmol/L (ref 3.5–5.2)
SODIUM: 142 mmol/L (ref 134–144)
TOTAL PROTEIN: 7.1 g/dL (ref 6.0–8.5)

## 2018-02-28 LAB — TSH: TSH: 0.983 u[IU]/mL (ref 0.450–4.500)

## 2018-02-28 LAB — LIPID PANEL W/O CHOL/HDL RATIO
Cholesterol, Total: 220 mg/dL — ABNORMAL HIGH (ref 100–199)
HDL: 49 mg/dL (ref >39)
LDL Calculated: 149 mg/dL — ABNORMAL HIGH (ref 0–99)
Triglycerides: 111 mg/dL (ref 0–149)
VLDL Cholesterol Cal: 22 mg/dL (ref 5–40)

## 2018-02-28 LAB — VITAMIN D 25 HYDROXY (VIT D DEFICIENCY, FRACTURES): Vit D, 25-Hydroxy: 26.2 ng/mL — ABNORMAL LOW (ref 30.0–100.0)

## 2018-03-02 ENCOUNTER — Ambulatory Visit: Payer: BC Managed Care – PPO | Admitting: Family Medicine

## 2018-03-09 ENCOUNTER — Other Ambulatory Visit: Payer: Self-pay | Admitting: Family Medicine

## 2018-03-09 MED ORDER — CLONAZEPAM 0.5 MG PO TABS: ORAL_TABLET | ORAL | 0 refills | Status: DC

## 2018-03-09 NOTE — Telephone Encounter (Signed)
Never mind- Rx never made it to the pharmacy last visit. Refilled.

## 2018-03-09 NOTE — Telephone Encounter (Signed)
Please find out if she is actually out. 30 pills usually lasts her about 6 months per last fill and if she's needing it daily, we will need to adjust her other medicines.

## 2018-03-09 NOTE — Telephone Encounter (Signed)
Pt called and states that this medication is not at the pharmacy for her.  Pt wants to know if it can be resent.

## 2018-03-09 NOTE — Telephone Encounter (Signed)
Requested medication (s) are due for refill today: Yes  Requested medication (s) are on the active medication list: Yes  Last refill:  02/09/18  Future visit scheduled: Yes  Notes to clinic:  Unable to refill, patient asks for this to be resent, she says it is not at the pharmacy.     Requested Prescriptions  Pending Prescriptions Disp Refills   clonazePAM (KLONOPIN) 0.5 MG tablet 30 tablet 0    Sig: TAKE 1 TABLET BY MOUTH DAILY AS NEEDED FOR ANIXETY     Not Delegated - Psychiatry:  Anxiolytics/Hypnotics Failed - 03/09/2018  3:41 PM      Failed - This refill cannot be delegated      Failed - Urine Drug Screen completed in last 360 days.      Passed - Valid encounter within last 6 months    Recent Outpatient Visits          1 week ago Essential hypertension   [place], Megan P, DO   6 months ago Depression, major, recurrent, moderate ([place])   [place], Megan P, DO   7 months ago Depression, major, recurrent, moderate ([place])   [place], Megan P, DO   8 months ago Arthralgia, unspecified joint   [place], Megan P, DO   8 months ago Dysfunction of both eustachian tubes   [facility] [place], Barb Merino, DO      Future Appointments            In 3 weeks [place], Barb Merino, DO [facility]   In 1 month [place], Megan P, DO [facility]         Signed Prescriptions Disp Refills   clonazePAM (KLONOPIN) 0.5 MG tablet 30 tablet 0    Sig: TAKE 1 TABLET BY MOUTH DAILY AS NEEDED FOR ANIXETY     Not Delegated - Psychiatry:  Anxiolytics/Hypnotics Failed - 02/09/2018  2:38 PM      Failed - This refill cannot be delegated      Failed - Urine Drug Screen completed in last 360 days.      Passed - Valid encounter within last 6 months    Recent Outpatient Visits          1 week ago Essential hypertension   [place], Megan  P, DO   6 months ago Depression, major, recurrent, moderate ([place])   [place], Megan P, DO   7 months ago Depression, major, recurrent, moderate ([place])   [place], Megan P, DO   8 months ago Arthralgia, unspecified joint   [place], Megan P, DO   8 months ago Dysfunction of both eustachian tubes   [place], Barb Merino, DO      Future Appointments            In 3 weeks Wynetta Emery, Barb Merino, DO [facility] [place]   In 1 month [place], Barb Merino, DO [facility]

## 2018-03-09 NOTE — Addendum Note (Signed)
Addended by: Matilde Sprang on: 03/09/2018 03:41 PM   Modules accepted: Orders

## 2018-04-04 NOTE — Progress Notes (Signed)
BP 118/70 (BP Location: Left Arm, Patient Position: Sitting, Cuff Size: Normal)   Pulse 79   Temp 97.9 F (36.6 C) (Oral)   Ht 5\' 4"  (1.626 m)   Wt 202 lb 11.2 oz (91.9 kg)   LMP  (LMP Unknown)   SpO2 99%   BMI 34.79 kg/m    Subjective:    Patient ID: Tracie Sheppard, female    DOB: February 11, 1956, 63 y.o.   MRN: [id number]  HPI: Tracie Sheppard is a 63 y.o. female  Chief Complaint  Patient presents with  . Anxiety  . Depression   ANXIETY/STRESS- didn't plan for the holidays and stressed herself out and had been taking her clonazepam daily. Duration:exacerbated Anxious mood: yes  Excessive worrying: yes Irritability: yes  Sweating: no Nausea: no Palpitations:no Hyperventilation: no Panic attacks: yes Agoraphobia: no  Obscessions/compulsions: no Depressed mood: yes Depression screen [facility] 2/9 04/05/2018 02/27/2018 08/24/2017 07/25/2017 07/06/2017  Decreased Interest 2 0 1 1 1   Down, Depressed, Hopeless 1 1 1 1 1   PHQ - 2 Score 3 1 2 2 2   Altered sleeping 3 3 3 3 3   Tired, decreased energy 2 1 2 2 2   Change in appetite 3 1 2 1 2   Feeling bad or failure about yourself  1 0 1 0 0  Trouble concentrating 0 0 0 0 0  Moving slowly or fidgety/restless 0 0 0 0 0  Suicidal thoughts 0 0 0 0 0  PHQ-9 Score 12 6 10 8 9   Difficult doing work/chores Very difficult Somewhat difficult Somewhat difficult Somewhat difficult -  Some recent data might be hidden   GAD 7 : Generalized Anxiety Score 04/05/2018 02/27/2018 07/25/2017 07/06/2017  Nervous, Anxious, on Edge 3 2 3 3   Control/stop worrying 1 1 2 2   Worry too much - different things 1 1 1 1   Trouble relaxing 1 0 0 1  Restless 0 0 0 0  Easily annoyed or irritable 1 0 2 1  Afraid - awful might happen 1 0 1 1  Total GAD 7 Score 8 4 9 9   Anxiety Difficulty Somewhat difficult Somewhat difficult Somewhat difficult Somewhat difficult   Anhedonia: no Weight changes: no Insomnia: yes   Hypersomnia: no Fatigue/loss of energy: yes Feelings of  worthlessness: no Feelings of guilt: no Impaired concentration/indecisiveness: no Suicidal ideations: no  Crying spells: no Recent Stressors/Life Changes: yes   Relationship problems: no   Family stress: yes     Financial stress: no    Job stress: no    Recent death/loss: no  Relevant past medical, surgical, family and social history reviewed and updated as indicated. Interim medical history since our last visit reviewed. Allergies and medications reviewed and updated.  Review of Systems  Constitutional: Negative.   Respiratory: Negative.   Cardiovascular: Negative.   Psychiatric/Behavioral: Negative.     Per HPI unless specifically indicated above     Objective:    BP 118/70 (BP Location: Left Arm, Patient Position: Sitting, Cuff Size: Normal)   Pulse 79   Temp 97.9 F (36.6 C) (Oral)   Ht 5\' 4"  (1.626 m)   Wt 202 lb 11.2 oz (91.9 kg)   LMP  (LMP Unknown)   SpO2 99%   BMI 34.79 kg/m   Wt Readings from Last 3 Encounters:  04/05/18 202 lb 11.2 oz (91.9 kg)  02/27/18 206 lb (93.4 kg)  08/24/17 205 lb (93 kg)    Physical Exam Vitals signs and nursing note reviewed.  Constitutional:      General: She is not in acute distress.    Appearance: Normal appearance. She is not ill-appearing, toxic-appearing or diaphoretic.  HENT:     Head: Normocephalic and atraumatic.     Right Ear: External ear normal.     Left Ear: External ear normal.     Nose: Nose normal.     Mouth/Throat:     Mouth: Mucous membranes are moist.     Pharynx: Oropharynx is clear.  Eyes:     General: No scleral icterus.       Right eye: No discharge.        Left eye: No discharge.     Extraocular Movements: Extraocular movements intact.     Conjunctiva/sclera: Conjunctivae normal.     Pupils: Pupils are equal, round, and reactive to light.  Neck:     Musculoskeletal: Normal range of motion and neck supple.  Cardiovascular:     Rate and Rhythm: Normal rate and regular rhythm.     Pulses:  Normal pulses.     Heart sounds: Normal heart sounds. No murmur. No friction rub. No gallop.   Pulmonary:     Effort: Pulmonary effort is normal. No respiratory distress.     Breath sounds: Normal breath sounds. No stridor. No wheezing, rhonchi or rales.  Chest:     Chest wall: No tenderness.  Musculoskeletal: Normal range of motion.  Skin:    General: Skin is warm and dry.     Capillary Refill: Capillary refill takes less than 2 seconds.     Coloration: Skin is not jaundiced or pale.     Findings: No bruising, erythema, lesion or rash.  Neurological:     General: No focal deficit present.     Mental Status: She is alert and oriented to person, place, and time. Mental status is at baseline.  Psychiatric:        Mood and Affect: Mood normal.        Behavior: Behavior normal.        Thought Content: Thought content normal.        Judgment: Judgment normal.     Results for orders placed or performed in visit on 02/27/18  Microscopic Examination  Result Value Ref Range   WBC, UA 0-5 0 - 5 /hpf   RBC, UA 0-2 0 - 2 /hpf   Epithelial Cells (non renal) 0-10 0 - 10 /hpf   Mucus, UA Present Not Estab.   Bacteria, UA None seen None seen/Few  Bayer DCA Hb A1c Waived  Result Value Ref Range   HB A1C (BAYER DCA - WAIVED) 5.6 <7.0 %  CBC with Differential/Platelet  Result Value Ref Range   WBC 8.6 3.4 - 10.8 x10E3/uL   RBC 4.51 3.77 - 5.28 x10E6/uL   Hemoglobin 13.1 11.1 - 15.9 g/dL   Hematocrit 38.8 34.0 - 46.6 %   MCV 86 79 - 97 fL   MCH 29.0 26.6 - 33.0 pg   MCHC 33.8 31.5 - 35.7 g/dL   RDW 12.4 12.3 - 15.4 %   Platelets 282 150 - 450 x10E3/uL   Neutrophils 40 Not Estab. %   Lymphs 48 Not Estab. %   Monocytes 9 Not Estab. %   Eos 2 Not Estab. %   Basos 1 Not Estab. %   Neutrophils Absolute 3.4 1.4 - 7.0 x10E3/uL   Lymphocytes Absolute 4.3 (H) 0.7 - 3.1 x10E3/uL   Monocytes Absolute 0.7 0.1 - 0.9 x10E3/uL  EOS (ABSOLUTE) 0.2 0.0 - 0.4 x10E3/uL   Basophils Absolute 0.0 0.0 -  0.2 x10E3/uL   Immature Granulocytes 0 Not Estab. %   Immature Grans (Abs) 0.0 0.0 - 0.1 x10E3/uL  Comprehensive metabolic panel  Result Value Ref Range   Glucose 90 65 - 99 mg/dL   BUN 15 8 - 27 mg/dL   Creatinine, Ser 0.95 0.57 - 1.00 mg/dL   GFR calc non Af Amer 65 >59 mL/min/1.73   GFR calc Af Amer 75 >59 mL/min/1.73   BUN/Creatinine Ratio 16 12 - 28   Sodium 142 134 - 144 mmol/L   Potassium 3.9 3.5 - 5.2 mmol/L   Chloride 104 96 - 106 mmol/L   CO2 23 20 - 29 mmol/L   Calcium 9.6 8.7 - 10.3 mg/dL   Total Protein 7.1 6.0 - 8.5 g/dL   Albumin 4.5 3.6 - 4.8 g/dL   Globulin, Total 2.6 1.5 - 4.5 g/dL   Albumin/Globulin Ratio 1.7 1.2 - 2.2   Bilirubin Total 0.8 0.0 - 1.2 mg/dL   Alkaline Phosphatase 80 39 - 117 IU/L   AST 31 0 - 40 IU/L   ALT 40 (H) 0 - 32 IU/L  Lipid Panel w/o Chol/HDL Ratio  Result Value Ref Range   Cholesterol, Total 220 (H) 100 - 199 mg/dL   Triglycerides 111 0 - 149 mg/dL   HDL 49 >39 mg/dL   VLDL Cholesterol Cal 22 5 - 40 mg/dL   LDL Calculated 149 (H) 0 - 99 mg/dL  Microalbumin, Urine Waived  Result Value Ref Range   Microalb, Ur Waived 80 (H) 0 - 19 mg/L   Creatinine, Urine Waived 300 10 - 300 mg/dL   Microalb/Creat Ratio 30-300 (H) <30 mg/g  TSH  Result Value Ref Range   TSH 0.983 0.450 - 4.500 uIU/mL  UA/M w/rflx Culture, Routine  Result Value Ref Range   Specific Gravity, UA >1.030 (H) 1.005 - 1.030   pH, UA 5.5 5.0 - 7.5   Color, UA Yellow Yellow   Appearance Ur Hazy (A) Clear   Leukocytes, UA Negative Negative   Protein, UA 1+ (A) Negative/Trace   Glucose, UA Negative Negative   Ketones, UA 1+ (A) Negative   RBC, UA Negative Negative   Bilirubin, UA Negative Negative   Urobilinogen, Ur 1.0 0.2 - 1.0 mg/dL   Nitrite, UA Negative Negative   Microscopic Examination See below:   VITAMIN D 25 Hydroxy (Vit-D Deficiency, Fractures)  Result Value Ref Range   Vit D, 25-Hydroxy 26.2 (L) 30.0 - 100.0 ng/mL      Assessment & Plan:   Problem  List Items Addressed This Visit      Other   Acute anxiety - Primary    Not doing particularly well- will increase her wellbutrin to 300mg  daily and recheck 1 month. Call with any concerns. Refill of clonazepam given today.      Relevant Medications   buPROPion (WELLBUTRIN XL) 300 MG 24 hr tablet   Depression, major, recurrent, moderate (HCC)    Not doing particularly well- will increase her wellbutrin to 300mg  daily and recheck 1 month. Call with any concerns.       Relevant Medications   buPROPion (WELLBUTRIN XL) 300 MG 24 hr tablet       Follow up plan: Return in about 4 weeks (around 05/03/2018).

## 2018-04-05 ENCOUNTER — Encounter

## 2018-04-05 ENCOUNTER — Ambulatory Visit: Payer: BC Managed Care – PPO | Admitting: Family Medicine

## 2018-04-05 ENCOUNTER — Encounter: Payer: Self-pay | Admitting: Family Medicine

## 2018-04-05 VITALS — BP 118/70 | HR 79 | Temp 97.9°F | Ht 64.0 in | Wt 202.7 lb

## 2018-04-05 DIAGNOSIS — F331 Major depressive disorder, recurrent, moderate: Secondary | ICD-10-CM

## 2018-04-05 DIAGNOSIS — F419 Anxiety disorder, unspecified: Secondary | ICD-10-CM

## 2018-04-05 MED ORDER — CLONAZEPAM 0.5 MG PO TABS: ORAL_TABLET | ORAL | 0 refills | Status: DC

## 2018-04-05 MED ORDER — BUPROPION HCL ER (XL) 300 MG PO TB24: 300.0000 mg | ORAL_TABLET | Freq: Every day | ORAL | 1 refills | Status: DC

## 2018-04-05 NOTE — Assessment & Plan Note (Signed)
Not doing particularly well- will increase her wellbutrin to 300mg  daily and recheck 1 month. Call with any concerns.

## 2018-04-05 NOTE — Assessment & Plan Note (Signed)
Not doing particularly well- will increase her wellbutrin to 300mg  daily and recheck 1 month. Call with any concerns. Refill of clonazepam given today.

## 2018-04-09 ENCOUNTER — Ambulatory Visit: Payer: BC Managed Care – PPO | Admitting: Family Medicine

## 2018-04-10 ENCOUNTER — Ambulatory Visit: Payer: BC Managed Care – PPO | Admitting: Family Medicine

## 2018-04-10 ENCOUNTER — Encounter: Payer: Self-pay | Admitting: Family Medicine

## 2018-04-10 ENCOUNTER — Encounter

## 2018-04-10 VITALS — BP 144/82 | HR 81 | Temp 98.0°F | Ht 64.0 in | Wt 203.0 lb

## 2018-04-10 DIAGNOSIS — M9900 Segmental and somatic dysfunction of head region: Secondary | ICD-10-CM | POA: Diagnosis not present

## 2018-04-10 DIAGNOSIS — M542 Cervicalgia: Secondary | ICD-10-CM

## 2018-04-10 DIAGNOSIS — M9908 Segmental and somatic dysfunction of rib cage: Secondary | ICD-10-CM

## 2018-04-10 DIAGNOSIS — M9905 Segmental and somatic dysfunction of pelvic region: Secondary | ICD-10-CM | POA: Diagnosis not present

## 2018-04-10 DIAGNOSIS — M9902 Segmental and somatic dysfunction of thoracic region: Secondary | ICD-10-CM

## 2018-04-10 DIAGNOSIS — M9904 Segmental and somatic dysfunction of sacral region: Secondary | ICD-10-CM

## 2018-04-10 DIAGNOSIS — M9901 Segmental and somatic dysfunction of cervical region: Secondary | ICD-10-CM

## 2018-04-10 DIAGNOSIS — M9906 Segmental and somatic dysfunction of lower extremity: Secondary | ICD-10-CM

## 2018-04-10 DIAGNOSIS — M9909 Segmental and somatic dysfunction of abdomen and other regions: Secondary | ICD-10-CM

## 2018-04-10 NOTE — Progress Notes (Signed)
BP (!) 144/82   Pulse 81   Temp 98 F (36.7 C) (Oral)   Ht 5\' 4"  (1.626 m)   Wt 203 lb (92.1 kg)   LMP  (LMP Unknown)   SpO2 97%   BMI 34.84 kg/m    Subjective:    Patient ID: Tracie Sheppard, female    DOB: 15-Oct-1955, 63 y.o.   MRN: [id number]  HPI: Tracie Sheppard is a 63 y.o. female  Chief Complaint  Patient presents with  . Neck Pain   Annalaura notes that she has been doing OK. She presents today for evaluation and possible treatment with OMT for her neck and back pain. She notes that her back is not too bad, has been feeling OK with occasional twinges when she sits too long or does a lot of extra work. She notes that her neck has been bothering her. Neck has been acting up for about a day. It's feeling tight on the L side it radiates into her head and into her L shoulder. She notes that it has been getting better since last night. She notes that her pain is better with stretching or heat, worse with stress. She is otherwise doing well with no other concerns or complaints at this time.    Relevant past medical, surgical, family and social history reviewed and updated as indicated. Interim medical history since our last visit reviewed. Allergies and medications reviewed and updated.  Review of Systems  Constitutional: Negative.   Respiratory: Negative.   Cardiovascular: Negative.   Musculoskeletal: Positive for myalgias, neck pain and neck stiffness. Negative for arthralgias, back pain, gait problem and joint swelling.  Skin: Negative.   Neurological: Negative.   Psychiatric/Behavioral: Negative.     Per HPI unless specifically indicated above     Objective:    BP (!) 144/82   Pulse 81   Temp 98 F (36.7 C) (Oral)   Ht 5\' 4"  (1.626 m)   Wt 203 lb (92.1 kg)   LMP  (LMP Unknown)   SpO2 97%   BMI 34.84 kg/m   Wt Readings from Last 3 Encounters:  04/10/18 203 lb (92.1 kg)  04/05/18 202 lb 11.2 oz (91.9 kg)  02/27/18 206 lb (93.4 kg)    Physical Exam Vitals  signs and nursing note reviewed.  Constitutional:      General: She is not in acute distress.    Appearance: Normal appearance. She is not ill-appearing.  HENT:     Head: Normocephalic and atraumatic.     Right Ear: External ear normal.     Left Ear: External ear normal.     Nose: Nose normal.     Mouth/Throat:     Mouth: Mucous membranes are moist.     Pharynx: Oropharynx is clear.  Eyes:     Extraocular Movements: Extraocular movements intact.     Conjunctiva/sclera: Conjunctivae normal.     Pupils: Pupils are equal, round, and reactive to light.  Neck:     Musculoskeletal: No muscular tenderness.     Vascular: No carotid bruit.  Cardiovascular:     Rate and Rhythm: Normal rate.     Pulses: Normal pulses.  Pulmonary:     Effort: Pulmonary effort is normal. No respiratory distress.  Abdominal:     General: Abdomen is flat. There is no distension.     Palpations: Abdomen is soft. There is no mass.     Tenderness: There is no abdominal tenderness. There is no right CVA  tenderness, left CVA tenderness, guarding or rebound.     Hernia: No hernia is present.  Lymphadenopathy:     Cervical: No cervical adenopathy.  Skin:    General: Skin is warm and dry.     Capillary Refill: Capillary refill takes less than 2 seconds.     Coloration: Skin is not jaundiced or pale.     Findings: No bruising, erythema, lesion or rash.  Neurological:     General: No focal deficit present.     Mental Status: She is alert. Mental status is at baseline.  Psychiatric:        Mood and Affect: Mood normal.        Behavior: Behavior normal.        Thought Content: Thought content normal.        Judgment: Judgment normal.   Musculoskeletal:  Exam found Decreased ROM, Tissue texture changes, Tenderness to palpation and Asymmetry of patient's  head, neck, thorax, ribs, pelvis, sacrum, lower extremity and abdomen Osteopathic Structural Exam:   Head: OAESSL, OM suture restricted bilaterally, hypertonic  suboccipital muscles  Neck: C4ESRR, C5ESRL  Thorax: T3-5SLRR, trap spasm on the R  Ribs: Ribs 5-6 locked up on the R  Pelvis: Posterior R innominate  Sacrum: R on R torsion  Lower Extremity: IT band hypertonic on the R  Abdomen: diaphragm spasm bilaterally R>L  Results for orders placed or performed in visit on 02/27/18  Microscopic Examination  Result Value Ref Range   WBC, UA 0-5 0 - 5 /hpf   RBC, UA 0-2 0 - 2 /hpf   Epithelial Cells (non renal) 0-10 0 - 10 /hpf   Mucus, UA Present Not Estab.   Bacteria, UA None seen None seen/Few  Bayer DCA Hb A1c Waived  Result Value Ref Range   HB A1C (BAYER DCA - WAIVED) 5.6 <7.0 %  CBC with Differential/Platelet  Result Value Ref Range   WBC 8.6 3.4 - 10.8 x10E3/uL   RBC 4.51 3.77 - 5.28 x10E6/uL   Hemoglobin 13.1 11.1 - 15.9 g/dL   Hematocrit 38.8 34.0 - 46.6 %   MCV 86 79 - 97 fL   MCH 29.0 26.6 - 33.0 pg   MCHC 33.8 31.5 - 35.7 g/dL   RDW 12.4 12.3 - 15.4 %   Platelets 282 150 - 450 x10E3/uL   Neutrophils 40 Not Estab. %   Lymphs 48 Not Estab. %   Monocytes 9 Not Estab. %   Eos 2 Not Estab. %   Basos 1 Not Estab. %   Neutrophils Absolute 3.4 1.4 - 7.0 x10E3/uL   Lymphocytes Absolute 4.3 (H) 0.7 - 3.1 x10E3/uL   Monocytes Absolute 0.7 0.1 - 0.9 x10E3/uL   EOS (ABSOLUTE) 0.2 0.0 - 0.4 x10E3/uL   Basophils Absolute 0.0 0.0 - 0.2 x10E3/uL   Immature Granulocytes 0 Not Estab. %   Immature Grans (Abs) 0.0 0.0 - 0.1 x10E3/uL  Comprehensive metabolic panel  Result Value Ref Range   Glucose 90 65 - 99 mg/dL   BUN 15 8 - 27 mg/dL   Creatinine, Ser 0.95 0.57 - 1.00 mg/dL   GFR calc non Af Amer 65 >59 mL/min/1.73   GFR calc Af Amer 75 >59 mL/min/1.73   BUN/Creatinine Ratio 16 12 - 28   Sodium 142 134 - 144 mmol/L   Potassium 3.9 3.5 - 5.2 mmol/L   Chloride 104 96 - 106 mmol/L   CO2 23 20 - 29 mmol/L   Calcium 9.6 8.7 - 10.3 mg/dL  Total Protein 7.1 6.0 - 8.5 g/dL   Albumin 4.5 3.6 - 4.8 g/dL   Globulin, Total 2.6 1.5 - 4.5  g/dL   Albumin/Globulin Ratio 1.7 1.2 - 2.2   Bilirubin Total 0.8 0.0 - 1.2 mg/dL   Alkaline Phosphatase 80 39 - 117 IU/L   AST 31 0 - 40 IU/L   ALT 40 (H) 0 - 32 IU/L  Lipid Panel w/o Chol/HDL Ratio  Result Value Ref Range   Cholesterol, Total 220 (H) 100 - 199 mg/dL   Triglycerides 111 0 - 149 mg/dL   HDL 49 >39 mg/dL   VLDL Cholesterol Cal 22 5 - 40 mg/dL   LDL Calculated 149 (H) 0 - 99 mg/dL  Microalbumin, Urine Waived  Result Value Ref Range   Microalb, Ur Waived 80 (H) 0 - 19 mg/L   Creatinine, Urine Waived 300 10 - 300 mg/dL   Microalb/Creat Ratio 30-300 (H) <30 mg/g  TSH  Result Value Ref Range   TSH 0.983 0.450 - 4.500 uIU/mL  UA/M w/rflx Culture, Routine  Result Value Ref Range   Specific Gravity, UA >1.030 (H) 1.005 - 1.030   pH, UA 5.5 5.0 - 7.5   Color, UA Yellow Yellow   Appearance Ur Hazy (A) Clear   Leukocytes, UA Negative Negative   Protein, UA 1+ (A) Negative/Trace   Glucose, UA Negative Negative   Ketones, UA 1+ (A) Negative   RBC, UA Negative Negative   Bilirubin, UA Negative Negative   Urobilinogen, Ur 1.0 0.2 - 1.0 mg/dL   Nitrite, UA Negative Negative   Microscopic Examination See below:   VITAMIN D 25 Hydroxy (Vit-D Deficiency, Fractures)  Result Value Ref Range   Vit D, 25-Hydroxy 26.2 (L) 30.0 - 100.0 ng/mL      Assessment & Plan:   Problem List Items Addressed This Visit    None    Visit Diagnoses    Neck pain    -  Primary   In acute exacerbation due to her stress. She does have somatic dysfunction that is contributing to her symptoms. Treated today with good results as below.    Sacral region somatic dysfunction       Somatic dysfunction of pelvis region       Thoracic segment dysfunction       Rib cage region somatic dysfunction       Somatic dysfunction of abdominal region       Cervical somatic dysfunction       Head region somatic dysfunction       Somatic dysfunction of lower extremity         After verbal consent was  obtained, patient was treated today with osteopathic manipulative medicine to the regions of the head, neck, thorax, ribs, pelvis, sacrum, abdomen and lower extremity using the techniques of cranial, myofascial release, counterstrain, muscle energy, HVLA and soft tissue. Areas of compensation relating to her primary pain source also treated. Patient tolerated the procedure well with good objective and good subjective improvement in symptoms. She left the room in good condition. She was advised to stay well hydrated and that she may have some soreness following the procedure. If not improving or worsening, she will call and come in. She will return for reevaluation  on a PRN basis.   Follow up plan: Return if symptoms worsen or fail to improve.

## 2018-06-08 ENCOUNTER — Other Ambulatory Visit: Payer: Self-pay | Admitting: Family Medicine

## 2018-06-21 ENCOUNTER — Encounter: Payer: Self-pay | Admitting: Family Medicine

## 2018-07-05 ENCOUNTER — Encounter: Payer: Self-pay | Admitting: Family Medicine

## 2018-07-06 ENCOUNTER — Other Ambulatory Visit: Payer: Self-pay | Admitting: Family Medicine

## 2018-08-02 ENCOUNTER — Other Ambulatory Visit: Payer: Self-pay | Admitting: Family Medicine

## 2018-08-02 NOTE — Telephone Encounter (Signed)
Requested medication (s) are due for refill today -yes  Requested medication (s) are on the active medication list -yes  Future visit scheduled -no  Last refill: 3 weeks ago with note needs appointment  Notes to clinic: patient had normal TSH 02/20/18- per refill protocol- patient should be clear for 1 year unless there is problem- sent for PCP review of request   Requested Prescriptions  Pending Prescriptions Disp Refills   ARMOUR THYROID 60 MG tablet [Pharmacy Med Name: ARMOUR THYROID 60 MG TABLET] 30 tablet 0    Sig: TAKE 1 TABLET BY MOUTH EVERY DAY BEFORE BREAKFAST (NEED APPT**)     Endocrinology:  Hypothyroid Agents Failed - 08/02/2018  1:36 PM      Failed - TSH needs to be rechecked within 3 months after an abnormal result. Refill until TSH is due.      Passed - TSH in normal range and within 360 days    TSH  Date Value Ref Range Status  02/27/2018 0.983 0.450 - 4.500 uIU/mL Final         Passed - Valid encounter within last 12 months    Recent Outpatient Visits          3 months ago Neck pain   [place], Megan P, DO   3 months ago Acute anxiety   [place], [place], DO   5 months ago Essential hypertension   [place], Megan P, DO   11 months ago Depression, major, recurrent, moderate ([place])   [place], Megan P, DO   1 year ago Depression, major, recurrent, moderate ([place])   [place], Megan P, DO              Requested Prescriptions  Pending Prescriptions Disp Refills   ARMOUR THYROID 60 MG tablet [Pharmacy Med Name: ARMOUR THYROID 60 MG TABLET] 30 tablet 0    Sig: TAKE 1 TABLET BY MOUTH EVERY DAY BEFORE BREAKFAST (NEED APPT**)     Endocrinology:  Hypothyroid Agents Failed - 08/02/2018  1:36 PM      Failed - TSH needs to be rechecked within 3 months after an abnormal result. Refill until TSH is due.      Passed - TSH in normal range and within  360 days    TSH  Date Value Ref Range Status  02/27/2018 0.983 0.450 - 4.500 uIU/mL Final         Passed - Valid encounter within last 12 months    Recent Outpatient Visits          3 months ago Neck pain   [place], [place], DO   3 months ago Acute anxiety   [place], [place], DO   5 months ago Essential hypertension   [place], Megan P, DO   11 months ago Depression, major, recurrent, moderate ([place])   [place], Megan P, DO   1 year ago Depression, major, recurrent, moderate ([place])   [place], [place], DO

## 2018-08-15 ENCOUNTER — Encounter: Payer: Self-pay | Admitting: Family Medicine

## 2018-08-16 ENCOUNTER — Other Ambulatory Visit: Payer: Self-pay | Admitting: Family Medicine

## 2018-08-16 MED ORDER — BUPROPION HCL ER (XL) 300 MG PO TB24: 300.0000 mg | ORAL_TABLET | Freq: Every day | ORAL | 1 refills | Status: DC

## 2018-08-28 ENCOUNTER — Encounter: Payer: Self-pay | Admitting: Family Medicine

## 2018-08-30 NOTE — Telephone Encounter (Signed)
FYI pt called in due to not hearing back scheduled virtual appt for tomorrow.

## 2018-08-31 ENCOUNTER — Encounter: Payer: Self-pay | Admitting: Family Medicine

## 2018-08-31 ENCOUNTER — Other Ambulatory Visit: Payer: Self-pay

## 2018-08-31 ENCOUNTER — Telehealth (INDEPENDENT_AMBULATORY_CARE_PROVIDER_SITE_OTHER): Payer: BC Managed Care – PPO | Admitting: Family Medicine

## 2018-08-31 VITALS — BP 114/74 | HR 89 | Temp 98.1°F | Wt 200.0 lb

## 2018-08-31 DIAGNOSIS — M67441 Ganglion, right hand: Secondary | ICD-10-CM | POA: Diagnosis not present

## 2018-08-31 MED ORDER — DICLOFENAC SODIUM 75 MG PO TBEC: 75.0000 mg | DELAYED_RELEASE_TABLET | Freq: Two times a day (BID) | ORAL | 0 refills | Status: DC

## 2018-08-31 NOTE — Progress Notes (Signed)
BP 114/74   Pulse 89   Temp 98.1 F (36.7 C) (Oral)   Wt 200 lb (90.7 kg)   LMP  (LMP Unknown)   BMI 34.33 kg/m    Subjective:    Patient ID: Tracie Sheppard, female    DOB: Feb 26, 1956, 63 y.o.   MRN: [id number]  HPI: Tracie Sheppard is a 63 y.o. female  Chief Complaint  Patient presents with  . Hand Pain    knot in right hand close to the wrist. swollen. x about 2 weeks   HAND PAIN- thought it was carpal tunnel, so she used her braces, but it didn't seem to help, seems like it has been swelling, so she called to get it checked out Duration: a couple of weeks Involved hand: right Mechanism of injury: no injury Location: dorsal Onset: gradual Severity: moderate  Quality: aching and sore Frequency: constant Radiation: yes into the arm Aggravating factors: moving it, open jars, grabbing Alleviating factors: nothing Treatments attempted: blue emu Relief with NSAIDs?: No NSAIDs Taken Weakness: yes Numbness: yes Redness: no Swelling:yes Bruising: no Fevers: no  Relevant past medical, surgical, family and social history reviewed and updated as indicated. Interim medical history since our last visit reviewed. Allergies and medications reviewed and updated.  Review of Systems  Constitutional: Negative.   Respiratory: Negative.   Cardiovascular: Negative.   Musculoskeletal: Positive for arthralgias, joint swelling and myalgias. Negative for back pain, gait problem, neck pain and neck stiffness.  Skin: Negative.   Neurological: Negative.   Psychiatric/Behavioral: Negative.     Per HPI unless specifically indicated above     Objective:    BP 114/74   Pulse 89   Temp 98.1 F (36.7 C) (Oral)   Wt 200 lb (90.7 kg)   LMP  (LMP Unknown)   BMI 34.33 kg/m   Wt Readings from Last 3 Encounters:  08/31/18 200 lb (90.7 kg)  04/10/18 203 lb (92.1 kg)  04/05/18 202 lb 11.2 oz (91.9 kg)    Physical Exam Vitals signs and nursing note reviewed.  Constitutional:    General: She is not in acute distress.    Appearance: Normal appearance. She is not ill-appearing, toxic-appearing or diaphoretic.  HENT:     Head: Normocephalic and atraumatic.     Right Ear: External ear normal.     Left Ear: External ear normal.     Nose: Nose normal.     Mouth/Throat:     Mouth: Mucous membranes are moist.     Pharynx: Oropharynx is clear.  Eyes:     General: No scleral icterus.       Right eye: No discharge.        Left eye: No discharge.     Conjunctiva/sclera: Conjunctivae normal.     Pupils: Pupils are equal, round, and reactive to light.  Neck:     Musculoskeletal: Normal range of motion.  Pulmonary:     Effort: Pulmonary effort is normal. No respiratory distress.     Comments: Speaking in full sentences Musculoskeletal: Normal range of motion.     Comments: 1-2 inch swelling on the back of her R hand with mild swelling down her brachioradialis  Skin:    Coloration: Skin is not jaundiced or pale.     Findings: No bruising, erythema, lesion or rash.  Neurological:     Mental Status: She is alert and oriented to person, place, and time. Mental status is at baseline.  Psychiatric:  Mood and Affect: Mood normal.        Behavior: Behavior normal.        Thought Content: Thought content normal.        Judgment: Judgment normal.     Results for orders placed or performed in visit on 02/27/18  Microscopic Examination  Result Value Ref Range   WBC, UA 0-5 0 - 5 /hpf   RBC, UA 0-2 0 - 2 /hpf   Epithelial Cells (non renal) 0-10 0 - 10 /hpf   Mucus, UA Present Not Estab.   Bacteria, UA None seen None seen/Few  Bayer DCA Hb A1c Waived  Result Value Ref Range   HB A1C (BAYER DCA - WAIVED) 5.6 <7.0 %  CBC with Differential/Platelet  Result Value Ref Range   WBC 8.6 3.4 - 10.8 x10E3/uL   RBC 4.51 3.77 - 5.28 x10E6/uL   Hemoglobin 13.1 11.1 - 15.9 g/dL   Hematocrit 38.8 34.0 - 46.6 %   MCV 86 79 - 97 fL   MCH 29.0 26.6 - 33.0 pg   MCHC 33.8 31.5 -  35.7 g/dL   RDW 12.4 12.3 - 15.4 %   Platelets 282 150 - 450 x10E3/uL   Neutrophils 40 Not Estab. %   Lymphs 48 Not Estab. %   Monocytes 9 Not Estab. %   Eos 2 Not Estab. %   Basos 1 Not Estab. %   Neutrophils Absolute 3.4 1.4 - 7.0 x10E3/uL   Lymphocytes Absolute 4.3 (H) 0.7 - 3.1 x10E3/uL   Monocytes Absolute 0.7 0.1 - 0.9 x10E3/uL   EOS (ABSOLUTE) 0.2 0.0 - 0.4 x10E3/uL   Basophils Absolute 0.0 0.0 - 0.2 x10E3/uL   Immature Granulocytes 0 Not Estab. %   Immature Grans (Abs) 0.0 0.0 - 0.1 x10E3/uL  Comprehensive metabolic panel  Result Value Ref Range   Glucose 90 65 - 99 mg/dL   BUN 15 8 - 27 mg/dL   Creatinine, Ser 0.95 0.57 - 1.00 mg/dL   GFR calc non Af Amer 65 >59 mL/min/1.73   GFR calc Af Amer 75 >59 mL/min/1.73   BUN/Creatinine Ratio 16 12 - 28   Sodium 142 134 - 144 mmol/L   Potassium 3.9 3.5 - 5.2 mmol/L   Chloride 104 96 - 106 mmol/L   CO2 23 20 - 29 mmol/L   Calcium 9.6 8.7 - 10.3 mg/dL   Total Protein 7.1 6.0 - 8.5 g/dL   Albumin 4.5 3.6 - 4.8 g/dL   Globulin, Total 2.6 1.5 - 4.5 g/dL   Albumin/Globulin Ratio 1.7 1.2 - 2.2   Bilirubin Total 0.8 0.0 - 1.2 mg/dL   Alkaline Phosphatase 80 39 - 117 IU/L   AST 31 0 - 40 IU/L   ALT 40 (H) 0 - 32 IU/L  Lipid Panel w/o Chol/HDL Ratio  Result Value Ref Range   Cholesterol, Total 220 (H) 100 - 199 mg/dL   Triglycerides 111 0 - 149 mg/dL   HDL 49 >39 mg/dL   VLDL Cholesterol Cal 22 5 - 40 mg/dL   LDL Calculated 149 (H) 0 - 99 mg/dL  Microalbumin, Urine Waived  Result Value Ref Range   Microalb, Ur Waived 80 (H) 0 - 19 mg/L   Creatinine, Urine Waived 300 10 - 300 mg/dL   Microalb/Creat Ratio 30-300 (H) <30 mg/g  TSH  Result Value Ref Range   TSH 0.983 0.450 - 4.500 uIU/mL  UA/M w/rflx Culture, Routine  Result Value Ref Range   Specific Gravity, UA >1.030 (  H) 1.005 - 1.030   pH, UA 5.5 5.0 - 7.5   Color, UA Yellow Yellow   Appearance Ur Hazy (A) Clear   Leukocytes, UA Negative Negative   Protein, UA 1+ (A)  Negative/Trace   Glucose, UA Negative Negative   Ketones, UA 1+ (A) Negative   RBC, UA Negative Negative   Bilirubin, UA Negative Negative   Urobilinogen, Ur 1.0 0.2 - 1.0 mg/dL   Nitrite, UA Negative Negative   Microscopic Examination See below:   VITAMIN D 25 Hydroxy (Vit-D Deficiency, Fractures)  Result Value Ref Range   Vit D, 25-Hydroxy 26.2 (L) 30.0 - 100.0 ng/mL      Assessment & Plan:   Problem List Items Addressed This Visit    None    Visit Diagnoses    Ganglion cyst of tendon sheath of right hand    -  Primary   Will start diclofenac and montior. Recheck 2 weeks. If not better, will get into ortho.       Follow up plan: Return if symptoms worsen or fail to improve.     . This visit was completed via mychart due to the restrictions of the COVID-19 pandemic. All issues as above were discussed and addressed. Physical exam was done as above through visual confirmation on mychart. If it was felt that the patient should be evaluated in the office, they were directed there. The patient verbally consented to this visit. . Location of the patient: home . Location of the provider: work . Those involved with this call:  . Provider: Park Liter, DO . CMA: Lesle Chris, [place] . Front Desk/Registration: Don Perking  . Time spent on call: 15 minutes with patient face to face via video conference. More than 50% of this time was spent in counseling and coordination of care. 23 minutes total spent in review of patient's record and preparation of their chart.

## 2018-08-31 NOTE — Patient Instructions (Signed)
Ganglion Cyst    A ganglion cyst is a non-cancerous, fluid-filled lump that occurs near a joint or tendon. The cyst grows out of a joint or the lining of a tendon. Ganglion cysts most often develop in the hand or wrist, but they can also develop in the shoulder, elbow, hip, knee, ankle, or foot.  Ganglion cysts are ball-shaped or egg-shaped. Their size can range from the size of a pea to larger than a grape. Increased activity may cause the cyst to get bigger because more fluid starts to build up.  What are the causes?  The exact cause of this condition is not known, but it may be related to:   Inflammation or irritation around the joint.   An injury.   Repetitive movements or overuse.   Arthritis.  What increases the risk?  You are more likely to develop this condition if:   You are a woman.   You are 15-40 years old.  What are the signs or symptoms?  The main symptom of this condition is a lump. It most often appears on the hand or wrist. In many cases, there are no other symptoms, but a cyst can sometimes cause:   Tingling.   Pain.   Numbness.   Muscle weakness.   Weak grip.   Less range of motion in a joint.  How is this diagnosed?  Ganglion cysts are usually diagnosed based on a physical exam. Your health care provider will feel the lump and may shine a light next to it. If it is a ganglion cyst, the light will likely shine through it.  Your health care provider may order an X-ray, ultrasound, or MRI to rule out other conditions.  How is this treated?  Ganglion cysts often go away on their own without treatment. If you have pain or other symptoms, treatment may be needed. Treatment is also needed if the ganglion cyst limits your movement or if it gets infected. Treatment may include:   Wearing a brace or splint on your wrist or finger.   Taking anti-inflammatory medicine.   Having fluid drained from the lump with a needle (aspiration).   Getting a steroid injected into the joint.   Having  surgery to remove the ganglion cyst.   Placing a pad on your shoe or wearing shoes that will not rub against the cyst if it is on your foot.  Follow these instructions at home:   Do not press on the ganglion cyst, poke it with a needle, or hit it.   Take over-the-counter and prescription medicines only as told by your health care provider.   If you have a brace or splint:  ? Wear it as told by your health care provider.  ? Remove it as told by your health care provider. Ask if you need to remove it when you take a shower or a bath.   Watch your ganglion cyst for any changes.   Keep all follow-up visits as told by your health care provider. This is important.  Contact a health care provider if:   Your ganglion cyst becomes larger or more painful.   You have pus coming from the lump.   You have weakness or numbness in the affected area.   You have a fever or chills.  Get help right away if:   You have a fever and have any of these in the cyst area:  ? Increased redness.  ? Red streaks.  ? Swelling.  Summary     A ganglion cyst is a non-cancerous, fluid-filled lump that occurs near a joint or tendon.   Ganglion cysts most often develop in the hand or wrist, but they can also develop in the shoulder, elbow, hip, knee, ankle, or foot.   Ganglion cysts often go away on their own without treatment.  This information is not intended to replace advice given to you by your health care provider. Make sure you discuss any questions you have with your health care provider.  Document Released: 03/18/2000 Document Revised: 11/18/2016 Document Reviewed: 11/18/2016  Elsevier Interactive Patient Education  2019 [employer]

## 2018-09-09 ENCOUNTER — Other Ambulatory Visit: Payer: Self-pay | Admitting: Family Medicine

## 2018-09-09 NOTE — Telephone Encounter (Signed)
Requested Prescriptions  Pending Prescriptions Disp Refills  . thyroid (ARMOUR THYROID) 60 MG tablet [Pharmacy Med Name: ARMOUR THYROID 60 MG TABLET] 30 tablet 0    Sig: TAKE 1 TABLET BY MOUTH EVERY DAY BEFORE BREAKFAST     Endocrinology:  Hypothyroid Agents Failed - 09/09/2018 11:32 AM      Failed - TSH needs to be rechecked within 3 months after an abnormal result. Refill until TSH is due.      Passed - TSH in normal range and within 360 days    TSH  Date Value Ref Range Status  02/27/2018 0.983 0.450 - 4.500 uIU/mL Final         Passed - Valid encounter within last 12 months    Recent Outpatient Visits          5 months ago Neck pain   [place], [place], DO   5 months ago Acute anxiety   [place], [place], DO   6 months ago Essential hypertension   [place], DO   1 year ago Depression, major, recurrent, moderate ([place])   [place], DO   1 year ago Depression, major, recurrent, moderate ([place])   [place], [place], DO

## 2018-10-02 ENCOUNTER — Encounter: Payer: Self-pay | Admitting: Family Medicine

## 2018-10-04 ENCOUNTER — Ambulatory Visit (INDEPENDENT_AMBULATORY_CARE_PROVIDER_SITE_OTHER): Payer: BC Managed Care – PPO | Admitting: Family Medicine

## 2018-10-04 ENCOUNTER — Encounter: Payer: Self-pay | Admitting: Family Medicine

## 2018-10-04 ENCOUNTER — Other Ambulatory Visit: Payer: Self-pay

## 2018-10-04 VITALS — BP 134/85 | HR 79 | Temp 98.4°F | Ht 64.0 in | Wt 207.0 lb

## 2018-10-04 DIAGNOSIS — L821 Other seborrheic keratosis: Secondary | ICD-10-CM | POA: Diagnosis not present

## 2018-10-04 DIAGNOSIS — R1310 Dysphagia, unspecified: Secondary | ICD-10-CM

## 2018-10-04 DIAGNOSIS — M67441 Ganglion, right hand: Secondary | ICD-10-CM

## 2018-10-04 MED ORDER — TRIAMCINOLONE ACETONIDE 0.5 % EX OINT: 1.0000 "application " | TOPICAL_OINTMENT | Freq: Two times a day (BID) | CUTANEOUS | 0 refills | Status: DC

## 2018-10-04 NOTE — Progress Notes (Signed)
BP 134/85   Pulse 79   Temp 98.4 F (36.9 C) (Oral)   Ht 5\' 4"  (1.626 m)   Wt 207 lb (93.9 kg)   LMP  (LMP Unknown)   SpO2 97%   BMI 35.53 kg/m    Subjective:    Patient ID: Tracie Sheppard, female    DOB: 1955/12/30, 63 y.o.   MRN: [id number]  HPI: Tracie Sheppard is a 63 y.o. female  Chief Complaint  Patient presents with  . Cyst    right hand  . Rash    left breast around the nipple. first noticed a month ago   LUMP- no change. Feels like the voltaren helps, but not better.  Duration: about a month to month and a half Location: R dorsal wrist Onset: gradual Painful: yes Discomfort: yes Status:  not changing Trauma: no Redness: no Bruising: no Recent infection: no Swollen lymph nodes: no Requesting removal: no History of cancer: no Family history of cancer: no History of the same: no  RASH Duration:  Several weeks at least, been there "a while"  Location: L breast  Itching: no Burning: no Redness: no Oozing: no Scaling: yes Blisters: no Painful: no Fevers: no Change in detergents/soaps/personal care products: no Recent illness: no Recent travel:no History of same: no Context: stable Alleviating factors: nothing Treatments attempted:lotion/moisturizer Shortness of breath: no  Throat/tongue swelling: no Myalgias/arthralgias: no  DYSPHAGIA Duration: chronic- worse in the last 3 weeks Description of symptom: takes effort to swallow Onset: Immediately upon swallowing Location of dysphagia: throat Dysphagia to solids only: no Dysphagia to solids & liquids: yes  Frequency:constant  Progressively getting worse: yes Alleviatiating factors: nothing Provoking factors: all foods Status: worse EGD: no Weight loss: no Sensation of lump in throat: yes Heartburn: yes Odynophagia: yes Nausea: no Vomiting: no Drooling/nasal regurgitation/food spillage: no Coughing/choking/dysphonia: no Dysarthria: no Hematemesis: no Regurgitation of undigested  food/halitosis: no Chest pain: no  Relevant past medical, surgical, family and social history reviewed and updated as indicated. Interim medical history since our last visit reviewed. Allergies and medications reviewed and updated.  Review of Systems  Constitutional: Negative.   Respiratory: Negative.   Cardiovascular: Negative.   Gastrointestinal: Negative.   Genitourinary: Negative.   Musculoskeletal: Positive for joint swelling. Negative for arthralgias, back pain, gait problem, myalgias, neck pain and neck stiffness.  Skin: Positive for rash. Negative for color change, pallor and wound.  Neurological: Negative.   Psychiatric/Behavioral: Negative.     Per HPI unless specifically indicated above     Objective:    BP 134/85   Pulse 79   Temp 98.4 F (36.9 C) (Oral)   Ht 5\' 4"  (1.626 m)   Wt 207 lb (93.9 kg)   LMP  (LMP Unknown)   SpO2 97%   BMI 35.53 kg/m   Wt Readings from Last 3 Encounters:  10/04/18 207 lb (93.9 kg)  08/31/18 200 lb (90.7 kg)  04/10/18 203 lb (92.1 kg)    Physical Exam Vitals signs and nursing note reviewed.  Constitutional:      General: She is not in acute distress.    Appearance: Normal appearance. She is not ill-appearing, toxic-appearing or diaphoretic.  HENT:     Head: Normocephalic and atraumatic.     Right Ear: External ear normal.     Left Ear: External ear normal.     Nose: Nose normal.     Mouth/Throat:     Mouth: Mucous membranes are moist.     Pharynx:  Oropharynx is clear.  Eyes:     General: No scleral icterus.       Right eye: No discharge.        Left eye: No discharge.     Extraocular Movements: Extraocular movements intact.     Conjunctiva/sclera: Conjunctivae normal.     Pupils: Pupils are equal, round, and reactive to light.  Neck:     Musculoskeletal: Normal range of motion and neck supple.  Cardiovascular:     Rate and Rhythm: Normal rate and regular rhythm.     Pulses: Normal pulses.     Heart sounds: Normal  heart sounds. No murmur. No friction rub. No gallop.   Pulmonary:     Effort: Pulmonary effort is normal. No respiratory distress.     Breath sounds: Normal breath sounds. No stridor. No wheezing, rhonchi or rales.  Chest:     Chest wall: No tenderness.    Abdominal:     General: Abdomen is flat. Bowel sounds are normal. There is no distension.     Palpations: Abdomen is soft. There is no mass.     Tenderness: There is no abdominal tenderness. There is no right CVA tenderness, left CVA tenderness, guarding or rebound.     Hernia: No hernia is present.  Musculoskeletal: Normal range of motion.        General: Swelling (in dorsum of R wrist with some swelling into the hand) present.  Skin:    General: Skin is warm and dry.     Capillary Refill: Capillary refill takes less than 2 seconds.     Coloration: Skin is not jaundiced or pale.     Findings: No bruising, erythema, lesion or rash.  Neurological:     General: No focal deficit present.     Mental Status: She is alert and oriented to person, place, and time. Mental status is at baseline.  Psychiatric:        Mood and Affect: Mood normal.        Behavior: Behavior normal.        Thought Content: Thought content normal.        Judgment: Judgment normal.     Results for orders placed or performed in visit on 02/27/18  Microscopic Examination   BLD  Result Value Ref Range   WBC, UA 0-5 0 - 5 /hpf   RBC, UA 0-2 0 - 2 /hpf   Epithelial Cells (non renal) 0-10 0 - 10 /hpf   Mucus, UA Present Not Estab.   Bacteria, UA None seen None seen/Few  Bayer DCA Hb A1c Waived  Result Value Ref Range   HB A1C (BAYER DCA - WAIVED) 5.6 <7.0 %  CBC with Differential/Platelet  Result Value Ref Range   WBC 8.6 3.4 - 10.8 x10E3/uL   RBC 4.51 3.77 - 5.28 x10E6/uL   Hemoglobin 13.1 11.1 - 15.9 g/dL   Hematocrit 38.8 34.0 - 46.6 %   MCV 86 79 - 97 fL   MCH 29.0 26.6 - 33.0 pg   MCHC 33.8 31.5 - 35.7 g/dL   RDW 12.4 12.3 - 15.4 %   Platelets  282 150 - 450 x10E3/uL   Neutrophils 40 Not Estab. %   Lymphs 48 Not Estab. %   Monocytes 9 Not Estab. %   Eos 2 Not Estab. %   Basos 1 Not Estab. %   Neutrophils Absolute 3.4 1.4 - 7.0 x10E3/uL   Lymphocytes Absolute 4.3 (H) 0.7 - 3.1 x10E3/uL   Monocytes  Absolute 0.7 0.1 - 0.9 x10E3/uL   EOS (ABSOLUTE) 0.2 0.0 - 0.4 x10E3/uL   Basophils Absolute 0.0 0.0 - 0.2 x10E3/uL   Immature Granulocytes 0 Not Estab. %   Immature Grans (Abs) 0.0 0.0 - 0.1 x10E3/uL  Comprehensive metabolic panel  Result Value Ref Range   Glucose 90 65 - 99 mg/dL   BUN 15 8 - 27 mg/dL   Creatinine, Ser 0.95 0.57 - 1.00 mg/dL   GFR calc non Af Amer 65 >59 mL/min/1.73   GFR calc Af Amer 75 >59 mL/min/1.73   BUN/Creatinine Ratio 16 12 - 28   Sodium 142 134 - 144 mmol/L   Potassium 3.9 3.5 - 5.2 mmol/L   Chloride 104 96 - 106 mmol/L   CO2 23 20 - 29 mmol/L   Calcium 9.6 8.7 - 10.3 mg/dL   Total Protein 7.1 6.0 - 8.5 g/dL   Albumin 4.5 3.6 - 4.8 g/dL   Globulin, Total 2.6 1.5 - 4.5 g/dL   Albumin/Globulin Ratio 1.7 1.2 - 2.2   Bilirubin Total 0.8 0.0 - 1.2 mg/dL   Alkaline Phosphatase 80 39 - 117 IU/L   AST 31 0 - 40 IU/L   ALT 40 (H) 0 - 32 IU/L  Lipid Panel w/o Chol/HDL Ratio  Result Value Ref Range   Cholesterol, Total 220 (H) 100 - 199 mg/dL   Triglycerides 111 0 - 149 mg/dL   HDL 49 >39 mg/dL   VLDL Cholesterol Cal 22 5 - 40 mg/dL   LDL Calculated 149 (H) 0 - 99 mg/dL  Microalbumin, Urine Waived  Result Value Ref Range   Microalb, Ur Waived 80 (H) 0 - 19 mg/L   Creatinine, Urine Waived 300 10 - 300 mg/dL   Microalb/Creat Ratio 30-300 (H) <30 mg/g  TSH  Result Value Ref Range   TSH 0.983 0.450 - 4.500 uIU/mL  UA/M w/rflx Culture, Routine   Specimen: Blood   BLD  Result Value Ref Range   Specific Gravity, UA >1.030 (H) 1.005 - 1.030   pH, UA 5.5 5.0 - 7.5   Color, UA Yellow Yellow   Appearance Ur Hazy (A) Clear   Leukocytes, UA Negative Negative   Protein, UA 1+ (A) Negative/Trace    Glucose, UA Negative Negative   Ketones, UA 1+ (A) Negative   RBC, UA Negative Negative   Bilirubin, UA Negative Negative   Urobilinogen, Ur 1.0 0.2 - 1.0 mg/dL   Nitrite, UA Negative Negative   Microscopic Examination See below:   VITAMIN D 25 Hydroxy (Vit-D Deficiency, Fractures)  Result Value Ref Range   Vit D, 25-Hydroxy 26.2 (L) 30.0 - 100.0 ng/mL      Assessment & Plan:   Problem List Items Addressed This Visit    None    Visit Diagnoses    Ganglion cyst of tendon sheath of right hand    -  Primary   Will get her into see hand surgeon. Call with any concerns.    Relevant Orders   Ambulatory referral to Hand Surgery   Dysphagia, unspecified type       Will get her into GI. Await their input. Call with any concerns.    Relevant Orders   Ambulatory referral to Gastroenterology   Other seborrheic keratosis       Reassured patient. Triamcinalone for itching. Call with any concerns.        Follow up plan: Return if symptoms worsen or fail to improve.

## 2018-10-06 ENCOUNTER — Encounter: Payer: Self-pay | Admitting: Family Medicine

## 2018-10-11 ENCOUNTER — Ambulatory Visit: Payer: BC Managed Care – PPO | Admitting: Family Medicine

## 2018-10-11 ENCOUNTER — Other Ambulatory Visit: Payer: Self-pay | Admitting: Family Medicine

## 2018-10-15 ENCOUNTER — Ambulatory Visit: Payer: BC Managed Care – PPO | Admitting: Family Medicine

## 2018-10-29 ENCOUNTER — Ambulatory Visit: Payer: BC Managed Care – PPO | Admitting: Family Medicine

## 2018-10-29 ENCOUNTER — Other Ambulatory Visit: Payer: Self-pay

## 2018-10-29 ENCOUNTER — Encounter: Payer: Self-pay | Admitting: Family Medicine

## 2018-10-29 VITALS — BP 137/81 | HR 84 | Temp 98.4°F | Ht 64.5 in | Wt 204.0 lb

## 2018-10-29 DIAGNOSIS — M542 Cervicalgia: Secondary | ICD-10-CM | POA: Diagnosis not present

## 2018-10-29 DIAGNOSIS — M9901 Segmental and somatic dysfunction of cervical region: Secondary | ICD-10-CM | POA: Diagnosis not present

## 2018-10-29 DIAGNOSIS — M9900 Segmental and somatic dysfunction of head region: Secondary | ICD-10-CM

## 2018-10-29 DIAGNOSIS — M9903 Segmental and somatic dysfunction of lumbar region: Secondary | ICD-10-CM

## 2018-10-29 DIAGNOSIS — M9902 Segmental and somatic dysfunction of thoracic region: Secondary | ICD-10-CM

## 2018-10-29 DIAGNOSIS — M545 Low back pain, unspecified: Secondary | ICD-10-CM

## 2018-10-29 DIAGNOSIS — M9904 Segmental and somatic dysfunction of sacral region: Secondary | ICD-10-CM | POA: Diagnosis not present

## 2018-10-29 DIAGNOSIS — M9907 Segmental and somatic dysfunction of upper extremity: Secondary | ICD-10-CM

## 2018-10-29 DIAGNOSIS — M9906 Segmental and somatic dysfunction of lower extremity: Secondary | ICD-10-CM

## 2018-10-29 DIAGNOSIS — M9909 Segmental and somatic dysfunction of abdomen and other regions: Secondary | ICD-10-CM

## 2018-10-29 DIAGNOSIS — M25552 Pain in left hip: Secondary | ICD-10-CM

## 2018-10-29 DIAGNOSIS — M9908 Segmental and somatic dysfunction of rib cage: Secondary | ICD-10-CM

## 2018-10-29 DIAGNOSIS — M9905 Segmental and somatic dysfunction of pelvic region: Secondary | ICD-10-CM

## 2018-10-29 NOTE — Assessment & Plan Note (Signed)
In subacute exacerbation due to carrying her grandson. She does have somatic dysfunction that is contributing to her symptoms. Treated today as below.

## 2018-10-29 NOTE — Progress Notes (Signed)
BP 137/81   Pulse 84   Temp 98.4 F (36.9 C) (Oral)   Ht 5' 4.5" (1.638 m)   Wt 204 lb (92.5 kg)   LMP  (LMP Unknown)   SpO2 97%   BMI 34.48 kg/m    Subjective:    Patient ID: Tracie Sheppard, female    DOB: Jun 26, 1955, 63 y.o.   MRN: [id number]  HPI: Tracie Sheppard is a 64 y.o. female  Chief Complaint  Patient presents with  . Neck Pain    OMM  . Back Pain  . Hip Pain   Fred presents today for evaluation and possible treatment with OMT. It's been several months since she has had a treatment due to the COVID-19 pandemic. She notes that she is hurting in her back, left hip, shoulders and neck. Pain has been going on for at least 3 weeks, possibly longer. She has been carrying her 93yo grandson on her L hip. Pain is aching and sore in nature. Better with OMT and stretching at home, worse with stress and carrying her grandson. No numbness and tingling. She is otherwise feeling well with no other concerns or complaints at that time  Relevant past medical, surgical, family and social history reviewed and updated as indicated. Interim medical history since our last visit reviewed. Allergies and medications reviewed and updated.  Review of Systems  Constitutional: Negative.   Respiratory: Negative.   Cardiovascular: Negative.   Gastrointestinal: Negative.   Musculoskeletal: Positive for arthralgias, back pain, myalgias, neck pain and neck stiffness. Negative for gait problem and joint swelling.  Skin: Negative.   Neurological: Negative.   Psychiatric/Behavioral: Negative.     Per HPI unless specifically indicated above     Objective:    BP 137/81   Pulse 84   Temp 98.4 F (36.9 C) (Oral)   Ht 5' 4.5" (1.638 m)   Wt 204 lb (92.5 kg)   LMP  (LMP Unknown)   SpO2 97%   BMI 34.48 kg/m   Wt Readings from Last 3 Encounters:  10/29/18 204 lb (92.5 kg)  10/04/18 207 lb (93.9 kg)  08/31/18 200 lb (90.7 kg)    Physical Exam Vitals signs and nursing note reviewed.   Constitutional:      General: She is not in acute distress.    Appearance: Normal appearance. She is not ill-appearing.  HENT:     Head: Normocephalic and atraumatic.     Right Ear: External ear normal.     Left Ear: External ear normal.     Nose: Nose normal.     Mouth/Throat:     Mouth: Mucous membranes are moist.     Pharynx: Oropharynx is clear.  Eyes:     Extraocular Movements: Extraocular movements intact.     Conjunctiva/sclera: Conjunctivae normal.     Pupils: Pupils are equal, round, and reactive to light.  Neck:     Musculoskeletal: No muscular tenderness.     Vascular: No carotid bruit.  Cardiovascular:     Rate and Rhythm: Normal rate.     Pulses: Normal pulses.  Pulmonary:     Effort: Pulmonary effort is normal. No respiratory distress.  Abdominal:     General: Abdomen is flat. There is no distension.     Palpations: Abdomen is soft. There is no mass.     Tenderness: There is no abdominal tenderness. There is no right CVA tenderness, left CVA tenderness, guarding or rebound.     Hernia: No hernia is  present.  Lymphadenopathy:     Cervical: No cervical adenopathy.  Skin:    General: Skin is warm and dry.     Capillary Refill: Capillary refill takes less than 2 seconds.     Coloration: Skin is not jaundiced or pale.     Findings: No bruising, erythema, lesion or rash.  Neurological:     General: No focal deficit present.     Mental Status: She is alert. Mental status is at baseline.  Psychiatric:        Mood and Affect: Mood normal.        Behavior: Behavior normal.        Thought Content: Thought content normal.        Judgment: Judgment normal.   Musculoskeletal:  Exam found Decreased ROM, Tissue texture changes, Tenderness to palpation and Asymmetry of patient's  head, neck, thorax, ribs, lumbar, pelvis, sacrum, upper extremity, lower extremity and abdomen Osteopathic Structural Exam:   Head: OAESSR, hypertonic suboccipital muscles  Neck: C3ESRR, SCM  hypertonic on the R  Thorax: T3-6SRRL  Ribs: Ribs 5-7 locked up on the L, Rib 8 locked up on the R  Lumbar: QL hypertonic on the L, psoas hypertonic on the L, L3-5SRRL  Pelvis: Anterior L innominate  Sacrum: L on L torsion, SI joint restricted on the L  Upper Extremity: pec hypertonic on the R  Lower Extremity: IT band hypertonic on the L  Abdomen: diaphragm spasm bilaterally R>L   Results for orders placed or performed in visit on 02/27/18  Microscopic Examination   BLD  Result Value Ref Range   WBC, UA 0-5 0 - 5 /hpf   RBC, UA 0-2 0 - 2 /hpf   Epithelial Cells (non renal) 0-10 0 - 10 /hpf   Mucus, UA Present Not Estab.   Bacteria, UA None seen None seen/Few  Bayer DCA Hb A1c Waived  Result Value Ref Range   HB A1C (BAYER DCA - WAIVED) 5.6 <7.0 %  CBC with Differential/Platelet  Result Value Ref Range   WBC 8.6 3.4 - 10.8 x10E3/uL   RBC 4.51 3.77 - 5.28 x10E6/uL   Hemoglobin 13.1 11.1 - 15.9 g/dL   Hematocrit 38.8 34.0 - 46.6 %   MCV 86 79 - 97 fL   MCH 29.0 26.6 - 33.0 pg   MCHC 33.8 31.5 - 35.7 g/dL   RDW 12.4 12.3 - 15.4 %   Platelets 282 150 - 450 x10E3/uL   Neutrophils 40 Not Estab. %   Lymphs 48 Not Estab. %   Monocytes 9 Not Estab. %   Eos 2 Not Estab. %   Basos 1 Not Estab. %   Neutrophils Absolute 3.4 1.4 - 7.0 x10E3/uL   Lymphocytes Absolute 4.3 (H) 0.7 - 3.1 x10E3/uL   Monocytes Absolute 0.7 0.1 - 0.9 x10E3/uL   EOS (ABSOLUTE) 0.2 0.0 - 0.4 x10E3/uL   Basophils Absolute 0.0 0.0 - 0.2 x10E3/uL   Immature Granulocytes 0 Not Estab. %   Immature Grans (Abs) 0.0 0.0 - 0.1 x10E3/uL  Comprehensive metabolic panel  Result Value Ref Range   Glucose 90 65 - 99 mg/dL   BUN 15 8 - 27 mg/dL   Creatinine, Ser 0.95 0.57 - 1.00 mg/dL   GFR calc non Af Amer 65 >59 mL/min/1.73   GFR calc Af Amer 75 >59 mL/min/1.73   BUN/Creatinine Ratio 16 12 - 28   Sodium 142 134 - 144 mmol/L   Potassium 3.9 3.5 - 5.2 mmol/L   Chloride  104 96 - 106 mmol/L   CO2 23 20 - 29 mmol/L    Calcium 9.6 8.7 - 10.3 mg/dL   Total Protein 7.1 6.0 - 8.5 g/dL   Albumin 4.5 3.6 - 4.8 g/dL   Globulin, Total 2.6 1.5 - 4.5 g/dL   Albumin/Globulin Ratio 1.7 1.2 - 2.2   Bilirubin Total 0.8 0.0 - 1.2 mg/dL   Alkaline Phosphatase 80 39 - 117 IU/L   AST 31 0 - 40 IU/L   ALT 40 (H) 0 - 32 IU/L  Lipid Panel w/o Chol/HDL Ratio  Result Value Ref Range   Cholesterol, Total 220 (H) 100 - 199 mg/dL   Triglycerides 111 0 - 149 mg/dL   HDL 49 >39 mg/dL   VLDL Cholesterol Cal 22 5 - 40 mg/dL   LDL Calculated 149 (H) 0 - 99 mg/dL  Microalbumin, Urine Waived  Result Value Ref Range   Microalb, Ur Waived 80 (H) 0 - 19 mg/L   Creatinine, Urine Waived 300 10 - 300 mg/dL   Microalb/Creat Ratio 30-300 (H) <30 mg/g  TSH  Result Value Ref Range   TSH 0.983 0.450 - 4.500 uIU/mL  UA/M w/rflx Culture, Routine   Specimen: Blood   BLD  Result Value Ref Range   Specific Gravity, UA >1.030 (H) 1.005 - 1.030   pH, UA 5.5 5.0 - 7.5   Color, UA Yellow Yellow   Appearance Ur Hazy (A) Clear   Leukocytes, UA Negative Negative   Protein, UA 1+ (A) Negative/Trace   Glucose, UA Negative Negative   Ketones, UA 1+ (A) Negative   RBC, UA Negative Negative   Bilirubin, UA Negative Negative   Urobilinogen, Ur 1.0 0.2 - 1.0 mg/dL   Nitrite, UA Negative Negative   Microscopic Examination See below:   VITAMIN D 25 Hydroxy (Vit-D Deficiency, Fractures)  Result Value Ref Range   Vit D, 25-Hydroxy 26.2 (L) 30.0 - 100.0 ng/mL      Assessment & Plan:   Problem List Items Addressed This Visit      Other   Low back pain    In subacute exacerbation due to carrying her grandson. She does have somatic dysfunction that is contributing to her symptoms. Treated today as below.        Other Visit Diagnoses    Neck pain    -  Primary   In subacute exacerbation due to carrying her grandson. She does have somatic dysfunction that is contributing to her symptoms. Treated today as below.    Left hip pain       In  subacute exacerbation due to carrying her grandson. She does have somatic dysfunction that is contributing to her symptoms. Treated today as below.    Sacral region somatic dysfunction       Somatic dysfunction of pelvis region       Thoracic segment dysfunction       Rib cage region somatic dysfunction       Somatic dysfunction of abdominal region       Cervical somatic dysfunction       Head region somatic dysfunction       Somatic dysfunction of lower extremity       Somatic dysfunction of upper extremities       Lumbar region somatic dysfunction         After verbal consent was obtained, patient was treated today with osteopathic manipulative medicine to the regions of the head, neck, thorax, ribs, lumbar, pelvis, sacrum, abdomen,  upper extremity and lower extremity using the techniques of cranial, myofascial release, counterstrain, muscle energy, HVLA and soft tissue. Areas of compensation relating to her primary pain source also treated. Patient tolerated the procedure well with good objective and good subjective improvement in symptoms. She left the room in good condition. She was advised to stay well hydrated and that she may have some soreness following the procedure. If not improving or worsening, he will call and come in. She will return for reevaluation   in 2-3 weeks.   Follow up plan: Return 2-3 weeks, for Procedure visit.

## 2018-11-01 ENCOUNTER — Encounter: Payer: Self-pay | Admitting: Gastroenterology

## 2018-11-01 ENCOUNTER — Ambulatory Visit: Payer: BC Managed Care – PPO | Admitting: Gastroenterology

## 2018-11-01 ENCOUNTER — Other Ambulatory Visit: Payer: Self-pay

## 2018-11-01 VITALS — BP 134/67 | HR 87 | Ht 64.0 in | Wt 205.2 lb

## 2018-11-01 DIAGNOSIS — Z1211 Encounter for screening for malignant neoplasm of colon: Secondary | ICD-10-CM

## 2018-11-01 DIAGNOSIS — R1310 Dysphagia, unspecified: Secondary | ICD-10-CM

## 2018-11-01 NOTE — Progress Notes (Signed)
Tracie Darby, MD [address]  [place]  [place], [place] [address]  Main: [phone]  Fax: [phone]    Gastroenterology Consultation  Referring Provider:     Valerie Roys, DO Primary Care Physician:  Valerie Roys, DO Primary Gastroenterologist:  Dr. Cephas Sheppard Reason for Consultation:     Difficulty swallowing        HPI:   Tracie Sheppard is a 63 y.o. female referred by Dr. Wynetta Emery, Barb Merino, DO  for consultation & management of difficulty swallowing  Patient has been experiencing discomfort in her throat, choking sensation.  Although, she does not have trouble keeping her food down.  She is experiencing particularly with solids.  She does have history of somatic dysfunction for which she is undergoing treatment with [facility].  She denies any weight loss, loss of appetite, nausea or vomiting, heartburn.  She does have history of anxiety for which she is taking Wellbutrin, Celexa and Klonopin.  NSAIDs: None  Antiplts/Anticoagulants/Anti thrombotics: None  GI Procedures: Colonoscopy at age 66, normal She denies family history of GI malignancy  Past Medical History:  Diagnosis Date  . Anxiety   . Bilateral carpal tunnel syndrome 05/30/2017  . Chronic back pain   . Diabetes mellitus without complication ([place])    Prediabetic  . High serum testosterone   . Kidney stone   . Thyroid disease    did not show up in lab work but per other symptoms previous doctor started her on this    Past Surgical History:  Procedure Laterality Date  . CHOLECYSTECTOMY     1997  . NASAL SEPTUM SURGERY    . OOPHORECTOMY    . TUBAL LIGATION     1989   Current Outpatient Medications:  .  buPROPion (WELLBUTRIN XL) 300 MG 24 hr tablet, Take 1 tablet (300 mg total) by mouth daily., Disp: 90 tablet, Rfl: 1 .  cetirizine (ZYRTEC) 10 MG tablet, Take 1 tablet (10 mg total) by mouth daily as needed for allergies., Disp: 90 tablet, Rfl: 3 .  citalopram (CELEXA) 40 MG  tablet, Take 1.5 tablets (60 mg total) by mouth daily., Disp: 135 tablet, Rfl: 1 .  clonazePAM (KLONOPIN) 0.5 MG tablet, TAKE 1 TABLET BY MOUTH DAILY AS NEEDED FOR ANIXETY, Disp: 30 tablet, Rfl: 0 .  fluticasone (FLONASE) 50 MCG/ACT nasal spray, 1 spray by Each Nare route daily., Disp: 16 g, Rfl: 12 .  ibuprofen (GOODSENSE IBUPROFEN) 200 MG tablet, Take 200 mg by mouth as needed. , Disp: , Rfl:  .  spironolactone (ALDACTONE) 100 MG tablet, Take 1 tablet (100 mg total) by mouth 2 (two) times daily., Disp: 180 tablet, Rfl: 1 .  thyroid (ARMOUR THYROID) 60 MG tablet, TAKE 1 TABLET BY MOUTH EVERY DAY BEFORE BREAKFAST, Disp: 30 tablet, Rfl: 0 .  triamcinolone ointment (KENALOG) 0.5 %, Apply 1 application topically 2 (two) times daily., Disp: 30 g, Rfl: 0   Family History  Problem Relation Age of Onset  . Diabetes Mother   . Anemia Mother   . Hyperlipidemia Mother   . Hypertension Father   . Cancer Father   . Celiac disease Sister   . Diabetes Maternal Grandmother   . Stroke Maternal Grandmother   . Heart attack Maternal Grandmother   . Heart disease Paternal Grandfather      Social History   Tobacco Use  . Smoking status: Never Smoker  . Smokeless tobacco: Never Used  Substance Use Topics  .  Alcohol use: Yes    Comment: On occasion  . Drug use: No    Allergies as of 11/01/2018 - Review Complete 11/01/2018  Allergen Reaction Noted  . Aleve [naproxen sodium] Other (See Comments) 11/08/2016  . Statins Other (See Comments) 07/25/2017    Review of Systems:    All systems reviewed and negative except where noted in HPI.   Physical Exam:  BP 134/67 (BP Location: Right Arm, Patient Position: Sitting, Cuff Size: Normal)   Pulse 87   Ht 5\' 4"  (1.626 m)   Wt 205 lb 3.2 oz (93.1 kg)   LMP  (LMP Unknown)   BMI 35.22 kg/m  No LMP recorded (lmp unknown). Patient is postmenopausal.  General:   Alert,  Well-developed, well-nourished, pleasant and cooperative in NAD Head:   Normocephalic and atraumatic. Eyes:  Sclera clear, no icterus.   Conjunctiva pink. Ears:  Normal auditory acuity. Nose:  No deformity, discharge, or lesions. Mouth:  No deformity or lesions,oropharynx pink & moist. Neck:  Supple; no masses or thyromegaly. Lungs:  Respirations even and unlabored.  Clear throughout to auscultation.   No wheezes, crackles, or rhonchi. No acute distress. Heart:  Regular rate and rhythm; no murmurs, clicks, rubs, or gallops. Abdomen:  Normal bowel sounds. Soft, non-tender and non-distended without masses, hepatosplenomegaly or hernias noted.  No guarding or rebound tenderness.   Rectal: Not performed Msk:  Symmetrical without gross deformities. Good, equal movement & strength bilaterally. Pulses:  Normal pulses noted. Extremities:  No clubbing or edema.  No cyanosis. Neurologic:  Alert and oriented x3;  grossly normal neurologically. Skin:  Intact without significant lesions or rashes. No jaundice. Psych:  Alert and cooperative. Normal mood and affect.  Imaging Studies: Reviewed  Assessment and Plan:   Tracie Sheppard is a 63 y.o. female with obesity, anxiety seen in consultation for throat discomfort, difficulty swallowing.  Her symptoms are mostly functional and she does have history of somatic dysfunction manifesting in other parts of the body as well.  I discussed in length with her management of functional dysphagia.  However, given her age I recommend upper endoscopy with esophageal biopsies.  Suggested her to try omeprazole 20 mg 1-2 times daily for 3 to 4 weeks She is also due for screening colonoscopy and patient is agreeable to undergo along with EGD She said she will call my office to schedule the above procedures after looking into her daughter's schedule   Follow up in 1 to 2 months   Tracie Darby, MD

## 2018-11-07 ENCOUNTER — Telehealth: Payer: Self-pay | Admitting: Gastroenterology

## 2018-11-07 ENCOUNTER — Other Ambulatory Visit: Payer: Self-pay

## 2018-11-07 DIAGNOSIS — Z1211 Encounter for screening for malignant neoplasm of colon: Secondary | ICD-10-CM

## 2018-11-07 DIAGNOSIS — R1310 Dysphagia, unspecified: Secondary | ICD-10-CM

## 2018-11-07 NOTE — Telephone Encounter (Signed)
Pt's procedure has been scheduled for 11/16/2018, pt has been notified and verbalized understanding

## 2018-11-07 NOTE — Telephone Encounter (Signed)
Pt is calling for Tracie Sheppard to see if Friday August 14th was still available for colonoscopy and Endoscopy

## 2018-11-08 ENCOUNTER — Other Ambulatory Visit: Payer: Self-pay | Admitting: Family Medicine

## 2018-11-13 ENCOUNTER — Other Ambulatory Visit
Admission: RE | Admit: 2018-11-13 | Discharge: 2018-11-13 | Disposition: A | Discharge status: Home / Self Care | Payer: BC Managed Care – PPO | Source: Ambulatory Visit | Attending: Gastroenterology | Admitting: Gastroenterology

## 2018-11-13 ENCOUNTER — Other Ambulatory Visit: Payer: Self-pay

## 2018-11-13 DIAGNOSIS — Z01812 Encounter for preprocedural laboratory examination: Secondary | ICD-10-CM | POA: Insufficient documentation

## 2018-11-13 DIAGNOSIS — Z20828 Contact with and (suspected) exposure to other viral communicable diseases: Secondary | ICD-10-CM | POA: Diagnosis not present

## 2018-11-13 LAB — SARS CORONAVIRUS 2 (TAT 6-24 HRS): SARS Coronavirus 2: NEGATIVE

## 2018-11-14 ENCOUNTER — Ambulatory Visit: Payer: BC Managed Care – PPO | Admitting: Gastroenterology

## 2018-11-15 ENCOUNTER — Telehealth: Payer: Self-pay | Admitting: Gastroenterology

## 2018-11-15 NOTE — Telephone Encounter (Signed)
Spoke with pt and explained procedure instructions, pt verbalized understanding  

## 2018-11-15 NOTE — Telephone Encounter (Signed)
Patient has procedures tomorrow & has questions of prep.

## 2018-11-16 ENCOUNTER — Ambulatory Visit: Payer: BC Managed Care – PPO | Admitting: Certified Registered"

## 2018-11-16 ENCOUNTER — Other Ambulatory Visit: Payer: Self-pay

## 2018-11-16 ENCOUNTER — Ambulatory Visit
Admission: RE | Admit: 2018-11-16 | Discharge: 2018-11-16 | Disposition: A | Discharge status: Home / Self Care | Payer: BC Managed Care – PPO | Attending: Gastroenterology | Admitting: Gastroenterology

## 2018-11-16 ENCOUNTER — Encounter
Admission: RE | Disposition: A | Discharge status: Home / Self Care | Payer: Self-pay | Source: Home / Self Care | Attending: Gastroenterology

## 2018-11-16 DIAGNOSIS — Z87442 Personal history of urinary calculi: Secondary | ICD-10-CM | POA: Diagnosis not present

## 2018-11-16 DIAGNOSIS — K30 Functional dyspepsia: Secondary | ICD-10-CM

## 2018-11-16 DIAGNOSIS — G709 Myoneural disorder, unspecified: Secondary | ICD-10-CM | POA: Diagnosis not present

## 2018-11-16 DIAGNOSIS — D123 Benign neoplasm of transverse colon: Secondary | ICD-10-CM | POA: Insufficient documentation

## 2018-11-16 DIAGNOSIS — Z8249 Family history of ischemic heart disease and other diseases of the circulatory system: Secondary | ICD-10-CM | POA: Diagnosis not present

## 2018-11-16 DIAGNOSIS — K3189 Other diseases of stomach and duodenum: Secondary | ICD-10-CM | POA: Diagnosis not present

## 2018-11-16 DIAGNOSIS — E079 Disorder of thyroid, unspecified: Secondary | ICD-10-CM | POA: Insufficient documentation

## 2018-11-16 DIAGNOSIS — Z833 Family history of diabetes mellitus: Secondary | ICD-10-CM | POA: Diagnosis not present

## 2018-11-16 DIAGNOSIS — Z9049 Acquired absence of other specified parts of digestive tract: Secondary | ICD-10-CM | POA: Insufficient documentation

## 2018-11-16 DIAGNOSIS — K922 Gastrointestinal hemorrhage, unspecified: Secondary | ICD-10-CM | POA: Diagnosis not present

## 2018-11-16 DIAGNOSIS — G8929 Other chronic pain: Secondary | ICD-10-CM | POA: Diagnosis not present

## 2018-11-16 DIAGNOSIS — Z888 Allergy status to other drugs, medicaments and biological substances status: Secondary | ICD-10-CM | POA: Diagnosis not present

## 2018-11-16 DIAGNOSIS — Z79899 Other long term (current) drug therapy: Secondary | ICD-10-CM | POA: Diagnosis not present

## 2018-11-16 DIAGNOSIS — M549 Dorsalgia, unspecified: Secondary | ICD-10-CM | POA: Diagnosis not present

## 2018-11-16 DIAGNOSIS — E119 Type 2 diabetes mellitus without complications: Secondary | ICD-10-CM | POA: Diagnosis not present

## 2018-11-16 DIAGNOSIS — G5603 Carpal tunnel syndrome, bilateral upper limbs: Secondary | ICD-10-CM | POA: Insufficient documentation

## 2018-11-16 DIAGNOSIS — F419 Anxiety disorder, unspecified: Secondary | ICD-10-CM | POA: Insufficient documentation

## 2018-11-16 DIAGNOSIS — Z809 Family history of malignant neoplasm, unspecified: Secondary | ICD-10-CM | POA: Insufficient documentation

## 2018-11-16 DIAGNOSIS — K2960 Other gastritis without bleeding: Secondary | ICD-10-CM | POA: Diagnosis not present

## 2018-11-16 DIAGNOSIS — R1310 Dysphagia, unspecified: Secondary | ICD-10-CM

## 2018-11-16 DIAGNOSIS — Z8379 Family history of other diseases of the digestive system: Secondary | ICD-10-CM | POA: Insufficient documentation

## 2018-11-16 DIAGNOSIS — I10 Essential (primary) hypertension: Secondary | ICD-10-CM | POA: Insufficient documentation

## 2018-11-16 DIAGNOSIS — K635 Polyp of colon: Secondary | ICD-10-CM | POA: Diagnosis not present

## 2018-11-16 DIAGNOSIS — Z832 Family history of diseases of the blood and blood-forming organs and certain disorders involving the immune mechanism: Secondary | ICD-10-CM | POA: Diagnosis not present

## 2018-11-16 DIAGNOSIS — Z1211 Encounter for screening for malignant neoplasm of colon: Secondary | ICD-10-CM | POA: Diagnosis present

## 2018-11-16 DIAGNOSIS — K254 Chronic or unspecified gastric ulcer with hemorrhage: Secondary | ICD-10-CM | POA: Diagnosis not present

## 2018-11-16 DIAGNOSIS — F329 Major depressive disorder, single episode, unspecified: Secondary | ICD-10-CM | POA: Diagnosis not present

## 2018-11-16 DIAGNOSIS — Z823 Family history of stroke: Secondary | ICD-10-CM | POA: Insufficient documentation

## 2018-11-16 DIAGNOSIS — Z886 Allergy status to analgesic agent status: Secondary | ICD-10-CM | POA: Diagnosis not present

## 2018-11-16 HISTORY — PX: COLONOSCOPY WITH PROPOFOL: SHX5780

## 2018-11-16 HISTORY — PX: ESOPHAGOGASTRODUODENOSCOPY (EGD) WITH PROPOFOL: SHX5813

## 2018-11-16 SURGERY — ESOPHAGOGASTRODUODENOSCOPY (EGD) WITH PROPOFOL
Anesthesia: General

## 2018-11-16 MED ORDER — LIDOCAINE HCL (CARDIAC) PF 100 MG/5ML IV SOSY
PREFILLED_SYRINGE | INTRAVENOUS | Status: DC | PRN
  Administered 2018-11-16: 100 mg via INTRATRACHEAL

## 2018-11-16 MED ORDER — PROPOFOL 10 MG/ML IV BOLUS
INTRAVENOUS | Status: DC | PRN
  Administered 2018-11-16 (×10): 50 mg via INTRAVENOUS

## 2018-11-16 MED ORDER — PHENYLEPHRINE HCL (PRESSORS) 10 MG/ML IV SOLN
INTRAVENOUS | Status: DC | PRN
  Administered 2018-11-16: 100 ug via INTRAVENOUS

## 2018-11-16 MED ORDER — SODIUM CHLORIDE 0.9 % IV SOLN
INTRAVENOUS | Status: DC
  Administered 2018-11-16: 10:00:00 via INTRAVENOUS

## 2018-11-16 MED ORDER — LIDOCAINE HCL (PF) 1 % IJ SOLN
INTRAMUSCULAR | Status: AC
  Administered 2018-11-16: 10:00:00
  Filled 2018-11-16: qty 2

## 2018-11-16 MED ORDER — LIDOCAINE HCL (PF) 1 % IJ SOLN
INTRAMUSCULAR | Status: AC
  Filled 2018-11-16: qty 2

## 2018-11-16 NOTE — Anesthesia Preprocedure Evaluation (Signed)
Anesthesia Evaluation  Patient identified by MRN, date of birth, ID band Patient awake    Reviewed: Allergy & Precautions, H&P , NPO status , Patient's Chart, lab work & pertinent test results, reviewed documented beta blocker date and time   Airway Mallampati: II   Neck ROM: full    Dental  (+) Poor Dentition   Pulmonary neg pulmonary ROS,    Pulmonary exam normal        Cardiovascular Exercise Tolerance: Good hypertension, On Medications negative cardio ROS Normal cardiovascular exam Rhythm:regular Rate:Normal     Neuro/Psych PSYCHIATRIC DISORDERS Anxiety Depression  Neuromuscular disease negative neurological ROS  negative psych ROS   GI/Hepatic negative GI ROS, Neg liver ROS,   Endo/Other  negative endocrine ROSdiabetes  Renal/GU Renal diseasenegative Renal ROS  negative genitourinary   Musculoskeletal   Abdominal   Peds  Hematology negative hematology ROS (+)   Anesthesia Other Findings Past Medical History: No date: Anxiety 05/30/2017: Bilateral carpal tunnel syndrome No date: Chronic back pain No date: Diabetes mellitus without complication (HCC)     Comment:  Prediabetic No date: High serum testosterone No date: Kidney stone No date: Thyroid disease     Comment:  did not show up in lab work but per other symptoms               previous doctor started her on this Past Surgical History: No date: CHOLECYSTECTOMY     Comment:  1997 No date: NASAL SEPTUM SURGERY No date: OOPHORECTOMY No date: TUBAL LIGATION     Comment:  1989 BMI    Body Mass Index: 33.46 kg/m     Reproductive/Obstetrics negative OB ROS                             Anesthesia Physical Anesthesia Plan  ASA: III  Anesthesia Plan: General   Post-op Pain Management:    Induction:   PONV Risk Score and Plan:   Airway Management Planned:   Additional Equipment:   Intra-op Plan:   Post-operative  Plan:   Informed Consent: I have reviewed the patients History and Physical, chart, labs and discussed the procedure including the risks, benefits and alternatives for the proposed anesthesia with the patient or authorized representative who has indicated his/her understanding and acceptance.     Dental Advisory Given  Plan Discussed with: CRNA  Anesthesia Plan Comments:         Anesthesia Quick Evaluation

## 2018-11-16 NOTE — H&P (Signed)
Cephas Darby, MD [address]  [place]  [place], [place] [address]  Main: [phone]  Fax: [phone] Pager: [phone]  Primary Care Physician:  Valerie Roys, DO Primary Gastroenterologist:  Dr. Cephas Darby  Pre-Procedure History & Physical: HPI:  Tracie Sheppard is a 63 y.o. female is here for an endoscopy and colonoscopy.   Past Medical History:  Diagnosis Date  . Anxiety   . Bilateral carpal tunnel syndrome 05/30/2017  . Chronic back pain   . Diabetes mellitus without complication ([facility])    Prediabetic  . High serum testosterone   . Kidney stone   . Thyroid disease    did not show up in lab work but per other symptoms previous doctor started her on this    Past Surgical History:  Procedure Laterality Date  . CHOLECYSTECTOMY     1997  . NASAL SEPTUM SURGERY    . OOPHORECTOMY    . TUBAL LIGATION     1989    Prior to Admission medications   Medication Sig Start Date End Date Taking? Authorizing Provider  buPROPion (WELLBUTRIN XL) 300 MG 24 hr tablet Take 1 tablet (300 mg total) by mouth daily. 08/16/18   Johnson, Megan P, DO  cetirizine (ZYRTEC) 10 MG tablet Take 1 tablet (10 mg total) by mouth daily as needed for allergies. 07/06/17   Johnson, Megan P, DO  citalopram (CELEXA) 40 MG tablet Take 1.5 tablets (60 mg total) by mouth daily. 02/27/18   Johnson, Megan P, DO  clonazePAM (KLONOPIN) 0.5 MG tablet TAKE 1 TABLET BY MOUTH DAILY AS NEEDED FOR ANIXETY 04/05/18   Johnson, Megan P, DO  fluticasone (FLONASE) 50 MCG/ACT nasal spray 1 spray by Each Nare route daily. 02/27/18   Johnson, Megan P, DO  ibuprofen (GOODSENSE IBUPROFEN) 200 MG tablet Take 200 mg by mouth as needed.     [provider]  spironolactone (ALDACTONE) 100 MG tablet Take 1 tablet (100 mg total) by mouth 2 (two) times daily. 02/27/18   Johnson, Megan P, DO  thyroid (ARMOUR THYROID) 60 MG tablet TAKE 1 TABLET BY MOUTH EVERY DAY BEFORE BREAKFAST 11/08/18   Johnson, Megan P, DO   triamcinolone ointment (KENALOG) 0.5 % Apply 1 application topically 2 (two) times daily. 10/04/18   Park Liter P, DO    Allergies as of 11/07/2018 - Review Complete 11/01/2018  Allergen Reaction Noted  . Aleve [naproxen sodium] Other (See Comments) 11/08/2016  . Statins Other (See Comments) 07/25/2017    Family History  Problem Relation Age of Onset  . Diabetes Mother   . Anemia Mother   . Hyperlipidemia Mother   . Hypertension Father   . Cancer Father   . Celiac disease Sister   . Diabetes Maternal Grandmother   . Stroke Maternal Grandmother   . Heart attack Maternal Grandmother   . Heart disease Paternal Grandfather     Social History   Socioeconomic History  . Marital status: Married    Spouse name: Not on file  . Number of children: 1  . Years of education: 71  . Highest education level: Not on file  Occupational History  . Occupation: unemployed  Social Needs  . Financial resource strain: Not on file  . Food insecurity    Worry: Not on file    Inability: Not on file  . Transportation needs    Medical: Not on file    Non-medical: Not on file  Tobacco Use  . Smoking status: Never  Smoker  . Smokeless tobacco: Never Used  Substance and Sexual Activity  . Alcohol use: Yes    Comment: On occasion  . Drug use: No  . Sexual activity: Yes    Birth control/protection: None  Lifestyle  . Physical activity    Days per week: Not on file    Minutes per session: Not on file  . Stress: Not on file  Relationships  . Social Herbalist on phone: Not on file    Gets together: Not on file    Attends religious service: Not on file    Active member of club or organization: Not on file    Attends meetings of clubs or organizations: Not on file    Relationship status: Not on file  . Intimate partner violence    Fear of current or ex partner: Not on file    Emotionally abused: Not on file    Physically abused: Not on file    Forced sexual activity: Not on  file  Other Topics Concern  . Not on file  Social History Narrative   Lives with spouse   Caffeine use: 1-2 drinks per day   Right handed     Review of Systems: See HPI, otherwise negative ROS  Physical Exam: BP 135/77   Pulse 79   Temp (!) 96.6 F (35.9 C) (Tympanic)   Resp 16   Ht 5' 4.5" (1.638 m)   Wt 89.8 kg   LMP  (LMP Unknown)   SpO2 98%   BMI 33.46 kg/m  General:   Alert,  pleasant and cooperative in NAD Head:  Normocephalic and atraumatic. Neck:  Supple; no masses or thyromegaly. Lungs:  Clear throughout to auscultation.    Heart:  Regular rate and rhythm. Abdomen:  Soft, nontender and nondistended. Normal bowel sounds, without guarding, and without rebound.   Neurologic:  Alert and  oriented x4;  grossly normal neurologically.  Impression/Plan: Tracie Sheppard is here for an endoscopy and colonoscopy to be performed for dysphagia and colon cancer screening  Risks, benefits, limitations, and alternatives regarding  endoscopy and colonoscopy have been reviewed with the patient.  Questions have been answered.  All parties agreeable.   Sherri Sear, MD  11/16/2018, 9:57 AM

## 2018-11-16 NOTE — Op Note (Signed)
[facility] Gastroenterology Patient Name: Tracie Sheppard Procedure Date: 11/16/2018 9:53 AM MRN: [id number] Account #: [id number] Date of Birth: 06-Oct-1955 Admit Type: Outpatient Age: 63 Room: [facility] ENDO ROOM 3 Gender: Female Note Status: Finalized Procedure:            Upper GI endoscopy Indications:          Indigestion, Dysphagia Providers:            Lin Landsman MD, MD Referring MD:         Valerie Roys (Referring MD) Medicines:            Monitored Anesthesia Care Complications:        No immediate complications. Estimated blood loss: None. Procedure:            Pre-Anesthesia Assessment:                       - Prior to the procedure, a History and Physical was                        performed, and patient medications and allergies were                        reviewed. The patient is competent. The risks and                        benefits of the procedure and the sedation options and                        risks were discussed with the patient. All questions                        were answered and informed consent was obtained.                        Patient identification and proposed procedure were                        verified by the physician, the nurse, the                        anesthesiologist, the anesthetist and the technician in                        the pre-procedure area in the procedure room in the                        endoscopy suite. Mental Status Examination: alert and                        oriented. Airway Examination: normal oropharyngeal                        airway and neck mobility. Respiratory Examination:                        clear to auscultation. CV Examination: normal. ASA                        Grade Assessment: III - A patient with severe systemic  disease. After reviewing the risks and benefits, the                        patient was deemed in satisfactory condition to undergo               the procedure. The anesthesia plan was to use monitored                        anesthesia care (MAC). Immediately prior to                        administration of medications, the patient was                        re-assessed for adequacy to receive sedatives. The                        heart rate, respiratory rate, oxygen saturations, blood                        pressure, adequacy of pulmonary ventilation, and                        response to care were monitored throughout the                        procedure. The physical status of the patient was                        re-assessed after the procedure.                       After obtaining informed consent, the endoscope was                        passed under direct vision. Throughout the procedure,                        the patient's blood pressure, pulse, and oxygen                        saturations were monitored continuously. The Endoscope                        was introduced through the mouth, and advanced to the                        second part of duodenum. The upper GI endoscopy was                        accomplished without difficulty. The patient tolerated                        the procedure well. Findings:      The duodenal bulb and second portion of the duodenum were normal.      Multiple dispersed, diminutive non-bleeding erosions were found at the       incisura and in the gastric antrum. There were stigmata of recent       bleeding. Biopsies were taken with a cold forceps for Helicobacter  pylori testing.      The cardia and gastric fundus were normal on retroflexion.      Esophagogastric landmarks were identified: the gastroesophageal junction       was found at 38 cm from the incisors.      The gastroesophageal junction and examined esophagus were normal.       Biopsies were taken with a cold forceps for histology. Impression:           - Normal duodenal bulb and second portion of the                         duodenum.                       - Recently bleeding erosive gastropathy. Biopsied.                       - Esophagogastric landmarks identified.                       - Normal gastroesophageal junction and esophagus.                        Biopsied. Recommendation:       - Await pathology results.                       - Continue present medications.                       - Proceed with colonoscopy as scheduled                       See colonoscopy report Procedure Code(s):    --- Professional ---                       [phone], Esophagogastroduodenoscopy, flexible, transoral;                        with biopsy, single or multiple Diagnosis Code(s):    --- Professional ---                       K31.89, Other diseases of stomach and duodenum                       K30, Functional dyspepsia                       R13.10, Dysphagia, unspecified                       K92.2, Gastrointestinal hemorrhage, unspecified CPT copyright 2019 American Medical Association. All rights reserved. The codes documented in this report are preliminary and upon coder review may  be revised to meet current compliance requirements. Dr. Ulyess Mort Lin Landsman MD, MD 11/16/2018 10:37:54 AM This report has been signed electronically. Number of Addenda: 0 Note Initiated On: 11/16/2018 9:53 AM Estimated Blood Loss: Estimated blood loss: none.      [facility]

## 2018-11-16 NOTE — Anesthesia Post-op Follow-up Note (Signed)
Anesthesia QCDR form completed.        

## 2018-11-16 NOTE — Transfer of Care (Signed)
Immediate Anesthesia Transfer of Care Note  Patient: Tracie Sheppard  Procedure(s) Performed: ESOPHAGOGASTRODUODENOSCOPY (EGD) WITH PROPOFOL (N/A ) COLONOSCOPY WITH PROPOFOL (N/A )  Patient Location: Endoscopy Unit  Anesthesia Type:General  Level of Consciousness: awake, drowsy and patient cooperative  Airway & Oxygen Therapy: Patient Spontanous Breathing and Patient connected to face mask oxygen  Post-op Assessment: Report given to RN and Post -op Vital signs reviewed and stable  Post vital signs: Reviewed and stable  Last Vitals:  Vitals Value Taken Time  BP 119/70 11/16/18 1106  Temp 36.3 C 11/16/18 1101  Pulse 78 11/16/18 1106  Resp 16 11/16/18 1106  SpO2 100 % 11/16/18 1106  Vitals shown include unvalidated device data.  Last Pain:  Vitals:   11/16/18 1101  TempSrc: Tympanic  PainSc: Asleep         Complications: No apparent anesthesia complications

## 2018-11-16 NOTE — Op Note (Signed)
[facility] Gastroenterology Patient Name: Tracie Sheppard Procedure Date: 11/16/2018 9:52 AM MRN: [id number] Account #: [id number] Date of Birth: Apr 28, 1955 Admit Type: Outpatient Age: 63 Room: [facility] ENDO ROOM 3 Gender: Female Note Status: Finalized Procedure:            Colonoscopy Indications:          Screening for colorectal malignant neoplasm, This is                        the patient's first colonoscopy Providers:            Lin Landsman MD, MD Referring MD:         Valerie Roys (Referring MD) Medicines:            Monitored Anesthesia Care Complications:        No immediate complications. Estimated blood loss: None. Procedure:            Pre-Anesthesia Assessment:                       - Prior to the procedure, a History and Physical was                        performed, and patient medications and allergies were                        reviewed. The patient is competent. The risks and                        benefits of the procedure and the sedation options and                        risks were discussed with the patient. All questions                        were answered and informed consent was obtained.                        Patient identification and proposed procedure were                        verified by the physician, the nurse, the                        anesthesiologist, the anesthetist and the technician in                        the pre-procedure area in the procedure room in the                        endoscopy suite. Mental Status Examination: alert and                        oriented. Airway Examination: normal oropharyngeal                        airway and neck mobility. Respiratory Examination:                        clear to auscultation. CV Examination: normal. ASA  Grade Assessment: III - A patient with severe systemic                        disease. After reviewing the risks and benefits, the              patient was deemed in satisfactory condition to undergo                        the procedure. The anesthesia plan was to use monitored                        anesthesia care (MAC). Immediately prior to                        administration of medications, the patient was                        re-assessed for adequacy to receive sedatives. The                        heart rate, respiratory rate, oxygen saturations, blood                        pressure, adequacy of pulmonary ventilation, and                        response to care were monitored throughout the                        procedure. The physical status of the patient was                        re-assessed after the procedure.                       After obtaining informed consent, the colonoscope was                        passed under direct vision. Throughout the procedure,                        the patient's blood pressure, pulse, and oxygen                        saturations were monitored continuously. The                        Colonoscope was introduced through the anus and                        advanced to the the cecum, identified by appendiceal                        orifice and ileocecal valve. The colonoscopy was                        performed with moderate difficulty due to significant                        looping and the patient's body habitus. Successful  completion of the procedure was aided by applying                        abdominal pressure. The quality of the bowel                        preparation was good. Findings:      The perianal and digital rectal examinations were normal. Pertinent       negatives include normal sphincter tone and no palpable rectal lesions.      A 3 mm polyp was found in the transverse colon. The polyp was sessile.       The polyp was removed with a cold biopsy forceps. Resection and       retrieval were complete.      The retroflexed view of  the distal rectum and anal verge was normal and       showed no anal or rectal abnormalities. Impression:           - One 3 mm polyp in the transverse colon, removed with                        a cold biopsy forceps. Resected and retrieved.                       - The distal rectum and anal verge are normal on                        retroflexion view. Recommendation:       - Discharge patient to home (with spouse).                       - Resume previous diet today.                       - Continue present medications.                       - Await pathology results.                       - Repeat colonoscopy in 5-10 years for surveillance                        based on pathology results. Procedure Code(s):    --- Professional ---                       [phone], Colonoscopy, flexible; with biopsy, single or                        multiple Diagnosis Code(s):    --- Professional ---                       Z12.11, Encounter for screening for malignant neoplasm                        of colon                       K63.5, Polyp of colon CPT copyright 2019 American Medical Association. All rights reserved. The codes documented in this report are preliminary and upon coder review  may  be revised to meet current compliance requirements. Dr. Ulyess Mort Lin Landsman MD, MD 11/16/2018 10:59:21 AM This report has been signed electronically. Number of Addenda: 0 Note Initiated On: 11/16/2018 9:52 AM Scope Withdrawal Time: 0 hours 9 minutes 9 seconds  Total Procedure Duration: 0 hours 15 minutes 23 seconds  Estimated Blood Loss: Estimated blood loss: none.      [facility]

## 2018-11-17 ENCOUNTER — Encounter: Payer: Self-pay | Admitting: Gastroenterology

## 2018-11-19 ENCOUNTER — Encounter: Payer: Self-pay | Admitting: Gastroenterology

## 2018-11-19 LAB — SURGICAL PATHOLOGY

## 2018-11-20 ENCOUNTER — Encounter: Payer: Self-pay | Admitting: Gastroenterology

## 2018-11-20 ENCOUNTER — Ambulatory Visit: Payer: BC Managed Care – PPO | Admitting: Gastroenterology

## 2018-11-21 ENCOUNTER — Ambulatory Visit: Payer: BC Managed Care – PPO | Admitting: Family Medicine

## 2018-11-21 NOTE — Anesthesia Postprocedure Evaluation (Signed)
Anesthesia Post Note  Patient: Tracie Sheppard  Procedure(s) Performed: ESOPHAGOGASTRODUODENOSCOPY (EGD) WITH PROPOFOL (N/A ) COLONOSCOPY WITH PROPOFOL (N/A )  Patient location during evaluation: PACU Anesthesia Type: General Level of consciousness: awake and alert Pain management: pain level controlled Vital Signs Assessment: post-procedure vital signs reviewed and stable Respiratory status: spontaneous breathing, nonlabored ventilation, respiratory function stable and patient connected to nasal cannula oxygen Cardiovascular status: blood pressure returned to baseline and stable Postop Assessment: no apparent nausea or vomiting Anesthetic complications: no     Last Vitals:  Vitals:   11/16/18 1121 11/16/18 1131  BP: 117/61 99/84  Pulse: 72 68  Resp: 13 19  Temp:    SpO2: 100% 100%    Last Pain:  Vitals:   11/16/18 1131  TempSrc:   PainSc: 0-No pain                 Molli Barrows

## 2018-11-28 ENCOUNTER — Ambulatory Visit: Payer: BC Managed Care – PPO | Admitting: Gastroenterology

## 2018-11-28 ENCOUNTER — Encounter

## 2018-11-29 ENCOUNTER — Ambulatory Visit (INDEPENDENT_AMBULATORY_CARE_PROVIDER_SITE_OTHER): Payer: BC Managed Care – PPO | Admitting: Family Medicine

## 2018-11-29 ENCOUNTER — Other Ambulatory Visit: Payer: Self-pay

## 2018-11-29 ENCOUNTER — Encounter: Payer: Self-pay | Admitting: Family Medicine

## 2018-11-29 VITALS — BP 135/77 | HR 93 | Temp 98.9°F

## 2018-11-29 DIAGNOSIS — R0683 Snoring: Secondary | ICD-10-CM | POA: Diagnosis not present

## 2018-11-29 DIAGNOSIS — M545 Low back pain, unspecified: Secondary | ICD-10-CM

## 2018-11-29 DIAGNOSIS — M9901 Segmental and somatic dysfunction of cervical region: Secondary | ICD-10-CM

## 2018-11-29 DIAGNOSIS — M9900 Segmental and somatic dysfunction of head region: Secondary | ICD-10-CM

## 2018-11-29 DIAGNOSIS — M9907 Segmental and somatic dysfunction of upper extremity: Secondary | ICD-10-CM

## 2018-11-29 DIAGNOSIS — M9903 Segmental and somatic dysfunction of lumbar region: Secondary | ICD-10-CM

## 2018-11-29 DIAGNOSIS — M79601 Pain in right arm: Secondary | ICD-10-CM | POA: Diagnosis not present

## 2018-11-29 DIAGNOSIS — M9905 Segmental and somatic dysfunction of pelvic region: Secondary | ICD-10-CM

## 2018-11-29 DIAGNOSIS — M9908 Segmental and somatic dysfunction of rib cage: Secondary | ICD-10-CM

## 2018-11-29 DIAGNOSIS — M9904 Segmental and somatic dysfunction of sacral region: Secondary | ICD-10-CM | POA: Diagnosis not present

## 2018-11-29 DIAGNOSIS — M9902 Segmental and somatic dysfunction of thoracic region: Secondary | ICD-10-CM

## 2018-11-29 MED ORDER — SULFAMETHOXAZOLE-TRIMETHOPRIM 800-160 MG PO TABS: 1.0000 | ORAL_TABLET | Freq: Two times a day (BID) | ORAL | 0 refills | Status: DC

## 2018-11-29 NOTE — Patient Instructions (Signed)
Tennis Elbow Rehab Ask your health care provider which exercises are safe for you. Do exercises exactly as told by your health care provider and adjust them as directed. It is normal to feel mild stretching, pulling, tightness, or discomfort as you do these exercises. Stop right away if you feel sudden pain or your pain gets worse. Do not begin these exercises until told by your health care provider. Stretching and range-of-motion exercises These exercises warm up your muscles and joints and improve the movement and flexibility of your elbow. These exercises also help to relieve pain, numbness, and tingling. Wrist flexion, assisted  1. Straighten your left / right elbow in front of you with your palm facing down toward the floor. ? If told by your health care provider, bend your left / right elbow to a 90-degree angle (right angle) at your side. 2. With your other hand, gently push over the back of your left / right hand so your fingers point toward the floor (flexion). Stop when you feel a gentle stretch on the back of your forearm. 3. Hold this position for __________ seconds. Repeat __________ times. Complete this exercise __________ times a day. Wrist extension, assisted  1. Straighten your left / right elbow in front of you with your palm facing up toward the ceiling. ? If told by your health care provider, bend your left / right elbow to a 90-degree angle (right angle) at your side. 2. With your other hand, gently pull your left / right hand and fingers toward the floor (extension). Stop when you feel a gentle stretch on the palm side of your forearm. 3. Hold this position for __________ seconds. Repeat __________ times. Complete this exercise __________ times a day. Assisted forearm rotation, supination 1. Sit or stand with your left / right elbow bent to a 90-degree angle (right angle) at your side. 2. Using your uninjured hand, turn (rotate) your left / right palm up toward the ceiling  (supination) until you feel a gentle stretch along the inside of your forearm. 3. Hold this position for __________ seconds. Repeat __________ times. Complete this exercise __________ times a day. Assisted forearm rotation, pronation 1. Sit or stand with your left / right elbow bent to a 90-degree angle (right angle) at your side. 2. Using your uninjured hand, rotate your left / right palm down toward the floor (pronation) until you feel a gentle stretch along the outside of your forearm. 3. Hold this position for __________ seconds. Repeat __________ times. Complete this exercise __________ times a day. Strengthening exercises These exercises build strength and endurance in your forearm and elbow. Endurance is the ability to use your muscles for a long time, even after they get tired. Radial deviation  1. Stand with a __________ weight or a hammer in your left / right hand. Or, sit while holding a rubber exercise band or tubing, with your left / right forearm supported on a table or countertop. ? If you are standing, position your forearm so that your thumb is facing forward. If you are sitting, position your forearm so that the thumb is facing the ceiling. This is the neutral position. 2. Raise your hand upward in front of you so your thumb moves toward the ceiling (radial deviation), or pull up on the rubber tubing. Keep your forearm and elbow still while you move your wrist only. 3. Hold this position for __________ seconds. 4. Slowly return to the starting position. Repeat __________ times. Complete this exercise __________ times  a day. Wrist extension, eccentric 1. Sit with your left / right forearm palm-down and supported on a table or other surface. Let your left / right wrist extend over the edge of the surface. 2. Hold a __________ weight or a piece of exercise band or tubing in your left / right hand. ? If using a rubber exercise band or tubing, hold the other end of the tubing with  your other hand. 3. Use your uninjured hand to move your left / right hand up toward the ceiling. 4. Take your uninjured hand away and slowly return to the starting position using only your left / right hand. Lowering your arm under tension is called eccentric extension. Repeat __________ times. Complete this exercise __________ times a day. Wrist extension Do not do this exercise if it causes pain at the outside of your elbow. Only do this exercise once instructed by your health care provider. 1. Sit with your left / right forearm supported on a table or other surface and your palm turned down toward the floor. Let your left / right wrist extend over the edge of the surface. 2. Hold a __________ weight or a piece of rubber exercise band or tubing. ? If you are using a rubber exercise band or tubing, hold the band or tubing in place with your other hand to provide resistance. 3. Slowly bend your wrist so your hand moves up toward the ceiling (extension). Move only your wrist, keeping your forearm and elbow still. 4. Hold this position for __________ seconds. 5. Slowly return to the starting position. Repeat __________ times. Complete this exercise __________ times a day. Forearm rotation, supination To do this exercise, you will need a lightweight hammer or rubber mallet. 1. Sit with your left / right forearm supported on a table or other surface. Bend your elbow to a 90-degree angle (right angle). Position your forearm so that your palm is facing down toward the floor, with your hand resting over the edge of the table. 2. Hold a hammer in your left / right hand. ? To make this exercise easier, hold the hammer near the head of the hammer. ? To make this exercise harder, hold the hammer near the end of the handle. 3. Without moving your wrist or elbow, slowly rotate your forearm so your palm faces up toward the ceiling (supination). 4. Hold this position for __________ seconds. 5. Slowly return  to the starting position. Repeat __________ times. Complete this exercise __________ times a day. Shoulder blade squeeze 1. Sit in a stable chair or stand with good posture. If you are sitting down, do not let your back touch the back of the chair. 2. Your arms should be at your sides with your elbows bent to a 90-degree angle (right angle). Position your forearms so that your thumbs are facing the ceiling (neutral position). 3. Without lifting your shoulders up, squeeze your shoulder blades tightly together. 4. Hold this position for __________ seconds. 5. Slowly release and return to the starting position. Repeat __________ times. Complete this exercise __________ times a day. This information is not intended to replace advice given to you by your health care provider. Make sure you discuss any questions you have with your health care provider. Document Released: 03/21/2005 Document Revised: 07/12/2018 Document Reviewed: 05/15/2018 [employer] Patient Education  2020 [place] your health care provider which exercises are safe for you. Do exercises exactly as told by your health care provider and adjust  them as directed. It is normal to feel mild stretching, pulling, tightness, or discomfort as you do these exercises. Stop right away if you feel sudden pain or your pain gets worse. Do not begin these exercises until told by your health care provider. Stretching and range-of-motion exercises These exercises warm up your muscles and joints and improve the movement and flexibility of your wrist. These exercises also help to relieve pain, numbness, and tingling. Wrist flexion range of motion 1. Bend your left / right elbow to a 90-degree angle (right angle) with your palm facing the floor. 2. Bend your wrist forward so your fingers point toward the floor (flexion). 3. Hold this position for __________ seconds. 4. Slowly return to the starting position. Repeat __________  times. Complete this exercise __________ times a day. Wrist extension range of motion 1. Bend your left / right elbow to a 90-degree angle (right angle) with your palm facing the floor. 2. Bend your wrist backward so your fingers point toward the ceiling (extension). 3. Hold this position for __________ seconds. 4. Slowly return to the starting position. Repeat __________ times. Complete this exercise __________ times a day. Ulnar deviation range of motion 1. Bend your left / right elbow to a 90-degree angle (right angle), and rest your forearm on a table with your palm facing down. 2. Keeping your hand flat on the table, bend your left / right wrist toward your small finger (pinkie). This is ulnar deviation. 3. Hold this position for __________ seconds. 4. Slowly return your palm to the starting position. Repeat __________ times. Complete this exercise __________ times a day. Radial deviation range of motion 1. Bend your left / right elbow to a 90-degree angle (right angle), and rest your forearm on a table with your palm facing down. 2. Keeping your hand flat on the table, bend your left / right wrist toward your thumb. This is radial deviation. 3. Hold this position for __________ seconds. 4. Slowly return your palm to the starting position. Repeat __________ times. Complete this exercise __________ times a day. Dart-thrower's motion This exercise combines all four wrist motions-wrist flexion, wrist extension, ulnar deviation, and radial deviation-into one exercise. The motion is similar to throwing a dart. 1. Sit and rest your left / right elbow on a table. Start with your wrist straight. Keep your fingers relaxed during the exercise. 2. In the same motion, bend your wrist backward (extension) and toward your thumb (radial deviation). Move slowly and go as far as you can. 3. Then, in the same motion, bend your wrist forward (flexion) and toward your small (pinkie) finger (ulnar deviation).  Move slowly and go as far as you can. 4. Slowly return to the starting position. Repeat __________ times. Complete this exercise __________ times a day. Wrist flexion stretch  1. Extend your left / right arm in front of you, and turn your palm down toward the floor. ? If told by your health care provider, bend your left / right elbow to a 90-degree angle (right angle) at your side. 2. Using your uninjured hand, gently press over the back of your left / right hand to bend your wrist and fingers toward the floor (flexion). Go as far as you can to feel a stretch without causing pain. 3. Hold this position for __________ seconds. 4. Slowly return to the starting position. Repeat __________ times. Complete this exercise __________ times a day. Wrist extension stretch  1. Extend your left / right arm in front of you  and turn your palm up toward the ceiling. ? If told by your health care provider, bend your left / right elbow to a 90-degree angle (right angle) at your side. 2. Using your uninjured hand, gently press over the palm of your left / right hand to bend your wrist and fingers toward the floor (extension). Go as far as you can to feel a stretch without causing pain. 3. Hold this position for __________ seconds. 4. Slowly return to the starting position. Repeat __________ times. Complete this exercise __________ times a day. Ulnar deviation stretch 1. Bend your left / right elbow to a 90-degree angle (right angle). Rest your forearm, wrist, and hand on a table with your palm facing the floor. 2. Place your uninjured hand over the top of your left / right hand to keep it from moving during the exercise. 3. Gently move your left / right elbow away from your body. This will turn your wrist toward your small finger (pinkie). This is ulnar deviation. 4. Hold this position for __________ seconds. 5. Slowly return to the starting position. Repeat __________ times. Complete this exercise __________  times a day. Radial deviation stretch 1. Bend your left / right elbow to a 90-degree angle (right angle). Rest your forearm, wrist, and hand on a table with your palm facing the floor. 2. Place your uninjured hand over the top of your left / right hand to keep it from moving during the exercise. 3. Gently move your left / right elbow toward your body. This will turn your wrist toward your thumb (radial deviation). 4. Hold this position for __________ seconds. 5. Slowly return to the starting position. Repeat __________ times. Complete this exercise __________ times a day. Strengthening exercises These exercises build strength and endurance in your wrist. Endurance is the ability to use your muscles for a long time, even after they get tired. Isometric wrist flexion 1. Bend your left / right elbow to a 90-degree angle (right angle). Rest your forearm, wrist, and hand on a table with your palm facing the floor. 2. Without moving any joints (isometric), tighten your muscles as if you are trying to bend (flex) your wrist. ? Start with partial effort and increase your effort as tolerated. 3. Hold this position for __________ seconds. Then relax your muscles. Repeat __________ times. Complete this exercise __________ times a day. Isometric wrist extension 1. Bend your left / right elbow to a 90-degree angle (right angle). Rest your forearm, wrist, and hand on a table with your palm facing the floor. Place your unaffected hand over the top of the left / right hand. 2. Without moving any joints (isometric), tighten your muscles to lift your left / right hand off the table (extension). Use the unaffected hand to keep the left / right hand on the table. ? Start with partial effort and increase your effort as tolerated. 3. Hold this position for __________ seconds. Then relax your muscles. Repeat __________ times. Complete this exercise __________ times a day. Isometric ulnar deviation 1. Bend your left  / right elbow to a 90-degree angle (right angle). Rest your forearm, wrist, and hand on a table with your thumb facing the ceiling (neutral position). Make a light fist with your left / right fingers. 2. Without moving any joints (isometric), tighten your muscles to press the side of your left / right fist into the tabletop (ulnar deviation). ? Start with partial effort and increase your effort as tolerated. 3. Hold this position for  __________ seconds. Then relax your muscles. Repeat __________ times. Complete this exercise __________ times a day. Isometric radial deviation 1. Bend your left / right elbow to a 90-degree angle (right angle). Rest your forearm, wrist, and hand on a table with your thumb facing the ceiling (neutral position). 2. Make a light fist with your left / right fingers. Place your unaffected hand over the top of your fist. 3. Without moving any joints (isometric), tighten your muscles to lift your left / right fist off the table (radial deviation). Use the unaffected hand to keep the left / right hand on the table. ? Start with partial effort and increase your effort as tolerated. 4. Hold this position for __________ seconds. Then relax your muscles. Repeat __________ times. Complete this exercise __________ times a day. Wrist flexion  1. Sit with your left / right forearm supported on a table or other surface. Bend your elbow to a 90-degree angle (right angle), and rest your hand palm-up over the edge of the table. 2. Hold a __________ weight in your left / right hand. Or, hold an exercise band or tube in both hands, keeping your hands at the same level and hip distance apart. There should be a slight tension in the exercise band or tube. 3. Slowly curl your hand up toward the ceiling (flexion). 4. Hold this position for __________ seconds. 5. Slowly lower your hand back to the starting position. Repeat __________ times. Complete this exercise __________ times a day. Wrist  extension  1. Sit with your left / right forearm supported on a table or other surface. Bend your elbow to a 90-degree angle (right angle), and rest your hand palm-down over the edge of the table. 2. Hold a __________ weight in your left / right hand. Or, hold an exercise band or tube in both hands, keeping your hands at the same level and hip distance apart. There should be a slight tension in the exercise band or tube. 3. Slowly curl your hand up toward the ceiling (extension). 4. Hold this position for __________ seconds. 5. Slowly lower your hand back to the starting position. Repeat __________ times. Complete this exercise __________ times a day. This information is not intended to replace advice given to you by your health care provider. Make sure you discuss any questions you have with your health care provider. Document Released: 03/21/2005 Document Revised: 07/09/2018 Document Reviewed: 06/11/2018 [employer] Patient Education  [place].

## 2018-12-01 ENCOUNTER — Encounter: Payer: Self-pay | Admitting: Family Medicine

## 2018-12-01 NOTE — Progress Notes (Signed)
BP 135/77   Pulse 93   Temp 98.9 F (37.2 C)   LMP  (LMP Unknown)   SpO2 97%    Subjective:    Patient ID: Tracie Sheppard, female    DOB: 06-23-1955, 63 y.o.   MRN: [id number]  HPI: Tracie Sheppard is a 63 y.o. female  Chief Complaint  Patient presents with  . Back Pain  . Arm Pain   Adore presents today for evaluation and possible treatment with OMM for back pain and arm pain. She has been following with Dr. Coletta Memos for her wrist and had an MRI- they think the lump is a ganglion cyst and are trying to avoid draining it or doing any surgery as it would likely come back. She notes that her R wrist has been aching and sore. It feels tight and radiates up her R arm. Better with medicine, rest and OMM. Worse with certain movements and using the computer. She also notes that her low back is acting up. She has been caring for her grandson and continues to work at home. She notes that her back has been acting up for a couple of weeks. It is sore and aching. Better with some stretches, heat, medicine and OMT. Worse with sitting a long time and stress. She would like a treatment as it has been a while and she thinks it will help her feel better. She has been under a lot more stress with the pandemic and racial issues locally. She is otherwise doing OK.  ????SLEEP APNEA Sleep apnea status: worse Duration: chronic Satisfied with current treatment?:  Not on anything CPAP use:  no Wakes feeling refreshed:  no Daytime hypersomnolence:  yes Fatigue:  yes Insomnia:  yes Good sleep hygiene:  yes Difficulty falling asleep:  yes Difficulty staying asleep:  yes Snoring bothers bed partner:  yes Observed apnea by bed partner: no Obesity:  yes Hypertension: yes  Pulmonary hypertension:  no Coronary artery disease:  no  Relevant past medical, surgical, family and social history reviewed and updated as indicated. Interim medical history since our last visit reviewed. Allergies and medications  reviewed and updated.  Review of Systems  Constitutional: Negative.   Respiratory: Negative.   Cardiovascular: Negative.   Gastrointestinal: Negative.   Musculoskeletal: Positive for arthralgias, back pain and myalgias. Negative for gait problem, joint swelling, neck pain and neck stiffness.  Skin: Negative.   Neurological: Negative.   Psychiatric/Behavioral: Negative.     Per HPI unless specifically indicated above     Objective:    BP 135/77   Pulse 93   Temp 98.9 F (37.2 C)   LMP  (LMP Unknown)   SpO2 97%   Wt Readings from Last 3 Encounters:  11/16/18 198 lb (89.8 kg)  11/01/18 205 lb 3.2 oz (93.1 kg)  10/29/18 204 lb (92.5 kg)    Physical Exam Vitals signs and nursing note reviewed.  Constitutional:      General: She is not in acute distress.    Appearance: Normal appearance. She is not ill-appearing.  HENT:     Head: Normocephalic and atraumatic.     Right Ear: External ear normal.     Left Ear: External ear normal.     Nose: Nose normal.     Mouth/Throat:     Mouth: Mucous membranes are moist.     Pharynx: Oropharynx is clear.  Eyes:     Extraocular Movements: Extraocular movements intact.     Conjunctiva/sclera: Conjunctivae normal.  Pupils: Pupils are equal, round, and reactive to light.  Neck:     Musculoskeletal: No muscular tenderness.     Vascular: No carotid bruit.  Cardiovascular:     Rate and Rhythm: Normal rate.     Pulses: Normal pulses.  Pulmonary:     Effort: Pulmonary effort is normal. No respiratory distress.  Abdominal:     General: Abdomen is flat. There is no distension.     Palpations: Abdomen is soft. There is no mass.     Tenderness: There is no abdominal tenderness. There is no right CVA tenderness, left CVA tenderness, guarding or rebound.     Hernia: No hernia is present.  Lymphadenopathy:     Cervical: No cervical adenopathy.  Skin:    General: Skin is warm and dry.     Capillary Refill: Capillary refill takes less  than 2 seconds.     Coloration: Skin is not jaundiced or pale.     Findings: No bruising, erythema, lesion or rash.  Neurological:     General: No focal deficit present.     Mental Status: She is alert. Mental status is at baseline.  Psychiatric:        Mood and Affect: Mood normal.        Behavior: Behavior normal.        Thought Content: Thought content normal.        Judgment: Judgment normal.   Musculoskeletal:  Exam found Decreased ROM, Tissue texture changes, Tenderness to palpation and Asymmetry of patient's  head, neck, thorax, ribs, lumbar, pelvis, sacrum and upper extremity Osteopathic Structural Exam:   Head: hypertonic suboccipital muscles, OAESSR  Neck: C3ESRR, trap hypertonic on the R  Thorax: T4-6SLRR, T7-8SRRL  Ribs: Ribs 5-6 locked up on the R  Lumbar: hypertonic QL bilaterally L>R, L3-4SLRR  Pelvis: Anterior L innominate  Sacrum: R on R torsion  Upper Extremity: fascial strain from R wrist into elbow    Results for orders placed or performed during the hospital encounter of 11/16/18  Surgical pathology  Result Value Ref Range   SURGICAL PATHOLOGY      Surgical Pathology CASE: [id number] PATIENT: Gittel Marty Surgical Pathology Report     SPECIMEN SUBMITTED: A. Stomach, random; cbx B. Esophagus, random; cbx C. Colon polyp, transverse; cbx  CLINICAL HISTORY: None provided  PRE-OPERATIVE DIAGNOSIS: Dysphagia R13.10, colon cancer screening Z12.11  POST-OPERATIVE DIAGNOSIS: Gastric erosions, colon polyp     DIAGNOSIS: A.  STOMACH; COLD BIOPSY: - ANTRAL AND OXYNTIC MUCOSA WITH AREAS OF REACTIVE GASTRITIS. - NEGATIVE FOR H. PYLORI, DYSPLASIA, AND MALIGNANCY.  B.  ESOPHAGUS, RANDOM; COLD BIOPSY: - UNREMARKABLE SQUAMOUS MUCOSA. - NEGATIVE FOR INTRAEPITHELIAL EOSINOPHILS, DYSPLASIA, AND MALIGNANCY.  C.  COLON POLYP, TRANSVERSE; COLD BIOPSY: - TUBULAR ADENOMA. - NEGATIVE FOR HIGH-GRADE DYSPLASIA AND MALIGNANCY.  GROSS DESCRIPTION: A.  Labeled: C BX random gastric Received: Formalin Tissue fragment(s): Multiple Size: Aggregate, 0.8 x 0.4 x 0.1 cm Description: Tan soft tissue fragments Entirely submi tted in 1 cassette.  B. Labeled: C BX random esophagus Received: Formalin Tissue fragment(s): Multiple Size: Aggregate, 1.0 x 0.4 x 0.1 cm Description: Pale-tan soft tissue fragments Entirely submitted in 1 cassette.  C. Labeled: C BX polyp transverse colon Received: Formalin Tissue fragment(s): 1 Size: 0.6 cm Description: Tan soft tissue fragment Entirely submitted in 1 cassette.   Final Diagnosis performed by Quay Burow, MD.   Electronically signed 11/19/2018 3:12:18PM The electronic signature indicates that the named Attending Pathologist has evaluated the specimen  Technical component  performed at [facility], [address] [place], [place] [address] Lab: [phone] Dir: Rush Farmer, MD, MMM  Professional component performed at [facility], [address] [facility] [facility], [place], [place], [place] [address] Lab: [phone] Dir: Dellia Nims. Reuel Derby, MD       Assessment & Plan:   Problem List Items Addressed This Visit      Other   Low back pain    Acting up today. She does have somatic dysfunction that appears to be contributing to her symptome. Treated today with good results. Call with any conerns.       Other Visit Diagnoses    Snoring    -  Primary   Would like to do her sleep study- ordered today.   Relevant Orders   Ambulatory referral to Sleep Studies   Right arm pain       Continue to follow with ortho as needed. She does have somatic dysfunction- treated today with good results as below. Exercises and voltaren. Call with concerns   Sacral region somatic dysfunction       Somatic dysfunction of pelvis region       Thoracic segment dysfunction       Rib cage region somatic dysfunction       Cervical somatic dysfunction       Head region somatic dysfunction       Lumbar region somatic  dysfunction       Somatic dysfunction of upper extremities         After verbal consent was obtained, patient was treated today with osteopathic manipulative medicine to the regions of the head, neck, thorax, ribs, lumbar, pelvis, sacrum and upper extremity using the techniques of cranial, myofascial release, counterstrain, muscle energy, HVLA and soft tissue. Areas of compensation relating to her primary pain source also treated. Patient tolerated the procedure well with good objective and good subjective improvement in symptoms. She left the room in good condition. She was advised to stay well hydrated and that she may have some soreness following the procedure. If not improving or worsening, she will call and come in. She will return for reevaluation  in 1-2 months.   Follow up plan: Return in about 4 weeks (around 12/27/2018).

## 2018-12-01 NOTE — Assessment & Plan Note (Signed)
Acting up today. She does have somatic dysfunction that appears to be contributing to her symptome. Treated today with good results. Call with any conerns.

## 2018-12-05 ENCOUNTER — Telehealth: Payer: Self-pay | Admitting: Gastroenterology

## 2018-12-05 NOTE — Telephone Encounter (Signed)
Referral has been faxed to [facility] pt has been notified and verbalized understanding

## 2018-12-05 NOTE — Telephone Encounter (Signed)
Esophageal manometry

## 2018-12-05 NOTE — Telephone Encounter (Signed)
Pt now has the name of the Procedure that Dr. Marius Ditch wanted her to do the Esophageal Manometry she would like to know why she mentioned a Referral to [facility] when it states on our Website that Dr. Allen Norris could do it? Please advise

## 2018-12-05 NOTE — Telephone Encounter (Signed)
Our manometry machine broke and we are no longer doing it  RV

## 2018-12-05 NOTE — Telephone Encounter (Signed)
Pt would like a call from Dr. Marius Ditch to clarify the name of the Procedure she recommended for pt to have done which would not be done by [place]. Please advise

## 2018-12-10 ENCOUNTER — Other Ambulatory Visit: Payer: Self-pay | Admitting: Family Medicine

## 2018-12-11 ENCOUNTER — Ambulatory Visit: Payer: BC Managed Care – PPO | Admitting: Gastroenterology

## 2018-12-25 ENCOUNTER — Encounter: Payer: Self-pay | Admitting: Family Medicine

## 2019-01-01 ENCOUNTER — Encounter: Payer: Self-pay | Admitting: Family Medicine

## 2019-01-01 ENCOUNTER — Ambulatory Visit (INDEPENDENT_AMBULATORY_CARE_PROVIDER_SITE_OTHER): Payer: BC Managed Care – PPO | Admitting: Family Medicine

## 2019-01-01 ENCOUNTER — Other Ambulatory Visit: Payer: Self-pay

## 2019-01-01 VITALS — BP 130/85 | HR 74 | Temp 98.4°F | Ht 64.0 in | Wt 205.0 lb

## 2019-01-01 DIAGNOSIS — M9901 Segmental and somatic dysfunction of cervical region: Secondary | ICD-10-CM

## 2019-01-01 DIAGNOSIS — M9903 Segmental and somatic dysfunction of lumbar region: Secondary | ICD-10-CM | POA: Diagnosis not present

## 2019-01-01 DIAGNOSIS — M79601 Pain in right arm: Secondary | ICD-10-CM | POA: Diagnosis not present

## 2019-01-01 DIAGNOSIS — M9908 Segmental and somatic dysfunction of rib cage: Secondary | ICD-10-CM

## 2019-01-01 DIAGNOSIS — M9900 Segmental and somatic dysfunction of head region: Secondary | ICD-10-CM | POA: Diagnosis not present

## 2019-01-01 DIAGNOSIS — M545 Low back pain, unspecified: Secondary | ICD-10-CM

## 2019-01-01 DIAGNOSIS — M9904 Segmental and somatic dysfunction of sacral region: Secondary | ICD-10-CM

## 2019-01-01 DIAGNOSIS — M9905 Segmental and somatic dysfunction of pelvic region: Secondary | ICD-10-CM | POA: Diagnosis not present

## 2019-01-01 DIAGNOSIS — M9902 Segmental and somatic dysfunction of thoracic region: Secondary | ICD-10-CM

## 2019-01-01 DIAGNOSIS — M9909 Segmental and somatic dysfunction of abdomen and other regions: Secondary | ICD-10-CM

## 2019-01-01 NOTE — Progress Notes (Signed)
BP 130/85   Pulse 74   Temp 98.4 F (36.9 C) (Oral)   Ht 5\' 4"  (1.626 m)   Wt 205 lb (93 kg)   LMP  (LMP Unknown)   SpO2 98%   BMI 35.19 kg/m    Subjective:    Patient ID: Tracie Sheppard, female    DOB: 17-Jan-1956, 63 y.o.   MRN: [id number]  HPI: Tracie Sheppard is a 63 y.o. female  Chief Complaint  Patient presents with  . Back Pain    OMM  . Arm Pain   Recently diagnosed with OSA- to be having her CPAP titration shortly. She is anxious to have that done. She presents today for evaluation and possible treatment with OMT. She has been having pain in her low back and her R arm. She has been being climbed on by her grandson. Pain is aching and sore in nature. Nothing makes it better and certain movements make it worse. Her pain does not radiate. It is pretty constant- better when she's laying down at night. She is otherwise feeling well with no other concerns or complaints at this time.   Relevant past medical, surgical, family and social history reviewed and updated as indicated. Interim medical history since our last visit reviewed. Allergies and medications reviewed and updated.  Review of Systems  Constitutional: Negative.   Respiratory: Negative.   Cardiovascular: Negative.   Gastrointestinal: Negative.   Musculoskeletal: Positive for arthralgias, back pain, myalgias, neck pain and neck stiffness. Negative for gait problem and joint swelling.  Skin: Negative.   Neurological: Negative.   Psychiatric/Behavioral: Negative.     Per HPI unless specifically indicated above     Objective:    BP 130/85   Pulse 74   Temp 98.4 F (36.9 C) (Oral)   Ht 5\' 4"  (1.626 m)   Wt 205 lb (93 kg)   LMP  (LMP Unknown)   SpO2 98%   BMI 35.19 kg/m   Wt Readings from Last 3 Encounters:  01/01/19 205 lb (93 kg)  11/16/18 198 lb (89.8 kg)  11/01/18 205 lb 3.2 oz (93.1 kg)    Physical Exam Vitals signs and nursing note reviewed.  Constitutional:      General: She is not in  acute distress.    Appearance: Normal appearance. She is not ill-appearing.  HENT:     Head: Normocephalic and atraumatic.     Right Ear: External ear normal.     Left Ear: External ear normal.     Nose: Nose normal.     Mouth/Throat:     Mouth: Mucous membranes are moist.     Pharynx: Oropharynx is clear.  Eyes:     Extraocular Movements: Extraocular movements intact.     Conjunctiva/sclera: Conjunctivae normal.     Pupils: Pupils are equal, round, and reactive to light.  Neck:     Musculoskeletal: No muscular tenderness.     Vascular: No carotid bruit.  Cardiovascular:     Rate and Rhythm: Normal rate.     Pulses: Normal pulses.  Pulmonary:     Effort: Pulmonary effort is normal. No respiratory distress.  Abdominal:     General: Abdomen is flat. There is no distension.     Palpations: Abdomen is soft. There is no mass.     Tenderness: There is no abdominal tenderness. There is no right CVA tenderness, left CVA tenderness, guarding or rebound.     Hernia: No hernia is present.  Lymphadenopathy:  Cervical: No cervical adenopathy.  Skin:    General: Skin is warm and dry.     Capillary Refill: Capillary refill takes less than 2 seconds.     Coloration: Skin is not jaundiced or pale.     Findings: No bruising, erythema, lesion or rash.  Neurological:     General: No focal deficit present.     Mental Status: She is alert. Mental status is at baseline.  Psychiatric:        Mood and Affect: Mood normal.        Behavior: Behavior normal.        Thought Content: Thought content normal.        Judgment: Judgment normal.   Musculoskeletal:  Exam found Decreased ROM, Tissue texture changes, Tenderness to palpation and Asymmetry of patient's  head, neck, thorax, ribs, lumbar, pelvis, sacrum and abdomen Osteopathic Structural Exam:   Head: OAESRR, hypertonic suboccipital muscles, R temporal internally rotated and anterior  Neck: SCM hypertonic on the R, C2 rotated R, C3ESRR   Thorax: T5-8SLRR, trap spasm on the R  Ribs: Rib 7 locked up on the R  Lumbar: QL hypertonic on the R, L3-4SRRL  Pelvis: Posterior R innominate  Sacrum: R on R torsion  Abdomen: Diaphragm hypertonic on the R   Results for orders placed or performed during the hospital encounter of 11/16/18  Surgical pathology  Result Value Ref Range   SURGICAL PATHOLOGY      Surgical Pathology CASE: [id number] PATIENT: Tracie Sheppard Surgical Pathology Report     SPECIMEN SUBMITTED: A. Stomach, random; cbx B. Esophagus, random; cbx C. Colon polyp, transverse; cbx  CLINICAL HISTORY: None provided  PRE-OPERATIVE DIAGNOSIS: Dysphagia R13.10, colon cancer screening Z12.11  POST-OPERATIVE DIAGNOSIS: Gastric erosions, colon polyp     DIAGNOSIS: A.  STOMACH; COLD BIOPSY: - ANTRAL AND OXYNTIC MUCOSA WITH AREAS OF REACTIVE GASTRITIS. - NEGATIVE FOR H. PYLORI, DYSPLASIA, AND MALIGNANCY.  B.  ESOPHAGUS, RANDOM; COLD BIOPSY: - UNREMARKABLE SQUAMOUS MUCOSA. - NEGATIVE FOR INTRAEPITHELIAL EOSINOPHILS, DYSPLASIA, AND MALIGNANCY.  C.  COLON POLYP, TRANSVERSE; COLD BIOPSY: - TUBULAR ADENOMA. - NEGATIVE FOR HIGH-GRADE DYSPLASIA AND MALIGNANCY.  GROSS DESCRIPTION: A. Labeled: C BX random gastric Received: Formalin Tissue fragment(s): Multiple Size: Aggregate, 0.8 x 0.4 x 0.1 cm Description: Tan soft tissue fragments Entirely submi tted in 1 cassette.  B. Labeled: C BX random esophagus Received: Formalin Tissue fragment(s): Multiple Size: Aggregate, 1.0 x 0.4 x 0.1 cm Description: Pale-tan soft tissue fragments Entirely submitted in 1 cassette.  C. Labeled: C BX polyp transverse colon Received: Formalin Tissue fragment(s): 1 Size: 0.6 cm Description: Tan soft tissue fragment Entirely submitted in 1 cassette.   Final Diagnosis performed by Quay Burow, MD.   Electronically signed 11/19/2018 3:12:18PM The electronic signature indicates that the named Attending Pathologist has  evaluated the specimen  Technical component performed at [facility], [address], [place], [place] [address] Lab: [phone] Dir: Rush Farmer, MD, MMM  Professional component performed at [facility], [facility], [place], [place], [place] [address] Lab: [phone] Dir: Dellia Nims. Rubinas, MD       Assessment & Plan:   Problem List Items Addressed This Visit      Other   Low back pain    This seems to be due to somatic dysfunction. Treated today with good results as below. Call with any concerns. Continue to monitor.        Other Visit Diagnoses    Right arm pain    -  Primary   This seems to be due to somatic dysfunction. Treated today with good results as below. Call with any concerns. Continue to monitor.    Sacral region somatic dysfunction       Somatic dysfunction of pelvis region       Thoracic segment dysfunction       Rib cage region somatic dysfunction       Cervical somatic dysfunction       Head region somatic dysfunction       Lumbar region somatic dysfunction       Somatic dysfunction of abdominal region          After verbal consent was obtained, patient was treated today with osteopathic manipulative medicine to the regions of the head, neck, thorax, ribs, lumbar, pelvis, sacrum and abdomen using the techniques of cranial, myofascial release, counterstrain, muscle energy, HVLA and soft tissue. Areas of compensation relating to her primary pain source also treated. Patient tolerated the procedure well with good objective and good subjective improvement in symptoms. She left the room in good condition. She was advised to stay well hydrated and that she may have some soreness following the procedure. If not improving or worsening, he will call and come in. She will return for reevaluation   in 2-3 weeks.  Follow up plan: Return 2-3 weeks.

## 2019-01-02 ENCOUNTER — Encounter: Payer: Self-pay | Admitting: Family Medicine

## 2019-01-02 NOTE — Assessment & Plan Note (Signed)
This seems to be due to somatic dysfunction. Treated today with good results as below. Call with any concerns. Continue to monitor.

## 2019-01-09 ENCOUNTER — Encounter: Payer: Self-pay | Admitting: Family Medicine

## 2019-01-15 ENCOUNTER — Encounter: Payer: Self-pay | Admitting: Family Medicine

## 2019-01-15 ENCOUNTER — Other Ambulatory Visit: Payer: Self-pay

## 2019-01-15 ENCOUNTER — Ambulatory Visit (INDEPENDENT_AMBULATORY_CARE_PROVIDER_SITE_OTHER): Payer: BC Managed Care – PPO | Admitting: Family Medicine

## 2019-01-15 VITALS — BP 126/79 | HR 86 | Temp 98.4°F

## 2019-01-15 DIAGNOSIS — M9905 Segmental and somatic dysfunction of pelvic region: Secondary | ICD-10-CM

## 2019-01-15 DIAGNOSIS — M9908 Segmental and somatic dysfunction of rib cage: Secondary | ICD-10-CM | POA: Diagnosis not present

## 2019-01-15 DIAGNOSIS — M9909 Segmental and somatic dysfunction of abdomen and other regions: Secondary | ICD-10-CM

## 2019-01-15 DIAGNOSIS — M9901 Segmental and somatic dysfunction of cervical region: Secondary | ICD-10-CM

## 2019-01-15 DIAGNOSIS — M9903 Segmental and somatic dysfunction of lumbar region: Secondary | ICD-10-CM

## 2019-01-15 DIAGNOSIS — M9902 Segmental and somatic dysfunction of thoracic region: Secondary | ICD-10-CM

## 2019-01-15 DIAGNOSIS — M9904 Segmental and somatic dysfunction of sacral region: Secondary | ICD-10-CM | POA: Diagnosis not present

## 2019-01-15 DIAGNOSIS — M9907 Segmental and somatic dysfunction of upper extremity: Secondary | ICD-10-CM

## 2019-01-15 DIAGNOSIS — G44209 Tension-type headache, unspecified, not intractable: Secondary | ICD-10-CM

## 2019-01-15 DIAGNOSIS — M9900 Segmental and somatic dysfunction of head region: Secondary | ICD-10-CM | POA: Diagnosis not present

## 2019-01-15 DIAGNOSIS — M79601 Pain in right arm: Secondary | ICD-10-CM

## 2019-01-15 NOTE — Progress Notes (Signed)
BP 126/79   Pulse 86   Temp 98.4 F (36.9 C)   LMP  (LMP Unknown)   SpO2 98%    Subjective:    Patient ID: Tracie Sheppard, female    DOB: 11-13-1955, 63 y.o.   MRN: [id number]  HPI: Tracie Sheppard is a 63 y.o. female  Chief Complaint  Patient presents with  . Joint Pain   Still getting climbed on by her grandson. Did well following her last appointment and her low back is feeling a bit better. Her R arm is hurting a bit today and she has a bad headache. She notes that her arm is aching and sore. Has been better with OMT and prednisone from rheumatologist. It's worse with extra work and carrying her grandson. Has been going on for a couple of weeks. Pain will radiate into her shoulder. Headache started this morning. It's located at the back of her head and goes into her neck. Better with OMT and stretching. Worse with stress. Pain is tight and aching. Pain radiates into her R shoulder as well. She is otherwise feeling well with no other concerns or complaints at this time.   Relevant past medical, surgical, family and social history reviewed and updated as indicated. Interim medical history since our last visit reviewed. Allergies and medications reviewed and updated.  Review of Systems  Constitutional: Negative.   Respiratory: Negative.   Cardiovascular: Negative.   Musculoskeletal: Positive for arthralgias, back pain, myalgias, neck pain and neck stiffness. Negative for gait problem and joint swelling.  Neurological: Positive for headaches. Negative for dizziness, tremors, seizures, syncope, facial asymmetry, speech difficulty, weakness, light-headedness and numbness.  Hematological: Negative.   Psychiatric/Behavioral: Negative.     Per HPI unless specifically indicated above     Objective:    BP 126/79   Pulse 86   Temp 98.4 F (36.9 C)   LMP  (LMP Unknown)   SpO2 98%   Wt Readings from Last 3 Encounters:  01/01/19 205 lb (93 kg)  11/16/18 198 lb (89.8 kg)  11/01/18  205 lb 3.2 oz (93.1 kg)    Physical Exam Vitals signs and nursing note reviewed.  Constitutional:      General: She is not in acute distress.    Appearance: Normal appearance. She is not ill-appearing.  HENT:     Head: Normocephalic and atraumatic.     Right Ear: External ear normal.     Left Ear: External ear normal.     Nose: Nose normal.     Mouth/Throat:     Mouth: Mucous membranes are moist.     Pharynx: Oropharynx is clear.  Eyes:     Extraocular Movements: Extraocular movements intact.     Conjunctiva/sclera: Conjunctivae normal.     Pupils: Pupils are equal, round, and reactive to light.  Neck:     Musculoskeletal: No muscular tenderness.     Vascular: No carotid bruit.  Cardiovascular:     Rate and Rhythm: Normal rate.     Pulses: Normal pulses.  Pulmonary:     Effort: Pulmonary effort is normal. No respiratory distress.  Abdominal:     General: Abdomen is flat. There is no distension.     Palpations: Abdomen is soft. There is no mass.     Tenderness: There is no abdominal tenderness. There is no right CVA tenderness, left CVA tenderness, guarding or rebound.     Hernia: No hernia is present.  Lymphadenopathy:     Cervical: No  cervical adenopathy.  Skin:    General: Skin is warm and dry.     Capillary Refill: Capillary refill takes less than 2 seconds.     Coloration: Skin is not jaundiced or pale.     Findings: No bruising, erythema, lesion or rash.  Neurological:     General: No focal deficit present.     Mental Status: She is alert. Mental status is at baseline.  Psychiatric:        Mood and Affect: Mood normal.        Behavior: Behavior normal.        Thought Content: Thought content normal.        Judgment: Judgment normal.   Musculoskeletal:  Exam found Decreased ROM, Tissue texture changes, Tenderness to palpation and Asymmetry of patient's  head, neck, thorax, ribs, lumbar, pelvis, sacrum, upper extremity and abdomen Osteopathic Structural Exam:    Head: OAESSR, OM suture restricted on the R, hypertonic suboccipital muscles  Neck: C3ESRR, SCM hypertonic on the R, trap hypertonic on the R  Thorax: R trap hypertonic  Ribs: Rib 7 locked up on the R  Lumbar: QL hypertonic on the R, Psoas hypertonic on the R  Pelvis: Posterior R innominate  Sacrum:R on R torsion  Abdomen: diaphragm spasm bilaterally R>L  Upper Extremity- clavicle restricted on the R, humerus restrictricted, pec major hypertonic on the R with fascial strain into the R trap  Results for orders placed or performed during the hospital encounter of 11/16/18  Surgical pathology  Result Value Ref Range   SURGICAL PATHOLOGY      Surgical Pathology CASE: [id number] PATIENT: Meeyah Linker Surgical Pathology Report     SPECIMEN SUBMITTED: A. Stomach, random; cbx B. Esophagus, random; cbx C. Colon polyp, transverse; cbx  CLINICAL HISTORY: None provided  PRE-OPERATIVE DIAGNOSIS: Dysphagia R13.10, colon cancer screening Z12.11  POST-OPERATIVE DIAGNOSIS: Gastric erosions, colon polyp     DIAGNOSIS: A.  STOMACH; COLD BIOPSY: - ANTRAL AND OXYNTIC MUCOSA WITH AREAS OF REACTIVE GASTRITIS. - NEGATIVE FOR H. PYLORI, DYSPLASIA, AND MALIGNANCY.  B.  ESOPHAGUS, RANDOM; COLD BIOPSY: - UNREMARKABLE SQUAMOUS MUCOSA. - NEGATIVE FOR INTRAEPITHELIAL EOSINOPHILS, DYSPLASIA, AND MALIGNANCY.  C.  COLON POLYP, TRANSVERSE; COLD BIOPSY: - TUBULAR ADENOMA. - NEGATIVE FOR HIGH-GRADE DYSPLASIA AND MALIGNANCY.  GROSS DESCRIPTION: A. Labeled: C BX random gastric Received: Formalin Tissue fragment(s): Multiple Size: Aggregate, 0.8 x 0.4 x 0.1 cm Description: Tan soft tissue fragments Entirely submi tted in 1 cassette.  B. Labeled: C BX random esophagus Received: Formalin Tissue fragment(s): Multiple Size: Aggregate, 1.0 x 0.4 x 0.1 cm Description: Pale-tan soft tissue fragments Entirely submitted in 1 cassette.  C. Labeled: C BX polyp transverse colon Received: Formalin  Tissue fragment(s): 1 Size: 0.6 cm Description: Tan soft tissue fragment Entirely submitted in 1 cassette.   Final Diagnosis performed by Quay Burow, MD.   Electronically signed 11/19/2018 3:12:18PM The electronic signature indicates that the named Attending Pathologist has evaluated the specimen  Technical component performed at [facility], [address] [place], [place] [address] Lab: [phone] Dir: Rush Farmer, MD, MMM  Professional component performed at [facility], [facility], [place], [place], [place] [address] Lab: [phone] Dir: Dellia Nims. Reuel Derby, MD       Assessment & Plan:   Problem List Items Addressed This Visit    None    Visit Diagnoses    Right arm pain    -  Primary   Seems to be due to pec hypertonicity. She does have  somatic dysfunction contributing to her symptoms. Treated today with good results as below. Call w/ concerns   Acute non intractable tension-type headache       Due to her TMJ and neck muscle spasm. She does have somatic dysfunction that is contributing to her symptoms. Treated today as below.    Sacral region somatic dysfunction       Somatic dysfunction of pelvis region       Thoracic segment dysfunction       Rib cage region somatic dysfunction       Cervical somatic dysfunction       Head region somatic dysfunction       Lumbar region somatic dysfunction       Somatic dysfunction of abdominal region       Somatic dysfunction of upper extremities         After verbal consent was obtained, patient was treated today with osteopathic manipulative medicine to the regions of the head, neck, thorax, ribs, lumbar, pelvis, sacrum, abdomen and upper extremity using the techniques of cranial, myofascial release, counterstrain, muscle energy, HVLA and soft tissue. Areas of compensation relating to her primary pain source also treated. Patient tolerated the procedure well with good objective and good subjective improvement in  symptoms. She left the room in good condition. She was advised to stay well hydrated and that she may have some soreness following the procedure. If not improving or worsening, she will call and come in. She will return for reevaluation  2-4 weeks as needed.   Follow up plan: Return in about 4 weeks (around 02/12/2019).

## 2019-01-28 ENCOUNTER — Encounter: Payer: Self-pay | Admitting: Family Medicine

## 2019-01-28 DIAGNOSIS — Z20822 Contact with and (suspected) exposure to covid-19: Secondary | ICD-10-CM

## 2019-01-28 DIAGNOSIS — Z20828 Contact with and (suspected) exposure to other viral communicable diseases: Secondary | ICD-10-CM

## 2019-01-28 NOTE — Telephone Encounter (Signed)
Please tell her to stay as far away from [place] as possible. I'll put in orders to have [facility] (and her husband and [place] Post can just drive up) If she's positive or has any symptoms, I want her to stay away from her Aunt and I'll see if CCM can help her out with the pill box. Stay home for 14 days and let me know if she has any questions.

## 2019-01-28 NOTE — Telephone Encounter (Signed)
Patient notified

## 2019-01-30 ENCOUNTER — Other Ambulatory Visit: Payer: Self-pay | Admitting: *Deleted

## 2019-01-30 ENCOUNTER — Other Ambulatory Visit: Payer: Self-pay | Admitting: Family Medicine

## 2019-01-30 DIAGNOSIS — Z20822 Contact with and (suspected) exposure to covid-19: Secondary | ICD-10-CM

## 2019-01-31 ENCOUNTER — Other Ambulatory Visit: Payer: Self-pay | Admitting: Family Medicine

## 2019-01-31 LAB — NOVEL CORONAVIRUS, NAA: SARS-CoV-2, NAA: NOT DETECTED

## 2019-01-31 MED ORDER — THYROID 60 MG PO TABS: ORAL_TABLET | ORAL | 0 refills | Status: DC

## 2019-01-31 NOTE — Telephone Encounter (Signed)
Routing to provider  

## 2019-02-08 ENCOUNTER — Other Ambulatory Visit: Payer: Self-pay | Admitting: Family Medicine

## 2019-02-08 NOTE — Telephone Encounter (Signed)
Forwarding medication refill request to PCP for review. 

## 2019-02-12 ENCOUNTER — Encounter: Payer: Self-pay | Admitting: Family Medicine

## 2019-02-12 ENCOUNTER — Ambulatory Visit: Payer: BC Managed Care – PPO | Admitting: Family Medicine

## 2019-02-26 ENCOUNTER — Telehealth: Payer: Self-pay | Admitting: Gastroenterology

## 2019-02-26 NOTE — Telephone Encounter (Signed)
Pt called wanting to know if her results from her manometry test have came back. She had the test done on February 08, 2019. She asked for a call back at [phone]  Thanks,  [place] SMA

## 2019-02-26 NOTE — Telephone Encounter (Signed)
Please inform patient that her esophageal manometry results came back normal  RV

## 2019-02-26 NOTE — Telephone Encounter (Signed)
Please scheduled this appointment

## 2019-02-26 NOTE — Telephone Encounter (Signed)
Patient verbalized understanding. Patient states she is still having difficult swallowing. Patient wants to know what you recommend next.

## 2019-02-26 NOTE — Telephone Encounter (Signed)
Please arrange a virtual visit or a televisit to see me to discuss further  RV

## 2019-02-26 NOTE — Telephone Encounter (Signed)
Left vm to offer televisit with Dr. Marius Ditch per her note

## 2019-03-04 ENCOUNTER — Other Ambulatory Visit: Payer: Self-pay

## 2019-03-05 ENCOUNTER — Other Ambulatory Visit: Payer: Self-pay

## 2019-03-05 ENCOUNTER — Encounter: Payer: Self-pay | Admitting: Gastroenterology

## 2019-03-05 ENCOUNTER — Ambulatory Visit (INDEPENDENT_AMBULATORY_CARE_PROVIDER_SITE_OTHER): Payer: BC Managed Care – PPO | Admitting: Gastroenterology

## 2019-03-05 ENCOUNTER — Ambulatory Visit (INDEPENDENT_AMBULATORY_CARE_PROVIDER_SITE_OTHER): Payer: BC Managed Care – PPO | Admitting: Family Medicine

## 2019-03-05 ENCOUNTER — Encounter: Payer: Self-pay | Admitting: Family Medicine

## 2019-03-05 VITALS — BP 129/82 | HR 90 | Temp 99.0°F | Ht 64.57 in | Wt 206.1 lb

## 2019-03-05 DIAGNOSIS — R7301 Impaired fasting glucose: Secondary | ICD-10-CM

## 2019-03-05 DIAGNOSIS — Z1231 Encounter for screening mammogram for malignant neoplasm of breast: Secondary | ICD-10-CM

## 2019-03-05 DIAGNOSIS — F331 Major depressive disorder, recurrent, moderate: Secondary | ICD-10-CM

## 2019-03-05 DIAGNOSIS — R319 Hematuria, unspecified: Secondary | ICD-10-CM

## 2019-03-05 DIAGNOSIS — E782 Mixed hyperlipidemia: Secondary | ICD-10-CM

## 2019-03-05 DIAGNOSIS — F419 Anxiety disorder, unspecified: Secondary | ICD-10-CM | POA: Diagnosis not present

## 2019-03-05 DIAGNOSIS — E079 Disorder of thyroid, unspecified: Secondary | ICD-10-CM | POA: Diagnosis not present

## 2019-03-05 DIAGNOSIS — Z0000 Encounter for general adult medical examination without abnormal findings: Secondary | ICD-10-CM | POA: Diagnosis not present

## 2019-03-05 DIAGNOSIS — R0989 Other specified symptoms and signs involving the circulatory and respiratory systems: Secondary | ICD-10-CM

## 2019-03-05 DIAGNOSIS — G4733 Obstructive sleep apnea (adult) (pediatric): Secondary | ICD-10-CM

## 2019-03-05 DIAGNOSIS — R1310 Dysphagia, unspecified: Secondary | ICD-10-CM

## 2019-03-05 LAB — UA/M W/RFLX CULTURE, ROUTINE
Bilirubin, UA: NEGATIVE
Glucose, UA: NEGATIVE
Leukocytes,UA: NEGATIVE
Nitrite, UA: NEGATIVE
Protein,UA: NEGATIVE
RBC, UA: NEGATIVE
Specific Gravity, UA: 1.025 (ref 1.005–1.030)
Urobilinogen, Ur: 1 mg/dL (ref 0.2–1.0)
pH, UA: 7 (ref 5.0–7.5)

## 2019-03-05 LAB — MICROALBUMIN, URINE WAIVED
Creatinine, Urine Waived: 200 mg/dL (ref 10–300)
Microalb, Ur Waived: 30 mg/L — ABNORMAL HIGH (ref 0–19)
Microalb/Creat Ratio: <30 mg/g (ref <30)

## 2019-03-05 LAB — BAYER DCA HB A1C WAIVED: HB A1C (BAYER DCA - WAIVED): 5.6 % (ref <7.0)

## 2019-03-05 MED ORDER — CETIRIZINE HCL 10 MG PO TABS: 10.0000 mg | ORAL_TABLET | Freq: Every day | ORAL | 3 refills | Status: DC | PRN

## 2019-03-05 MED ORDER — BUPROPION HCL ER (XL) 300 MG PO TB24: 300.0000 mg | ORAL_TABLET | Freq: Every day | ORAL | 1 refills | Status: DC

## 2019-03-05 MED ORDER — SPIRONOLACTONE 100 MG PO TABS: 100.0000 mg | ORAL_TABLET | Freq: Two times a day (BID) | ORAL | 1 refills | Status: DC

## 2019-03-05 MED ORDER — OMEPRAZOLE 40 MG PO CPDR: 40.0000 mg | DELAYED_RELEASE_CAPSULE | Freq: Every day | ORAL | 2 refills | Status: DC

## 2019-03-05 MED ORDER — CLONAZEPAM 0.5 MG PO TABS: ORAL_TABLET | ORAL | 0 refills | Status: DC

## 2019-03-05 NOTE — Assessment & Plan Note (Signed)
Concerned about her thyroid- thinking about stopping her meds. Will wait for a bit while she gets used to the CPAP and recheck in about 3 months.

## 2019-03-05 NOTE — Assessment & Plan Note (Signed)
Under good control on current regimen. Continue current regimen. Continue to monitor. Call with any concerns. Refills given. Labs drawn today.   

## 2019-03-05 NOTE — Progress Notes (Signed)
Tracie Sear, MD [address]  [place]  [place], [place] [address]  Main: [phone]  Fax: [phone]    Gastroenterology Consultation Tele Visit  Referring Provider:     Valerie Roys, DO Primary Care Physician:  Valerie Roys, DO Primary Gastroenterologist:  Dr. Cephas Darby Reason for Consultation: Follow-up of difficulty swallowing/ throat discomfort        HPI:   Tracie Sheppard is a 63 y.o. female referred by Dr. 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   I discussed the limitations, risks, security and privacy concerns of performing an evaluation and management service by telephone and the availability of in person appointments. I also discussed with the patient that there may be a patient responsible charge related to this service. The patient expressed understanding and agreed to proceed.  Location of the Patient: Home  Location of the provider: Office  Persons participating in the visit: Patient and provider only  History of Present Illness: Initially seen Tracie Sheppard on 11/01/2018 for difficulty swallowing, throat discomfort particularly with solids which is intermittent.  She has history of somatic dysfunction for which she is undergoing treatment.  She also has history of anxiety, currently being treated.  She underwent EGD including esophageal biopsies which were unremarkable.  Subsequently, underwent esophageal manometry at [facility] which was normal.  She does not tried proton pump inhibitors as it is suggested in the past She continues to have throat discomfort which is not worse at this time.  She wants to know if there is any other work-up that she needs to undergo    NSAIDs: None  Antiplts/Anticoagulants/Anti  thrombotics: None  GI Procedures: Colonoscopy at age 57, normal She denies family history of GI malignancy EGD and colonoscopy 11/16/2018  - Normal duodenal bulb and second portion of the duodenum. - Recently bleeding erosive gastropathy. Biopsied. - Esophagogastric landmarks identified. - Normal gastroesophageal junction and esophagus. Biopsied.  - One 3 mm polyp in the transverse colon, removed with a cold biopsy forceps. Resected and retrieved. - The distal rectum and anal verge are normal on retroflexion view.  DIAGNOSIS:  A. STOMACH; COLD BIOPSY:  - ANTRAL AND OXYNTIC MUCOSA WITH AREAS OF REACTIVE GASTRITIS.  - NEGATIVE FOR H. PYLORI, DYSPLASIA, AND MALIGNANCY.   B. ESOPHAGUS, RANDOM; COLD BIOPSY:  - UNREMARKABLE SQUAMOUS MUCOSA.  - NEGATIVE FOR INTRAEPITHELIAL EOSINOPHILS, DYSPLASIA, AND MALIGNANCY.   C. COLON POLYP, TRANSVERSE; COLD BIOPSY:  - TUBULAR ADENOMA.  - NEGATIVE FOR HIGH-GRADE DYSPLASIA AND MALIGNANCY.   Past Medical History:  Diagnosis Date  . Anxiety   . Bilateral carpal tunnel syndrome 05/30/2017  . Chronic back pain   . Diabetes mellitus without complication ([place])    Prediabetic  . High serum testosterone   . Kidney stone   . Sleep apnea   . Thyroid disease    did not show up in lab work but per other symptoms previous doctor started her on this    Past Surgical History:  Procedure Laterality Date  . CHOLECYSTECTOMY     1997  . COLONOSCOPY WITH PROPOFOL N/A 11/16/2018   Procedure: COLONOSCOPY WITH PROPOFOL;  Surgeon: Lin Landsman, MD;  Location: [facility] ENDOSCOPY;  Service: Gastroenterology;  Laterality: N/A;  . ESOPHAGOGASTRODUODENOSCOPY (EGD)  WITH PROPOFOL N/A 11/16/2018   Procedure: ESOPHAGOGASTRODUODENOSCOPY (EGD) WITH PROPOFOL;  Surgeon: Lin Landsman, MD;  Location: [facility] ENDOSCOPY;  Service: Gastroenterology;  Laterality: N/A;  . NASAL SEPTUM SURGERY    . OOPHORECTOMY    . TUBAL LIGATION     1989    Current Outpatient  Medications:  .  buPROPion (WELLBUTRIN XL) 300 MG 24 hr tablet, Take 1 tablet (300 mg total) by mouth daily., Disp: 90 tablet, Rfl: 1 .  cetirizine (ZYRTEC) 10 MG tablet, Take 1 tablet (10 mg total) by mouth daily as needed for allergies., Disp: 90 tablet, Rfl: 3 .  Cholecalciferol (VITAMIN D3) 125 MCG (5000 UT) TABS, Take by mouth., Disp: , Rfl:  .  citalopram (CELEXA) 40 MG tablet, TAKE 1.5 TABLETS (60 MG TOTAL) BY MOUTH DAILY., Disp: 135 tablet, Rfl: 1 .  clonazePAM (KLONOPIN) 0.5 MG tablet, TAKE 1 TABLET BY MOUTH DAILY AS NEEDED FOR ANIXETY, Disp: 30 tablet, Rfl: 0 .  fluticasone (FLONASE) 50 MCG/ACT nasal spray, 1 spray by Each Nare route daily., Disp: 16 g, Rfl: 12 .  ibuprofen (GOODSENSE IBUPROFEN) 200 MG tablet, Take 200 mg by mouth as needed. , Disp: , Rfl:  .  Multiple Vitamin (MULTI-VITAMIN) tablet, Take by mouth., Disp: , Rfl:  .  spironolactone (ALDACTONE) 100 MG tablet, Take 1 tablet (100 mg total) by mouth 2 (two) times daily., Disp: 180 tablet, Rfl: 1 .  thyroid (ARMOUR THYROID) 60 MG tablet, TAKE 1 TABLET BY MOUTH EVERY DAY BEFORE BREAKFAST, Disp: 90 tablet, Rfl: 0 .  triamcinolone ointment (KENALOG) 0.5 %, APPLY TO AFFECTED AREA TWICE A DAY, Disp: 30 g, Rfl: 0 .  omeprazole (PRILOSEC) 40 MG capsule, Take 1 capsule (40 mg total) by mouth daily before breakfast., Disp: 30 capsule, Rfl: 2   Family History  Problem Relation Age of Onset  . Diabetes Mother   . Anemia Mother   . Hyperlipidemia Mother   . Hypertension Father   . Cancer Father   . Celiac disease Sister   . Diabetes Maternal Grandmother   . Stroke Maternal Grandmother   . Heart attack Maternal Grandmother   . Heart disease Paternal Grandfather      Social History   Tobacco Use  . Smoking status: Never Smoker  . Smokeless tobacco: Never Used  Substance Use Topics  . Alcohol use: Yes    Comment: On occasion  . Drug use: No    Allergies as of 03/05/2019 - Review Complete 03/05/2019  Allergen Reaction  Noted  . Aleve [naproxen sodium] Other (See Comments) 11/08/2016  . Statins Other (See Comments) 07/25/2017     Imaging Studies: Reviewed  Assessment and Plan:   Tracie Sheppard is a 63 y.o. female with metabolic syndrome, anxiety, somatic dysfunction has chronic throat discomfort.  EGD and esophageal manometry is unremarkable.  Her symptoms are mostly functional.  Recommend to try omeprazole 40 mg before breakfast for 4 to 6 weeks.  If persistent, next step would be to undergo CT neck to evaluate soft tissue of the neck   Follow Up Instructions:   I discussed the assessment and treatment plan with the patient. The patient was provided an opportunity to ask questions and all were answered. The patient agreed with the plan and demonstrated an understanding of the instructions.   The patient was advised to call back or seek an in-person evaluation if the symptoms worsen or if the condition fails to improve as anticipated.  I provided 15 minutes of non-face-to-face  time during this encounter.   Follow up in 6 weeks   Cephas Darby, MD

## 2019-03-05 NOTE — Assessment & Plan Note (Signed)
Rechecking labs today. Await results. Call with any concerns. Continue to monitor.

## 2019-03-05 NOTE — Progress Notes (Signed)
BP 129/82   Pulse 90   Temp 99 F (37.2 C)   Ht 5' 4.57" (1.64 m)   Wt 206 lb 2 oz (93.5 kg)   LMP  (LMP Unknown)   SpO2 98%   BMI 34.76 kg/m    Subjective:    Patient ID: Tracie Sheppard, female    DOB: Jul 10, 1955, 63 y.o.   MRN: [id number]  HPI: Tracie Sheppard is a 63 y.o. female presenting on 03/05/2019 for comprehensive medical examination. Current medical complaints include:  Impaired Fasting Glucose HbA1C:  Lab Results  Component Value Date   HGBA1C 5.6 02/27/2018   Duration of elevated blood sugar: chronic Polydipsia: no Polyuria: no Weight change: no Visual disturbance: no Glucose Monitoring: no    Accucheck frequency: Not Checking Diabetic Education: Not Completed Family history of diabetes: yes  HYPERLIPIDEMIA Hyperlipidemia status: stable Satisfied with current treatment?  yes Past cholesterol meds: statins Supplements: none Aspirin:  no The 10-year ASCVD risk score Mikey Bussing DC Jr., et al., 2013) is: 19.3%   Values used to calculate the score:     Age: 9 years     Sex: Female     Is Non-Hispanic African American: Yes     Diabetic: Yes     Tobacco smoker: No     Systolic Blood Pressure: 382 mmHg     Is BP treated: Yes     HDL Cholesterol: 49 mg/dL     Total Cholesterol: 220 mg/dL Chest pain:  no Coronary artery disease:  no  HYPOTHYROIDISM Thyroid control status:stable Satisfied with current treatment? yes Medication side effects: no Medication compliance: excellent compliance Recent dose adjustment:no Fatigue: no Cold intolerance: no Heat intolerance: no Weight gain: no Weight loss: no Constipation: no Diarrhea/loose stools: no Palpitations: no Lower extremity edema: no Anxiety/depressed mood: yes  ANXIETY/STRESS Duration:stable Anxious mood: yes  Excessive worrying: yes Irritability: no  Sweating: no Nausea: no Palpitations:no Hyperventilation: no Panic attacks: no Agoraphobia: no  Obscessions/compulsions: no Depressed mood:  no Depression screen [facility] 2/9 03/05/2019 11/29/2018 04/05/2018 02/27/2018 08/24/2017  Decreased Interest 0 1 2 0 1  Down, Depressed, Hopeless 0 1 1 1 1   PHQ - 2 Score 0 2 3 1 2   Altered sleeping 3 3 3 3 3   Tired, decreased energy 1 3 2 1 2   Change in appetite 2 3 3 1 2   Feeling bad or failure about yourself  1 1 1  0 1  Trouble concentrating 0 1 0 0 0  Moving slowly or fidgety/restless 0 0 0 0 0  Suicidal thoughts 0 0 0 0 0  PHQ-9 Score 7 13 12 6 10   Difficult doing work/chores Not difficult at all Somewhat difficult Very difficult Somewhat difficult Somewhat difficult  Some recent data might be hidden   Anhedonia: no Weight changes: no Insomnia: yes hard to fall asleep  Hypersomnia: no Fatigue/loss of energy: yes Feelings of worthlessness: no Feelings of guilt: yes Impaired concentration/indecisiveness: no Suicidal ideations: no  Crying spells: no Recent Stressors/Life Changes: yes   Relationship problems: no   Family stress: yes     Financial stress: no    Job stress: no    Recent death/loss: no  She currently lives with: husband, daughter and grandson Menopausal Symptoms: no  Depression Screen done today and results listed below:  Depression screen [facility] 2/9 03/05/2019 11/29/2018 04/05/2018 02/27/2018 08/24/2017  Decreased Interest 0 1 2 0 1  Down, Depressed, Hopeless 0 1 1 1 1   PHQ - 2 Score 0  2 3 1 2   Altered sleeping 3 3 3 3 3   Tired, decreased energy 1 3 2 1 2   Change in appetite 2 3 3 1 2   Feeling bad or failure about yourself  1 1 1  0 1  Trouble concentrating 0 1 0 0 0  Moving slowly or fidgety/restless 0 0 0 0 0  Suicidal thoughts 0 0 0 0 0  PHQ-9 Score 7 13 12 6 10   Difficult doing work/chores Not difficult at all Somewhat difficult Very difficult Somewhat difficult Somewhat difficult  Some recent data might be hidden   Past Medical History:  Past Medical History:  Diagnosis Date  . Allergy    Seasonal  . Anxiety   . Bilateral carpal tunnel syndrome 05/30/2017   . Chronic back pain   . Depression, major, recurrent, moderate ([place])   . Diabetes mellitus without complication ([place])    Prediabetic  . High serum testosterone   . Kidney stone   . Sleep apnea   . Thyroid disease    did not show up in lab work but per other symptoms previous doctor started her on this    Surgical History:  Past Surgical History:  Procedure Laterality Date  . CHOLECYSTECTOMY     1997  . COLONOSCOPY WITH PROPOFOL N/A 11/16/2018   Procedure: COLONOSCOPY WITH PROPOFOL;  Surgeon: Lin Landsman, MD;  Location: [facility] ENDOSCOPY;  Service: Gastroenterology;  Laterality: N/A;  . ESOPHAGOGASTRODUODENOSCOPY (EGD) WITH PROPOFOL N/A 11/16/2018   Procedure: ESOPHAGOGASTRODUODENOSCOPY (EGD) WITH PROPOFOL;  Surgeon: Lin Landsman, MD;  Location: [facility] ENDOSCOPY;  Service: Gastroenterology;  Laterality: N/A;  . NASAL SEPTUM SURGERY    . OOPHORECTOMY    . TUBAL LIGATION     1989    Medications:  Current Outpatient Medications on File Prior to Visit  Medication Sig  . Cholecalciferol (VITAMIN D3) 125 MCG (5000 UT) TABS Take by mouth.  . citalopram (CELEXA) 40 MG tablet TAKE 1.5 TABLETS (60 MG TOTAL) BY MOUTH DAILY.  . fluticasone (FLONASE) 50 MCG/ACT nasal spray 1 spray by Each Nare route daily.  Marland Kitchen ibuprofen (GOODSENSE IBUPROFEN) 200 MG tablet Take 200 mg by mouth as needed.   . Multiple Vitamin (MULTI-VITAMIN) tablet Take by mouth.  . thyroid (ARMOUR THYROID) 60 MG tablet TAKE 1 TABLET BY MOUTH EVERY DAY BEFORE BREAKFAST  . triamcinolone ointment (KENALOG) 0.5 % APPLY TO AFFECTED AREA TWICE A DAY   No current facility-administered medications on file prior to visit.     Allergies:  Allergies  Allergen Reactions  . Aleve [Naproxen Sodium] Other (See Comments)    Chest pain  . Statins Other (See Comments)    myalgias    Social History:  Social History   Socioeconomic History  . Marital status: Married    Spouse name: Not on file  . Number of children: 1  .  Years of education: 84  . Highest education level: Not on file  Occupational History  . Occupation: unemployed  Social Needs  . Financial resource strain: Not on file  . Food insecurity    Worry: Not on file    Inability: Not on file  . Transportation needs    Medical: Not on file    Non-medical: Not on file  Tobacco Use  . Smoking status: Never Smoker  . Smokeless tobacco: Never Used  Substance and Sexual Activity  . Alcohol use: Yes    Comment: On occasion  . Drug use: No  . Sexual activity: Yes  Birth control/protection: Post-menopausal, None  Lifestyle  . Physical activity    Days per week: Not on file    Minutes per session: Not on file  . Stress: Not on file  Relationships  . Social Herbalist on phone: Not on file    Gets together: Not on file    Attends religious service: Not on file    Active member of club or organization: Not on file    Attends meetings of clubs or organizations: Not on file    Relationship status: Not on file  . Intimate partner violence    Fear of current or ex partner: Not on file    Emotionally abused: Not on file    Physically abused: Not on file    Forced sexual activity: Not on file  Other Topics Concern  . Not on file  Social History Narrative   Lives with spouse   Caffeine use: 1-2 drinks per day   Right handed    Social History   Tobacco Use  Smoking Status Never Smoker  Smokeless Tobacco Never Used   Social History   Substance and Sexual Activity  Alcohol Use Yes   Comment: On occasion    Family History:  Family History  Problem Relation Age of Onset  . Diabetes Mother   . Anemia Mother   . Hyperlipidemia Mother   . Arthritis Mother   . Hypertension Father   . Cancer Father   . Celiac disease Sister   . Diabetes Maternal Grandmother   . Stroke Maternal Grandmother   . Heart attack Maternal Grandmother   . Heart disease Paternal Grandfather     Past medical history, surgical history,  medications, allergies, family history and social history reviewed with patient today and changes made to appropriate areas of the chart.   Review of Systems  Constitutional: Negative.   HENT: Positive for congestion, ear pain and sore throat. Negative for ear discharge, hearing loss, nosebleeds, sinus pain and tinnitus.   Eyes: Negative.   Respiratory: Positive for shortness of breath (with exertion- not severe). Negative for cough, hemoptysis, sputum production, wheezing and stridor.   Cardiovascular: Negative.   Gastrointestinal: Positive for heartburn. Negative for abdominal pain, blood in stool, constipation, diarrhea, melena, nausea and vomiting.  Genitourinary: Negative.   Musculoskeletal: Positive for back pain and myalgias. Negative for falls, joint pain and neck pain.  Skin: Negative.   Neurological: Negative.   Endo/Heme/Allergies: Negative.   Psychiatric/Behavioral: Positive for depression. Negative for hallucinations, memory loss, substance abuse and suicidal ideas. The patient is nervous/anxious. The patient does not have insomnia.     All other ROS negative except what is listed above and in the HPI.      Objective:    BP 129/82   Pulse 90   Temp 99 F (37.2 C)   Ht 5' 4.57" (1.64 m)   Wt 206 lb 2 oz (93.5 kg)   LMP  (LMP Unknown)   SpO2 98%   BMI 34.76 kg/m   Wt Readings from Last 3 Encounters:  03/05/19 206 lb 2 oz (93.5 kg)  01/01/19 205 lb (93 kg)  11/16/18 198 lb (89.8 kg)    Physical Exam Vitals signs and nursing note reviewed.  Constitutional:      General: She is not in acute distress.    Appearance: Normal appearance. She is not ill-appearing, toxic-appearing or diaphoretic.  HENT:     Head: Normocephalic and atraumatic.     Right Ear:  Tympanic membrane, ear canal and external ear normal. There is no impacted cerumen.     Left Ear: Tympanic membrane, ear canal and external ear normal. There is no impacted cerumen.     Nose: Nose normal. No  congestion or rhinorrhea.     Mouth/Throat:     Mouth: Mucous membranes are moist.     Pharynx: Oropharynx is clear. No oropharyngeal exudate or posterior oropharyngeal erythema.  Eyes:     General: No scleral icterus.       Right eye: No discharge.        Left eye: No discharge.     Extraocular Movements: Extraocular movements intact.     Conjunctiva/sclera: Conjunctivae normal.     Pupils: Pupils are equal, round, and reactive to light.  Neck:     Musculoskeletal: Normal range of motion and neck supple. No neck rigidity or muscular tenderness.     Vascular: No carotid bruit.  Cardiovascular:     Rate and Rhythm: Normal rate and regular rhythm.     Pulses: Normal pulses.     Heart sounds: No murmur. No friction rub. No gallop.   Pulmonary:     Effort: Pulmonary effort is normal. No respiratory distress.     Breath sounds: Normal breath sounds. No stridor. No wheezing, rhonchi or rales.  Chest:     Chest wall: No tenderness.  Abdominal:     General: Abdomen is flat. Bowel sounds are normal. There is no distension.     Palpations: Abdomen is soft. There is no mass.     Tenderness: There is no abdominal tenderness. There is no right CVA tenderness, left CVA tenderness, guarding or rebound.     Hernia: No hernia is present.  Genitourinary:    Comments: Breast and pelvic exams deferred with shared decision making Musculoskeletal:        General: No swelling, tenderness, deformity or signs of injury.     Right lower leg: No edema.     Left lower leg: No edema.  Lymphadenopathy:     Cervical: No cervical adenopathy.  Skin:    General: Skin is warm and dry.     Capillary Refill: Capillary refill takes less than 2 seconds.     Coloration: Skin is not jaundiced or pale.     Findings: No bruising, erythema, lesion or rash.  Neurological:     General: No focal deficit present.     Mental Status: She is alert and oriented to person, place, and time. Mental status is at baseline.      Cranial Nerves: No cranial nerve deficit.     Sensory: No sensory deficit.     Motor: No weakness.     Coordination: Coordination normal.     Gait: Gait normal.     Deep Tendon Reflexes: Reflexes normal.  Psychiatric:        Mood and Affect: Mood normal.        Behavior: Behavior normal.        Thought Content: Thought content normal.        Judgment: Judgment normal.     Results for orders placed or performed in visit on 01/30/19  Novel Coronavirus, NAA ([facility])   Specimen: Oropharyngeal(OP) collection in vial transport medium   OROPHARYNGEA  TESTING  Result Value Ref Range   SARS-CoV-2, NAA Not Detected Not Detected      Assessment & Plan:   Problem List Items Addressed This Visit      Respiratory  OSA (obstructive sleep apnea)    Due to get CPAP this week. Call with any concerns.         Digestive   Dysphagia    Continue to follow with GI. Call with any concerns. Continue to monitor.         Endocrine   Thyroid disease    Concerned about her thyroid- thinking about stopping her meds. Will wait for a bit while she gets used to the CPAP and recheck in about 3 months.       Relevant Orders   CBC with Differential OUT   Comp Met (CMET)   TSH   IFG (impaired fasting glucose)    Rechecking levels today. Await results. Call with any concerns.       Relevant Orders   Bayer DCA Hb A1c Waived   CBC with Differential OUT   Comp Met (CMET)   Microalbumin, Urine Waived     Other   Hematuria    Rechecking UA today. Await results.       Relevant Orders   CBC with Differential OUT   Comp Met (CMET)   UA/M w/rflx Culture, Routine   Acute anxiety    Under good control on current regimen. Continue current regimen. Continue to monitor. Call with any concerns. Refills given. Labs drawn today.       Relevant Medications   buPROPion (WELLBUTRIN XL) 300 MG 24 hr tablet   Other Relevant Orders   CBC with Differential OUT   Comp Met (CMET)   Hyperlipidemia     Rechecking labs today. Await results. Call with any concerns. Continue to monitor.       Relevant Medications   spironolactone (ALDACTONE) 100 MG tablet   Other Relevant Orders   CBC with Differential OUT   Comp Met (CMET)   Lipid Panel w/o Chol/HDL Ratio OUT   Depression, major, recurrent, moderate ([facility])    Under good control on current regimen. Continue current regimen. Continue to monitor. Call with any concerns. Refills given. Labs drawn today.      Relevant Medications   buPROPion (WELLBUTRIN XL) 300 MG 24 hr tablet   Other Relevant Orders   CBC with Differential OUT   Comp Met (CMET)   Vit D  25 hydroxy (rtn osteoporosis monitoring)    Other Visit Diagnoses    Routine general medical examination at a health care facility    -  Primary   Vaccines up to date. Screening labs checked today. Mammogram ordered. Pap and colonoscopy up to date. Continue diet and exercise. Continue to monitor.    Encounter for screening mammogram for malignant neoplasm of breast       Mammogram ordered today   Relevant Orders   MM 3D SCREEN BREAST BILATERAL       Follow up plan: Return in about 3 months (around 06/03/2019) for follow up thyroid.   LABORATORY TESTING:  - Pap smear: up to date  IMMUNIZATIONS:   - Tdap: Tetanus vaccination status reviewed: last tetanus booster within 10 years. - Influenza: Up to date - Pneumovax: Up to date  SCREENING: -Mammogram: Ordered today  - Colonoscopy: Up to date   PATIENT COUNSELING:   Advised to take 1 mg of folate supplement per day if capable of pregnancy.   Sexuality: Discussed sexually transmitted diseases, partner selection, use of condoms, avoidance of unintended pregnancy  and contraceptive alternatives.   Advised to avoid cigarette smoking.  I discussed with the patient that most people either abstain  from alcohol or drink within safe limits (<=14/week and <=4 drinks/occasion for males, <=7/weeks and <= 3 drinks/occasion for females) and  that the risk for alcohol disorders and other health effects rises proportionally with the number of drinks per week and how often a drinker exceeds daily limits.  Discussed cessation/primary prevention of drug use and availability of treatment for abuse.   Diet: Encouraged to adjust caloric intake to maintain  or achieve ideal body weight, to reduce intake of dietary saturated fat and total fat, to limit sodium intake by avoiding high sodium foods and not adding table salt, and to maintain adequate dietary potassium and calcium preferably from fresh fruits, vegetables, and low-fat dairy products.    stressed the importance of regular exercise  Injury prevention: Discussed safety belts, safety helmets, smoke detector, smoking near bedding or upholstery.   Dental health: Discussed importance of regular tooth brushing, flossing, and dental visits.    NEXT PREVENTATIVE PHYSICAL DUE IN 1 YEAR. Return in about 3 months (around 06/03/2019) for follow up thyroid.

## 2019-03-05 NOTE — Assessment & Plan Note (Signed)
Continue to follow with GI. Call with any concerns. Continue to monitor.  

## 2019-03-05 NOTE — Assessment & Plan Note (Signed)
Due to get CPAP this week. Call with any concerns.

## 2019-03-05 NOTE — Assessment & Plan Note (Signed)
Rechecking UA today. Await results.  

## 2019-03-05 NOTE — Assessment & Plan Note (Signed)
Rechecking levels today. Await results. Call with any concerns.  

## 2019-03-05 NOTE — Patient Instructions (Signed)
Call to schedule your mammogram: [facility]  Address: [address], [place], [place] [address]  Phone: [phone]   Health Maintenance, Female Adopting a healthy lifestyle and getting preventive care are important in promoting health and wellness. Ask your health care provider about:  The right schedule for you to have regular tests and exams.  Things you can do on your own to prevent diseases and keep yourself healthy. What should I know about diet, weight, and exercise? Eat a healthy diet   Eat a diet that includes plenty of vegetables, fruits, low-fat dairy products, and lean protein.  Do not eat a lot of foods that are high in solid fats, added sugars, or sodium. Maintain a healthy weight Body mass index (BMI) is used to identify weight problems. It estimates body fat based on height and weight. Your health care provider can help determine your BMI and help you achieve or maintain a healthy weight. Get regular exercise Get regular exercise. This is one of the most important things you can do for your health. Most adults should:  Exercise for at least 150 minutes each week. The exercise should increase your heart rate and make you sweat (moderate-intensity exercise).  Do strengthening exercises at least twice a week. This is in addition to the moderate-intensity exercise.  Spend less time sitting. Even light physical activity can be beneficial. Watch cholesterol and blood lipids Have your blood tested for lipids and cholesterol at 61 years of age, then have this test every 5 years. Have your cholesterol levels checked more often if:  Your lipid or cholesterol levels are high.  You are older than 61 years of age.  You are at high risk for heart disease. What should I know about cancer screening? Depending on your health history and family history, you may need to have cancer screening at various ages. This may include screening  for:  Breast cancer.  Cervical cancer.  Colorectal cancer.  Skin cancer.  Lung cancer. What should I know about heart disease, diabetes, and high blood pressure? Blood pressure and heart disease  High blood pressure causes heart disease and increases the risk of stroke. This is more likely to develop in people who have high blood pressure readings, are of African descent, or are overweight.  Have your blood pressure checked: ? Every 3-5 years if you are 18-39 years of age. ? Every year if you are 40 years old or older. Diabetes Have regular diabetes screenings. This checks your fasting blood sugar level. Have the screening done:  Once every three years after age 40 if you are at a normal weight and have a low risk for diabetes.  More often and at a younger age if you are overweight or have a high risk for diabetes. What should I know about preventing infection? Hepatitis B If you have a higher risk for hepatitis B, you should be screened for this virus. Talk with your health care provider to find out if you are at risk for hepatitis B infection. Hepatitis C Testing is recommended for:  Everyone born from 1945 through 1965.  Anyone with known risk factors for hepatitis C. Sexually transmitted infections (STIs)  Get screened for STIs, including gonorrhea and chlamydia, if: ? You are sexually active and are younger than 61 years of age. ? You are older than 61 years of age and your health care provider tells you that you are at risk for this type of infection. ?   Your sexual activity has changed since you were last screened, and you are at increased risk for chlamydia or gonorrhea. Ask your health care provider if you are at risk.  Ask your health care provider about whether you are at high risk for HIV. Your health care provider may recommend a prescription medicine to help prevent HIV infection. If you choose to take medicine to prevent HIV, you should first get tested for HIV.  You should then be tested every 3 months for as long as you are taking the medicine. Pregnancy  If you are about to stop having your period (premenopausal) and you may become pregnant, seek counseling before you get pregnant.  Take 400 to 800 micrograms (mcg) of folic acid every day if you become pregnant.  Ask for birth control (contraception) if you want to prevent pregnancy. Osteoporosis and menopause Osteoporosis is a disease in which the bones lose minerals and strength with aging. This can result in bone fractures. If you are 65 years old or older, or if you are at risk for osteoporosis and fractures, ask your health care provider if you should:  Be screened for bone loss.  Take a calcium or vitamin D supplement to lower your risk of fractures.  Be given hormone replacement therapy (HRT) to treat symptoms of menopause. Follow these instructions at home: Lifestyle  Do not use any products that contain nicotine or tobacco, such as cigarettes, e-cigarettes, and chewing tobacco. If you need help quitting, ask your health care provider.  Do not use street drugs.  Do not share needles.  Ask your health care provider for help if you need support or information about quitting drugs. Alcohol use  Do not drink alcohol if: ? Your health care provider tells you not to drink. ? You are pregnant, may be pregnant, or are planning to become pregnant.  If you drink alcohol: ? Limit how much you use to 0-1 drink a day. ? Limit intake if you are breastfeeding.  Be aware of how much alcohol is in your drink. In the [place] one drink equals one 12 oz bottle of beer (355 mL), one 5 oz glass of wine (148 mL), or one 1 oz glass of hard liquor (44 mL). General instructions  Schedule regular health, dental, and eye exams.  Stay current with your vaccines.  Tell your health care provider if: ? You often feel depressed. ? You have ever been abused or do not feel safe at  home. Summary  Adopting a healthy lifestyle and getting preventive care are important in promoting health and wellness.  Follow your health care provider's instructions about healthy diet, exercising, and getting tested or screened for diseases.  Follow your health care provider's instructions on monitoring your cholesterol and blood pressure. This information is not intended to replace advice given to you by your health care provider. Make sure you discuss any questions you have with your health care provider. Document Released: 10/04/2010 Document Revised: 03/14/2018 Document Reviewed: 03/14/2018 Elsevier Patient Education  2020 [employer]  

## 2019-03-06 LAB — LIPID PANEL W/O CHOL/HDL RATIO
Cholesterol, Total: 217 mg/dL — ABNORMAL HIGH (ref 100–199)
HDL: 52 mg/dL (ref >39)
LDL Chol Calc (NIH): 139 mg/dL — ABNORMAL HIGH (ref 0–99)
Triglycerides: 148 mg/dL (ref 0–149)
VLDL Cholesterol Cal: 26 mg/dL (ref 5–40)

## 2019-03-06 LAB — COMPREHENSIVE METABOLIC PANEL
ALT: 33 IU/L — ABNORMAL HIGH (ref 0–32)
AST: 29 IU/L (ref 0–40)
Albumin/Globulin Ratio: 1.9 (ref 1.2–2.2)
Albumin: 4.5 g/dL (ref 3.8–4.8)
Alkaline Phosphatase: 85 IU/L (ref 39–117)
BUN/Creatinine Ratio: 11 — ABNORMAL LOW (ref 12–28)
BUN: 12 mg/dL (ref 8–27)
Bilirubin Total: 0.7 mg/dL (ref 0.0–1.2)
CO2: 25 mmol/L (ref 20–29)
Calcium: 9.4 mg/dL (ref 8.7–10.3)
Chloride: 103 mmol/L (ref 96–106)
Creatinine, Ser: 1.05 mg/dL — ABNORMAL HIGH (ref 0.57–1.00)
GFR calc Af Amer: 66 mL/min/{1.73_m2} (ref >59)
GFR calc non Af Amer: 57 mL/min/{1.73_m2} — ABNORMAL LOW (ref >59)
Globulin, Total: 2.4 g/dL (ref 1.5–4.5)
Glucose: 76 mg/dL (ref 65–99)
Potassium: 4.2 mmol/L (ref 3.5–5.2)
Sodium: 140 mmol/L (ref 134–144)
Total Protein: 6.9 g/dL (ref 6.0–8.5)

## 2019-03-06 LAB — CBC WITH DIFFERENTIAL/PLATELET
Basophils Absolute: 0 10*3/uL (ref 0.0–0.2)
Basos: 1 %
EOS (ABSOLUTE): 0.1 10*3/uL (ref 0.0–0.4)
Eos: 2 %
Hematocrit: 39.8 % (ref 34.0–46.6)
Hemoglobin: 13 g/dL (ref 11.1–15.9)
Immature Grans (Abs): 0 10*3/uL (ref 0.0–0.1)
Immature Granulocytes: 0 %
Lymphocytes Absolute: 3.3 10*3/uL — ABNORMAL HIGH (ref 0.7–3.1)
Lymphs: 43 %
MCH: 29.1 pg (ref 26.6–33.0)
MCHC: 32.7 g/dL (ref 31.5–35.7)
MCV: 89 fL (ref 79–97)
Monocytes Absolute: 0.8 10*3/uL (ref 0.1–0.9)
Monocytes: 11 %
Neutrophils Absolute: 3.3 10*3/uL (ref 1.4–7.0)
Neutrophils: 43 %
Platelets: 249 10*3/uL (ref 150–450)
RBC: 4.47 x10E6/uL (ref 3.77–5.28)
RDW: 12.3 % (ref 11.7–15.4)
WBC: 7.6 10*3/uL (ref 3.4–10.8)

## 2019-03-06 LAB — VITAMIN D 25 HYDROXY (VIT D DEFICIENCY, FRACTURES): Vit D, 25-Hydroxy: 22 ng/mL — ABNORMAL LOW (ref 30.0–100.0)

## 2019-03-06 LAB — TSH: TSH: 0.813 u[IU]/mL (ref 0.450–4.500)

## 2019-03-08 ENCOUNTER — Encounter: Payer: Self-pay | Admitting: Family Medicine

## 2019-03-08 ENCOUNTER — Ambulatory Visit (INDEPENDENT_AMBULATORY_CARE_PROVIDER_SITE_OTHER): Payer: BC Managed Care – PPO | Admitting: Family Medicine

## 2019-03-08 ENCOUNTER — Other Ambulatory Visit: Payer: Self-pay

## 2019-03-08 ENCOUNTER — Other Ambulatory Visit: Payer: Self-pay | Admitting: Family Medicine

## 2019-03-08 VITALS — BP 132/81 | HR 90 | Temp 98.5°F

## 2019-03-08 DIAGNOSIS — M9903 Segmental and somatic dysfunction of lumbar region: Secondary | ICD-10-CM

## 2019-03-08 DIAGNOSIS — M9905 Segmental and somatic dysfunction of pelvic region: Secondary | ICD-10-CM

## 2019-03-08 DIAGNOSIS — M9901 Segmental and somatic dysfunction of cervical region: Secondary | ICD-10-CM

## 2019-03-08 DIAGNOSIS — M542 Cervicalgia: Secondary | ICD-10-CM | POA: Diagnosis not present

## 2019-03-08 DIAGNOSIS — E079 Disorder of thyroid, unspecified: Secondary | ICD-10-CM | POA: Diagnosis not present

## 2019-03-08 DIAGNOSIS — M9900 Segmental and somatic dysfunction of head region: Secondary | ICD-10-CM

## 2019-03-08 DIAGNOSIS — M9908 Segmental and somatic dysfunction of rib cage: Secondary | ICD-10-CM

## 2019-03-08 DIAGNOSIS — M9909 Segmental and somatic dysfunction of abdomen and other regions: Secondary | ICD-10-CM

## 2019-03-08 DIAGNOSIS — M9902 Segmental and somatic dysfunction of thoracic region: Secondary | ICD-10-CM

## 2019-03-08 DIAGNOSIS — M9904 Segmental and somatic dysfunction of sacral region: Secondary | ICD-10-CM | POA: Diagnosis not present

## 2019-03-08 MED ORDER — VITAMIN D (ERGOCALCIFEROL) 1.25 MG (50000 UNIT) PO CAPS: 50000.0000 [IU] | ORAL_CAPSULE | ORAL | 0 refills | Status: DC

## 2019-03-08 NOTE — Progress Notes (Signed)
BP 132/81   Pulse 90   Temp 98.5 F (36.9 C)   LMP  (LMP Unknown)   SpO2 100%    Subjective:    Patient ID: Tracie Sheppard, female    DOB: 06/29/1955, 63 y.o.   MRN: [id number]  HPI: Tracie Sheppard is a 63 y.o. female  Chief Complaint  Patient presents with  . Neck Pain   Has been feeling tight in her low back and her neck. She notes that its about stable, it hasn't gotten significantly better or worse. Pain is aching and sore. Better with OMT and medicine and worse with stress and being climbed on by her grandson. Pain is not radiating at this time. She is otherwise feeling well with no other concerns or complaints at this time.   Relevant past medical, surgical, family and social history reviewed and updated as indicated. Interim medical history since our last visit reviewed. Allergies and medications reviewed and updated.  Review of Systems  Constitutional: Negative.   Respiratory: Negative.   Cardiovascular: Negative.   Musculoskeletal: Positive for arthralgias, back pain, myalgias, neck pain and neck stiffness. Negative for gait problem and joint swelling.  Skin: Negative.   Neurological: Negative.   Psychiatric/Behavioral: Negative.     Per HPI unless specifically indicated above     Objective:    BP 132/81   Pulse 90   Temp 98.5 F (36.9 C)   LMP  (LMP Unknown)   SpO2 100%   Wt Readings from Last 3 Encounters:  03/05/19 206 lb 2 oz (93.5 kg)  01/01/19 205 lb (93 kg)  11/16/18 198 lb (89.8 kg)    Physical Exam Vitals signs and nursing note reviewed.  Constitutional:      General: She is not in acute distress.    Appearance: Normal appearance. She is not ill-appearing.  HENT:     Head: Normocephalic and atraumatic.     Right Ear: External ear normal.     Left Ear: External ear normal.     Nose: Nose normal.     Mouth/Throat:     Mouth: Mucous membranes are moist.     Pharynx: Oropharynx is clear.  Eyes:     Extraocular Movements: Extraocular  movements intact.     Conjunctiva/sclera: Conjunctivae normal.     Pupils: Pupils are equal, round, and reactive to light.  Neck:     Musculoskeletal: No muscular tenderness.     Vascular: No carotid bruit.  Cardiovascular:     Rate and Rhythm: Normal rate.     Pulses: Normal pulses.  Pulmonary:     Effort: Pulmonary effort is normal. No respiratory distress.  Abdominal:     General: Abdomen is flat. There is no distension.     Palpations: Abdomen is soft. There is no mass.     Tenderness: There is no abdominal tenderness. There is no right CVA tenderness, left CVA tenderness, guarding or rebound.     Hernia: No hernia is present.  Lymphadenopathy:     Cervical: No cervical adenopathy.  Skin:    General: Skin is warm and dry.     Capillary Refill: Capillary refill takes less than 2 seconds.     Coloration: Skin is not jaundiced or pale.     Findings: No bruising, erythema, lesion or rash.  Neurological:     General: No focal deficit present.     Mental Status: She is alert. Mental status is at baseline.  Psychiatric:  Mood and Affect: Mood normal.        Behavior: Behavior normal.        Thought Content: Thought content normal.        Judgment: Judgment normal.   Musculoskeletal:  Exam found Decreased ROM, Tissue texture changes, Tenderness to palpation and Asymmetry of patient's  head, neck, thorax, ribs, lumbar, pelvis, sacrum and abdomen Osteopathic Structural Exam:   Head:Hypertonic suboccipital muscles, OAESSR   Neck: C3ESRL  Thorax: T6-8SLRR  Ribs: Ribs 5-7locked up on the R  Lumbar: QL hypertonic on the L  Pelvis: anterior L innominate  Sacrum: L on L torsion  Abdomen: diaphragm hypertonic on the L   Results for orders placed or performed in visit on 03/05/19  Bayer DCA Hb A1c Waived  Result Value Ref Range   HB A1C (BAYER DCA - WAIVED) 5.6 <7.0 %  CBC with Differential OUT  Result Value Ref Range   WBC 7.6 3.4 - 10.8 x10E3/uL   RBC 4.47 3.77 - 5.28  x10E6/uL   Hemoglobin 13.0 11.1 - 15.9 g/dL   Hematocrit 39.8 34.0 - 46.6 %   MCV 89 79 - 97 fL   MCH 29.1 26.6 - 33.0 pg   MCHC 32.7 31.5 - 35.7 g/dL   RDW 12.3 11.7 - 15.4 %   Platelets 249 150 - 450 x10E3/uL   Neutrophils 43 Not Estab. %   Lymphs 43 Not Estab. %   Monocytes 11 Not Estab. %   Eos 2 Not Estab. %   Basos 1 Not Estab. %   Neutrophils Absolute 3.3 1.4 - 7.0 x10E3/uL   Lymphocytes Absolute 3.3 (H) 0.7 - 3.1 x10E3/uL   Monocytes Absolute 0.8 0.1 - 0.9 x10E3/uL   EOS (ABSOLUTE) 0.1 0.0 - 0.4 x10E3/uL   Basophils Absolute 0.0 0.0 - 0.2 x10E3/uL   Immature Granulocytes 0 Not Estab. %   Immature Grans (Abs) 0.0 0.0 - 0.1 x10E3/uL  Comp Met (CMET)  Result Value Ref Range   Glucose 76 65 - 99 mg/dL   BUN 12 8 - 27 mg/dL   Creatinine, Ser 1.05 (H) 0.57 - 1.00 mg/dL   GFR calc non Af Amer 57 (L) >59 mL/min/1.73   GFR calc Af Amer 66 >59 mL/min/1.73   BUN/Creatinine Ratio 11 (L) 12 - 28   Sodium 140 134 - 144 mmol/L   Potassium 4.2 3.5 - 5.2 mmol/L   Chloride 103 96 - 106 mmol/L   CO2 25 20 - 29 mmol/L   Calcium 9.4 8.7 - 10.3 mg/dL   Total Protein 6.9 6.0 - 8.5 g/dL   Albumin 4.5 3.8 - 4.8 g/dL   Globulin, Total 2.4 1.5 - 4.5 g/dL   Albumin/Globulin Ratio 1.9 1.2 - 2.2   Bilirubin Total 0.7 0.0 - 1.2 mg/dL   Alkaline Phosphatase 85 39 - 117 IU/L   AST 29 0 - 40 IU/L   ALT 33 (H) 0 - 32 IU/L  Lipid Panel w/o Chol/HDL Ratio OUT  Result Value Ref Range   Cholesterol, Total 217 (H) 100 - 199 mg/dL   Triglycerides 148 0 - 149 mg/dL   HDL 52 >39 mg/dL   VLDL Cholesterol Cal 26 5 - 40 mg/dL   LDL Chol Calc (NIH) 139 (H) 0 - 99 mg/dL  Microalbumin, Urine Waived  Result Value Ref Range   Microalb, Ur Waived 30 (H) 0 - 19 mg/L   Creatinine, Urine Waived 200 10 - 300 mg/dL   Microalb/Creat Ratio <30 <30 mg/g  TSH  Result Value Ref Range   TSH 0.813 0.450 - 4.500 uIU/mL  UA/M w/rflx Culture, Routine   Specimen: Blood   BLD  Result Value Ref Range   Specific  Gravity, UA 1.025 1.005 - 1.030   pH, UA 7.0 5.0 - 7.5   Color, UA Yellow Yellow   Appearance Ur Clear Clear   Leukocytes,UA Negative Negative   Protein,UA Negative Negative/Trace   Glucose, UA Negative Negative   Ketones, UA Trace (A) Negative   RBC, UA Negative Negative   Bilirubin, UA Negative Negative   Urobilinogen, Ur 1.0 0.2 - 1.0 mg/dL   Nitrite, UA Negative Negative  Vit D  25 hydroxy (rtn osteoporosis monitoring)  Result Value Ref Range   Vit D, 25-Hydroxy 22.0 (L) 30.0 - 100.0 ng/mL      Assessment & Plan:   Problem List Items Addressed This Visit      Endocrine   Thyroid disease    Will stop thyroid med and recheck about a month. Call with any concerns.       Relevant Orders   TSH    Other Visit Diagnoses    Neck pain    -  Primary   Stable. She does have some somatic dysfunction contributing to her symptoms. Treated today with good results as below. Call with any concerns.    Sacral region somatic dysfunction       Somatic dysfunction of pelvis region       Thoracic segment dysfunction       Rib cage region somatic dysfunction       Cervical somatic dysfunction       Head region somatic dysfunction       Lumbar region somatic dysfunction       Somatic dysfunction of abdominal region         After verbal consent was obtained, patient was treated today with osteopathic manipulative medicine to the regions of the head, neck, thorax, ribs, lumbar, pelvis, sacrum and abdomen using the techniques of cranial, myofascial release, counterstrain, muscle energy, HVLA and soft tissue. Areas of compensation relating to her primary pain source also treated. Patient tolerated the procedure well with good objective and good subjective improvement in symptoms. She left the room in good condition. She was advised to stay well hydrated and that she may have some soreness following the procedure. If not improving or worsening, she will call and come in. She will return for  reevaluation  In 3-4 weeks.   Follow up plan: Return in about 4 weeks (around 04/05/2019).

## 2019-03-08 NOTE — Assessment & Plan Note (Signed)
Will stop thyroid med and recheck about a month. Call with any concerns.

## 2019-04-04 ENCOUNTER — Other Ambulatory Visit: Payer: Self-pay | Admitting: Family Medicine

## 2019-04-16 ENCOUNTER — Ambulatory Visit: Payer: Self-pay | Admitting: Family Medicine

## 2019-04-23 ENCOUNTER — Ambulatory Visit: Payer: Self-pay | Admitting: Gastroenterology

## 2019-04-30 ENCOUNTER — Other Ambulatory Visit: Payer: Self-pay | Admitting: Family Medicine

## 2019-05-07 ENCOUNTER — Encounter: Payer: Self-pay | Admitting: Family Medicine

## 2019-05-07 ENCOUNTER — Ambulatory Visit: Payer: BC Managed Care – PPO | Admitting: Family Medicine

## 2019-05-07 ENCOUNTER — Other Ambulatory Visit: Payer: Self-pay

## 2019-05-07 VITALS — BP 140/83 | HR 84 | Temp 98.0°F

## 2019-05-07 DIAGNOSIS — M545 Low back pain, unspecified: Secondary | ICD-10-CM

## 2019-05-07 DIAGNOSIS — M9905 Segmental and somatic dysfunction of pelvic region: Secondary | ICD-10-CM | POA: Diagnosis not present

## 2019-05-07 DIAGNOSIS — M9903 Segmental and somatic dysfunction of lumbar region: Secondary | ICD-10-CM

## 2019-05-07 DIAGNOSIS — M9904 Segmental and somatic dysfunction of sacral region: Secondary | ICD-10-CM

## 2019-05-07 DIAGNOSIS — M9909 Segmental and somatic dysfunction of abdomen and other regions: Secondary | ICD-10-CM

## 2019-05-07 DIAGNOSIS — M9908 Segmental and somatic dysfunction of rib cage: Secondary | ICD-10-CM

## 2019-05-07 DIAGNOSIS — M9902 Segmental and somatic dysfunction of thoracic region: Secondary | ICD-10-CM | POA: Diagnosis not present

## 2019-05-07 DIAGNOSIS — M9906 Segmental and somatic dysfunction of lower extremity: Secondary | ICD-10-CM

## 2019-05-07 NOTE — Patient Instructions (Signed)
Diclofenac gel

## 2019-05-07 NOTE — Assessment & Plan Note (Signed)
Due to falling down the stairs. She has somatic dysfuntion that is contributing to her symptoms. Treated today with good results as below. Call with any concern

## 2019-05-07 NOTE — Progress Notes (Signed)
BP 140/83   Pulse 84   Temp 98 F (36.7 C)   LMP  (LMP Unknown)   SpO2 95%    Subjective:    Patient ID: Tracie Sheppard, female    DOB: [birth year], 64 y.o.   MRN: [id number]  HPI: Tracie Sheppard is a 64 y.o. female  Chief Complaint  Patient presents with  . Back Pain   Slipped down the stairs about a month ago. Irritated the spot on her wrist, so going to have steroids again. Pain is in her low back worse with sitting or standing for too long. Pain is not going down her legs. Her hands have been tingling a little bit more again. Pain is aching and sore. Nothing really makes it better, stretching helps a little. Not worse at any time of day- maybe at night. No other concerns or complaints at this time.   Relevant past medical, surgical, family and social history reviewed and updated as indicated. Interim medical history since our last visit reviewed. Allergies and medications reviewed and updated.  Review of Systems  Constitutional: Negative.   Respiratory: Negative.   Cardiovascular: Negative.   Musculoskeletal: Positive for arthralgias and myalgias. Negative for back pain, gait problem, joint swelling, neck pain and neck stiffness.  Skin: Negative.   Neurological: Negative.   Psychiatric/Behavioral: Negative.     Per HPI unless specifically indicated above     Objective:    BP 140/83   Pulse 84   Temp 98 F (36.7 C)   LMP  (LMP Unknown)   SpO2 95%   Wt Readings from Last 3 Encounters:  03/05/19 206 lb 2 oz (93.5 kg)  01/01/19 205 lb (93 kg)  11/16/18 198 lb (89.8 kg)    Physical Exam Vitals and nursing note reviewed.  Constitutional:      General: She is not in acute distress.    Appearance: Normal appearance. She is not ill-appearing, toxic-appearing or diaphoretic.  HENT:     Head: Normocephalic and atraumatic.     Right Ear: External ear normal.     Left Ear: External ear normal.     Nose: Nose normal.     Mouth/Throat:     Mouth: Mucous membranes are  moist.     Pharynx: Oropharynx is clear.  Eyes:     General: No scleral icterus.       Right eye: No discharge.        Left eye: No discharge.     Extraocular Movements: Extraocular movements intact.     Conjunctiva/sclera: Conjunctivae normal.     Pupils: Pupils are equal, round, and reactive to light.  Cardiovascular:     Rate and Rhythm: Normal rate and regular rhythm.     Pulses: Normal pulses.     Heart sounds: Normal heart sounds. No murmur. No friction rub. No gallop.   Pulmonary:     Effort: Pulmonary effort is normal. No respiratory distress.     Breath sounds: Normal breath sounds. No stridor. No wheezing, rhonchi or rales.  Chest:     Chest wall: No tenderness.  Musculoskeletal:        General: Normal range of motion.     Cervical back: Normal range of motion and neck supple.  Skin:    General: Skin is warm and dry.     Capillary Refill: Capillary refill takes less than 2 seconds.     Coloration: Skin is not jaundiced or pale.     Findings: No  bruising, erythema, lesion or rash.  Neurological:     General: No focal deficit present.     Mental Status: She is alert and oriented to person, place, and time. Mental status is at baseline.  Psychiatric:        Mood and Affect: Mood normal.        Behavior: Behavior normal.        Thought Content: Thought content normal.        Judgment: Judgment normal.   Musculoskeletal:  Exam found Decreased ROM, Tissue texture changes, Tenderness to palpation and Asymmetry of patient's  thorax, ribs, lumbar, pelvis, sacrum, lower extremity and abdomen Osteopathic Structural Exam:   Thorax: T5-6SLRR  Ribs: Ribs 4-5 locked up on the R  Lumbar: QL hypertonic on the R  Pelvis:Anterior R innominate  Sacrum: R on L torsion  Lower Extremity: IT hypertonic on the R  Abdomen: diaphragm hypertonic on the L   Results for orders placed or performed in visit on 03/05/19  Bayer DCA Hb A1c Waived  Result Value Ref Range   HB A1C (BAYER DCA -  WAIVED) 5.6 <7.0 %  CBC with Differential OUT  Result Value Ref Range   WBC 7.6 3.4 - 10.8 x10E3/uL   RBC 4.47 3.77 - 5.28 x10E6/uL   Hemoglobin 13.0 11.1 - 15.9 g/dL   Hematocrit 39.8 34.0 - 46.6 %   MCV 89 79 - 97 fL   MCH 29.1 26.6 - 33.0 pg   MCHC 32.7 31.5 - 35.7 g/dL   RDW 12.3 11.7 - 15.4 %   Platelets 249 150 - 450 x10E3/uL   Neutrophils 43 Not Estab. %   Lymphs 43 Not Estab. %   Monocytes 11 Not Estab. %   Eos 2 Not Estab. %   Basos 1 Not Estab. %   Neutrophils Absolute 3.3 1.4 - 7.0 x10E3/uL   Lymphocytes Absolute 3.3 (H) 0.7 - 3.1 x10E3/uL   Monocytes Absolute 0.8 0.1 - 0.9 x10E3/uL   EOS (ABSOLUTE) 0.1 0.0 - 0.4 x10E3/uL   Basophils Absolute 0.0 0.0 - 0.2 x10E3/uL   Immature Granulocytes 0 Not Estab. %   Immature Grans (Abs) 0.0 0.0 - 0.1 x10E3/uL  Comp Met (CMET)  Result Value Ref Range   Glucose 76 65 - 99 mg/dL   BUN 12 8 - 27 mg/dL   Creatinine, Ser 1.05 (H) 0.57 - 1.00 mg/dL   GFR calc non Af Amer 57 (L) >59 mL/min/1.73   GFR calc Af Amer 66 >59 mL/min/1.73   BUN/Creatinine Ratio 11 (L) 12 - 28   Sodium 140 134 - 144 mmol/L   Potassium 4.2 3.5 - 5.2 mmol/L   Chloride 103 96 - 106 mmol/L   CO2 25 20 - 29 mmol/L   Calcium 9.4 8.7 - 10.3 mg/dL   Total Protein 6.9 6.0 - 8.5 g/dL   Albumin 4.5 3.8 - 4.8 g/dL   Globulin, Total 2.4 1.5 - 4.5 g/dL   Albumin/Globulin Ratio 1.9 1.2 - 2.2   Bilirubin Total 0.7 0.0 - 1.2 mg/dL   Alkaline Phosphatase 85 39 - 117 IU/L   AST 29 0 - 40 IU/L   ALT 33 (H) 0 - 32 IU/L  Lipid Panel w/o Chol/HDL Ratio OUT  Result Value Ref Range   Cholesterol, Total 217 (H) 100 - 199 mg/dL   Triglycerides 148 0 - 149 mg/dL   HDL 52 >39 mg/dL   VLDL Cholesterol Cal 26 5 - 40 mg/dL   LDL Chol Calc (NIH)  139 (H) 0 - 99 mg/dL  Microalbumin, Urine Waived  Result Value Ref Range   Microalb, Ur Waived 30 (H) 0 - 19 mg/L   Creatinine, Urine Waived 200 10 - 300 mg/dL   Microalb/Creat Ratio <30 <30 mg/g  TSH  Result Value Ref Range   TSH  0.813 0.450 - 4.500 uIU/mL  UA/M w/rflx Culture, Routine   Specimen: Blood   BLD  Result Value Ref Range   Specific Gravity, UA 1.025 1.005 - 1.030   pH, UA 7.0 5.0 - 7.5   Color, UA Yellow Yellow   Appearance Ur Clear Clear   Leukocytes,UA Negative Negative   Protein,UA Negative Negative/Trace   Glucose, UA Negative Negative   Ketones, UA Trace (A) Negative   RBC, UA Negative Negative   Bilirubin, UA Negative Negative   Urobilinogen, Ur 1.0 0.2 - 1.0 mg/dL   Nitrite, UA Negative Negative  Vit D  25 hydroxy (rtn osteoporosis monitoring)  Result Value Ref Range   Vit D, 25-Hydroxy 22.0 (L) 30.0 - 100.0 ng/mL      Assessment & Plan:   Problem List Items Addressed This Visit      Other   Low back pain - Primary    Due to falling down the stairs. She has somatic dysfuntion that is contributing to her symptoms. Treated today with good results as below. Call with any concern      Relevant Medications   acetaminophen (TYLENOL) 500 MG tablet    Other Visit Diagnoses    Sacral region somatic dysfunction       Somatic dysfunction of pelvis region       Thoracic segment dysfunction       Rib cage region somatic dysfunction       Lumbar region somatic dysfunction       Somatic dysfunction of abdominal region       Somatic dysfunction of lower extremity         After verbal consent was obtained, patient was treated today with osteopathic manipulative medicine to the regions of the thorax, ribs, lumbar, pelvis, sacrum, abdomen and lower extremity using the techniques of myofascial release, counterstrain, muscle energy, HVLA and soft tissue. Areas of compensation relating to her primary pain source also treated. Patient tolerated the procedure well with good objective and good subjective improvement in symptoms. She left the room in good condition. He was advised to stay well hydrated and that she may have some soreness following the procedure. If not improving or worsening, she will call  and come in. She will return for reevaluation  in 1-2 months.   Follow up plan: Return in about 4 weeks (around 06/04/2019).

## 2019-05-27 ENCOUNTER — Other Ambulatory Visit: Payer: Self-pay

## 2019-05-27 ENCOUNTER — Ambulatory Visit: Payer: BC Managed Care – PPO | Attending: Internal Medicine

## 2019-05-27 DIAGNOSIS — Z20822 Contact with and (suspected) exposure to covid-19: Secondary | ICD-10-CM

## 2019-05-28 LAB — NOVEL CORONAVIRUS, NAA: SARS-CoV-2, NAA: NOT DETECTED

## 2019-06-17 ENCOUNTER — Ambulatory Visit: Payer: BC Managed Care – PPO

## 2019-06-18 ENCOUNTER — Encounter: Payer: Self-pay | Admitting: Family Medicine

## 2019-06-18 ENCOUNTER — Other Ambulatory Visit: Payer: Self-pay

## 2019-06-18 ENCOUNTER — Ambulatory Visit (INDEPENDENT_AMBULATORY_CARE_PROVIDER_SITE_OTHER): Payer: BC Managed Care – PPO | Admitting: Family Medicine

## 2019-06-18 VITALS — BP 129/83 | HR 92 | Temp 98.5°F

## 2019-06-18 DIAGNOSIS — R7301 Impaired fasting glucose: Secondary | ICD-10-CM | POA: Diagnosis not present

## 2019-06-18 DIAGNOSIS — R1310 Dysphagia, unspecified: Secondary | ICD-10-CM | POA: Diagnosis not present

## 2019-06-18 DIAGNOSIS — E079 Disorder of thyroid, unspecified: Secondary | ICD-10-CM | POA: Diagnosis not present

## 2019-06-18 DIAGNOSIS — E559 Vitamin D deficiency, unspecified: Secondary | ICD-10-CM

## 2019-06-18 DIAGNOSIS — R0602 Shortness of breath: Secondary | ICD-10-CM | POA: Diagnosis not present

## 2019-06-18 MED ORDER — ALBUTEROL SULFATE HFA 108 (90 BASE) MCG/ACT IN AERS: 2.0000 | INHALATION_SPRAY | Freq: Four times a day (QID) | RESPIRATORY_TRACT | 3 refills | Status: DC | PRN

## 2019-06-18 NOTE — Progress Notes (Signed)
BP 129/83 (BP Location: Left Arm, Patient Position: Sitting, Cuff Size: Normal)   Pulse 92   Temp 98.5 F (36.9 C) (Oral)   LMP  (LMP Unknown)   SpO2 97%    Subjective:    Patient ID: Tracie Sheppard, female    DOB: [birth year], 64 y.o.   MRN: [id number]  HPI: DESHAWN MABRAY is a 64 y.o. female  Chief Complaint  Patient presents with  . Thyroid Problem  . Shortness of Breath  . Fall   HYPOTHYROIDISM- was on the low end of normal on her TSH, so we stopped her medicine about a month ago.  Thyroid control status:stable Satisfied with current treatment? yes Recent dose adjustment:yes Fatigue: yes Cold intolerance: no Heat intolerance: no Weight gain: yes Weight loss: no Constipation: yes Diarrhea/loose stools: no Palpitations: yes Lower extremity edema: no Anxiety/depressed mood: yes- stable  SHORTNESS OF BREATH Duration: unsure, about a month Onset: gradual Description of breathing discomfort: breathing hard, taking deep breaths Severity: mild Episode duration: constant Frequency: constant Related to exertion: no Cough: no Chest tightness: yes Wheezing: no Fevers: no Chest pain: no Palpitations: no  Nausea: yes Diaphoresis: no Deconditioning: yes Status: stable Aggravating factors: running around after her grandson Alleviating factors: nothing  -Tripped while carrying a bunch of stuff while going up the stairs. Didn't hurt herself. Did not bump her head. Doing OK since then.   Relevant past medical, surgical, family and social history reviewed and updated as indicated. Interim medical history since our last visit reviewed. Allergies and medications reviewed and updated.  Review of Systems  Constitutional: Positive for fatigue. Negative for activity change, appetite change, chills, diaphoresis, fever and unexpected weight change.  HENT: Negative.   Respiratory: Positive for chest tightness and shortness of breath. Negative for apnea, cough, choking,  wheezing and stridor.   Cardiovascular: Negative.   Gastrointestinal: Negative.   Musculoskeletal: Negative.   Skin: Negative.   Neurological: Negative.   Psychiatric/Behavioral: Negative for agitation, behavioral problems, confusion, decreased concentration, dysphoric mood, hallucinations, self-injury, sleep disturbance and suicidal ideas. The patient is nervous/anxious. The patient is not hyperactive.     Per HPI unless specifically indicated above     Objective:    BP 129/83 (BP Location: Left Arm, Patient Position: Sitting, Cuff Size: Normal)   Pulse 92   Temp 98.5 F (36.9 C) (Oral)   LMP  (LMP Unknown)   SpO2 97%   Wt Readings from Last 3 Encounters:  03/05/19 206 lb 2 oz (93.5 kg)  01/01/19 205 lb (93 kg)  11/16/18 198 lb (89.8 kg)    Physical Exam Vitals and nursing note reviewed.  Constitutional:      General: She is not in acute distress.    Appearance: Normal appearance. She is not ill-appearing, toxic-appearing or diaphoretic.  HENT:     Head: Normocephalic and atraumatic.     Right Ear: External ear normal.     Left Ear: External ear normal.     Nose: Nose normal.     Mouth/Throat:     Mouth: Mucous membranes are moist.     Pharynx: Oropharynx is clear.  Eyes:     General: No scleral icterus.       Right eye: No discharge.        Left eye: No discharge.     Extraocular Movements: Extraocular movements intact.     Conjunctiva/sclera: Conjunctivae normal.     Pupils: Pupils are equal, round, and reactive to light.  Cardiovascular:  Rate and Rhythm: Normal rate and regular rhythm.     Pulses: Normal pulses.     Heart sounds: Normal heart sounds. No murmur. No friction rub. No gallop.   Pulmonary:     Effort: Pulmonary effort is normal. No respiratory distress.     Breath sounds: Normal breath sounds. No stridor. No wheezing, rhonchi or rales.  Chest:     Chest wall: No tenderness.  Musculoskeletal:        General: Normal range of motion.      Cervical back: Normal range of motion and neck supple.  Skin:    General: Skin is warm and dry.     Capillary Refill: Capillary refill takes less than 2 seconds.     Coloration: Skin is not jaundiced or pale.     Findings: No bruising, erythema, lesion or rash.  Neurological:     General: No focal deficit present.     Mental Status: She is alert and oriented to person, place, and time. Mental status is at baseline.  Psychiatric:        Mood and Affect: Mood normal.        Behavior: Behavior normal.        Thought Content: Thought content normal.        Judgment: Judgment normal.     Results for orders placed or performed in visit on 05/27/19  Novel Coronavirus, NAA ([facility])   Specimen: Nasopharyngeal(NP) swabs in vial transport medium   NASOPHARYNGE  TESTING  Result Value Ref Range   SARS-CoV-2, NAA Not Detected Not Detected      Assessment & Plan:   Problem List Items Addressed This Visit      Digestive   Dysphagia    Encouraged her to follow up GI. Call with any concerns.         Endocrine   Thyroid disease    Rechecking levels today. Likely does not need meds. Await results. Treat as needed.       IFG (impaired fasting glucose)   Relevant Orders   Microalbumin, Urine Waived    Other Visit Diagnoses    SOB (shortness of breath)    -  Primary   EKG normal. Likely stress. Will check labs and start albuterol and recheck 1 month. Call with any concerns. Continue diet and exercise.    Relevant Orders   EKG 12-Lead (Completed)   CBC with Differential/Platelet   Comprehensive metabolic panel   Vitamin D deficiency       Labs drawn today. Await results.    Relevant Orders   VITAMIN D 25 Hydroxy (Vit-D Deficiency, Fractures)       Follow up plan: Return in about 4 weeks (around 07/16/2019).

## 2019-06-18 NOTE — Assessment & Plan Note (Signed)
Encouraged her to follow up GI. Call with any concerns.

## 2019-06-18 NOTE — Assessment & Plan Note (Signed)
Rechecking levels today. Likely does not need meds. Await results. Treat as needed.

## 2019-06-19 ENCOUNTER — Other Ambulatory Visit: Payer: Self-pay | Admitting: Family Medicine

## 2019-06-19 LAB — CBC WITH DIFFERENTIAL/PLATELET
Basophils Absolute: 0 10*3/uL (ref 0.0–0.2)
Basos: 1 %
EOS (ABSOLUTE): 0.1 10*3/uL (ref 0.0–0.4)
Eos: 1 %
Hematocrit: 39 % (ref 34.0–46.6)
Hemoglobin: 12.9 g/dL (ref 11.1–15.9)
Immature Grans (Abs): 0 10*3/uL (ref 0.0–0.1)
Immature Granulocytes: 0 %
Lymphocytes Absolute: 3.3 10*3/uL — ABNORMAL HIGH (ref 0.7–3.1)
Lymphs: 43 %
MCH: 29.5 pg (ref 26.6–33.0)
MCHC: 33.1 g/dL (ref 31.5–35.7)
MCV: 89 fL (ref 79–97)
Monocytes Absolute: 0.8 10*3/uL (ref 0.1–0.9)
Monocytes: 10 %
Neutrophils Absolute: 3.4 10*3/uL (ref 1.4–7.0)
Neutrophils: 45 %
Platelets: 267 10*3/uL (ref 150–450)
RBC: 4.37 x10E6/uL (ref 3.77–5.28)
RDW: 12.5 % (ref 11.7–15.4)
WBC: 7.6 10*3/uL (ref 3.4–10.8)

## 2019-06-19 LAB — COMPREHENSIVE METABOLIC PANEL
ALT: 32 IU/L (ref 0–32)
AST: 26 IU/L (ref 0–40)
Albumin/Globulin Ratio: 1.7 (ref 1.2–2.2)
Albumin: 4.5 g/dL (ref 3.8–4.8)
Alkaline Phosphatase: 75 IU/L (ref 39–117)
BUN/Creatinine Ratio: 15 (ref 12–28)
BUN: 16 mg/dL (ref 8–27)
Bilirubin Total: 0.9 mg/dL (ref 0.0–1.2)
CO2: 25 mmol/L (ref 20–29)
Calcium: 9.7 mg/dL (ref 8.7–10.3)
Chloride: 102 mmol/L (ref 96–106)
Creatinine, Ser: 1.09 mg/dL — ABNORMAL HIGH (ref 0.57–1.00)
GFR calc Af Amer: 63 mL/min/{1.73_m2} (ref >59)
GFR calc non Af Amer: 55 mL/min/{1.73_m2} — ABNORMAL LOW (ref >59)
Globulin, Total: 2.6 g/dL (ref 1.5–4.5)
Glucose: 83 mg/dL (ref 65–99)
Potassium: 4.2 mmol/L (ref 3.5–5.2)
Sodium: 140 mmol/L (ref 134–144)
Total Protein: 7.1 g/dL (ref 6.0–8.5)

## 2019-06-19 LAB — TSH: TSH: 1.44 u[IU]/mL (ref 0.450–4.500)

## 2019-06-19 LAB — MICROALBUMIN, URINE WAIVED
Creatinine, Urine Waived: 300 mg/dL (ref 10–300)
Microalb, Ur Waived: 80 mg/L — ABNORMAL HIGH (ref 0–19)

## 2019-06-19 LAB — VITAMIN D 25 HYDROXY (VIT D DEFICIENCY, FRACTURES): Vit D, 25-Hydroxy: 41.1 ng/mL (ref 30.0–100.0)

## 2019-07-23 ENCOUNTER — Encounter: Payer: Self-pay | Admitting: Family Medicine

## 2019-07-23 ENCOUNTER — Other Ambulatory Visit: Payer: Self-pay

## 2019-07-23 ENCOUNTER — Ambulatory Visit (INDEPENDENT_AMBULATORY_CARE_PROVIDER_SITE_OTHER): Payer: BC Managed Care – PPO | Admitting: Family Medicine

## 2019-07-23 VITALS — BP 138/77 | HR 76 | Temp 98.2°F | Wt 207.6 lb

## 2019-07-23 DIAGNOSIS — R0989 Other specified symptoms and signs involving the circulatory and respiratory systems: Secondary | ICD-10-CM | POA: Diagnosis not present

## 2019-07-23 DIAGNOSIS — R1310 Dysphagia, unspecified: Secondary | ICD-10-CM

## 2019-07-23 DIAGNOSIS — R0602 Shortness of breath: Secondary | ICD-10-CM

## 2019-07-23 NOTE — Progress Notes (Signed)
BP 138/77 (BP Location: Left Arm, Patient Position: Sitting, Cuff Size: Normal)   Pulse 76   Temp 98.2 F (36.8 C) (Oral)   Wt 207 lb 9.6 oz (94.2 kg)   LMP  (LMP Unknown)   SpO2 97%   BMI 35.01 kg/m    Subjective:    Patient ID: Tracie Sheppard, female    DOB: February 04, 1956, 64 y.o.   MRN: [id number]  HPI: Tracie Sheppard is a 64 y.o. female  Chief Complaint  Patient presents with  . Shortness of Breath  . Advice Only    Vit D. & Throat closing sensation   Breathing is doing better. SOB has resolved. She is using her inhaler as needed and it really seems like it's been helping. No concerns with her breathing today. She notes that she continues with joint pain and muscle aches. She notes that it's hard to move when she gets up and tries to move around. She has been dealing with it OK, but is feeling aching.   She notes that she is still having issues with the feeling of a lump in her throat. She saw GI and was supposed to follow back up with them. She didn't want to go back to see the GI doctor she saw previously. She'd like to see someone else. She doesn't have any trouble with food or liquids getting stuck- more of an issue with feeling a lump in her throat. No weight loss. No coughing. She is otherwise feeling well with no other concerns or complaints at this time.   Relevant past medical, surgical, family and social history reviewed and updated as indicated. Interim medical history since our last visit reviewed. Allergies and medications reviewed and updated.  Review of Systems  Constitutional: Negative.   Respiratory: Negative.   Cardiovascular: Negative.   Gastrointestinal: Negative.   Musculoskeletal: Positive for arthralgias, back pain and myalgias. Negative for gait problem, joint swelling, neck pain and neck stiffness.  Skin: Negative.   Neurological: Negative.   Psychiatric/Behavioral: Negative.     Per HPI unless specifically indicated above     Objective:    BP  138/77 (BP Location: Left Arm, Patient Position: Sitting, Cuff Size: Normal)   Pulse 76   Temp 98.2 F (36.8 C) (Oral)   Wt 207 lb 9.6 oz (94.2 kg)   LMP  (LMP Unknown)   SpO2 97%   BMI 35.01 kg/m   Wt Readings from Last 3 Encounters:  07/23/19 207 lb 9.6 oz (94.2 kg)  03/05/19 206 lb 2 oz (93.5 kg)  01/01/19 205 lb (93 kg)    Physical Exam Vitals and nursing note reviewed.  Constitutional:      General: She is not in acute distress.    Appearance: Normal appearance. She is not ill-appearing, toxic-appearing or diaphoretic.  HENT:     Head: Normocephalic and atraumatic.     Right Ear: External ear normal.     Left Ear: External ear normal.     Nose: Nose normal.     Mouth/Throat:     Mouth: Mucous membranes are moist.     Pharynx: Oropharynx is clear.  Eyes:     General: No scleral icterus.       Right eye: No discharge.        Left eye: No discharge.     Extraocular Movements: Extraocular movements intact.     Conjunctiva/sclera: Conjunctivae normal.     Pupils: Pupils are equal, round, and reactive to light.  Cardiovascular:     Rate and Rhythm: Normal rate and regular rhythm.     Pulses: Normal pulses.     Heart sounds: Normal heart sounds. No murmur. No friction rub. No gallop.   Pulmonary:     Effort: Pulmonary effort is normal. No respiratory distress.     Breath sounds: Normal breath sounds. No stridor. No wheezing, rhonchi or rales.  Chest:     Chest wall: No tenderness.  Musculoskeletal:        General: Normal range of motion.     Cervical back: Normal range of motion and neck supple.  Skin:    General: Skin is warm and dry.     Capillary Refill: Capillary refill takes less than 2 seconds.     Coloration: Skin is not jaundiced or pale.     Findings: No bruising, erythema, lesion or rash.  Neurological:     General: No focal deficit present.     Mental Status: She is alert and oriented to person, place, and time. Mental status is at baseline.    Psychiatric:        Mood and Affect: Mood normal.        Behavior: Behavior normal.        Thought Content: Thought content normal.        Judgment: Judgment normal.     Results for orders placed or performed in visit on 06/19/19  Microalbumin, Urine Waived  Result Value Ref Range   Microalb, Ur Waived 80 (H) 0 - 19 mg/L   Creatinine, Urine Waived 300 10 - 300 mg/dL   Microalb/Creat Ratio 30-300 (H) <30 mg/g      Assessment & Plan:   Problem List Items Addressed This Visit      Digestive   Dysphagia    Does not want to go back to her previous GI doctor. She would like to get a 2nd opinion. Referral generated today. Call with any concerns.       Relevant Orders   Ambulatory referral to Gastroenterology    Other Visit Diagnoses    Globus sensation    -  Primary   Would like to get 2nd opinion. Referral generated today. Await their input. Call with any concerns.    Relevant Orders   Ambulatory referral to Gastroenterology   SOB (shortness of breath)       Resolved. Lungs clear. Continue to monitor. Continue inhalers. Call with any concerns.        Follow up plan: Return if symptoms worsen or fail to improve.

## 2019-07-26 ENCOUNTER — Encounter: Payer: Self-pay | Admitting: Family Medicine

## 2019-07-26 NOTE — Assessment & Plan Note (Signed)
Does not want to go back to her previous GI doctor. She would like to get a 2nd opinion. Referral generated today. Call with any concerns.

## 2019-07-30 ENCOUNTER — Other Ambulatory Visit: Payer: Self-pay | Admitting: Family Medicine

## 2019-07-30 NOTE — Telephone Encounter (Signed)
Requested Prescriptions  Pending Prescriptions Disp Refills  . citalopram (CELEXA) 40 MG tablet [Pharmacy Med Name: CITALOPRAM HBR 40 MG TABLET] 135 tablet 1    Sig: TAKE 1.5 TABLETS (60 MG TOTAL) BY MOUTH DAILY.     Psychiatry:  Antidepressants - SSRI Passed - 07/30/2019  2:42 AM      Passed - Completed PHQ-2 or PHQ-9 in the last 360 days.      Passed - Valid encounter within last 6 months    Recent Outpatient Visits          1 week ago Globus sensation   [place], Megan P, DO   1 month ago SOB (shortness of breath)   [place], DO   2 months ago Acute right-sided low back pain without sciatica   [place], Megan P, DO   4 months ago Neck pain   [place], DO   4 months ago Routine general medical examination at a health care facility   [place], Barb Merino, DO      Future Appointments            In 2 days Virgel Manifold, MD [place]

## 2019-08-01 ENCOUNTER — Telehealth: Payer: BC Managed Care – PPO | Admitting: Gastroenterology

## 2019-08-12 ENCOUNTER — Other Ambulatory Visit: Payer: Self-pay

## 2019-08-12 ENCOUNTER — Ambulatory Visit (INDEPENDENT_AMBULATORY_CARE_PROVIDER_SITE_OTHER): Payer: BC Managed Care – PPO | Admitting: Family Medicine

## 2019-08-12 ENCOUNTER — Encounter: Payer: Self-pay | Admitting: Family Medicine

## 2019-08-12 VITALS — BP 137/79 | HR 81 | Temp 97.7°F | Ht 64.0 in | Wt 209.6 lb

## 2019-08-12 DIAGNOSIS — M545 Low back pain, unspecified: Secondary | ICD-10-CM

## 2019-08-12 DIAGNOSIS — M9901 Segmental and somatic dysfunction of cervical region: Secondary | ICD-10-CM

## 2019-08-12 DIAGNOSIS — M9909 Segmental and somatic dysfunction of abdomen and other regions: Secondary | ICD-10-CM

## 2019-08-12 DIAGNOSIS — M9900 Segmental and somatic dysfunction of head region: Secondary | ICD-10-CM

## 2019-08-12 DIAGNOSIS — M9905 Segmental and somatic dysfunction of pelvic region: Secondary | ICD-10-CM | POA: Diagnosis not present

## 2019-08-12 DIAGNOSIS — M9903 Segmental and somatic dysfunction of lumbar region: Secondary | ICD-10-CM | POA: Diagnosis not present

## 2019-08-12 DIAGNOSIS — M9908 Segmental and somatic dysfunction of rib cage: Secondary | ICD-10-CM

## 2019-08-12 DIAGNOSIS — M9904 Segmental and somatic dysfunction of sacral region: Secondary | ICD-10-CM

## 2019-08-12 DIAGNOSIS — M9902 Segmental and somatic dysfunction of thoracic region: Secondary | ICD-10-CM

## 2019-08-12 DIAGNOSIS — G8929 Other chronic pain: Secondary | ICD-10-CM

## 2019-08-12 NOTE — Progress Notes (Signed)
BP 137/79 (BP Location: Left Arm, Patient Position: Sitting, Cuff Size: Normal)   Pulse 81   Temp 97.7 F (36.5 C) (Oral)   Ht 5\' 4"  (1.626 m)   Wt 209 lb 9.6 oz (95.1 kg)   LMP  (LMP Unknown)   SpO2 98%   BMI 35.98 kg/m    Subjective:    Patient ID: Tracie Sheppard, female    DOB: [birth year], 64 y.o.   MRN: [id number]  HPI: Tracie Sheppard is a 64 y.o. female  Chief Complaint  Patient presents with  . Back Pain    OMM  . Hip Pain    OMM  . Shoulder Pain    OMM   Tracie Sheppard presents today for evaluation and possible treatment with OMT. She notes that she has been having pain when she has been sitting for a while and that she is feeling more stiff. She notes that there has not been anything big and crazy or big changes with her pain. Her pain has been going on for the past couple of weeks and is mainly in her low back, her R hip and her R shoulder. She notes that it is better with movement. Worse with sitting for an extended period. No radiation. She is still having numbness and tingling in her hands. She is otherwise been doing well with no other concerns or complaints at this time.    Relevant past medical, surgical, family and social history reviewed and updated as indicated. Interim medical history since our last visit reviewed. Allergies and medications reviewed and updated.  Review of Systems  Constitutional: Negative.   HENT: Negative.   Respiratory: Negative.   Cardiovascular: Negative.   Gastrointestinal: Negative.   Musculoskeletal: Positive for arthralgias, back pain and myalgias. Negative for gait problem, joint swelling, neck pain and neck stiffness.  Skin: Negative.   Neurological: Negative.   Psychiatric/Behavioral: Negative.     Per HPI unless specifically indicated above     Objective:    BP 137/79 (BP Location: Left Arm, Patient Position: Sitting, Cuff Size: Normal)   Pulse 81   Temp 97.7 F (36.5 C) (Oral)   Ht 5\' 4"  (1.626 m)   Wt 209 lb 9.6 oz  (95.1 kg)   LMP  (LMP Unknown)   SpO2 98%   BMI 35.98 kg/m   Wt Readings from Last 3 Encounters:  08/12/19 209 lb 9.6 oz (95.1 kg)  07/23/19 207 lb 9.6 oz (94.2 kg)  03/05/19 206 lb 2 oz (93.5 kg)    Physical Exam Vitals and nursing note reviewed.  Constitutional:      General: She is not in acute distress.    Appearance: Normal appearance. She is not ill-appearing.  HENT:     Head: Normocephalic and atraumatic.     Right Ear: External ear normal.     Left Ear: External ear normal.     Nose: Nose normal.     Mouth/Throat:     Mouth: Mucous membranes are moist.     Pharynx: Oropharynx is clear.  Eyes:     Extraocular Movements: Extraocular movements intact.     Conjunctiva/sclera: Conjunctivae normal.     Pupils: Pupils are equal, round, and reactive to light.  Neck:     Vascular: No carotid bruit.  Cardiovascular:     Rate and Rhythm: Normal rate.     Pulses: Normal pulses.  Pulmonary:     Effort: Pulmonary effort is normal. No respiratory distress.  Abdominal:  General: Abdomen is flat. There is no distension.     Palpations: Abdomen is soft. There is no mass.     Tenderness: There is no abdominal tenderness. There is no right CVA tenderness, left CVA tenderness, guarding or rebound.     Hernia: No hernia is present.  Musculoskeletal:     Cervical back: No muscular tenderness.  Lymphadenopathy:     Cervical: No cervical adenopathy.  Skin:    General: Skin is warm and dry.     Capillary Refill: Capillary refill takes less than 2 seconds.     Coloration: Skin is not jaundiced or pale.     Findings: No bruising, erythema, lesion or rash.  Neurological:     General: No focal deficit present.     Mental Status: She is alert. Mental status is at baseline.  Psychiatric:        Mood and Affect: Mood normal.        Behavior: Behavior normal.        Thought Content: Thought content normal.        Judgment: Judgment normal.   Musculoskeletal:  Exam found Decreased  ROM, Tissue texture changes, Tenderness to palpation and Asymmetry of patient's  head, neck, thorax, ribs, lumbar, pelvis, sacrum and abdomen Osteopathic Structural Exam:   Head: OAESSR, hypertonic suboccipital muscles, Temporal internally rotated and anterior   Neck: C3ESRR, C4ESRL, SCM hypertonic on the R, trap hypertonic on the R  Thorax: T3-5SLRR, trap hypertonic on the R  Ribs: Ribs 5-7 locked up on the L  Lumbar: QL hypertonic on the R, L3-5SLRR  Pelvis: Anterior R innominate  Sacrum: R on R torsion  Abdomen: diaphragm hypertonic bilaterally R>L   Results for orders placed or performed in visit on 06/19/19  Microalbumin, Urine Waived  Result Value Ref Range   Microalb, Ur Waived 80 (H) 0 - 19 mg/L   Creatinine, Urine Waived 300 10 - 300 mg/dL   Microalb/Creat Ratio 30-300 (H) <30 mg/g      Assessment & Plan:   Problem List Items Addressed This Visit      Other   Low back pain - Primary    Other Visit Diagnoses    Sacral region somatic dysfunction       Somatic dysfunction of pelvis region       Thoracic segment dysfunction       Rib cage region somatic dysfunction       Lumbar region somatic dysfunction       Somatic dysfunction of abdominal region       Cervical somatic dysfunction       Head region somatic dysfunction         After verbal consent was obtained, patient was treated today with osteopathic manipulative medicine to the regions of the head, neck, thorax, ribs, lumbar, pelvis, sacrum and abdomen using the techniques of cranial, myofascial release, counterstrain, muscle energy, HVLA and soft tissue. Areas of compensation relating to her primary pain source also treated. Patient tolerated the procedure well with good objective and good subjective improvement in symptoms. She left the room in good condition. She was advised to stay well hydrated and that she may have some soreness following the procedure. If not improving or worsening, she will call and come in.  She will return for reevaluation  on a PRN basis.   Follow up plan: Return june 6 month appt,1-2 months OMM.

## 2019-09-10 ENCOUNTER — Other Ambulatory Visit: Payer: Self-pay | Admitting: Family Medicine

## 2019-09-10 NOTE — Telephone Encounter (Signed)
Requested medications are due for refill today?  Yes - Medication cannot be delegated.    Requested medications are on active medication list?  Yes   Last Refill:   03/05/2019  # 30 with one refill.    Future visit scheduled? Yes in two days.    Notes to Clinic:  Requesting a new RX for controlled substance - although it was noted in May that patient was not taking this med?

## 2019-09-11 NOTE — Telephone Encounter (Signed)
Routing to provider  

## 2019-09-12 ENCOUNTER — Encounter: Payer: Self-pay | Admitting: Family Medicine

## 2019-09-12 ENCOUNTER — Other Ambulatory Visit: Payer: Self-pay

## 2019-09-12 ENCOUNTER — Ambulatory Visit (INDEPENDENT_AMBULATORY_CARE_PROVIDER_SITE_OTHER): Payer: BC Managed Care – PPO | Admitting: Family Medicine

## 2019-09-12 VITALS — BP 122/82 | HR 81 | Temp 98.3°F | Ht 64.47 in | Wt 211.8 lb

## 2019-09-12 DIAGNOSIS — R7301 Impaired fasting glucose: Secondary | ICD-10-CM | POA: Diagnosis not present

## 2019-09-12 DIAGNOSIS — E782 Mixed hyperlipidemia: Secondary | ICD-10-CM

## 2019-09-12 DIAGNOSIS — E079 Disorder of thyroid, unspecified: Secondary | ICD-10-CM | POA: Diagnosis not present

## 2019-09-12 DIAGNOSIS — F331 Major depressive disorder, recurrent, moderate: Secondary | ICD-10-CM | POA: Diagnosis not present

## 2019-09-12 DIAGNOSIS — I10 Essential (primary) hypertension: Secondary | ICD-10-CM

## 2019-09-12 DIAGNOSIS — E559 Vitamin D deficiency, unspecified: Secondary | ICD-10-CM

## 2019-09-12 HISTORY — DX: Essential (primary) hypertension: I10

## 2019-09-12 LAB — BAYER DCA HB A1C WAIVED: HB A1C (BAYER DCA - WAIVED): 5.5 % (ref <7.0)

## 2019-09-12 MED ORDER — SPIRONOLACTONE 100 MG PO TABS: 100.0000 mg | ORAL_TABLET | Freq: Two times a day (BID) | ORAL | 1 refills | Status: DC

## 2019-09-12 MED ORDER — CITALOPRAM HYDROBROMIDE 40 MG PO TABS: 60.0000 mg | ORAL_TABLET | Freq: Every day | ORAL | 1 refills | Status: DC

## 2019-09-12 MED ORDER — BUPROPION HCL ER (XL) 300 MG PO TB24: 300.0000 mg | ORAL_TABLET | Freq: Every day | ORAL | 1 refills | Status: DC

## 2019-09-12 MED ORDER — CLONAZEPAM 0.5 MG PO TABS: ORAL_TABLET | ORAL | 0 refills | Status: DC

## 2019-09-12 NOTE — Assessment & Plan Note (Signed)
Has statin intolerance. Not on medicine. Continue to monitor. Call with any concerns. Labs drawn today.

## 2019-09-12 NOTE — Progress Notes (Signed)
BP 122/82 (BP Location: Left Arm, Patient Position: Sitting, Cuff Size: Normal)   Pulse 81   Temp 98.3 F (36.8 C) (Oral)   Ht 5' 4.47" (1.638 m)   Wt 211 lb 12.8 oz (96.1 kg)   LMP  (LMP Unknown)   SpO2 98%   BMI 35.83 kg/m    Subjective:    Patient ID: Tracie Sheppard, female    DOB: 1956-03-17, 64 y.o.   MRN: [id number]  HPI: Tracie Sheppard is a 64 y.o. female  Chief Complaint  Patient presents with  . Depression  . Anxiety  . Hyperlipidemia    6 month follow up    Impaired Fasting Glucose HbA1C:  Lab Results  Component Value Date   HGBA1C 5.6 03/05/2019   Duration of elevated blood sugar: chronic Polydipsia: no Polyuria: no Weight change: no Visual disturbance: no Glucose Monitoring: no    Accucheck frequency: Not Checking Diabetic Education: Completed Family history of diabetes: yes  HYPERTENSION / HYPERLIPIDEMIA Satisfied with current treatment? yes Duration of hypertension: chronic BP monitoring frequency: not checking BP medication side effects: no Past BP meds: spirionalactone Duration of hyperlipidemia: chronic Cholesterol medication side effects: yes Cholesterol supplements: none Past cholesterol medications: none Medication compliance: excellent compliance Aspirin: no Recent stressors: yes Recurrent headaches: no Visual changes: no Palpitations: no Dyspnea: no Chest pain: no Lower extremity edema: no Dizzy/lightheaded: no  DEPRESSION Mood status: uncontrolled Satisfied with current treatment?: yes Symptom severity: moderate  Duration of current treatment : chronic Side effects: no Medication compliance: excellent compliance Psychotherapy/counseling: no  Previous psychiatric medications: citalopram, wellbutrin, klonopin Depressed mood: yes Anxious mood: yes Anhedonia: no Significant weight loss or gain: no Insomnia: no  Fatigue: yes Feelings of worthlessness or guilt: no Impaired concentration/indecisiveness: no Suicidal  ideations: no Hopelessness: no Crying spells: no Depression screen [facility] 2/9 09/12/2019 06/18/2019 03/05/2019 11/29/2018 04/05/2018  Decreased Interest 2 1 0 1 2  Down, Depressed, Hopeless 1 1 0 1 1  PHQ - 2 Score 3 2 0 2 3  Altered sleeping 2 1 3 3 3   Tired, decreased energy 2 2 1 3 2   Change in appetite 2 3 2 3 3   Feeling bad or failure about yourself  1 1 1 1 1   Trouble concentrating 1 0 0 1 0  Moving slowly or fidgety/restless 0 0 0 0 0  Suicidal thoughts 0 0 0 0 0  PHQ-9 Score 11 9 7 13 12   Difficult doing work/chores Very difficult Somewhat difficult Not difficult at all Somewhat difficult Very difficult  Some recent data might be hidden   GAD 7 : Generalized Anxiety Score 09/12/2019 04/05/2018 02/27/2018 07/25/2017  Nervous, Anxious, on Edge 2 3 2 3   Control/stop worrying 1 1 1 2   Worry too much - different things 2 1 1 1   Trouble relaxing 1 1 0 0  Restless 0 0 0 0  Easily annoyed or irritable 1 1 0 2  Afraid - awful might happen 1 1 0 1  Total GAD 7 Score 8 8 4 9   Anxiety Difficulty Somewhat difficult Somewhat difficult Somewhat difficult Somewhat difficult   HYPOTHYROIDISM Thyroid control status:stable Satisfied with current treatment? yes Medication side effects: yes Recent dose adjustment:yes- meds stopped recently Fatigue: yes Cold intolerance: no Heat intolerance: no Weight gain: no Weight loss: no Constipation: no Diarrhea/loose stools: no Palpitations: no Lower extremity edema: no Anxiety/depressed mood: yes  Relevant past medical, surgical, family and social history reviewed and updated as indicated. Interim medical  history since our last visit reviewed. Allergies and medications reviewed and updated.  Review of Systems  Constitutional: Negative.   Respiratory: Negative.   Cardiovascular: Negative.   Gastrointestinal: Negative.   Musculoskeletal: Positive for arthralgias and myalgias. Negative for back pain, gait problem, joint swelling, neck pain and neck  stiffness.  Skin: Negative.   Neurological: Negative.   Psychiatric/Behavioral: Positive for dysphoric mood. Negative for agitation, behavioral problems, confusion, decreased concentration, hallucinations, self-injury, sleep disturbance and suicidal ideas. The patient is nervous/anxious. The patient is not hyperactive.     Per HPI unless specifically indicated above     Objective:    BP 122/82 (BP Location: Left Arm, Patient Position: Sitting, Cuff Size: Normal)   Pulse 81   Temp 98.3 F (36.8 C) (Oral)   Ht 5' 4.47" (1.638 m)   Wt 211 lb 12.8 oz (96.1 kg)   LMP  (LMP Unknown)   SpO2 98%   BMI 35.83 kg/m   Wt Readings from Last 3 Encounters:  09/12/19 211 lb 12.8 oz (96.1 kg)  08/12/19 209 lb 9.6 oz (95.1 kg)  07/23/19 207 lb 9.6 oz (94.2 kg)    Physical Exam Vitals and nursing note reviewed.  Constitutional:      General: She is not in acute distress.    Appearance: Normal appearance. She is not ill-appearing, toxic-appearing or diaphoretic.  HENT:     Head: Normocephalic and atraumatic.     Right Ear: External ear normal.     Left Ear: External ear normal.     Nose: Nose normal.     Mouth/Throat:     Mouth: Mucous membranes are moist.     Pharynx: Oropharynx is clear.  Eyes:     General: No scleral icterus.       Right eye: No discharge.        Left eye: No discharge.     Extraocular Movements: Extraocular movements intact.     Conjunctiva/sclera: Conjunctivae normal.     Pupils: Pupils are equal, round, and reactive to light.  Cardiovascular:     Rate and Rhythm: Normal rate and regular rhythm.     Pulses: Normal pulses.     Heart sounds: Normal heart sounds. No murmur heard.  No friction rub. No gallop.   Pulmonary:     Effort: Pulmonary effort is normal. No respiratory distress.     Breath sounds: Normal breath sounds. No stridor. No wheezing, rhonchi or rales.  Chest:     Chest wall: No tenderness.  Musculoskeletal:        General: Normal range of  motion.     Cervical back: Normal range of motion and neck supple.  Skin:    General: Skin is warm and dry.     Capillary Refill: Capillary refill takes less than 2 seconds.     Coloration: Skin is not jaundiced or pale.     Findings: No bruising, erythema, lesion or rash.  Neurological:     General: No focal deficit present.     Mental Status: She is alert and oriented to person, place, and time. Mental status is at baseline.  Psychiatric:        Mood and Affect: Mood normal.        Behavior: Behavior normal.        Thought Content: Thought content normal.        Judgment: Judgment normal.     Results for orders placed or performed in visit on 06/19/19  Microalbumin, Urine Waived  Result Value Ref Range   Microalb, Ur Waived 80 (H) 0 - 19 mg/L   Creatinine, Urine Waived 300 10 - 300 mg/dL   Microalb/Creat Ratio 30-300 (H) <30 mg/g      Assessment & Plan:   Problem List Items Addressed This Visit      Cardiovascular and Mediastinum   HTN (hypertension)    Under good control on current regimen. Continue current regimen. Continue to monitor. Call with any concerns. Refills given. Labs drawn today.        Relevant Medications   spironolactone (ALDACTONE) 100 MG tablet     Endocrine   Thyroid disease - Primary    Has been off meds. Rechecking labs today. Await results. Call with any concerns.       Relevant Orders   CBC with Differential/Platelet   Comprehensive metabolic panel   TSH   IFG (impaired fasting glucose)    Rechecking labs today. Await results. Treat as needed.       Relevant Orders   Bayer DCA Hb A1c Waived   CBC with Differential/Platelet   Comprehensive metabolic panel     Other   Hyperlipidemia    Has statin intolerance. Not on medicine. Continue to monitor. Call with any concerns. Labs drawn today.      Relevant Medications   spironolactone (ALDACTONE) 100 MG tablet   Other Relevant Orders   CBC with Differential/Platelet   Comprehensive  metabolic panel   Lipid Panel w/o Chol/HDL Ratio   Depression, major, recurrent, moderate (HCC)    Under good control on current regimen. Continue current regimen. Continue to monitor. Call with any concerns. Refills given today.        Relevant Medications   citalopram (CELEXA) 40 MG tablet   buPROPion (WELLBUTRIN XL) 300 MG 24 hr tablet   Other Relevant Orders   CBC with Differential/Platelet   Comprehensive metabolic panel    Other Visit Diagnoses    Vitamin D deficiency       Rechecking labs today. Await results. Treat as needed.    Relevant Orders   VITAMIN D 25 Hydroxy (Vit-D Deficiency, Fractures)       Follow up plan: Return in about 6 months (around 03/13/2020) for Physical.

## 2019-09-12 NOTE — Assessment & Plan Note (Signed)
Has been off meds. Rechecking labs today. Await results. Call with any concerns.

## 2019-09-12 NOTE — Assessment & Plan Note (Signed)
Under good control on current regimen. Continue current regimen. Continue to monitor. Call with any concerns. Refills given. Labs drawn today.   

## 2019-09-12 NOTE — Assessment & Plan Note (Signed)
Rechecking labs today. Await results. Treat as needed.  °

## 2019-09-12 NOTE — Assessment & Plan Note (Signed)
Under good control on current regimen. Continue current regimen. Continue to monitor. Call with any concerns. Refills given today.

## 2019-09-13 LAB — COMPREHENSIVE METABOLIC PANEL
ALT: 30 IU/L (ref 0–32)
AST: 25 IU/L (ref 0–40)
Albumin/Globulin Ratio: 1.8 (ref 1.2–2.2)
Albumin: 4.3 g/dL (ref 3.8–4.8)
Alkaline Phosphatase: 74 IU/L (ref 48–121)
BUN/Creatinine Ratio: 14 (ref 12–28)
BUN: 14 mg/dL (ref 8–27)
Bilirubin Total: 1.2 mg/dL (ref 0.0–1.2)
CO2: 23 mmol/L (ref 20–29)
Calcium: 9.3 mg/dL (ref 8.7–10.3)
Chloride: 103 mmol/L (ref 96–106)
Creatinine, Ser: 0.98 mg/dL (ref 0.57–1.00)
GFR calc Af Amer: 72 mL/min/{1.73_m2} (ref >59)
GFR calc non Af Amer: 62 mL/min/{1.73_m2} (ref >59)
Globulin, Total: 2.4 g/dL (ref 1.5–4.5)
Glucose: 107 mg/dL — ABNORMAL HIGH (ref 65–99)
Potassium: 3.8 mmol/L (ref 3.5–5.2)
Sodium: 141 mmol/L (ref 134–144)
Total Protein: 6.7 g/dL (ref 6.0–8.5)

## 2019-09-13 LAB — CBC WITH DIFFERENTIAL/PLATELET
Basophils Absolute: 0.1 10*3/uL (ref 0.0–0.2)
Basos: 1 %
EOS (ABSOLUTE): 0.1 10*3/uL (ref 0.0–0.4)
Eos: 2 %
Hematocrit: 40.1 % (ref 34.0–46.6)
Hemoglobin: 13 g/dL (ref 11.1–15.9)
Immature Grans (Abs): 0 10*3/uL (ref 0.0–0.1)
Immature Granulocytes: 0 %
Lymphocytes Absolute: 3.1 10*3/uL (ref 0.7–3.1)
Lymphs: 40 %
MCH: 28.8 pg (ref 26.6–33.0)
MCHC: 32.4 g/dL (ref 31.5–35.7)
MCV: 89 fL (ref 79–97)
Monocytes Absolute: 0.7 10*3/uL (ref 0.1–0.9)
Monocytes: 9 %
Neutrophils Absolute: 3.7 10*3/uL (ref 1.4–7.0)
Neutrophils: 48 %
Platelets: 232 10*3/uL (ref 150–450)
RBC: 4.52 x10E6/uL (ref 3.77–5.28)
RDW: 12.3 % (ref 11.7–15.4)
WBC: 7.7 10*3/uL (ref 3.4–10.8)

## 2019-09-13 LAB — LIPID PANEL W/O CHOL/HDL RATIO
Cholesterol, Total: 228 mg/dL — ABNORMAL HIGH (ref 100–199)
HDL: 47 mg/dL (ref >39)
LDL Chol Calc (NIH): 165 mg/dL — ABNORMAL HIGH (ref 0–99)
Triglycerides: 92 mg/dL (ref 0–149)
VLDL Cholesterol Cal: 16 mg/dL (ref 5–40)

## 2019-09-13 LAB — VITAMIN D 25 HYDROXY (VIT D DEFICIENCY, FRACTURES): Vit D, 25-Hydroxy: 20.7 ng/mL — ABNORMAL LOW (ref 30.0–100.0)

## 2019-09-13 LAB — TSH: TSH: 1.45 u[IU]/mL (ref 0.450–4.500)

## 2019-12-11 ENCOUNTER — Encounter: Payer: Self-pay | Admitting: Family Medicine

## 2020-01-02 ENCOUNTER — Other Ambulatory Visit: Payer: Self-pay

## 2020-01-02 ENCOUNTER — Encounter: Payer: Self-pay | Admitting: Family Medicine

## 2020-01-02 ENCOUNTER — Ambulatory Visit (INDEPENDENT_AMBULATORY_CARE_PROVIDER_SITE_OTHER): Payer: BC Managed Care – PPO | Admitting: Family Medicine

## 2020-01-02 VITALS — BP 125/83 | HR 80 | Temp 98.2°F | Wt 212.0 lb

## 2020-01-02 DIAGNOSIS — R251 Tremor, unspecified: Secondary | ICD-10-CM

## 2020-01-02 NOTE — Progress Notes (Signed)
BP 125/83   Pulse 80   Temp 98.2 F (36.8 C) (Oral)   Wt 212 lb (96.2 kg)   LMP  (LMP Unknown)   SpO2 96%   BMI 35.86 kg/m    Subjective:    Patient ID: Tracie Sheppard, female    DOB: 11/08/1955, 64 y.o.   MRN: [id number]  HPI: Tracie Sheppard is a 64 y.o. female  Chief Complaint  Patient presents with  . Tremors    left hand. states that has had some tremors her whole life but lately has gotten worse   Has been having a tremor for years, but it has gotten a lot worse over the last several months. Worse when she's doing something, but happens even when she is resting. She doesn't know of anyone in the family shakes. She notes that she has had some shaking going back to when she was a little girl.   Relevant past medical, surgical, family and social history reviewed and updated as indicated. Interim medical history since our last visit reviewed. Allergies and medications reviewed and updated.  Review of Systems  Constitutional: Negative.   Respiratory: Negative.   Cardiovascular: Negative.   Musculoskeletal: Positive for arthralgias. Negative for back pain, gait problem, joint swelling, myalgias, neck pain and neck stiffness.  Skin: Negative.   Neurological: Positive for tremors. Negative for dizziness, seizures, syncope, facial asymmetry, speech difficulty, weakness, light-headedness, numbness and headaches.  Psychiatric/Behavioral: Negative for agitation, behavioral problems, confusion, decreased concentration, dysphoric mood, hallucinations, self-injury, sleep disturbance and suicidal ideas. The patient is nervous/anxious. The patient is not hyperactive.     Per HPI unless specifically indicated above     Objective:    BP 125/83   Pulse 80   Temp 98.2 F (36.8 C) (Oral)   Wt 212 lb (96.2 kg)   LMP  (LMP Unknown)   SpO2 96%   BMI 35.86 kg/m   Wt Readings from Last 3 Encounters:  01/02/20 212 lb (96.2 kg)  09/12/19 211 lb 12.8 oz (96.1 kg)  08/12/19 209 lb 9.6  oz (95.1 kg)    Physical Exam Vitals and nursing note reviewed.  Constitutional:      General: She is not in acute distress.    Appearance: Normal appearance. She is not ill-appearing, toxic-appearing or diaphoretic.  HENT:     Head: Normocephalic and atraumatic.     Right Ear: External ear normal.     Left Ear: External ear normal.     Nose: Nose normal.     Mouth/Throat:     Mouth: Mucous membranes are moist.     Pharynx: Oropharynx is clear.  Eyes:     General: No scleral icterus.       Right eye: No discharge.        Left eye: No discharge.     Extraocular Movements: Extraocular movements intact.     Conjunctiva/sclera: Conjunctivae normal.     Pupils: Pupils are equal, round, and reactive to light.  Cardiovascular:     Rate and Rhythm: Normal rate and regular rhythm.     Pulses: Normal pulses.     Heart sounds: Normal heart sounds. No murmur heard.  No friction rub. No gallop.   Pulmonary:     Effort: Pulmonary effort is normal. No respiratory distress.     Breath sounds: Normal breath sounds. No stridor. No wheezing, rhonchi or rales.  Chest:     Chest wall: No tenderness.  Musculoskeletal:  General: Normal range of motion.     Cervical back: Normal range of motion and neck supple.  Skin:    General: Skin is warm and dry.     Capillary Refill: Capillary refill takes less than 2 seconds.     Coloration: Skin is not jaundiced or pale.     Findings: No bruising, erythema, lesion or rash.  Neurological:     General: No focal deficit present.     Mental Status: She is alert and oriented to person, place, and time. Mental status is at baseline.     Comments: Fine tremor of bilateral hands L>R  Psychiatric:        Mood and Affect: Mood normal.        Behavior: Behavior normal.        Thought Content: Thought content normal.        Judgment: Judgment normal.     Results for orders placed or performed in visit on 09/12/19  Bayer DCA Hb A1c Waived  Result Value  Ref Range   HB A1C (BAYER DCA - WAIVED) 5.5 <7.0 %  CBC with Differential/Platelet  Result Value Ref Range   WBC 7.7 3.4 - 10.8 x10E3/uL   RBC 4.52 3.77 - 5.28 x10E6/uL   Hemoglobin 13.0 11.1 - 15.9 g/dL   Hematocrit 40.1 34.0 - 46.6 %   MCV 89 79 - 97 fL   MCH 28.8 26.6 - 33.0 pg   MCHC 32.4 31 - 35 g/dL   RDW 12.3 11.7 - 15.4 %   Platelets 232 150 - 450 x10E3/uL   Neutrophils 48 Not Estab. %   Lymphs 40 Not Estab. %   Monocytes 9 Not Estab. %   Eos 2 Not Estab. %   Basos 1 Not Estab. %   Neutrophils Absolute 3.7 1 - 7 x10E3/uL   Lymphocytes Absolute 3.1 0 - 3 x10E3/uL   Monocytes Absolute 0.7 0 - 0 x10E3/uL   EOS (ABSOLUTE) 0.1 0.0 - 0.4 x10E3/uL   Basophils Absolute 0.1 0 - 0 x10E3/uL   Immature Granulocytes 0 Not Estab. %   Immature Grans (Abs) 0.0 0.0 - 0.1 x10E3/uL  Comprehensive metabolic panel  Result Value Ref Range   Glucose 107 (H) 65 - 99 mg/dL   BUN 14 8 - 27 mg/dL   Creatinine, Ser 0.98 0.57 - 1.00 mg/dL   GFR calc non Af Amer 62 >59 mL/min/1.73   GFR calc Af Amer 72 >59 mL/min/1.73   BUN/Creatinine Ratio 14 12 - 28   Sodium 141 134 - 144 mmol/L   Potassium 3.8 3.5 - 5.2 mmol/L   Chloride 103 96 - 106 mmol/L   CO2 23 20 - 29 mmol/L   Calcium 9.3 8.7 - 10.3 mg/dL   Total Protein 6.7 6.0 - 8.5 g/dL   Albumin 4.3 3.8 - 4.8 g/dL   Globulin, Total 2.4 1.5 - 4.5 g/dL   Albumin/Globulin Ratio 1.8 1.2 - 2.2   Bilirubin Total 1.2 0.0 - 1.2 mg/dL   Alkaline Phosphatase 74 48 - 121 IU/L   AST 25 0 - 40 IU/L   ALT 30 0 - 32 IU/L  Lipid Panel w/o Chol/HDL Ratio  Result Value Ref Range   Cholesterol, Total 228 (H) 100 - 199 mg/dL   Triglycerides 92 0 - 149 mg/dL   HDL 47 >39 mg/dL   VLDL Cholesterol Cal 16 5 - 40 mg/dL   LDL Chol Calc (NIH) 165 (H) 0 - 99 mg/dL  TSH  Result  Value Ref Range   TSH 1.450 0.450 - 4.500 uIU/mL  VITAMIN D 25 Hydroxy (Vit-D Deficiency, Fractures)  Result Value Ref Range   Vit D, 25-Hydroxy 20.7 (L) 30.0 - 100.0 ng/mL        Assessment & Plan:   Problem List Items Addressed This Visit      Other   Tremor - Primary    Will get her into neurology for evaluation. Will check labs to look for other cause. Continue to monitor. Call with any concerns.       Relevant Orders   Bayer DCA Hb A1c Waived   CBC with Differential/Platelet   Comprehensive metabolic panel   TSH   VITAMIN D 25 Hydroxy (Vit-D Deficiency, Fractures)   B12 and Folate Panel   Ambulatory referral to Neurology       Follow up plan: Return As scheduled.

## 2020-01-02 NOTE — Assessment & Plan Note (Signed)
Will get her into neurology for evaluation. Will check labs to look for other cause. Continue to monitor. Call with any concerns.

## 2020-01-03 LAB — COMPREHENSIVE METABOLIC PANEL
ALT: 40 IU/L — ABNORMAL HIGH (ref 0–32)
AST: 31 IU/L (ref 0–40)
Albumin/Globulin Ratio: 1.7 (ref 1.2–2.2)
Albumin: 4.5 g/dL (ref 3.8–4.8)
Alkaline Phosphatase: 75 IU/L (ref 44–121)
BUN/Creatinine Ratio: 14 (ref 12–28)
BUN: 13 mg/dL (ref 8–27)
Bilirubin Total: 0.8 mg/dL (ref 0.0–1.2)
CO2: 26 mmol/L (ref 20–29)
Calcium: 9.6 mg/dL (ref 8.7–10.3)
Chloride: 103 mmol/L (ref 96–106)
Creatinine, Ser: 0.93 mg/dL (ref 0.57–1.00)
GFR calc Af Amer: 76 mL/min/{1.73_m2} (ref >59)
GFR calc non Af Amer: 66 mL/min/{1.73_m2} (ref >59)
Globulin, Total: 2.6 g/dL (ref 1.5–4.5)
Glucose: 93 mg/dL (ref 65–99)
Potassium: 4.1 mmol/L (ref 3.5–5.2)
Sodium: 140 mmol/L (ref 134–144)
Total Protein: 7.1 g/dL (ref 6.0–8.5)

## 2020-01-03 LAB — CBC WITH DIFFERENTIAL/PLATELET
Basophils Absolute: 0.1 10*3/uL (ref 0.0–0.2)
Basos: 1 %
EOS (ABSOLUTE): 0.1 10*3/uL (ref 0.0–0.4)
Eos: 2 %
Hematocrit: 40.2 % (ref 34.0–46.6)
Hemoglobin: 13.5 g/dL (ref 11.1–15.9)
Immature Grans (Abs): 0 10*3/uL (ref 0.0–0.1)
Immature Granulocytes: 0 %
Lymphocytes Absolute: 3.2 10*3/uL — ABNORMAL HIGH (ref 0.7–3.1)
Lymphs: 43 %
MCH: 29.3 pg (ref 26.6–33.0)
MCHC: 33.6 g/dL (ref 31.5–35.7)
MCV: 87 fL (ref 79–97)
Monocytes Absolute: 0.8 10*3/uL (ref 0.1–0.9)
Monocytes: 11 %
Neutrophils Absolute: 3.3 10*3/uL (ref 1.4–7.0)
Neutrophils: 43 %
Platelets: 236 10*3/uL (ref 150–450)
RBC: 4.6 x10E6/uL (ref 3.77–5.28)
RDW: 12.7 % (ref 11.7–15.4)
WBC: 7.5 10*3/uL (ref 3.4–10.8)

## 2020-01-03 LAB — VITAMIN D 25 HYDROXY (VIT D DEFICIENCY, FRACTURES): Vit D, 25-Hydroxy: 32 ng/mL (ref 30.0–100.0)

## 2020-01-03 LAB — BAYER DCA HB A1C WAIVED: HB A1C (BAYER DCA - WAIVED): 5.5 % (ref <7.0)

## 2020-01-03 LAB — B12 AND FOLATE PANEL
Folate: 6.1 ng/mL (ref >3.0)
Vitamin B-12: 1308 pg/mL — ABNORMAL HIGH (ref 232–1245)

## 2020-01-03 LAB — TSH: TSH: 1.77 u[IU]/mL (ref 0.450–4.500)

## 2020-02-21 ENCOUNTER — Other Ambulatory Visit: Payer: Self-pay

## 2020-02-21 ENCOUNTER — Encounter: Payer: Self-pay | Admitting: Family Medicine

## 2020-02-21 ENCOUNTER — Ambulatory Visit (INDEPENDENT_AMBULATORY_CARE_PROVIDER_SITE_OTHER): Payer: BC Managed Care – PPO | Admitting: Family Medicine

## 2020-02-21 VITALS — BP 136/76 | HR 81 | Temp 98.5°F | Ht 64.02 in | Wt 212.4 lb

## 2020-02-21 DIAGNOSIS — M9905 Segmental and somatic dysfunction of pelvic region: Secondary | ICD-10-CM

## 2020-02-21 DIAGNOSIS — M9900 Segmental and somatic dysfunction of head region: Secondary | ICD-10-CM | POA: Diagnosis not present

## 2020-02-21 DIAGNOSIS — M542 Cervicalgia: Secondary | ICD-10-CM | POA: Diagnosis not present

## 2020-02-21 DIAGNOSIS — M545 Low back pain, unspecified: Secondary | ICD-10-CM

## 2020-02-21 DIAGNOSIS — Z23 Encounter for immunization: Secondary | ICD-10-CM

## 2020-02-21 DIAGNOSIS — M9901 Segmental and somatic dysfunction of cervical region: Secondary | ICD-10-CM | POA: Diagnosis not present

## 2020-02-21 DIAGNOSIS — M25512 Pain in left shoulder: Secondary | ICD-10-CM

## 2020-02-21 DIAGNOSIS — M9906 Segmental and somatic dysfunction of lower extremity: Secondary | ICD-10-CM

## 2020-02-21 DIAGNOSIS — M9902 Segmental and somatic dysfunction of thoracic region: Secondary | ICD-10-CM

## 2020-02-21 DIAGNOSIS — M9903 Segmental and somatic dysfunction of lumbar region: Secondary | ICD-10-CM | POA: Diagnosis not present

## 2020-02-21 DIAGNOSIS — M9904 Segmental and somatic dysfunction of sacral region: Secondary | ICD-10-CM | POA: Diagnosis not present

## 2020-02-21 DIAGNOSIS — G8929 Other chronic pain: Secondary | ICD-10-CM

## 2020-02-21 DIAGNOSIS — M9909 Segmental and somatic dysfunction of abdomen and other regions: Secondary | ICD-10-CM

## 2020-02-21 DIAGNOSIS — M9907 Segmental and somatic dysfunction of upper extremity: Secondary | ICD-10-CM

## 2020-02-21 DIAGNOSIS — M9908 Segmental and somatic dysfunction of rib cage: Secondary | ICD-10-CM

## 2020-02-21 NOTE — Assessment & Plan Note (Signed)
In exacerbation. She does have somatic dysfunction that is contributing to her symptoms. Treated today as below. Call with any concerns.

## 2020-02-21 NOTE — Progress Notes (Signed)
BP 136/76   Pulse 81   Temp 98.5 F (36.9 C) (Oral)   Ht 5' 4.02" (1.626 m)   Wt 212 lb 6.4 oz (96.3 kg)   LMP  (LMP Unknown)   SpO2 99%   BMI 36.44 kg/m    Subjective:    Patient ID: Tracie Sheppard, female    DOB: 1955-05-31, 64 y.o.   MRN: [id number]  HPI: Tracie Sheppard is a 64 y.o. female  Chief Complaint  Patient presents with  . Shoulder Pain     Left shoulder,for past month or so, and has been radiating down arm   . Thigh pain    left thigh for past 2 to 3 weeks, come and go   Tracie Sheppard presents today for evaluation and possible treatment with OMT for neck, low back and leg pain. She notes that her pain has been going on for about 2-4 weeks. In her neck and L shoulder, it's been shooting down her L arm. Worse with lifting and stress. Better with rest and OMT. Pain is aching and shooting. Its been pretty constant over the past month or so. She notes that her low back pain and L thigh pain has been going on for a couple of weeks. It's been tight and aching. Better with rest and heat. Worse with certain activities. She notes that she has been under a lot of stress recently, but has has otherwise been doing well. No other concerns concerns or complaints at this time.   Relevant past medical, surgical, family and social history reviewed and updated as indicated. Interim medical history since our last visit reviewed. Allergies and medications reviewed and updated.  Review of Systems  Constitutional: Negative.   Respiratory: Negative.   Cardiovascular: Negative.   Gastrointestinal: Negative.   Musculoskeletal: Positive for arthralgias, back pain, myalgias, neck pain and neck stiffness. Negative for gait problem and joint swelling.  Skin: Negative.   Neurological: Negative.   Psychiatric/Behavioral: Negative.     Per HPI unless specifically indicated above     Objective:    BP 136/76   Pulse 81   Temp 98.5 F (36.9 C) (Oral)   Ht 5' 4.02" (1.626 m)   Wt 212 lb 6.4 oz  (96.3 kg)   LMP  (LMP Unknown)   SpO2 99%   BMI 36.44 kg/m   Wt Readings from Last 3 Encounters:  02/21/20 212 lb 6.4 oz (96.3 kg)  01/02/20 212 lb (96.2 kg)  09/12/19 211 lb 12.8 oz (96.1 kg)    Physical Exam Vitals and nursing note reviewed.  Constitutional:      General: She is not in acute distress.    Appearance: Normal appearance. She is not ill-appearing.  HENT:     Head: Normocephalic and atraumatic.     Right Ear: External ear normal.     Left Ear: External ear normal.     Nose: Nose normal.     Mouth/Throat:     Mouth: Mucous membranes are moist.     Pharynx: Oropharynx is clear.  Eyes:     Extraocular Movements: Extraocular movements intact.     Conjunctiva/sclera: Conjunctivae normal.     Pupils: Pupils are equal, round, and reactive to light.  Neck:     Vascular: No carotid bruit.  Cardiovascular:     Rate and Rhythm: Normal rate.     Pulses: Normal pulses.  Pulmonary:     Effort: Pulmonary effort is normal. No respiratory distress.  Abdominal:  General: Abdomen is flat. There is no distension.     Palpations: Abdomen is soft. There is no mass.     Tenderness: There is no abdominal tenderness. There is no right CVA tenderness, left CVA tenderness, guarding or rebound.     Hernia: No hernia is present.  Musculoskeletal:     Cervical back: No muscular tenderness.  Lymphadenopathy:     Cervical: No cervical adenopathy.  Skin:    General: Skin is warm and dry.     Capillary Refill: Capillary refill takes less than 2 seconds.     Coloration: Skin is not jaundiced or pale.     Findings: No bruising, erythema, lesion or rash.  Neurological:     General: No focal deficit present.     Mental Status: She is alert. Mental status is at baseline.  Psychiatric:        Mood and Affect: Mood normal.        Behavior: Behavior normal.        Thought Content: Thought content normal.        Judgment: Judgment normal.   Musculoskeletal:  Exam found Decreased ROM,  Tissue texture changes, Tenderness to palpation and Asymmetry of patient's  head, neck, thorax, ribs, lumbar, pelvis, sacrum, upper extremity, lower extremity and abdomen Osteopathic Structural Exam:   Head: hypertonic suboccipital muscles  Neck: SCM hypertonic on the L, C3ESRL  Thorax: T5-9SRRL  Ribs: Rib 7 locked up on the R  Lumbar: QL hypertonic on the L, L3ESRR, psoas hypertonic on the L  Pelvis:Anterior L innominate  Sacrum: L on L torsion  Upper Extremity: pec hypertonic on the L, clavicle restricted on the L, fascial strain from bicep into neck  Lower Extremity: IT hypertonic on the L with fascial strain into the L hip  Abdomen: diaphragm hypertonic bilaterally L>R   Results for orders placed or performed in visit on 01/02/20  Bayer DCA Hb A1c Waived  Result Value Ref Range   HB A1C (BAYER DCA - WAIVED) 5.5 <7.0 %  CBC with Differential/Platelet  Result Value Ref Range   WBC 7.5 3.4 - 10.8 x10E3/uL   RBC 4.60 3.77 - 5.28 x10E6/uL   Hemoglobin 13.5 11.1 - 15.9 g/dL   Hematocrit 40.2 34.0 - 46.6 %   MCV 87 79 - 97 fL   MCH 29.3 26.6 - 33.0 pg   MCHC 33.6 31 - 35 g/dL   RDW 12.7 11.7 - 15.4 %   Platelets 236 150 - 450 x10E3/uL   Neutrophils 43 Not Estab. %   Lymphs 43 Not Estab. %   Monocytes 11 Not Estab. %   Eos 2 Not Estab. %   Basos 1 Not Estab. %   Neutrophils Absolute 3.3 1.40 - 7.00 x10E3/uL   Lymphocytes Absolute 3.2 (H) 0 - 3 x10E3/uL   Monocytes Absolute 0.8 0 - 0 x10E3/uL   EOS (ABSOLUTE) 0.1 0.0 - 0.4 x10E3/uL   Basophils Absolute 0.1 0 - 0 x10E3/uL   Immature Granulocytes 0 Not Estab. %   Immature Grans (Abs) 0.0 0.0 - 0.1 x10E3/uL  Comprehensive metabolic panel  Result Value Ref Range   Glucose 93 65 - 99 mg/dL   BUN 13 8 - 27 mg/dL   Creatinine, Ser 0.93 0.57 - 1.00 mg/dL   GFR calc non Af Amer 66 >59 mL/min/1.73   GFR calc Af Amer 76 >59 mL/min/1.73   BUN/Creatinine Ratio 14 12 - 28   Sodium 140 134 - 144 mmol/L   Potassium  4.1 3.5 - 5.2 mmol/L     Chloride 103 96 - 106 mmol/L   CO2 26 20 - 29 mmol/L   Calcium 9.6 8.7 - 10.3 mg/dL   Total Protein 7.1 6.0 - 8.5 g/dL   Albumin 4.5 3.8 - 4.8 g/dL   Globulin, Total 2.6 1.5 - 4.5 g/dL   Albumin/Globulin Ratio 1.7 1.2 - 2.2   Bilirubin Total 0.8 0.0 - 1.2 mg/dL   Alkaline Phosphatase 75 44 - 121 IU/L   AST 31 0 - 40 IU/L   ALT 40 (H) 0 - 32 IU/L  TSH  Result Value Ref Range   TSH 1.770 0.450 - 4.500 uIU/mL  VITAMIN D 25 Hydroxy (Vit-D Deficiency, Fractures)  Result Value Ref Range   Vit D, 25-Hydroxy 32.0 30.0 - 100.0 ng/mL  B12 and Folate Panel  Result Value Ref Range   Vitamin B-12 1,308 (H) 232 - 1,245 pg/mL   Folate 6.1 >3.0 ng/mL      Assessment & Plan:   Problem List Items Addressed This Visit      Other   Low back pain    In exacerbation. She does have somatic dysfunction that is contributing to her symptoms. Treated today as below. Call with any concerns.        Other Visit Diagnoses    Neck pain    -  Primary   In exacerbation. She does have somatic dysfunction that is contributing to her symptoms. Treated today as below. If not improving- will get xray. Call w concern   Relevant Orders   DG Cervical Spine Complete   DG Shoulder Left   Chronic left shoulder pain       In exacerbation. She does have somatic dysfunction that is contributing to her symptoms. Treated today as below. If not improving- will get xray. Call w concern   Relevant Orders   DG Cervical Spine Complete   DG Shoulder Left   Sacral region somatic dysfunction       Somatic dysfunction of pelvis region       Thoracic segment dysfunction       Rib cage region somatic dysfunction       Lumbar region somatic dysfunction       Somatic dysfunction of abdominal region       Cervical somatic dysfunction       Head region somatic dysfunction       Somatic dysfunction of lower extremity       Somatic dysfunction of upper extremities       Need for influenza vaccination       Flu shot given  today.    Relevant Orders   Flu Vaccine QUAD 6+ mos PF IM (Fluarix Quad PF) (Completed)    After verbal consent was obtained, patient was treated today with osteopathic manipulative medicine to the regions of the head, neck, thorax, ribs, lumbar, pelvis, sacrum, abdomen, upper extremity and lower extremity using the techniques of cranial, myofascial release, counterstrain, muscle energy, HVLA and soft tissue. Areas of compensation relating to her primary pain source also treated. Patient tolerated the procedure well with good objective and good subjective improvement in symptoms. She left the room in good condition. She was advised to stay well hydrated and that she may have some soreness following the procedure. If not improving or worsening, she will call and come in. She will return for reevaluation   in 2-3 weeks.    Follow up plan: Return in about 2 weeks (around  03/06/2020).      

## 2020-02-25 ENCOUNTER — Other Ambulatory Visit: Payer: Self-pay | Admitting: Neurology

## 2020-02-25 DIAGNOSIS — R251 Tremor, unspecified: Secondary | ICD-10-CM

## 2020-03-11 ENCOUNTER — Other Ambulatory Visit: Payer: Self-pay

## 2020-03-11 ENCOUNTER — Ambulatory Visit (INDEPENDENT_AMBULATORY_CARE_PROVIDER_SITE_OTHER): Payer: BC Managed Care – PPO | Admitting: Family Medicine

## 2020-03-11 ENCOUNTER — Encounter: Payer: Self-pay | Admitting: Family Medicine

## 2020-03-11 VITALS — BP 150/80 | HR 88 | Temp 98.7°F | Wt 211.0 lb

## 2020-03-11 DIAGNOSIS — M9905 Segmental and somatic dysfunction of pelvic region: Secondary | ICD-10-CM

## 2020-03-11 DIAGNOSIS — M9909 Segmental and somatic dysfunction of abdomen and other regions: Secondary | ICD-10-CM

## 2020-03-11 DIAGNOSIS — M545 Low back pain, unspecified: Secondary | ICD-10-CM

## 2020-03-11 DIAGNOSIS — M9902 Segmental and somatic dysfunction of thoracic region: Secondary | ICD-10-CM | POA: Diagnosis not present

## 2020-03-11 DIAGNOSIS — M9903 Segmental and somatic dysfunction of lumbar region: Secondary | ICD-10-CM

## 2020-03-11 DIAGNOSIS — M9906 Segmental and somatic dysfunction of lower extremity: Secondary | ICD-10-CM

## 2020-03-11 DIAGNOSIS — M9904 Segmental and somatic dysfunction of sacral region: Secondary | ICD-10-CM

## 2020-03-11 DIAGNOSIS — M9908 Segmental and somatic dysfunction of rib cage: Secondary | ICD-10-CM

## 2020-03-11 NOTE — Progress Notes (Signed)
BP (!) 150/80   Pulse 88   Temp 98.7 F (37.1 C)   Wt 211 lb (95.7 kg)   LMP  (LMP Unknown)   SpO2 98%   BMI 36.20 kg/m    Subjective:    Patient ID: Tracie Sheppard, female    DOB: [birth year], 64 y.o.   MRN: [id number]  HPI: Tracie Sheppard is a 64 y.o. female  Chief Complaint  Patient presents with  . Back Pain   She is doing OK. Has a lot of tightness in her shoulders. Her aunt is not doing well. She has been under a lot stress with that. She notes that her low back has also been aching. Her pain is in her low back. It is aching and sore in nature. It does not radiate. It has been going on for a long time, but has been acting up over the past few days. She did well following her last appointment until about a week ago when it started acting up again.  Relevant past medical, surgical, family and social history reviewed and updated as indicated. Interim medical history since our last visit reviewed. Allergies and medications reviewed and updated.  Review of Systems  Constitutional: Negative.   Respiratory: Negative.   Cardiovascular: Negative.   Gastrointestinal: Negative.   Musculoskeletal: Positive for arthralgias, back pain and myalgias. Negative for gait problem, joint swelling, neck pain and neck stiffness.  Skin: Negative.   Neurological: Negative.   Psychiatric/Behavioral: Negative.     Per HPI unless specifically indicated above     Objective:    BP (!) 150/80   Pulse 88   Temp 98.7 F (37.1 C)   Wt 211 lb (95.7 kg)   LMP  (LMP Unknown)   SpO2 98%   BMI 36.20 kg/m   Wt Readings from Last 3 Encounters:  03/11/20 211 lb (95.7 kg)  02/21/20 212 lb 6.4 oz (96.3 kg)  01/02/20 212 lb (96.2 kg)    Physical Exam Vitals and nursing note reviewed.  Constitutional:      General: She is not in acute distress.    Appearance: Normal appearance. She is not ill-appearing.  HENT:     Head: Normocephalic and atraumatic.     Right Ear: External ear normal.      Left Ear: External ear normal.     Nose: Nose normal.     Mouth/Throat:     Mouth: Mucous membranes are moist.     Pharynx: Oropharynx is clear.  Eyes:     Extraocular Movements: Extraocular movements intact.     Conjunctiva/sclera: Conjunctivae normal.     Pupils: Pupils are equal, round, and reactive to light.  Neck:     Vascular: No carotid bruit.  Cardiovascular:     Rate and Rhythm: Normal rate.     Pulses: Normal pulses.  Pulmonary:     Effort: Pulmonary effort is normal. No respiratory distress.  Abdominal:     General: Abdomen is flat. There is no distension.     Palpations: Abdomen is soft. There is no mass.     Tenderness: There is no abdominal tenderness. There is no right CVA tenderness, left CVA tenderness, guarding or rebound.     Hernia: No hernia is present.  Musculoskeletal:     Cervical back: No muscular tenderness.  Lymphadenopathy:     Cervical: No cervical adenopathy.  Skin:    General: Skin is warm and dry.     Capillary Refill: Capillary refill takes  less than 2 seconds.     Coloration: Skin is not jaundiced or pale.     Findings: No bruising, erythema, lesion or rash.  Neurological:     General: No focal deficit present.     Mental Status: She is alert. Mental status is at baseline.  Psychiatric:        Mood and Affect: Mood normal.        Behavior: Behavior normal.        Thought Content: Thought content normal.        Judgment: Judgment normal.   Musculoskeletal:  Exam found Decreased ROM, Tissue texture changes, Tenderness to palpation and Asymmetry of patient's  thorax, ribs, lumbar, pelvis, sacrum, lower extremity and abdomen Osteopathic Structural Exam:   Thorax: T3-5SLRR  Ribs: Ribs 6-8 locked up on the L  Lumbar: QL hypertonic on the L, L3-5SRRL, psoas hypertonic on the L  Pelvis: Anterior L innominate  Sacrum: R on R torsion  Lower Extremity: IT band hypertonic on the L  Abdomen: diaphragm hypertonic L>R   Results for orders placed or  performed in visit on 01/02/20  Bayer DCA Hb A1c Waived  Result Value Ref Range   HB A1C (BAYER DCA - WAIVED) 5.5 <7.0 %  CBC with Differential/Platelet  Result Value Ref Range   WBC 7.5 3.4 - 10.8 x10E3/uL   RBC 4.60 3.77 - 5.28 x10E6/uL   Hemoglobin 13.5 11.1 - 15.9 g/dL   Hematocrit 40.2 34.0 - 46.6 %   MCV 87 79 - 97 fL   MCH 29.3 26.6 - 33.0 pg   MCHC 33.6 31.5 - 35.7 g/dL   RDW 12.7 11.7 - 15.4 %   Platelets 236 150 - 450 x10E3/uL   Neutrophils 43 Not Estab. %   Lymphs 43 Not Estab. %   Monocytes 11 Not Estab. %   Eos 2 Not Estab. %   Basos 1 Not Estab. %   Neutrophils Absolute 3.3 1.4 - 7.0 x10E3/uL   Lymphocytes Absolute 3.2 (H) 0.7 - 3.1 x10E3/uL   Monocytes Absolute 0.8 0.1 - 0.9 x10E3/uL   EOS (ABSOLUTE) 0.1 0.0 - 0.4 x10E3/uL   Basophils Absolute 0.1 0.0 - 0.2 x10E3/uL   Immature Granulocytes 0 Not Estab. %   Immature Grans (Abs) 0.0 0.0 - 0.1 x10E3/uL  Comprehensive metabolic panel  Result Value Ref Range   Glucose 93 65 - 99 mg/dL   BUN 13 8 - 27 mg/dL   Creatinine, Ser 0.93 0.57 - 1.00 mg/dL   GFR calc non Af Amer 66 >59 mL/min/1.73   GFR calc Af Amer 76 >59 mL/min/1.73   BUN/Creatinine Ratio 14 12 - 28   Sodium 140 134 - 144 mmol/L   Potassium 4.1 3.5 - 5.2 mmol/L   Chloride 103 96 - 106 mmol/L   CO2 26 20 - 29 mmol/L   Calcium 9.6 8.7 - 10.3 mg/dL   Total Protein 7.1 6.0 - 8.5 g/dL   Albumin 4.5 3.8 - 4.8 g/dL   Globulin, Total 2.6 1.5 - 4.5 g/dL   Albumin/Globulin Ratio 1.7 1.2 - 2.2   Bilirubin Total 0.8 0.0 - 1.2 mg/dL   Alkaline Phosphatase 75 44 - 121 IU/L   AST 31 0 - 40 IU/L   ALT 40 (H) 0 - 32 IU/L  TSH  Result Value Ref Range   TSH 1.770 0.450 - 4.500 uIU/mL  VITAMIN D 25 Hydroxy (Vit-D Deficiency, Fractures)  Result Value Ref Range   Vit D, 25-Hydroxy 32.0 30.0 -  100.0 ng/mL  B12 and Folate Panel  Result Value Ref Range   Vitamin B-12 1,308 (H) 232 - 1,245 pg/mL   Folate 6.1 >3.0 ng/mL      Assessment & Plan:   Problem List  Items Addressed This Visit      Other   Low back pain - Primary    In mild exacerbation, likely due to IT hypertonicity. She does have somatic dysfunction contrinbuting to symptoms. Treated today with good results as below.       Other Visit Diagnoses    Sacral region somatic dysfunction       Somatic dysfunction of pelvis region       Thoracic segment dysfunction       Rib cage region somatic dysfunction       Lumbar region somatic dysfunction       Somatic dysfunction of abdominal region       Somatic dysfunction of lower extremity         After verbal consent was obtained, patient was treated today with osteopathic manipulative medicine to the regions of the thorax, ribs, lumbar, pelvis, sacrum, abdomen and lower extremity using the techniques of myofascial release, counterstrain, muscle energy, HVLA and soft tissue. Areas of compensation relating to her primary pain source also treated. Patient tolerated the procedure well with good objective and good subjective improvement in symptoms. She left the room in good condition. She was advised to stay well hydrated and that she may have some soreness following the procedure. If not improving or worsening, she will call and come in. She will return for reevaluation  on a PRN basis.   Follow up plan: Return if symptoms worsen or fail to improve.

## 2020-03-17 ENCOUNTER — Encounter: Payer: BC Managed Care – PPO | Admitting: Family Medicine

## 2020-03-22 ENCOUNTER — Encounter: Payer: Self-pay | Admitting: Family Medicine

## 2020-03-22 NOTE — Assessment & Plan Note (Signed)
In mild exacerbation, likely due to IT hypertonicity. She does have somatic dysfunction contrinbuting to symptoms. Treated today with good results as below.

## 2020-03-25 ENCOUNTER — Other Ambulatory Visit: Payer: BC Managed Care – PPO

## 2020-03-25 ENCOUNTER — Encounter: Payer: BC Managed Care – PPO | Admitting: Family Medicine

## 2020-03-25 DIAGNOSIS — Z20822 Contact with and (suspected) exposure to covid-19: Secondary | ICD-10-CM

## 2020-03-26 LAB — SARS-COV-2, NAA 2 DAY TAT

## 2020-03-26 LAB — NOVEL CORONAVIRUS, NAA: SARS-CoV-2, NAA: NOT DETECTED

## 2020-04-09 ENCOUNTER — Ambulatory Visit (INDEPENDENT_AMBULATORY_CARE_PROVIDER_SITE_OTHER): Payer: BC Managed Care – PPO | Admitting: Family Medicine

## 2020-04-09 ENCOUNTER — Encounter: Payer: Self-pay | Admitting: Family Medicine

## 2020-04-09 ENCOUNTER — Other Ambulatory Visit: Payer: Self-pay

## 2020-04-09 VITALS — BP 138/80 | HR 73 | Temp 97.8°F | Ht 64.75 in | Wt 212.2 lb

## 2020-04-09 DIAGNOSIS — Z0000 Encounter for general adult medical examination without abnormal findings: Secondary | ICD-10-CM

## 2020-04-09 DIAGNOSIS — E079 Disorder of thyroid, unspecified: Secondary | ICD-10-CM

## 2020-04-09 DIAGNOSIS — R7301 Impaired fasting glucose: Secondary | ICD-10-CM

## 2020-04-09 DIAGNOSIS — E559 Vitamin D deficiency, unspecified: Secondary | ICD-10-CM

## 2020-04-09 DIAGNOSIS — Z1231 Encounter for screening mammogram for malignant neoplasm of breast: Secondary | ICD-10-CM

## 2020-04-09 DIAGNOSIS — I10 Essential (primary) hypertension: Secondary | ICD-10-CM

## 2020-04-09 DIAGNOSIS — E782 Mixed hyperlipidemia: Secondary | ICD-10-CM

## 2020-04-09 DIAGNOSIS — Z789 Other specified health status: Secondary | ICD-10-CM

## 2020-04-09 DIAGNOSIS — R8281 Pyuria: Secondary | ICD-10-CM

## 2020-04-09 DIAGNOSIS — F331 Major depressive disorder, recurrent, moderate: Secondary | ICD-10-CM

## 2020-04-09 DIAGNOSIS — R319 Hematuria, unspecified: Secondary | ICD-10-CM

## 2020-04-09 LAB — URINALYSIS, ROUTINE W REFLEX MICROSCOPIC
Bilirubin, UA: NEGATIVE
Glucose, UA: NEGATIVE
Nitrite, UA: NEGATIVE
Protein,UA: NEGATIVE
Specific Gravity, UA: >1.03 — ABNORMAL HIGH (ref 1.005–1.030)
Urobilinogen, Ur: 0.2 mg/dL (ref 0.2–1.0)
pH, UA: 5.5 (ref 5.0–7.5)

## 2020-04-09 LAB — MICROALBUMIN, URINE WAIVED
Creatinine, Urine Waived: 300 mg/dL (ref 10–300)
Microalb, Ur Waived: 30 mg/L — ABNORMAL HIGH (ref 0–19)
Microalb/Creat Ratio: <30 mg/g (ref <30)

## 2020-04-09 LAB — MICROSCOPIC EXAMINATION: Bacteria, UA: NONE SEEN

## 2020-04-09 LAB — BAYER DCA HB A1C WAIVED: HB A1C (BAYER DCA - WAIVED): 5.8 % (ref <7.0)

## 2020-04-09 MED ORDER — CLONAZEPAM 0.5 MG PO TABS: ORAL_TABLET | ORAL | 0 refills | Status: DC

## 2020-04-09 MED ORDER — FLUTICASONE PROPIONATE 50 MCG/ACT NA SUSP: NASAL | 12 refills | Status: DC

## 2020-04-09 MED ORDER — ALBUTEROL SULFATE HFA 108 (90 BASE) MCG/ACT IN AERS: 2.0000 | INHALATION_SPRAY | Freq: Four times a day (QID) | RESPIRATORY_TRACT | 3 refills | Status: DC | PRN

## 2020-04-09 MED ORDER — CETIRIZINE HCL 10 MG PO TABS: 10.0000 mg | ORAL_TABLET | Freq: Every day | ORAL | 3 refills | Status: DC | PRN

## 2020-04-09 MED ORDER — CITALOPRAM HYDROBROMIDE 40 MG PO TABS: 60.0000 mg | ORAL_TABLET | Freq: Every day | ORAL | 1 refills | Status: DC

## 2020-04-09 MED ORDER — BUPROPION HCL ER (XL) 300 MG PO TB24: 300.0000 mg | ORAL_TABLET | Freq: Every day | ORAL | 1 refills | Status: DC

## 2020-04-09 MED ORDER — SPIRONOLACTONE 100 MG PO TABS: 100.0000 mg | ORAL_TABLET | Freq: Two times a day (BID) | ORAL | 1 refills | Status: DC

## 2020-04-09 NOTE — Assessment & Plan Note (Signed)
Rechecking labs today. Await results. Treat as needed.  °

## 2020-04-09 NOTE — Assessment & Plan Note (Signed)
Statin intolerant. Rechecking labs today. Await results. Treat as needed.  

## 2020-04-09 NOTE — Assessment & Plan Note (Signed)
Under good control on current regimen. Continue current regimen. Continue to monitor. Call with any concerns. Refills given. Labs drawn today.   

## 2020-04-09 NOTE — Patient Instructions (Addendum)
Call to schedule your mammogram [facility]  Address: [address], [place], [place] [address]  Phone: [phone]   Health Maintenance for Postmenopausal Women Menopause is a normal process in which your ability to get pregnant comes to an end. This process happens slowly over many months or years, usually between the ages of 48 and 55. Menopause is complete when you have missed your menstrual periods for 12 months. It is important to talk with your health care provider about some of the most common conditions that affect women after menopause (postmenopausal women). These include heart disease, cancer, and bone loss (osteoporosis). Adopting a healthy lifestyle and getting preventive care can help to promote your health and wellness. The actions you take can also lower your chances of developing some of these common conditions. What should I know about menopause? During menopause, you may get a number of symptoms, such as:  Hot flashes. These can be moderate or severe.  Night sweats.  Decrease in sex drive.  Mood swings.  Headaches.  Tiredness.  Irritability.  Memory problems.  Insomnia. Choosing to treat or not to treat these symptoms is a decision that you make with your health care provider. Do I need hormone replacement therapy?  Hormone replacement therapy is effective in treating symptoms that are caused by menopause, such as hot flashes and night sweats.  Hormone replacement carries certain risks, especially as you become older. If you are thinking about using estrogen or estrogen with progestin, discuss the benefits and risks with your health care provider. What is my risk for heart disease and stroke? The risk of heart disease, heart attack, and stroke increases as you age. One of the causes may be a change in the body's hormones during menopause. This can affect how your body uses dietary fats, triglycerides, and cholesterol.  Heart attack and stroke are medical emergencies. There are many things that you can do to help prevent heart disease and stroke. Watch your blood pressure  High blood pressure causes heart disease and increases the risk of stroke. This is more likely to develop in people who have high blood pressure readings, are of African descent, or are overweight.  Have your blood pressure checked: ? Every 3-5 years if you are 18-39 years of age. ? Every year if you are 40 years old or older. Eat a healthy diet   Eat a diet that includes plenty of vegetables, fruits, low-fat dairy products, and lean protein.  Do not eat a lot of foods that are high in solid fats, added sugars, or sodium. Get regular exercise Get regular exercise. This is one of the most important things you can do for your health. Most adults should:  Try to exercise for at least 150 minutes each week. The exercise should increase your heart rate and make you sweat (moderate-intensity exercise).  Try to do strengthening exercises at least twice each week. Do these in addition to the moderate-intensity exercise.  Spend less time sitting. Even light physical activity can be beneficial. Other tips  Work with your health care provider to achieve or maintain a healthy weight.  Do not use any products that contain nicotine or tobacco, such as cigarettes, e-cigarettes, and chewing tobacco. If you need help quitting, ask your health care provider.  Know your numbers. Ask your health care provider to check your cholesterol and your blood sugar (glucose). Continue to have your blood tested as directed by your health care provider. Do   I need screening for cancer? Depending on your health history and family history, you may need to have cancer screening at different stages of your life. This may include screening for:  Breast cancer.  Cervical cancer.  Lung cancer.  Colorectal cancer. What is my risk for osteoporosis? After menopause,  you may be at increased risk for osteoporosis. Osteoporosis is a condition in which bone destruction happens more quickly than new bone creation. To help prevent osteoporosis or the bone fractures that can happen because of osteoporosis, you may take the following actions:  If you are 19-50 years old, get at least 1,000 mg of calcium and at least 600 mg of vitamin D per day.  If you are older than age 50 but younger than age 70, get at least 1,200 mg of calcium and at least 600 mg of vitamin D per day.  If you are older than age 70, get at least 1,200 mg of calcium and at least 800 mg of vitamin D per day. Smoking and drinking excessive alcohol increase the risk of osteoporosis. Eat foods that are rich in calcium and vitamin D, and do weight-bearing exercises several times each week as directed by your health care provider. How does menopause affect my mental health? Depression may occur at any age, but it is more common as you become older. Common symptoms of depression include:  Low or sad mood.  Changes in sleep patterns.  Changes in appetite or eating patterns.  Feeling an overall lack of motivation or enjoyment of activities that you previously enjoyed.  Frequent crying spells. Talk with your health care provider if you think that you are experiencing depression. General instructions See your health care provider for regular wellness exams and vaccines. This may include:  Scheduling regular health, dental, and eye exams.  Getting and maintaining your vaccines. These include: ? Influenza vaccine. Get this vaccine each year before the flu season begins. ? Pneumonia vaccine. ? Shingles vaccine. ? Tetanus, diphtheria, and pertussis (Tdap) booster vaccine. Your health care provider may also recommend other immunizations. Tell your health care provider if you have ever been abused or do not feel safe at home. Summary  Menopause is a normal process in which your ability to get  pregnant comes to an end.  This condition causes hot flashes, night sweats, decreased interest in sex, mood swings, headaches, or lack of sleep.  Treatment for this condition may include hormone replacement therapy.  Take actions to keep yourself healthy, including exercising regularly, eating a healthy diet, watching your weight, and checking your blood pressure and blood sugar levels.  Get screened for cancer and depression. Make sure that you are up to date with all your vaccines. This information is not intended to replace advice given to you by your health care provider. Make sure you discuss any questions you have with your health care provider. Document Revised: 03/14/2018 Document Reviewed: 03/14/2018 Elsevier Patient Education  2020 Elsevier Inc.  

## 2020-04-09 NOTE — Assessment & Plan Note (Signed)
Rechecking labs today. Await results.  

## 2020-04-09 NOTE — Assessment & Plan Note (Signed)
Doing well with A1c of 5.8. Continue diet and exercise. Call with any concerns.

## 2020-04-09 NOTE — Assessment & Plan Note (Addendum)
Under good control on current regimen. Continue current regimen. Continue to monitor. Call with any concerns. Refills given. Refills of clonazepam given today taking it very occasionally.

## 2020-04-09 NOTE — Progress Notes (Signed)
BP 138/80   Pulse 73   Temp 97.8 F (36.6 C)   Ht 5' 4.75" (1.645 m)   Wt 212 lb 3.2 oz (96.3 kg)   LMP  (LMP Unknown)   SpO2 98%   BMI 35.59 kg/m    Subjective:    Patient ID: Tracie Sheppard, female    DOB: 26-Mar-1956, 65 y.o.   MRN: [id number]  HPI: Tracie Sheppard is a 65 y.o. female presenting on 04/09/2020 for comprehensive medical examination. Current medical complaints include:  Impaired Fasting Glucose HbA1C:  Lab Results  Component Value Date   HGBA1C 5.5 01/02/2020   Duration of elevated blood sugar: chronic Polydipsia: no Polyuria: no Weight change: no Visual disturbance: no Glucose Monitoring: no Diabetic Education: Not Completed Family history of diabetes: yes  HYPERTENSION / HYPERLIPIDEMIA Satisfied with current treatment? yes Duration of hypertension: chronic BP monitoring frequency: not checking BP medication side effects: no Past BP meds: spironalactone Duration of hyperlipidemia: chronic Cholesterol medication side effects: yes Cholesterol supplements: none Past cholesterol medications: none Medication compliance: good compliance Aspirin: no Recent stressors: yes Recurrent headaches: no Visual changes: no Palpitations: no Dyspnea: no Chest pain: no Lower extremity edema: no Dizzy/lightheaded: no  DEPRESSION- her aunt died at the beginning of 24-Mar-2023, has been having some family issues with being the executor of her estate Mood status: stable Satisfied with current treatment?: yes Symptom severity: moderate  Duration of current treatment : chronic Side effects: no Medication compliance: excellent compliance Psychotherapy/counseling: no  Previous psychiatric medications: celexa, wellbutrin Depressed mood: yes Anxious mood: yes Anhedonia: no Significant weight loss or gain: no Insomnia: no  Fatigue: yes Feelings of worthlessness or guilt: no Impaired concentration/indecisiveness: no Suicidal ideations: no Hopelessness: no Crying  spells: yes Depression screen [facility] 2/9 04/09/2020 02/21/2020 01/02/2020 09/12/2019 06/18/2019  Decreased Interest 1 0 1 2 1   Down, Depressed, Hopeless 0 1 1 1 1   PHQ - 2 Score 1 1 2 3 2   Altered sleeping 3 3 2 2 1   Tired, decreased energy 2 1 2 2 2   Change in appetite 1 3 2 2 3   Feeling bad or failure about yourself  1 0 0 1 1  Trouble concentrating 0 0 1 1 0  Moving slowly or fidgety/restless 0 0 0 0 0  Suicidal thoughts 0 0 0 0 0  PHQ-9 Score 8 8 9 11 9   Difficult doing work/chores Somewhat difficult Somewhat difficult Somewhat difficult Very difficult Somewhat difficult  Some recent data might be hidden   She currently lives with: husband, daughter and grandson  Menopausal Symptoms: no  Depression Screen done today and results listed below:  Depression screen [facility] 2/9 04/09/2020 02/21/2020 01/02/2020 09/12/2019 06/18/2019  Decreased Interest 1 0 1 2 1   Down, Depressed, Hopeless 0 1 1 1 1   PHQ - 2 Score 1 1 2 3 2   Altered sleeping 3 3 2 2 1   Tired, decreased energy 2 1 2 2 2   Change in appetite 1 3 2 2 3   Feeling bad or failure about yourself  1 0 0 1 1  Trouble concentrating 0 0 1 1 0  Moving slowly or fidgety/restless 0 0 0 0 0  Suicidal thoughts 0 0 0 0 0  PHQ-9 Score 8 8 9 11 9   Difficult doing work/chores Somewhat difficult Somewhat difficult Somewhat difficult Very difficult Somewhat difficult  Some recent data might be hidden    Past Medical History:  Past Medical History:  Diagnosis Date  .  Allergy    Seasonal  . Anxiety   . Bilateral carpal tunnel syndrome 05/30/2017  . Chronic back pain   . Depression, major, recurrent, moderate ([place])   . Diabetes mellitus without complication ([place])    Prediabetic  . High serum testosterone   . HTN (hypertension) 09/12/2019  . Kidney stone   . Sleep apnea   . Thyroid disease    did not show up in lab work but per other symptoms previous doctor started her on this    Surgical History:  Past Surgical History:  Procedure Laterality  Date  . CHOLECYSTECTOMY     1997  . COLONOSCOPY WITH PROPOFOL N/A 11/16/2018   Procedure: COLONOSCOPY WITH PROPOFOL;  Surgeon: Lin Landsman, MD;  Location: [facility] ENDOSCOPY;  Service: Gastroenterology;  Laterality: N/A;  . ESOPHAGOGASTRODUODENOSCOPY (EGD) WITH PROPOFOL N/A 11/16/2018   Procedure: ESOPHAGOGASTRODUODENOSCOPY (EGD) WITH PROPOFOL;  Surgeon: Lin Landsman, MD;  Location: [facility] ENDOSCOPY;  Service: Gastroenterology;  Laterality: N/A;  . NASAL SEPTUM SURGERY    . OOPHORECTOMY    . TUBAL LIGATION     1989    Medications:  Current Outpatient Medications on File Prior to Visit  Medication Sig  . acetaminophen (TYLENOL) 500 MG tablet Tylenol Extra Strength 500 mg tablet  . Cyanocobalamin 1000 MCG TBCR Take by mouth.  Marland Kitchen ibuprofen (ADVIL) 200 MG tablet Take 200 mg by mouth as needed.   . propranolol ER (INDERAL LA) 60 MG 24 hr capsule Take by mouth.  Marland Kitchen VITAMIN D PO Take by mouth daily. 1000 mcg   No current facility-administered medications on file prior to visit.    Allergies:  Allergies  Allergen Reactions  . Aleve [Naproxen Sodium] Other (See Comments)    Chest pain  . Statins Other (See Comments)    myalgias    Social History:  Social History   Socioeconomic History  . Marital status: Married    Spouse name: Not on file  . Number of children: 1  . Years of education: 75  . Highest education level: Not on file  Occupational History  . Occupation: unemployed  Tobacco Use  . Smoking status: Never Smoker  . Smokeless tobacco: Never Used  Vaping Use  . Vaping Use: Never used  Substance and Sexual Activity  . Alcohol use: Yes    Comment: On occasion  . Drug use: No  . Sexual activity: Yes    Birth control/protection: Post-menopausal, None  Other Topics Concern  . Not on file  Social History Narrative   Lives with spouse   Caffeine use: 1-2 drinks per day   Right handed    Social Determinants of Health   Financial Resource Strain: Not on file   Food Insecurity: Not on file  Transportation Needs: Not on file  Physical Activity: Not on file  Stress: Not on file  Social Connections: Not on file  Intimate Partner Violence: Not on file   Social History   Tobacco Use  Smoking Status Never Smoker  Smokeless Tobacco Never Used   Social History   Substance and Sexual Activity  Alcohol Use Yes   Comment: On occasion    Family History:  Family History  Problem Relation Age of Onset  . Diabetes Mother   . Anemia Mother   . Hyperlipidemia Mother   . Arthritis Mother   . Hypertension Father   . Cancer Father   . Celiac disease Sister   . Diabetes Maternal Grandmother   . Stroke Maternal Grandmother   .  Heart attack Maternal Grandmother   . Heart disease Paternal Grandfather     Past medical history, surgical history, medications, allergies, family history and social history reviewed with patient today and changes made to appropriate areas of the chart.   Review of Systems  Constitutional: Positive for chills. Negative for diaphoresis, fever, malaise/fatigue and weight loss.  HENT: Negative.   Eyes: Negative.   Respiratory: Positive for wheezing. Negative for cough, hemoptysis, sputum production and shortness of breath.   Cardiovascular: Positive for palpitations (with anxiety). Negative for chest pain, orthopnea, claudication, leg swelling and PND.  Gastrointestinal: Negative.   Genitourinary: Negative.   Musculoskeletal: Negative.   Skin: Negative.   Neurological: Positive for tingling (in her hands). Negative for dizziness, tremors, sensory change, speech change, focal weakness, seizures, loss of consciousness, weakness and headaches.  Endo/Heme/Allergies: Negative.   Psychiatric/Behavioral: Positive for depression. Negative for hallucinations, memory loss, substance abuse and suicidal ideas. The patient is nervous/anxious. The patient does not have insomnia.     All other ROS negative except what is listed above  and in the HPI.      Objective:    BP 138/80   Pulse 73   Temp 97.8 F (36.6 C)   Ht 5' 4.75" (1.645 m)   Wt 212 lb 3.2 oz (96.3 kg)   LMP  (LMP Unknown)   SpO2 98%   BMI 35.59 kg/m   Wt Readings from Last 3 Encounters:  04/09/20 212 lb 3.2 oz (96.3 kg)  03/11/20 211 lb (95.7 kg)  02/21/20 212 lb 6.4 oz (96.3 kg)    Physical Exam Vitals and nursing note reviewed.  Constitutional:      General: She is not in acute distress.    Appearance: Normal appearance. She is not ill-appearing, toxic-appearing or diaphoretic.  HENT:     Head: Normocephalic and atraumatic.     Right Ear: Tympanic membrane, ear canal and external ear normal. There is no impacted cerumen.     Left Ear: Tympanic membrane, ear canal and external ear normal. There is no impacted cerumen.     Nose: Nose normal. No congestion or rhinorrhea.     Mouth/Throat:     Mouth: Mucous membranes are moist.     Pharynx: Oropharynx is clear. No oropharyngeal exudate or posterior oropharyngeal erythema.  Eyes:     General: No scleral icterus.       Right eye: No discharge.        Left eye: No discharge.     Extraocular Movements: Extraocular movements intact.     Conjunctiva/sclera: Conjunctivae normal.     Pupils: Pupils are equal, round, and reactive to light.  Neck:     Vascular: No carotid bruit.  Cardiovascular:     Rate and Rhythm: Normal rate and regular rhythm.     Pulses: Normal pulses.     Heart sounds: No murmur heard. No friction rub. No gallop.   Pulmonary:     Effort: Pulmonary effort is normal. No respiratory distress.     Breath sounds: Normal breath sounds. No stridor. No wheezing, rhonchi or rales.  Chest:     Chest wall: No tenderness.  Abdominal:     General: Abdomen is flat. Bowel sounds are normal. There is no distension.     Palpations: Abdomen is soft. There is no mass.     Tenderness: There is no abdominal tenderness. There is no right CVA tenderness, left CVA tenderness, guarding or  rebound.     Hernia: No  hernia is present.  Genitourinary:    Comments: Breast and pelvic exams deferred with shared decision making Musculoskeletal:        General: No swelling, tenderness, deformity or signs of injury.     Cervical back: Normal range of motion and neck supple. No rigidity. No muscular tenderness.     Right lower leg: No edema.     Left lower leg: No edema.  Lymphadenopathy:     Cervical: No cervical adenopathy.  Skin:    General: Skin is warm and dry.     Capillary Refill: Capillary refill takes less than 2 seconds.     Coloration: Skin is not jaundiced or pale.     Findings: No bruising, erythema, lesion or rash.  Neurological:     General: No focal deficit present.     Mental Status: She is alert and oriented to person, place, and time. Mental status is at baseline.     Cranial Nerves: No cranial nerve deficit.     Sensory: No sensory deficit.     Motor: No weakness.     Coordination: Coordination normal.     Gait: Gait normal.     Deep Tendon Reflexes: Reflexes normal.  Psychiatric:        Mood and Affect: Mood normal.        Behavior: Behavior normal.        Thought Content: Thought content normal.        Judgment: Judgment normal.     Results for orders placed or performed in visit on 03/25/20  Novel Coronavirus, NAA ([facility])   Specimen: Nasopharyngeal(NP) swabs in vial transport medium   Nasopharynge  Result Value Ref Range   SARS-CoV-2, NAA Not Detected Not Detected  SARS-COV-2, NAA 2 DAY TAT   Nasopharynge  Result Value Ref Range   SARS-CoV-2, NAA 2 DAY TAT Performed       Assessment & Plan:   Problem List Items Addressed This Visit      Cardiovascular and Mediastinum   HTN (hypertension)    Under good control on current regimen. Continue current regimen. Continue to monitor. Call with any concerns. Refills given. Labs drawn today.        Relevant Medications   spironolactone (ALDACTONE) 100 MG tablet   Other Relevant Orders   CBC  with Differential/Platelet   Comprehensive metabolic panel   Microalbumin, Urine Waived   Urinalysis, Routine w reflex microscopic     Endocrine   Thyroid disease    Rechecking labs today. Await results. Treat as needed.       Relevant Orders   CBC with Differential/Platelet   Comprehensive metabolic panel   IFG (impaired fasting glucose)    Doing well with A1c of 5.8. Continue diet and exercise. Call with any concerns.       Relevant Orders   Bayer DCA Hb A1c Waived   CBC with Differential/Platelet   Comprehensive metabolic panel   Urinalysis, Routine w reflex microscopic     Other   Hematuria    Rechecking labs today. Await results.       Relevant Orders   CBC with Differential/Platelet   Comprehensive metabolic panel   Hyperlipidemia    Statin intolerant. Rechecking labs today. Await results. Treat as needed.       Relevant Medications   spironolactone (ALDACTONE) 100 MG tablet   Other Relevant Orders   CBC with Differential/Platelet   Comprehensive metabolic panel   Lipid Panel w/o Chol/HDL Ratio   Depression, major,  recurrent, moderate ([facility])    Under good control on current regimen. Continue current regimen. Continue to monitor. Call with any concerns. Refills given. Refills of clonazepam given today taking it very occasionally.      Relevant Medications   citalopram (CELEXA) 40 MG tablet   buPROPion (WELLBUTRIN XL) 300 MG 24 hr tablet   Statin intolerance    Statin intolerant. Rechecking labs today. Await results. Treat as needed.        Other Visit Diagnoses    Routine general medical examination at a health care facility    -  Primary   Vaccines up to date. Screening labs checked today. Pap N/A. Mammogram ordered. Colonoscopy up to date. Continue diet and exercise. Call with any concerns.    Relevant Orders   Bayer DCA Hb A1c Waived   CBC with Differential/Platelet   Comprehensive metabolic panel   Lipid Panel w/o Chol/HDL Ratio   Microalbumin,  Urine Waived   TSH   Urinalysis, Routine w reflex microscopic   Vitamin D deficiency       Rechecking labs today. Await results.    Relevant Orders   VITAMIN D 25 Hydroxy (Vit-D Deficiency, Fractures)   Pyuria       Will check labs. Await results.    Relevant Orders   Urine Culture   Encounter for screening mammogram for malignant neoplasm of breast       Mammogram ordered today.   Relevant Orders   MM 3D SCREEN BREAST BILATERAL       Follow up plan: Return in about 6 months (around 10/07/2020).   LABORATORY TESTING:  - Pap smear: up to date  IMMUNIZATIONS:   - Tdap: Tetanus vaccination status reviewed: last tetanus booster within 10 years. - Influenza: Up to date - Pneumovax: Not applicable - Prevnar: Not applicable - COVID: Up to date  SCREENING: -Mammogram: Ordered today  - Colonoscopy: Up to date  - Bone Density: Not applicable   PATIENT COUNSELING:   Advised to take 1 mg of folate supplement per day if capable of pregnancy.   Sexuality: Discussed sexually transmitted diseases, partner selection, use of condoms, avoidance of unintended pregnancy  and contraceptive alternatives.   Advised to avoid cigarette smoking.  I discussed with the patient that most people either abstain from alcohol or drink within safe limits (<=14/week and <=4 drinks/occasion for males, <=7/weeks and <= 3 drinks/occasion for females) and that the risk for alcohol disorders and other health effects rises proportionally with the number of drinks per week and how often a drinker exceeds daily limits.  Discussed cessation/primary prevention of drug use and availability of treatment for abuse.   Diet: Encouraged to adjust caloric intake to maintain  or achieve ideal body weight, to reduce intake of dietary saturated fat and total fat, to limit sodium intake by avoiding high sodium foods and not adding table salt, and to maintain adequate dietary potassium and calcium preferably from fresh fruits,  vegetables, and low-fat dairy products.    stressed the importance of regular exercise  Injury prevention: Discussed safety belts, safety helmets, smoke detector, smoking near bedding or upholstery.   Dental health: Discussed importance of regular tooth brushing, flossing, and dental visits.    NEXT PREVENTATIVE PHYSICAL DUE IN 1 YEAR. Return in about 6 months (around 10/07/2020).

## 2020-04-10 LAB — CBC WITH DIFFERENTIAL/PLATELET
Basophils Absolute: 0 10*3/uL (ref 0.0–0.2)
Basos: 1 %
EOS (ABSOLUTE): 0.1 10*3/uL (ref 0.0–0.4)
Eos: 1 %
Hematocrit: 40.7 % (ref 34.0–46.6)
Hemoglobin: 13.6 g/dL (ref 11.1–15.9)
Immature Grans (Abs): 0 10*3/uL (ref 0.0–0.1)
Immature Granulocytes: 0 %
Lymphocytes Absolute: 3.3 10*3/uL — ABNORMAL HIGH (ref 0.7–3.1)
Lymphs: 41 %
MCH: 29.6 pg (ref 26.6–33.0)
MCHC: 33.4 g/dL (ref 31.5–35.7)
MCV: 89 fL (ref 79–97)
Monocytes Absolute: 0.7 10*3/uL (ref 0.1–0.9)
Monocytes: 9 %
Neutrophils Absolute: 3.9 10*3/uL (ref 1.4–7.0)
Neutrophils: 48 %
Platelets: 256 10*3/uL (ref 150–450)
RBC: 4.59 x10E6/uL (ref 3.77–5.28)
RDW: 12.3 % (ref 11.7–15.4)
WBC: 8.1 10*3/uL (ref 3.4–10.8)

## 2020-04-10 LAB — COMPREHENSIVE METABOLIC PANEL
ALT: 39 IU/L — ABNORMAL HIGH (ref 0–32)
AST: 25 IU/L (ref 0–40)
Albumin/Globulin Ratio: 1.6 (ref 1.2–2.2)
Albumin: 4.4 g/dL (ref 3.8–4.8)
Alkaline Phosphatase: 88 IU/L (ref 44–121)
BUN/Creatinine Ratio: 12 (ref 12–28)
BUN: 12 mg/dL (ref 8–27)
Bilirubin Total: 0.7 mg/dL (ref 0.0–1.2)
CO2: 22 mmol/L (ref 20–29)
Calcium: 9.5 mg/dL (ref 8.7–10.3)
Chloride: 105 mmol/L (ref 96–106)
Creatinine, Ser: 1.03 mg/dL — ABNORMAL HIGH (ref 0.57–1.00)
GFR calc Af Amer: 67 mL/min/{1.73_m2} (ref >59)
GFR calc non Af Amer: 58 mL/min/{1.73_m2} — ABNORMAL LOW (ref >59)
Globulin, Total: 2.8 g/dL (ref 1.5–4.5)
Glucose: 97 mg/dL (ref 65–99)
Potassium: 4.2 mmol/L (ref 3.5–5.2)
Sodium: 141 mmol/L (ref 134–144)
Total Protein: 7.2 g/dL (ref 6.0–8.5)

## 2020-04-10 LAB — LIPID PANEL W/O CHOL/HDL RATIO
Cholesterol, Total: 221 mg/dL — ABNORMAL HIGH (ref 100–199)
HDL: 50 mg/dL (ref >39)
LDL Chol Calc (NIH): 156 mg/dL — ABNORMAL HIGH (ref 0–99)
Triglycerides: 87 mg/dL (ref 0–149)
VLDL Cholesterol Cal: 15 mg/dL (ref 5–40)

## 2020-04-10 LAB — VITAMIN D 25 HYDROXY (VIT D DEFICIENCY, FRACTURES): Vit D, 25-Hydroxy: 28 ng/mL — ABNORMAL LOW (ref 30.0–100.0)

## 2020-04-10 LAB — TSH: TSH: 1.71 u[IU]/mL (ref 0.450–4.500)

## 2020-04-11 LAB — URINE CULTURE: Organism ID, Bacteria: NO GROWTH

## 2020-04-13 ENCOUNTER — Ambulatory Visit: Payer: Self-pay | Attending: Internal Medicine

## 2020-04-13 ENCOUNTER — Ambulatory Visit: Payer: Self-pay | Admitting: *Deleted

## 2020-04-13 DIAGNOSIS — Z23 Encounter for immunization: Secondary | ICD-10-CM

## 2020-04-13 NOTE — Telephone Encounter (Signed)
Summary: Clinical Advice   Patient was exposed to covid this past week and wants to know if she should not get her covid booster as scheduled this week. Please adivse      Attempted to call patient - left message to call office.

## 2020-04-13 NOTE — Telephone Encounter (Signed)
Patient received booster today. Attempted to call patient- left message to call office with questions or problems.

## 2020-04-13 NOTE — Progress Notes (Signed)
   Covid-19 Vaccination Clinic  Name:  Tracie Sheppard    MRN: [id number] DOB: 01-06-1956  04/13/2020  Tracie Sheppard was observed post Covid-19 immunization for 15 minutes without incident. She was provided with Vaccine Information Sheet and instruction to access the V-Safe system.   Tracie Sheppard was instructed to call 911 with any severe reactions post vaccine: Marland Kitchen Difficulty breathing  . Swelling of face and throat  . A fast heartbeat  . A bad rash all over body  . Dizziness and weakness   Immunizations Administered    Name Date Dose VIS Date Route   Pfizer COVID-19 Vaccine 04/13/2020  1:49 PM 0.3 mL 01/22/2020 Intramuscular   Manufacturer: [place]   Lot: [id number]   NDC: [address]-0

## 2020-04-14 ENCOUNTER — Ambulatory Visit: Payer: BC Managed Care – PPO | Admitting: Family Medicine

## 2020-04-14 ENCOUNTER — Telehealth: Payer: Self-pay

## 2020-04-14 NOTE — Telephone Encounter (Signed)
Pt called in to r/s omm appt. She had a question in regards to labwork and discomfort in her lower abdomin. She states that Dr Wynetta Emery said that he lab work for urine came back good but with the discomfort she is wondering if she should take azo on her own. Pt also states that Friday and Saturday she was nauseous and had her terrible headache. Please advise.

## 2020-04-16 NOTE — Telephone Encounter (Signed)
Called pt she states that she feels better will call if needed

## 2020-04-16 NOTE — Telephone Encounter (Signed)
I'm happy to repeat her urine. It was normal, but if she's feeling sick, we can. If she's still feeling sick, we can also see her virtually

## 2020-04-24 ENCOUNTER — Ambulatory Visit
Admission: RE | Admit: 2020-04-24 | Discharge: 2020-04-24 | Disposition: A | Discharge status: Home / Self Care | Payer: BC Managed Care – PPO | Source: Ambulatory Visit | Attending: Neurology | Admitting: Neurology

## 2020-04-24 ENCOUNTER — Other Ambulatory Visit: Payer: Self-pay

## 2020-04-24 DIAGNOSIS — R251 Tremor, unspecified: Secondary | ICD-10-CM | POA: Diagnosis present

## 2020-04-24 MED ORDER — GADOBUTROL 1 MMOL/ML IV SOLN
10.0000 mL | Freq: Once | INTRAVENOUS | Status: AC | PRN
  Administered 2020-04-24: 10 mL via INTRAVENOUS

## 2020-05-05 ENCOUNTER — Ambulatory Visit: Payer: BC Managed Care – PPO | Admitting: Family Medicine

## 2020-05-15 ENCOUNTER — Ambulatory Visit: Payer: BC Managed Care – PPO | Admitting: Family Medicine

## 2020-05-27 ENCOUNTER — Encounter: Payer: Self-pay | Admitting: Family Medicine

## 2020-05-28 ENCOUNTER — Encounter: Payer: Self-pay | Admitting: Family Medicine

## 2020-05-28 ENCOUNTER — Ambulatory Visit: Payer: BC Managed Care – PPO | Admitting: Family Medicine

## 2020-05-28 ENCOUNTER — Other Ambulatory Visit: Payer: Self-pay

## 2020-05-28 VITALS — BP 129/80 | HR 83 | Temp 98.6°F | Wt 212.2 lb

## 2020-05-28 DIAGNOSIS — R310 Gross hematuria: Secondary | ICD-10-CM

## 2020-05-28 LAB — URINALYSIS, ROUTINE W REFLEX MICROSCOPIC
Bilirubin, UA: NEGATIVE
Glucose, UA: NEGATIVE
Leukocytes,UA: NEGATIVE
Nitrite, UA: NEGATIVE
Specific Gravity, UA: 1.02 (ref 1.005–1.030)
Urobilinogen, Ur: 0.2 mg/dL (ref 0.2–1.0)
pH, UA: 7 (ref 5.0–7.5)

## 2020-05-28 LAB — MICROSCOPIC EXAMINATION
Crystal Type: NONE SEEN
Crystals: NONE SEEN
RBC, Urine: >30 /hpf — AB (ref 0–2)
Trichomonas, UA: NONE SEEN
Yeast, UA: NONE SEEN

## 2020-05-28 MED ORDER — KETOROLAC TROMETHAMINE 10 MG PO TABS: 10.0000 mg | ORAL_TABLET | Freq: Four times a day (QID) | ORAL | 0 refills | Status: DC | PRN

## 2020-05-28 MED ORDER — TAMSULOSIN HCL 0.4 MG PO CAPS: 0.4000 mg | ORAL_CAPSULE | Freq: Every day | ORAL | 0 refills | Status: DC

## 2020-05-28 NOTE — Assessment & Plan Note (Signed)
Concern for kidney stone. Will treat with flomax and toradol. If hasn't passed by Monday- will call and we'll obtain CT scan. Call with any concerns.

## 2020-05-28 NOTE — Progress Notes (Signed)
BP 129/80   Pulse 83   Temp 98.6 F (37 C)   Wt 212 lb 3.2 oz (96.3 kg)   LMP  (LMP Unknown)   SpO2 97%   BMI 35.59 kg/m    Subjective:    Patient ID: Tracie Sheppard, female    DOB: 07-03-1955, 65 y.o.   MRN: [id number]  HPI: Tracie Sheppard is a 65 y.o. female  Chief Complaint  Patient presents with  . Hematuria    Patient states she noticed blood In her urine. Pain in her lower abdomen and lower back.    URINARY SYMPTOMS Duration: 1 day Dysuria: yes Urinary frequency: no Urgency: yes Small volume voids: no Symptom severity: no Urinary incontinence: no Foul odor: no Hematuria: yes Abdominal pain: yes Back pain: yes Suprapubic pain/pressure: yes Flank pain: yes Fever:  no Vomiting: no Relief with cranberry juice: no Relief with pyridium: no Status: stable Previous urinary tract infection: no Recurrent urinary tract infection: no Sexual activity: monogomous History of sexually transmitted disease: no Vaginal discharge: no Treatments attempted: pyridium and increasing fluids   Relevant past medical, surgical, family and social history reviewed and updated as indicated. Interim medical history since our last visit reviewed. Allergies and medications reviewed and updated.  Review of Systems  Constitutional: Negative.   Respiratory: Negative.   Cardiovascular: Negative.   Gastrointestinal: Positive for abdominal pain. Negative for abdominal distention, anal bleeding, blood in stool, constipation, diarrhea, nausea, rectal pain and vomiting.  Genitourinary: Positive for hematuria. Negative for decreased urine volume, difficulty urinating, dyspareunia, dysuria, enuresis, flank pain, frequency, genital sores, menstrual problem, pelvic pain, urgency, vaginal bleeding, vaginal discharge and vaginal pain.  Musculoskeletal: Negative.   Psychiatric/Behavioral: Negative.     Per HPI unless specifically indicated above     Objective:    BP 129/80   Pulse 83   Temp  98.6 F (37 C)   Wt 212 lb 3.2 oz (96.3 kg)   LMP  (LMP Unknown)   SpO2 97%   BMI 35.59 kg/m   Wt Readings from Last 3 Encounters:  05/28/20 212 lb 3.2 oz (96.3 kg)  04/09/20 212 lb 3.2 oz (96.3 kg)  03/11/20 211 lb (95.7 kg)    Physical Exam Vitals and nursing note reviewed.  Constitutional:      General: She is not in acute distress.    Appearance: Normal appearance. She is not ill-appearing, toxic-appearing or diaphoretic.  HENT:     Head: Normocephalic and atraumatic.     Right Ear: External ear normal.     Left Ear: External ear normal.     Nose: Nose normal.     Mouth/Throat:     Mouth: Mucous membranes are moist.     Pharynx: Oropharynx is clear.  Eyes:     General: No scleral icterus.       Right eye: No discharge.        Left eye: No discharge.     Extraocular Movements: Extraocular movements intact.     Conjunctiva/sclera: Conjunctivae normal.     Pupils: Pupils are equal, round, and reactive to light.  Cardiovascular:     Rate and Rhythm: Normal rate and regular rhythm.     Pulses: Normal pulses.     Heart sounds: Normal heart sounds. No murmur heard. No friction rub. No gallop.   Pulmonary:     Effort: Pulmonary effort is normal. No respiratory distress.     Breath sounds: Normal breath sounds. No stridor. No wheezing, rhonchi  or rales.  Chest:     Chest wall: No tenderness.  Musculoskeletal:        General: Normal range of motion.     Cervical back: Normal range of motion and neck supple.  Skin:    General: Skin is warm and dry.     Capillary Refill: Capillary refill takes less than 2 seconds.     Coloration: Skin is not jaundiced or pale.     Findings: No bruising, erythema, lesion or rash.  Neurological:     General: No focal deficit present.     Mental Status: She is alert and oriented to person, place, and time. Mental status is at baseline.  Psychiatric:        Mood and Affect: Mood normal.        Behavior: Behavior normal.        Thought  Content: Thought content normal.        Judgment: Judgment normal.     Results for orders placed or performed in visit on 04/09/20  Urine Culture   Specimen: Urine   UR  Result Value Ref Range   Urine Culture, Routine Final report    Organism ID, Bacteria No growth   Microscopic Examination   BLD  Result Value Ref Range   WBC, UA 0-5 0 - 5 /hpf   RBC 0-2 0 - 2 /hpf   Epithelial Cells (non renal) 0-10 0 - 10 /hpf   Bacteria, UA None seen None seen/Few  Bayer DCA Hb A1c Waived  Result Value Ref Range   HB A1C (BAYER DCA - WAIVED) 5.8 <7.0 %  CBC with Differential/Platelet  Result Value Ref Range   WBC 8.1 3.4 - 10.8 x10E3/uL   RBC 4.59 3.77 - 5.28 x10E6/uL   Hemoglobin 13.6 11.1 - 15.9 g/dL   Hematocrit 40.7 34.0 - 46.6 %   MCV 89 79 - 97 fL   MCH 29.6 26.6 - 33.0 pg   MCHC 33.4 31.5 - 35.7 g/dL   RDW 12.3 11.7 - 15.4 %   Platelets 256 150 - 450 x10E3/uL   Neutrophils 48 Not Estab. %   Lymphs 41 Not Estab. %   Monocytes 9 Not Estab. %   Eos 1 Not Estab. %   Basos 1 Not Estab. %   Neutrophils Absolute 3.9 1.4 - 7.0 x10E3/uL   Lymphocytes Absolute 3.3 (H) 0.7 - 3.1 x10E3/uL   Monocytes Absolute 0.7 0.1 - 0.9 x10E3/uL   EOS (ABSOLUTE) 0.1 0.0 - 0.4 x10E3/uL   Basophils Absolute 0.0 0.0 - 0.2 x10E3/uL   Immature Granulocytes 0 Not Estab. %   Immature Grans (Abs) 0.0 0.0 - 0.1 x10E3/uL  Comprehensive metabolic panel  Result Value Ref Range   Glucose 97 65 - 99 mg/dL   BUN 12 8 - 27 mg/dL   Creatinine, Ser 1.03 (H) 0.57 - 1.00 mg/dL   GFR calc non Af Amer 58 (L) >59 mL/min/1.73   GFR calc Af Amer 67 >59 mL/min/1.73   BUN/Creatinine Ratio 12 12 - 28   Sodium 141 134 - 144 mmol/L   Potassium 4.2 3.5 - 5.2 mmol/L   Chloride 105 96 - 106 mmol/L   CO2 22 20 - 29 mmol/L   Calcium 9.5 8.7 - 10.3 mg/dL   Total Protein 7.2 6.0 - 8.5 g/dL   Albumin 4.4 3.8 - 4.8 g/dL   Globulin, Total 2.8 1.5 - 4.5 g/dL   Albumin/Globulin Ratio 1.6 1.2 - 2.2   Bilirubin Total 0.7  0.0 - 1.2  mg/dL   Alkaline Phosphatase 88 44 - 121 IU/L   AST 25 0 - 40 IU/L   ALT 39 (H) 0 - 32 IU/L  Lipid Panel w/o Chol/HDL Ratio  Result Value Ref Range   Cholesterol, Total 221 (H) 100 - 199 mg/dL   Triglycerides 87 0 - 149 mg/dL   HDL 50 >39 mg/dL   VLDL Cholesterol Cal 15 5 - 40 mg/dL   LDL Chol Calc (NIH) 156 (H) 0 - 99 mg/dL  Microalbumin, Urine Waived  Result Value Ref Range   Microalb, Ur Waived 30 (H) 0 - 19 mg/L   Creatinine, Urine Waived 300 10 - 300 mg/dL   Microalb/Creat Ratio <30 <30 mg/g  TSH  Result Value Ref Range   TSH 1.710 0.450 - 4.500 uIU/mL  Urinalysis, Routine w reflex microscopic  Result Value Ref Range   Specific Gravity, UA >1.030 (H) 1.005 - 1.030   pH, UA 5.5 5.0 - 7.5   Color, UA Yellow Yellow   Appearance Ur Clear Clear   Leukocytes,UA Trace (A) Negative   Protein,UA Negative Negative/Trace   Glucose, UA Negative Negative   Ketones, UA Trace (A) Negative   RBC, UA Trace (A) Negative   Bilirubin, UA Negative Negative   Urobilinogen, Ur 0.2 0.2 - 1.0 mg/dL   Nitrite, UA Negative Negative   Microscopic Examination See below:   VITAMIN D 25 Hydroxy (Vit-D Deficiency, Fractures)  Result Value Ref Range   Vit D, 25-Hydroxy 28.0 (L) 30.0 - 100.0 ng/mL      Assessment & Plan:   Problem List Items Addressed This Visit      Other   Hematuria - Primary    Concern for kidney stone. Will treat with flomax and toradol. If hasn't passed by Monday- will call and we'll obtain CT scan. Call with any concerns.      Relevant Orders   Urinalysis, Routine w reflex microscopic       Follow up plan: Return if symptoms worsen or fail to improve.

## 2020-05-28 NOTE — Patient Instructions (Signed)

## 2020-06-02 ENCOUNTER — Telehealth: Payer: Self-pay | Admitting: Family Medicine

## 2020-06-02 ENCOUNTER — Ambulatory Visit
Admission: RE | Admit: 2020-06-02 | Discharge: 2020-06-02 | Disposition: A | Discharge status: Home / Self Care | Payer: Self-pay | Source: Ambulatory Visit | Attending: Family Medicine | Admitting: Family Medicine

## 2020-06-02 ENCOUNTER — Encounter: Payer: Self-pay | Admitting: Family Medicine

## 2020-06-02 ENCOUNTER — Other Ambulatory Visit: Payer: Self-pay | Admitting: Family Medicine

## 2020-06-02 ENCOUNTER — Other Ambulatory Visit: Payer: Self-pay

## 2020-06-02 DIAGNOSIS — N200 Calculus of kidney: Secondary | ICD-10-CM

## 2020-06-02 DIAGNOSIS — N1330 Unspecified hydronephrosis: Secondary | ICD-10-CM

## 2020-06-02 DIAGNOSIS — R310 Gross hematuria: Secondary | ICD-10-CM | POA: Insufficient documentation

## 2020-06-02 MED ORDER — OXYCODONE-ACETAMINOPHEN 10-325 MG PO TABS: 1.0000 | ORAL_TABLET | Freq: Three times a day (TID) | ORAL | 0 refills | Status: AC | PRN

## 2020-06-02 NOTE — Telephone Encounter (Signed)
Patient notified

## 2020-06-02 NOTE — Telephone Encounter (Signed)
Pt seen dr Wynetta Emery on 05-28-2020 and pt is not better still having back pain and blood in urine. Pt starting throwing up 3 am this morning and as of right now no vomiting. Please advise

## 2020-06-02 NOTE — Addendum Note (Signed)
Addended by: Valerie Roys on: 06/02/2020 04:50 PM   Modules accepted: Orders

## 2020-06-02 NOTE — Telephone Encounter (Signed)
Please let her know that I've ordered a CT scan to be done today- someone should be calling her to tell her where she needs to go to get it done.

## 2020-06-02 NOTE — Telephone Encounter (Signed)
Urgent referral to urology placed today for R sided hydronephrosis and kidney stone

## 2020-06-03 ENCOUNTER — Encounter: Payer: Self-pay | Admitting: Urology

## 2020-06-03 ENCOUNTER — Other Ambulatory Visit: Payer: Self-pay

## 2020-06-03 ENCOUNTER — Other Ambulatory Visit: Payer: Self-pay | Admitting: Family Medicine

## 2020-06-03 ENCOUNTER — Ambulatory Visit (INDEPENDENT_AMBULATORY_CARE_PROVIDER_SITE_OTHER): Payer: BC Managed Care – PPO | Admitting: Urology

## 2020-06-03 ENCOUNTER — Ambulatory Visit
Admission: RE | Admit: 2020-06-03 | Discharge: 2020-06-03 | Disposition: A | Discharge status: Home / Self Care | Payer: Self-pay | Attending: Urology | Admitting: Urology

## 2020-06-03 ENCOUNTER — Ambulatory Visit
Admission: RE | Admit: 2020-06-03 | Discharge: 2020-06-03 | Disposition: A | Discharge status: Home / Self Care | Payer: Self-pay | Source: Ambulatory Visit | Attending: Urology | Admitting: Urology

## 2020-06-03 VITALS — BP 127/73 | HR 88 | Ht 64.0 in | Wt 212.0 lb

## 2020-06-03 DIAGNOSIS — N201 Calculus of ureter: Secondary | ICD-10-CM

## 2020-06-03 DIAGNOSIS — R109 Unspecified abdominal pain: Secondary | ICD-10-CM

## 2020-06-03 DIAGNOSIS — R10A0 Flank pain, unspecified side: Secondary | ICD-10-CM

## 2020-06-03 DIAGNOSIS — N200 Calculus of kidney: Secondary | ICD-10-CM

## 2020-06-03 DIAGNOSIS — N132 Hydronephrosis with renal and ureteral calculous obstruction: Secondary | ICD-10-CM

## 2020-06-03 DIAGNOSIS — N23 Unspecified renal colic: Secondary | ICD-10-CM

## 2020-06-03 MED ORDER — ONDANSETRON 4 MG PO TBDP: 4.0000 mg | ORAL_TABLET | Freq: Three times a day (TID) | ORAL | 0 refills | Status: DC | PRN

## 2020-06-03 NOTE — Progress Notes (Signed)
06/03/2020 1:12 PM   Tracie Sheppard October 09, 1955 [id number]  Referring provider: Valerie Roys, DO [place],  [place] [address]  Chief Complaint  Patient presents with  . Hematuria    HPI: Tracie Sheppard is a 66 y.o. female referred for renal colic.   05/27/2020 had an episode of gross hematuria and contacted her PCP  Was seen on 2/24 and UA had >30 RBC  Started on Toradol and tamsulosin  On 2/26 had onset of right flank pain radiating to right lower quadrant and associated with nausea/vomiting  Felt better the following day  CT performed yesterday showed a right proximal ureteral calculus measuring 3 x 4 mm  Was given Rx oxycodone and currently with minimal pain  Did take ketorolac today  Prior history of stones which she has been able to pass with last episode several years ago  Denies fever or chills   PMH: Past Medical History:  Diagnosis Date  . Allergy    Seasonal  . Anxiety   . Bilateral carpal tunnel syndrome 05/30/2017  . Chronic back pain   . Depression, major, recurrent, moderate ([place])   . Diabetes mellitus without complication ([place])    Prediabetic  . High serum testosterone   . HTN (hypertension) 09/12/2019  . Kidney stone   . Sleep apnea   . Thyroid disease    did not show up in lab work but per other symptoms previous doctor started her on this    Surgical History: Past Surgical History:  Procedure Laterality Date  . CHOLECYSTECTOMY     1997  . COLONOSCOPY WITH PROPOFOL N/A 11/16/2018   Procedure: COLONOSCOPY WITH PROPOFOL;  Surgeon: Lin Landsman, MD;  Location[facility] ENDOSCOPY;  Service: Gastroenterology;  Laterality: N/A;  . ESOPHAGOGASTRODUODENOSCOPY (EGD) WITH PROPOFOL N/A 11/16/2018   Procedure: ESOPHAGOGASTRODUODENOSCOPY (EGD) WITH PROPOFOL;  Surgeon: Lin Landsman, MD;  Location: [facility] ENDOSCOPY;  Service: Gastroenterology;  Laterality: N/A;  . NASAL SEPTUM SURGERY    . OOPHORECTOMY    . TUBAL LIGATION     1989     Home Medications:  Allergies as of 06/03/2020      Reactions   Aleve [naproxen Sodium] Other (See Comments)   Chest pain   Statins Other (See Comments)   myalgias      Medication List       Accurate as of June 03, 2020  1:12 PM. If you have any questions, ask your nurse or doctor.        acetaminophen 500 MG tablet Commonly known as: TYLENOL Tylenol Extra Strength 500 mg tablet   albuterol 108 (90 Base) MCG/ACT inhaler Commonly known as: VENTOLIN HFA Inhale 2 puffs into the lungs every 6 (six) hours as needed for wheezing or shortness of breath.   buPROPion 300 MG 24 hr tablet Commonly known as: WELLBUTRIN XL Take 1 tablet (300 mg total) by mouth daily.   cetirizine 10 MG tablet Commonly known as: ZYRTEC Take 1 tablet (10 mg total) by mouth daily as needed for allergies.   citalopram 40 MG tablet Commonly known as: CELEXA Take 1.5 tablets (60 mg total) by mouth daily.   clonazePAM 0.5 MG tablet Commonly known as: KLONOPIN TAKE 1 TABLET BY MOUTH DAILY AS NEEDED FOR ANIXETY   Cyanocobalamin 1000 MCG Tbcr Take by mouth.   fluticasone 50 MCG/ACT nasal spray Commonly known as: FLONASE SPRAY 1 SPRAY INTO EACH NOSTRIL EVERY DAY   ibuprofen 200 MG tablet Commonly known as: ADVIL Take  200 mg by mouth as needed.   ketorolac 10 MG tablet Commonly known as: TORADOL Take 1 tablet (10 mg total) by mouth every 6 (six) hours as needed.   oxyCODONE-acetaminophen 10-325 MG tablet Commonly known as: PERCOCET Take 1 tablet by mouth every 8 (eight) hours as needed for up to 5 days for pain.   propranolol ER 60 MG 24 hr capsule Commonly known as: INDERAL LA Take by mouth.   spironolactone 100 MG tablet Commonly known as: ALDACTONE Take 1 tablet (100 mg total) by mouth 2 (two) times daily.   tamsulosin 0.4 MG Caps capsule Commonly known as: FLOMAX Take 1 capsule (0.4 mg total) by mouth daily.   VITAMIN D PO Take by mouth daily. 1000 mcg       Allergies:   Allergies  Allergen Reactions  . Aleve [Naproxen Sodium] Other (See Comments)    Chest pain  . Statins Other (See Comments)    myalgias    Family History: Family History  Problem Relation Age of Onset  . Diabetes Mother   . Anemia Mother   . Hyperlipidemia Mother   . Arthritis Mother   . Hypertension Father   . Cancer Father   . Celiac disease Sister   . Diabetes Maternal Grandmother   . Stroke Maternal Grandmother   . Heart attack Maternal Grandmother   . Heart disease Paternal Grandfather     Social History:  reports that she has never smoked. She has never used smokeless tobacco. She reports current alcohol use. She reports that she does not use drugs.   Physical Exam: LMP  (LMP Unknown)   Constitutional:  Alert and oriented, No acute distress. HEENT: [place] AT, moist mucus membranes.  Trachea midline, no masses. Cardiovascular: No clubbing, cyanosis, or edema. Respiratory: Normal respiratory effort, no increased work of breathing. Neurologic: Grossly intact, no focal deficits, moving all 4 extremities. Psychiatric: Normal mood and affect.  Laboratory Data:  Urinalysis 11-30 RBC/11-30 WBC  Pertinent Imaging: CT performed 06/02/2020 was personally reviewed and interpreted.  Small, bilateral nonobstructing renal calculi.  2-3 x 4 mm right proximal ureteral calculus with mild hydronephrosis    Assessment & Plan:    1.  Right proximal ureteral calculus  KUB was obtained and the stone is faintly visualized near the L4 transverse process  It is still within the renal shadow on CT and since she took Toradol today would not be a candidate for SWL this week  Based on stone size would recommend an initial trial of passage and she will continue tamsulosin  Rx Zofran sent to pharmacy should her nausea return  Follow-up KUB Monday of next week  Instructed call earlier for worsening pain or development of fever  Urine culture ordered  Potential treatment options were  discussed including shockwave lithotripsy and ureteroscopy We discussed that management is based on stone size, location, density, patient co-morbidities, and patient preference.  Stones <51mm in size have a >80% spontaneous passage rate. Data surrounding the use of tamsulosin for medical expulsive therapy is controversial, but meta analyses suggests it is most efficacious for distal stones between 5-92mm in size.  SWL has a lower stone free rate in a single procedure, but also a lower complication rate compared to ureteroscopy and avoids a stent and associated stent related symptoms. Possible complications include renal hematoma, steinstrasse, and need for additional treatment. Ureteroscopy with laser lithotripsy and stent placement has a higher stone free rate than SWL in a single procedure, however increased complication rate  including possible infection, ureteral injury, bleeding, and stent related morbidity. Common stent related symptoms include dysuria, urgency/frequency, and flank pain.    [name], [place] [address], [place], [place] [address] [phone]

## 2020-06-03 NOTE — Telephone Encounter (Signed)
I have called [facility] and scheduled the patient for an appointment @ 12:45 today. I did call patient and she is aware.

## 2020-06-04 LAB — URINALYSIS, COMPLETE
Bilirubin, UA: NEGATIVE
Glucose, UA: NEGATIVE
Ketones, UA: NEGATIVE
Nitrite, UA: NEGATIVE
Specific Gravity, UA: >1.03 — ABNORMAL HIGH (ref 1.005–1.030)
Urobilinogen, Ur: 0.2 mg/dL (ref 0.2–1.0)
pH, UA: 5.5 (ref 5.0–7.5)

## 2020-06-04 LAB — MICROSCOPIC EXAMINATION

## 2020-06-05 ENCOUNTER — Telehealth: Payer: Self-pay | Admitting: *Deleted

## 2020-06-05 NOTE — Telephone Encounter (Signed)
error 

## 2020-06-06 LAB — CULTURE, URINE COMPREHENSIVE

## 2020-06-07 ENCOUNTER — Encounter: Payer: Self-pay | Admitting: Urology

## 2020-06-08 ENCOUNTER — Other Ambulatory Visit: Payer: Self-pay

## 2020-06-08 ENCOUNTER — Ambulatory Visit
Admission: RE | Admit: 2020-06-08 | Discharge: 2020-06-08 | Disposition: A | Discharge status: Home / Self Care | Payer: Self-pay | Source: Ambulatory Visit | Attending: Urology | Admitting: Urology

## 2020-06-08 DIAGNOSIS — N200 Calculus of kidney: Secondary | ICD-10-CM | POA: Insufficient documentation

## 2020-06-09 ENCOUNTER — Telehealth: Payer: Self-pay | Admitting: Urology

## 2020-06-09 NOTE — Telephone Encounter (Signed)
I do not see the stone on yesterday's x-ray.  Is she still having pain or is she aware of passing the stone?

## 2020-06-10 NOTE — Telephone Encounter (Signed)
Notified patient as instructed, Patient states she is doing well this morning . Will call the office if she needs anything.

## 2020-06-19 ENCOUNTER — Other Ambulatory Visit: Payer: Self-pay | Admitting: Family Medicine

## 2020-07-03 ENCOUNTER — Other Ambulatory Visit: Payer: Self-pay | Admitting: Family Medicine

## 2020-07-03 NOTE — Telephone Encounter (Signed)
Routing to provider  

## 2020-07-03 NOTE — Telephone Encounter (Signed)
Requested medication (s) are due for refill today: No  Requested medication (s) are on the active medication list: Yes  Last refill:  06/19/20  Future visit scheduled: Yes  Notes to clinic:  Pharmacy requesting 90 day supply.    Requested Prescriptions  Pending Prescriptions Disp Refills   tamsulosin (FLOMAX) 0.4 MG CAPS capsule [Pharmacy Med Name: TAMSULOSIN HCL 0.4 MG CAPSULE] 90 capsule 1    Sig: TAKE 1 CAPSULE BY MOUTH [place]      Urology: Alpha-Adrenergic Blocker Passed - 07/03/2020  8:40 AM      Passed - Last BP in normal range    BP Readings from Last 1 Encounters:  06/03/20 127/73          Passed - Valid encounter within last 12 months    Recent Outpatient Visits           1 month ago Gross hematuria   [place], Megan P, DO   2 months ago Routine general medical examination at a health care facility   [facility] [place] P, DO   3 months ago Acute left-sided low back pain without sciatica   [place], Megan P, DO   4 months ago Neck pain   [place], [place], DO   6 months ago Tremor   [place], [place], DO       Future Appointments             In 3 months Wynetta Emery, Barb Merino, DO [employer]

## 2020-08-14 ENCOUNTER — Ambulatory Visit (INDEPENDENT_AMBULATORY_CARE_PROVIDER_SITE_OTHER): Payer: BC Managed Care – PPO | Admitting: Family Medicine

## 2020-08-14 ENCOUNTER — Encounter: Payer: Self-pay | Admitting: Family Medicine

## 2020-08-14 ENCOUNTER — Other Ambulatory Visit: Payer: Self-pay

## 2020-08-14 VITALS — BP 126/79 | HR 66 | Temp 98.6°F | Wt 213.0 lb

## 2020-08-14 DIAGNOSIS — M9904 Segmental and somatic dysfunction of sacral region: Secondary | ICD-10-CM

## 2020-08-14 DIAGNOSIS — M9901 Segmental and somatic dysfunction of cervical region: Secondary | ICD-10-CM | POA: Diagnosis not present

## 2020-08-14 DIAGNOSIS — M9900 Segmental and somatic dysfunction of head region: Secondary | ICD-10-CM

## 2020-08-14 DIAGNOSIS — M9909 Segmental and somatic dysfunction of abdomen and other regions: Secondary | ICD-10-CM | POA: Diagnosis not present

## 2020-08-14 DIAGNOSIS — M545 Low back pain, unspecified: Secondary | ICD-10-CM | POA: Diagnosis not present

## 2020-08-14 DIAGNOSIS — M9903 Segmental and somatic dysfunction of lumbar region: Secondary | ICD-10-CM | POA: Diagnosis not present

## 2020-08-14 DIAGNOSIS — M9905 Segmental and somatic dysfunction of pelvic region: Secondary | ICD-10-CM

## 2020-08-14 MED ORDER — CETIRIZINE HCL 10 MG PO TABS: 10.0000 mg | ORAL_TABLET | Freq: Every day | ORAL | 3 refills | Status: DC | PRN

## 2020-08-14 NOTE — Progress Notes (Signed)
BP 126/79   Pulse 66   Temp 98.6 F (37 C)   Wt 213 lb (96.6 kg)   LMP  (LMP Unknown)   SpO2 98%   BMI 36.56 kg/m    Subjective:    Patient ID: Tracie Sheppard, female    DOB: [birth year], 65 y.o.   MRN: [id number]  HPI: Tracie Sheppard is a 65 y.o. female  Chief Complaint  Patient presents with  . Back Pain   Continues with numbness in her L hand  BACK PAIN- Back is acting up. She thinks some of it is her weight which has been going up.  Duration: chronic, worse in the last couple of months Mechanism of injury: no trauma Location: bilateral and low back Onset: gradual Severity: severe Quality: dull aching, occassionally sharp Frequency: intermittent Radiation: into R leg above the knee Aggravating factors: walking, squatting Alleviating factors: NSAIDs, rest Status: worse Treatments attempted: rest, ice, heat, APAP, ibuprofen, aleve, HEP and OMM  Relief with NSAIDs?: moderate Nighttime pain:  no Paresthesias / decreased sensation:  no Bowel / bladder incontinence:  no Fevers:  no Dysuria / urinary frequency:  no  Relevant past medical, surgical, family and social history reviewed and updated as indicated. Interim medical history since our last visit reviewed. Allergies and medications reviewed and updated.  Review of Systems  Constitutional: Negative.   Respiratory: Negative.   Cardiovascular: Negative.   Gastrointestinal: Negative.   Musculoskeletal: Positive for back pain and myalgias. Negative for arthralgias, gait problem, joint swelling, neck pain and neck stiffness.  Skin: Negative.   Neurological: Negative.   Psychiatric/Behavioral: Negative.     Per HPI unless specifically indicated above     Objective:    BP 126/79   Pulse 66   Temp 98.6 F (37 C)   Wt 213 lb (96.6 kg)   LMP  (LMP Unknown)   SpO2 98%   BMI 36.56 kg/m   Wt Readings from Last 3 Encounters:  08/14/20 213 lb (96.6 kg)  06/03/20 212 lb (96.2 kg)  05/28/20 212 lb 3.2 oz  (96.3 kg)    Physical Exam Vitals and nursing note reviewed.  Constitutional:      General: She is not in acute distress.    Appearance: Normal appearance. She is not ill-appearing.  HENT:     Head: Normocephalic and atraumatic.     Right Ear: External ear normal.     Left Ear: External ear normal.     Nose: Nose normal.     Mouth/Throat:     Mouth: Mucous membranes are moist.     Pharynx: Oropharynx is clear.  Eyes:     Extraocular Movements: Extraocular movements intact.     Conjunctiva/sclera: Conjunctivae normal.     Pupils: Pupils are equal, round, and reactive to light.  Neck:     Vascular: No carotid bruit.  Cardiovascular:     Rate and Rhythm: Normal rate.     Pulses: Normal pulses.  Pulmonary:     Effort: Pulmonary effort is normal. No respiratory distress.  Abdominal:     General: Abdomen is flat. There is no distension.     Palpations: Abdomen is soft. There is no mass.     Tenderness: There is no abdominal tenderness. There is no right CVA tenderness, left CVA tenderness, guarding or rebound.     Hernia: No hernia is present.  Musculoskeletal:     Cervical back: No muscular tenderness.  Lymphadenopathy:     Cervical: No  cervical adenopathy.  Skin:    General: Skin is warm and dry.     Capillary Refill: Capillary refill takes less than 2 seconds.     Coloration: Skin is not jaundiced or pale.     Findings: No bruising, erythema, lesion or rash.  Neurological:     General: No focal deficit present.     Mental Status: She is alert. Mental status is at baseline.  Psychiatric:        Mood and Affect: Mood normal.        Behavior: Behavior normal.        Thought Content: Thought content normal.        Judgment: Judgment normal.   Musculoskeletal:  Exam found Decreased ROM, Tissue texture changes, Tenderness to palpation and Asymmetry of patient's  head, neck, lumbar, pelvis, sacrum and abdomen Osteopathic Structural Exam:   Head: hypertonic suboccipital  muscles  Neck: [id number]  Lumbar: QL hypertonic on the R  Pelvis: Anterior R innominate  Sacrum: R on R torsion  Abdomen: diaphragm hypertonic bilaterally R>L   Results for orders placed or performed in visit on 06/03/20  CULTURE, URINE COMPREHENSIVE   Specimen: Urine   UR  Result Value Ref Range   Urine Culture, Comprehensive Final report    Organism ID, Bacteria Comment   Microscopic Examination   Urine  Result Value Ref Range   WBC, UA 11-30 (A) 0 - 5 /hpf   RBC 11-30 (A) 0 - 2 /hpf   Epithelial Cells (non renal) 0-10 0 - 10 /hpf   Bacteria, UA Few None seen/Few  Urinalysis, Complete  Result Value Ref Range   Specific Gravity, UA >1.030 (H) 1.005 - 1.030   pH, UA 5.5 5.0 - 7.5   Color, UA Yellow Yellow   Appearance Ur Hazy (A) Clear   Leukocytes,UA Trace (A) Negative   Protein,UA Trace (A) Negative/Trace   Glucose, UA Negative Negative   Ketones, UA Negative Negative   RBC, UA 2+ (A) Negative   Bilirubin, UA Negative Negative   Urobilinogen, Ur 0.2 0.2 - 1.0 mg/dL   Nitrite, UA Negative Negative   Microscopic Examination See below:       Assessment & Plan:   Problem List Items Addressed This Visit      Other   Low back pain - Primary    In subacute exacerbation without single cause. Likely due to deconditioning. Discussed doing PT- she would like to hold for now, but will call if she'd like to get it started. Work on diet and exercise to drop some weight. She does have somatic dysfunction that is contributing to her symptoms. Treated today with good results as below.       Relevant Orders   DG Lumbar Spine Complete    Other Visit Diagnoses    Head region somatic dysfunction       Cervical segment dysfunction       Somatic dysfunction of sacral region       Somatic dysfunction of lumbar region       Segmental dysfunction of abdomen       Somatic dysfunction of pelvis region         After verbal consent was obtained, patient was treated today with osteopathic  manipulative medicine to the regions of the head, neck, lumbar, pelvis, sacrum and abdomen using the techniques of myofascial release, counterstrain, muscle energy, HVLA and soft tissue. Areas of compensation relating to her primary pain source also treated. Patient tolerated the  procedure well with good objective and good subjective improvement in symptoms. She left the room in good condition. She was advised to stay well hydrated and that she may have some soreness following the procedure. If not improving or worsening, she will call and come in. She will return for reevaluation  on a PRN basis.   Follow up plan: Return in about 2 weeks (around 08/28/2020) for follow up back pain.

## 2020-08-16 ENCOUNTER — Encounter: Payer: Self-pay | Admitting: Family Medicine

## 2020-08-16 NOTE — Assessment & Plan Note (Signed)
In subacute exacerbation without single cause. Likely due to deconditioning. Discussed doing PT- she would like to hold for now, but will call if she'd like to get it started. Work on diet and exercise to drop some weight. She does have somatic dysfunction that is contributing to her symptoms. Treated today with good results as below.

## 2020-08-17 ENCOUNTER — Encounter: Payer: Self-pay | Admitting: Family Medicine

## 2020-08-17 ENCOUNTER — Other Ambulatory Visit: Payer: Self-pay | Admitting: Family Medicine

## 2020-08-17 MED ORDER — NAPROXEN 500 MG PO TABS: 500.0000 mg | ORAL_TABLET | Freq: Two times a day (BID) | ORAL | 0 refills | Status: DC

## 2020-08-19 ENCOUNTER — Other Ambulatory Visit: Payer: Self-pay

## 2020-08-19 ENCOUNTER — Ambulatory Visit
Admission: RE | Admit: 2020-08-19 | Discharge: 2020-08-19 | Disposition: A | Discharge status: Home / Self Care | Payer: BC Managed Care – PPO | Attending: Family Medicine | Admitting: Family Medicine

## 2020-08-19 ENCOUNTER — Ambulatory Visit
Admission: RE | Admit: 2020-08-19 | Discharge: 2020-08-19 | Disposition: A | Discharge status: Home / Self Care | Payer: BC Managed Care – PPO | Source: Ambulatory Visit | Attending: Family Medicine | Admitting: Family Medicine

## 2020-08-19 DIAGNOSIS — M545 Low back pain, unspecified: Secondary | ICD-10-CM | POA: Insufficient documentation

## 2020-08-28 ENCOUNTER — Other Ambulatory Visit: Payer: Self-pay | Admitting: Family Medicine

## 2020-08-28 ENCOUNTER — Encounter: Payer: Self-pay | Admitting: Family Medicine

## 2020-08-28 ENCOUNTER — Other Ambulatory Visit: Payer: Self-pay

## 2020-08-28 ENCOUNTER — Ambulatory Visit: Payer: BC Managed Care – PPO | Admitting: Family Medicine

## 2020-08-28 VITALS — BP 111/73 | HR 77 | Temp 98.4°F | Wt 213.0 lb

## 2020-08-28 DIAGNOSIS — M545 Low back pain, unspecified: Secondary | ICD-10-CM

## 2020-08-28 DIAGNOSIS — R232 Flushing: Secondary | ICD-10-CM

## 2020-08-28 MED ORDER — NAPROXEN 500 MG PO TABS: 500.0000 mg | ORAL_TABLET | Freq: Two times a day (BID) | ORAL | 0 refills | Status: DC

## 2020-08-28 MED ORDER — PREMARIN 0.625 MG/GM VA CREA: 1.0000 | TOPICAL_CREAM | Freq: Every day | VAGINAL | 12 refills | Status: DC

## 2020-08-28 MED ORDER — COMBIPATCH 0.05-0.14 MG/DAY TD PTTW: 1.0000 | MEDICATED_PATCH | TRANSDERMAL | 12 refills | Status: DC

## 2020-08-28 NOTE — Progress Notes (Signed)
BP 111/73   Pulse 77   Temp 98.4 F (36.9 C)   Wt 213 lb (96.6 kg)   LMP  (LMP Unknown)   SpO2 97%   BMI 36.56 kg/m    Subjective:    Patient ID: Tracie Sheppard, female    DOB: 12/06/1955, 65 y.o.   MRN: [id number]  HPI: Tracie Sheppard is a 65 y.o. female  Chief Complaint  Patient presents with  . Back Pain    Follow up, patient began PT.    Back is doing pretty well. She started PT. They have been working her hard. Has not been doing the pool. Pain is aching and sore. Better with rest, PT and OMT, worse with activity. Pain does not radiate. It comes and goes. She is otherwise doing OK. Did not get the naproxen, because it was not enteric coated.   MENOPAUSAL SYMPTOMS Duration: chronic Status: uncontrolled Symptom severity: moderate Hot flashes: yes Night sweats: yes Sleep disturbances: yes Vaginal dryness: yes Dyspareunia:no Decreased libido: no Emotional lability: no Stress incontinence: no Previous HRT/pharmacotherapy: no Hysterectomy: no Absolute Contraindications to Hormonal Therapy:     Undiagnosed vaginal bleeding: no    Breast cancer: no    Endometrial cancer: no    Coronary disease: no    Cerebrovascular disease: no    Venous thromboembolic disease: no   Relevant past medical, surgical, family and social history reviewed and updated as indicated. Interim medical history since our last visit reviewed. Allergies and medications reviewed and updated.  Review of Systems  Constitutional: Negative.   Respiratory: Negative.   Cardiovascular: Negative.   Gastrointestinal: Negative.   Musculoskeletal: Positive for back pain and myalgias. Negative for arthralgias, gait problem, joint swelling, neck pain and neck stiffness.  Skin: Negative.   Neurological: Negative.   Psychiatric/Behavioral: Negative.     Per HPI unless specifically indicated above     Objective:    BP 111/73   Pulse 77   Temp 98.4 F (36.9 C)   Wt 213 lb (96.6 kg)   LMP  (LMP  Unknown)   SpO2 97%   BMI 36.56 kg/m   Wt Readings from Last 3 Encounters:  08/28/20 213 lb (96.6 kg)  08/14/20 213 lb (96.6 kg)  06/03/20 212 lb (96.2 kg)    Physical Exam Vitals and nursing note reviewed.  Constitutional:      General: She is not in acute distress.    Appearance: Normal appearance. She is not ill-appearing, toxic-appearing or diaphoretic.  HENT:     Head: Normocephalic and atraumatic.     Right Ear: External ear normal.     Left Ear: External ear normal.     Nose: Nose normal.     Mouth/Throat:     Mouth: Mucous membranes are moist.     Pharynx: Oropharynx is clear.  Eyes:     General: No scleral icterus.       Right eye: No discharge.        Left eye: No discharge.     Extraocular Movements: Extraocular movements intact.     Conjunctiva/sclera: Conjunctivae normal.     Pupils: Pupils are equal, round, and reactive to light.  Cardiovascular:     Rate and Rhythm: Normal rate and regular rhythm.     Pulses: Normal pulses.     Heart sounds: Normal heart sounds. No murmur heard. No friction rub. No gallop.   Pulmonary:     Effort: Pulmonary effort is normal. No respiratory distress.  Breath sounds: Normal breath sounds. No stridor. No wheezing, rhonchi or rales.  Chest:     Chest wall: No tenderness.  Musculoskeletal:        General: Normal range of motion.     Cervical back: Normal range of motion and neck supple.  Skin:    General: Skin is warm and dry.     Capillary Refill: Capillary refill takes less than 2 seconds.     Coloration: Skin is not jaundiced or pale.     Findings: No bruising, erythema, lesion or rash.  Neurological:     General: No focal deficit present.     Mental Status: She is alert and oriented to person, place, and time. Mental status is at baseline.  Psychiatric:        Mood and Affect: Mood normal.        Behavior: Behavior normal.        Thought Content: Thought content normal.        Judgment: Judgment normal.      Results for orders placed or performed in visit on 06/03/20  CULTURE, URINE COMPREHENSIVE   Specimen: Urine   UR  Result Value Ref Range   Urine Culture, Comprehensive Final report    Organism ID, Bacteria Comment   Microscopic Examination   Urine  Result Value Ref Range   WBC, UA 11-30 (A) 0 - 5 /hpf   RBC 11-30 (A) 0 - 2 /hpf   Epithelial Cells (non renal) 0-10 0 - 10 /hpf   Bacteria, UA Few None seen/Few  Urinalysis, Complete  Result Value Ref Range   Specific Gravity, UA >1.030 (H) 1.005 - 1.030   pH, UA 5.5 5.0 - 7.5   Color, UA Yellow Yellow   Appearance Ur Hazy (A) Clear   Leukocytes,UA Trace (A) Negative   Protein,UA Trace (A) Negative/Trace   Glucose, UA Negative Negative   Ketones, UA Negative Negative   RBC, UA 2+ (A) Negative   Bilirubin, UA Negative Negative   Urobilinogen, Ur 0.2 0.2 - 1.0 mg/dL   Nitrite, UA Negative Negative   Microscopic Examination See below:       Assessment & Plan:   Problem List Items Addressed This Visit      Other   Low back pain - Primary    Doing much better with PT. Starting to feel more like herself. Continue PT. Recheck 2 months at physical. Call with any concerns.      Relevant Medications   naproxen (NAPROSYN) 500 MG tablet    Other Visit Diagnoses    Hot flashes       Will try premarin. Recheck 2 months at physical. Call with any concerns.        Follow up plan: Return July physical.

## 2020-08-28 NOTE — Telephone Encounter (Signed)
Pt was seen on 08/28/2020

## 2020-08-28 NOTE — Telephone Encounter (Signed)
Requested medication (s) are due for refill today: see Rx request  Requested medication (s) are on the active medication list: yes   Last refill:  08/28/20 42.5g 12 refills   Future visit scheduled: yes in 1 month   Notes to clinic:  see pharmacy note:Pharmacy comment: Alternative Requested:NOT COVERED PLEASE SUB.     Requested Prescriptions  Pending Prescriptions Disp Refills   Estradiol 10 MCG TABS vaginal tablet [Pharmacy Med Name: ESTRADIOL 10 MCG VAGINAL INSRT]  0      OB/GYN:  Estrogens Failed - 08/28/2020 11:38 AM      Failed - Mammogram is up-to-date per Health Maintenance      Passed - Last BP in normal range    BP Readings from Last 1 Encounters:  08/28/20 111/73          Passed - Valid encounter within last 12 months    Recent Outpatient Visits           Today Acute left-sided low back pain without sciatica   [facility] [place], Megan P, DO   2 weeks ago Acute left-sided low back pain without sciatica   [place], Megan P, DO   3 months ago Gross hematuria   [facility], Megan P, DO   4 months ago Routine general medical examination at a health care facility   [facility], [place] P, DO   5 months ago Acute left-sided low back pain without sciatica   [facility] Valerie Roys, DO       Future Appointments             In 1 month Wynetta Emery, Barb Merino, DO [facility]

## 2020-08-28 NOTE — Assessment & Plan Note (Signed)
Doing much better with PT. Starting to feel more like herself. Continue PT. Recheck 2 months at physical. Call with any concerns.

## 2020-10-13 ENCOUNTER — Ambulatory Visit: Payer: BC Managed Care – PPO | Admitting: Family Medicine

## 2020-10-19 ENCOUNTER — Other Ambulatory Visit: Payer: Self-pay | Admitting: Family Medicine

## 2020-10-19 ENCOUNTER — Ambulatory Visit: Payer: BC Managed Care – PPO | Admitting: Family Medicine

## 2020-10-29 LAB — HM MAMMOGRAPHY

## 2020-11-03 ENCOUNTER — Ambulatory Visit: Payer: BC Managed Care – PPO | Admitting: Family Medicine

## 2020-11-03 ENCOUNTER — Other Ambulatory Visit: Payer: Self-pay

## 2020-11-03 ENCOUNTER — Encounter: Payer: Self-pay | Admitting: Family Medicine

## 2020-11-03 VITALS — BP 113/72 | HR 76 | Temp 97.8°F | Ht 64.9 in | Wt 215.0 lb

## 2020-11-03 DIAGNOSIS — I10 Essential (primary) hypertension: Secondary | ICD-10-CM

## 2020-11-03 DIAGNOSIS — E079 Disorder of thyroid, unspecified: Secondary | ICD-10-CM

## 2020-11-03 DIAGNOSIS — E782 Mixed hyperlipidemia: Secondary | ICD-10-CM

## 2020-11-03 DIAGNOSIS — R7301 Impaired fasting glucose: Secondary | ICD-10-CM

## 2020-11-03 DIAGNOSIS — E559 Vitamin D deficiency, unspecified: Secondary | ICD-10-CM

## 2020-11-03 DIAGNOSIS — F331 Major depressive disorder, recurrent, moderate: Secondary | ICD-10-CM

## 2020-11-03 LAB — MICROSCOPIC EXAMINATION: WBC, UA: NONE SEEN /hpf (ref 0–5)

## 2020-11-03 LAB — URINALYSIS, ROUTINE W REFLEX MICROSCOPIC
Bilirubin, UA: NEGATIVE
Glucose, UA: NEGATIVE
Ketones, UA: NEGATIVE
Leukocytes,UA: NEGATIVE
Nitrite, UA: NEGATIVE
Protein,UA: NEGATIVE
Specific Gravity, UA: >1.03 — ABNORMAL HIGH (ref 1.005–1.030)
Urobilinogen, Ur: 1 mg/dL (ref 0.2–1.0)
pH, UA: 6 (ref 5.0–7.5)

## 2020-11-03 LAB — BAYER DCA HB A1C WAIVED: HB A1C (BAYER DCA - WAIVED): 5.7 % (ref <7.0)

## 2020-11-03 MED ORDER — NAPROXEN 500 MG PO TABS: 500.0000 mg | ORAL_TABLET | Freq: Two times a day (BID) | ORAL | 0 refills | Status: DC

## 2020-11-03 MED ORDER — CITALOPRAM HYDROBROMIDE 40 MG PO TABS: 60.0000 mg | ORAL_TABLET | Freq: Every day | ORAL | 1 refills | Status: DC

## 2020-11-03 MED ORDER — ALBUTEROL SULFATE HFA 108 (90 BASE) MCG/ACT IN AERS: 2.0000 | INHALATION_SPRAY | Freq: Four times a day (QID) | RESPIRATORY_TRACT | 3 refills | Status: DC | PRN

## 2020-11-03 MED ORDER — SPIRONOLACTONE 100 MG PO TABS: 100.0000 mg | ORAL_TABLET | Freq: Two times a day (BID) | ORAL | 1 refills | Status: DC

## 2020-11-03 MED ORDER — CLONAZEPAM 0.5 MG PO TABS: ORAL_TABLET | ORAL | 0 refills | Status: DC

## 2020-11-03 NOTE — Progress Notes (Signed)
BP 113/72   Pulse 76   Temp 97.8 F (36.6 C) (Oral)   Ht 5' 4.9" (1.648 m)   Wt 215 lb (97.5 kg)   LMP  (LMP Unknown)   SpO2 98%   BMI 35.89 kg/m    Subjective:    Patient ID: Tracie Sheppard, female    DOB: [birth year], 65 y.o.   MRN: [id number]  HPI: Tracie Sheppard is a 65 y.o. female  Chief Complaint  Patient presents with   Depression   Hypertension   MENOPAUSAL SYMPTOMS Duration: chronic Symptom severity:  better Hot flashes: no Night sweats: no Sleep disturbances: 1x a night Vaginal dryness: no Dyspareunia:no Decreased libido: no Emotional lability: no Stress incontinence: no Previous HRT/pharmacotherapy: yes Hysterectomy: no Absolute Contraindications to Hormonal Therapy:     Undiagnosed vaginal bleeding: no    Breast cancer: no    Endometrial cancer: no    Coronary disease: no    Cerebrovascular disease: no    Venous thromboembolic disease: no  HYPERTENSION / HYPERLIPIDEMIA Satisfied with current treatment? yes Duration of hypertension: chronic BP monitoring frequency: not checking BP medication side effects: no Past BP meds: spironalactone Duration of hyperlipidemia: chronic Cholesterol medication side effects: yes Cholesterol supplements: none Past cholesterol medications: none- myalgias on statins Medication compliance: excellent compliance Aspirin: no Recent stressors: no Recurrent headaches: no Visual changes: no Palpitations: no Dyspnea: no Chest pain: no Lower extremity edema: no Dizzy/lightheaded: no  Impaired Fasting Glucose HbA1C:  Lab Results  Component Value Date   HGBA1C 5.7 11/03/2020   Duration of elevated blood sugar: chronic Polydipsia: no Polyuria: no Weight change: no Visual disturbance: no Glucose Monitoring: no Diabetic Education: Not Completed Family history of diabetes: yes  ANXIETY/STRESS Duration:uncontrolled Anxious mood: yes  Excessive worrying: yes Irritability: no  Sweating: no Nausea:  no Palpitations:no Hyperventilation: no Panic attacks: no Agoraphobia: no  Obscessions/compulsions: yes Depressed mood: yes Depression screen [facility] 2/9 11/03/2020 08/28/2020 04/09/2020 02/21/2020 01/02/2020  Decreased Interest '1 1 1 '$ 0 1  Down, Depressed, Hopeless 1 0 0 1 1  PHQ - 2 Score '2 1 1 1 2  '$ Altered sleeping '3 3 3 3 2  '$ Tired, decreased energy '2 1 2 1 2  '$ Change in appetite '3 3 1 3 2  '$ Feeling bad or failure about yourself  0 0 1 0 0  Trouble concentrating 0 1 0 0 1  Moving slowly or fidgety/restless 0 0 0 0 0  Suicidal thoughts 0 0 0 0 0  PHQ-9 Score '10 9 8 8 9  '$ Difficult doing work/chores Not difficult at all Not difficult at all Somewhat difficult Somewhat difficult Somewhat difficult  Some recent data might be hidden   Anhedonia: no Weight changes: no Insomnia: yes hard to fall asleep  Hypersomnia: no Fatigue/loss of energy: yes Feelings of worthlessness: no Feelings of guilt: yes Impaired concentration/indecisiveness: no Suicidal ideations: no  Crying spells: no Recent Stressors/Life Changes: yes   Relationship problems: no   Family stress: yes     Financial stress: no    Job stress: no    Recent death/loss: no   Relevant past medical, surgical, family and social history reviewed and updated as indicated. Interim medical history since our last visit reviewed. Allergies and medications reviewed and updated.  Review of Systems  Constitutional: Negative.   Respiratory: Negative.    Cardiovascular: Negative.   Gastrointestinal: Negative.   Musculoskeletal: Negative.   Neurological: Negative.   Psychiatric/Behavioral: Negative.     Per HPI unless  specifically indicated above     Objective:    BP 113/72   Pulse 76   Temp 97.8 F (36.6 C) (Oral)   Ht 5' 4.9" (1.648 m)   Wt 215 lb (97.5 kg)   LMP  (LMP Unknown)   SpO2 98%   BMI 35.89 kg/m   Wt Readings from Last 3 Encounters:  11/03/20 215 lb (97.5 kg)  08/28/20 213 lb (96.6 kg)  08/14/20 213 lb (96.6  kg)    Physical Exam Vitals and nursing note reviewed.  Constitutional:      General: She is not in acute distress.    Appearance: Normal appearance. She is not ill-appearing, toxic-appearing or diaphoretic.  HENT:     Head: Normocephalic and atraumatic.     Right Ear: External ear normal.     Left Ear: External ear normal.     Nose: Nose normal.     Mouth/Throat:     Mouth: Mucous membranes are moist.     Pharynx: Oropharynx is clear.  Eyes:     General: No scleral icterus.       Right eye: No discharge.        Left eye: No discharge.     Extraocular Movements: Extraocular movements intact.     Conjunctiva/sclera: Conjunctivae normal.     Pupils: Pupils are equal, round, and reactive to light.  Cardiovascular:     Rate and Rhythm: Normal rate and regular rhythm.     Pulses: Normal pulses.     Heart sounds: Normal heart sounds. No murmur heard.   No friction rub. No gallop.  Pulmonary:     Effort: Pulmonary effort is normal. No respiratory distress.     Breath sounds: Normal breath sounds. No stridor. No wheezing, rhonchi or rales.  Chest:     Chest wall: No tenderness.  Musculoskeletal:        General: Normal range of motion.     Cervical back: Normal range of motion and neck supple.  Skin:    General: Skin is warm and dry.     Capillary Refill: Capillary refill takes less than 2 seconds.     Coloration: Skin is not jaundiced or pale.     Findings: No bruising, erythema, lesion or rash.  Neurological:     General: No focal deficit present.     Mental Status: She is alert and oriented to person, place, and time. Mental status is at baseline.  Psychiatric:        Mood and Affect: Mood normal.        Behavior: Behavior normal.        Thought Content: Thought content normal.        Judgment: Judgment normal.    Results for orders placed or performed in visit on 11/03/20  Microscopic Examination   BLD  Result Value Ref Range   WBC, UA None seen 0 - 5 /hpf   RBC 0-2  0 - 2 /hpf   Epithelial Cells (non renal) 0-10 0 - 10 /hpf   Mucus, UA Present (A) Not Estab.   Bacteria, UA Few (A) None seen/Few  HM MAMMOGRAPHY  Result Value Ref Range   HM Mammogram 0-4 Bi-Rad 0-4 Bi-Rad, Self Reported Normal  Bayer DCA Hb A1c Waived  Result Value Ref Range   HB A1C (BAYER DCA - WAIVED) 5.7 <7.0 %  Urinalysis, Routine w reflex microscopic  Result Value Ref Range   Specific Gravity, UA >1.030 (H) 1.005 - 1.030  pH, UA 6.0 5.0 - 7.5   Color, UA Yellow Yellow   Appearance Ur Clear Clear   Leukocytes,UA Negative Negative   Protein,UA Negative Negative/Trace   Glucose, UA Negative Negative   Ketones, UA Negative Negative   RBC, UA Trace (A) Negative   Bilirubin, UA Negative Negative   Urobilinogen, Ur 1.0 0.2 - 1.0 mg/dL   Nitrite, UA Negative Negative   Microscopic Examination See below:       Assessment & Plan:   Problem List Items Addressed This Visit       Cardiovascular and Mediastinum   HTN (hypertension) - Primary    Under good control on current regimen. Continue current regimen. Continue to monitor. Call with any concerns. Refills given. Labs drawn today.        Relevant Medications   spironolactone (ALDACTONE) 100 MG tablet   Other Relevant Orders   CBC with Differential/Platelet   Comprehensive metabolic panel   Urinalysis, Routine w reflex microscopic (Completed)     Endocrine   Thyroid disease    Rechecking labs today. Await results. Treat as needed.        Relevant Orders   CBC with Differential/Platelet   Comprehensive metabolic panel   IFG (impaired fasting glucose)    Doing well with A1c of 5.7. Continue diet and exercise. Call with any concerns.        Relevant Orders   CBC with Differential/Platelet   Bayer DCA Hb A1c Waived (Completed)   Comprehensive metabolic panel   Urinalysis, Routine w reflex microscopic (Completed)     Other   Hyperlipidemia    Rechecking labs today. Await results. Treat as needed.         Relevant Medications   spironolactone (ALDACTONE) 100 MG tablet   Other Relevant Orders   CBC with Differential/Platelet   Comprehensive metabolic panel   Lipid Panel w/o Chol/HDL Ratio   Depression, major, recurrent, moderate (HCC)    Under good control on current regimen. Continue current regimen. Continue to monitor. Call with any concerns. Refills given. Labs drawn today.        Relevant Medications   citalopram (CELEXA) 40 MG tablet   Other Relevant Orders   CBC with Differential/Platelet   Comprehensive metabolic panel   Other Visit Diagnoses     Vitamin D deficiency       Rechecking labs today. Await results.    Relevant Orders   CBC with Differential/Platelet   Comprehensive metabolic panel   VITAMIN D 25 Hydroxy (Vit-D Deficiency, Fractures)        Follow up plan: Return in about 6 months (around 05/06/2021) for physical.

## 2020-11-03 NOTE — Assessment & Plan Note (Signed)
Rechecking labs today. Await results. Treat as needed.  °

## 2020-11-03 NOTE — Assessment & Plan Note (Signed)
Under good control on current regimen. Continue current regimen. Continue to monitor. Call with any concerns. Refills given. Labs drawn today.   

## 2020-11-03 NOTE — Assessment & Plan Note (Signed)
Doing well with A1c of 5.7. Continue diet and exercise. Call with any concerns.

## 2020-11-04 LAB — CBC WITH DIFFERENTIAL/PLATELET
Basophils Absolute: 0.1 10*3/uL (ref 0.0–0.2)
Basos: 1 %
EOS (ABSOLUTE): 0.1 10*3/uL (ref 0.0–0.4)
Eos: 2 %
Hematocrit: 41.4 % (ref 34.0–46.6)
Hemoglobin: 13.6 g/dL (ref 11.1–15.9)
Immature Grans (Abs): 0 10*3/uL (ref 0.0–0.1)
Immature Granulocytes: 0 %
Lymphocytes Absolute: 4.8 10*3/uL — ABNORMAL HIGH (ref 0.7–3.1)
Lymphs: 51 %
MCH: 29.3 pg (ref 26.6–33.0)
MCHC: 32.9 g/dL (ref 31.5–35.7)
MCV: 89 fL (ref 79–97)
Monocytes Absolute: 1 10*3/uL — ABNORMAL HIGH (ref 0.1–0.9)
Monocytes: 10 %
Neutrophils Absolute: 3.3 10*3/uL (ref 1.4–7.0)
Neutrophils: 36 %
Platelets: 235 10*3/uL (ref 150–450)
RBC: 4.64 x10E6/uL (ref 3.77–5.28)
RDW: 12.3 % (ref 11.7–15.4)
WBC: 9.2 10*3/uL (ref 3.4–10.8)

## 2020-11-04 LAB — COMPREHENSIVE METABOLIC PANEL
ALT: 36 IU/L — ABNORMAL HIGH (ref 0–32)
AST: 26 IU/L (ref 0–40)
Albumin/Globulin Ratio: 2 (ref 1.2–2.2)
Albumin: 4.7 g/dL (ref 3.8–4.8)
Alkaline Phosphatase: 85 IU/L (ref 44–121)
BUN/Creatinine Ratio: 13 (ref 12–28)
BUN: 14 mg/dL (ref 8–27)
Bilirubin Total: 1.1 mg/dL (ref 0.0–1.2)
CO2: 26 mmol/L (ref 20–29)
Calcium: 9.7 mg/dL (ref 8.7–10.3)
Chloride: 103 mmol/L (ref 96–106)
Creatinine, Ser: 1.06 mg/dL — ABNORMAL HIGH (ref 0.57–1.00)
Globulin, Total: 2.4 g/dL (ref 1.5–4.5)
Glucose: 64 mg/dL — ABNORMAL LOW (ref 65–99)
Potassium: 4.2 mmol/L (ref 3.5–5.2)
Sodium: 142 mmol/L (ref 134–144)
Total Protein: 7.1 g/dL (ref 6.0–8.5)
eGFR: 59 mL/min/{1.73_m2} — ABNORMAL LOW (ref >59)

## 2020-11-04 LAB — LIPID PANEL W/O CHOL/HDL RATIO
Cholesterol, Total: 224 mg/dL — ABNORMAL HIGH (ref 100–199)
HDL: 45 mg/dL (ref >39)
LDL Chol Calc (NIH): 150 mg/dL — ABNORMAL HIGH (ref 0–99)
Triglycerides: 161 mg/dL — ABNORMAL HIGH (ref 0–149)
VLDL Cholesterol Cal: 29 mg/dL (ref 5–40)

## 2020-11-04 LAB — VITAMIN D 25 HYDROXY (VIT D DEFICIENCY, FRACTURES): Vit D, 25-Hydroxy: 33.4 ng/mL (ref 30.0–100.0)

## 2021-01-18 ENCOUNTER — Encounter: Payer: Self-pay | Admitting: Family Medicine

## 2021-01-18 ENCOUNTER — Other Ambulatory Visit: Payer: Self-pay

## 2021-01-18 ENCOUNTER — Ambulatory Visit: Payer: BC Managed Care – PPO | Admitting: Family Medicine

## 2021-01-18 VITALS — BP 141/80 | HR 94 | Ht 64.0 in | Wt 215.0 lb

## 2021-01-18 DIAGNOSIS — J0100 Acute maxillary sinusitis, unspecified: Secondary | ICD-10-CM

## 2021-01-18 DIAGNOSIS — R051 Acute cough: Secondary | ICD-10-CM | POA: Diagnosis not present

## 2021-01-18 LAB — VERITOR FLU A/B WAIVED
Influenza A: NEGATIVE
Influenza B: NEGATIVE

## 2021-01-18 MED ORDER — DOXYCYCLINE HYCLATE 100 MG PO TABS: 100.0000 mg | ORAL_TABLET | Freq: Two times a day (BID) | ORAL | 0 refills | Status: DC

## 2021-01-18 MED ORDER — PREDNISONE 50 MG PO TABS: 50.0000 mg | ORAL_TABLET | Freq: Every day | ORAL | 0 refills | Status: DC

## 2021-01-18 NOTE — Progress Notes (Signed)
BP (!) 141/80   Pulse 94   Ht 5\' 4"  (1.626 m)   Wt 215 lb (97.5 kg)   LMP  (LMP Unknown)   BMI 36.90 kg/m    Subjective:    Patient ID: Tracie Sheppard, female    DOB: 09/10/1955, 65 y.o.   MRN: [id number]  HPI: Tracie Sheppard is a 65 y.o. female  Chief Complaint  Patient presents with   Cough   UPPER RESPIRATORY TRACT INFECTION- covid negative Duration: 2+ weeks Worst symptom: congestion, cough Fever: no Cough: yes Shortness of breath: yes Wheezing: yes Chest pain: no Chest tightness: yes Chest congestion: yes Nasal congestion: yes Runny nose: yes Post nasal drip: yes Sneezing: no Sore throat: no Swollen glands: no Sinus pressure: yes Headache: yes Face pain: yes Toothache: yes Ear pain: yes bilateral Ear pressure: yes bilateral Eyes red/itching:no Eye drainage/crusting: no  Vomiting: no Rash: no Fatigue: yes Sick contacts: yes Strep contacts: no  Context: worse Recurrent sinusitis: no Relief with OTC cold/cough medications: no  Treatments attempted: cold/sinus, mucinex, pseudoephedrine, and cough syrup    Relevant past medical, surgical, family and social history reviewed and updated as indicated. Interim medical history since our last visit reviewed. Allergies and medications reviewed and updated.  Review of Systems  Constitutional:  Positive for chills, diaphoresis and fatigue. Negative for activity change, appetite change, fever and unexpected weight change.  HENT:  Positive for congestion, postnasal drip, sinus pressure and sinus pain. Negative for dental problem, drooling, ear discharge, ear pain, facial swelling, hearing loss, mouth sores, nosebleeds, rhinorrhea, sneezing, sore throat, tinnitus, trouble swallowing and voice change.   Eyes: Negative.   Respiratory:  Positive for cough, chest tightness and shortness of breath. Negative for apnea, choking, wheezing and stridor.   Cardiovascular: Negative.   Gastrointestinal: Negative.    Musculoskeletal: Negative.   Psychiatric/Behavioral: Negative.     Per HPI unless specifically indicated above     Objective:    BP (!) 141/80   Pulse 94   Ht 5\' 4"  (1.626 m)   Wt 215 lb (97.5 kg)   LMP  (LMP Unknown)   BMI 36.90 kg/m   Wt Readings from Last 3 Encounters:  01/18/21 215 lb (97.5 kg)  11/03/20 215 lb (97.5 kg)  08/28/20 213 lb (96.6 kg)    Physical Exam Vitals and nursing note reviewed.  Constitutional:      General: She is not in acute distress.    Appearance: Normal appearance. She is obese. She is ill-appearing. She is not toxic-appearing or diaphoretic.  HENT:     Head: Normocephalic and atraumatic.     Right Ear: External ear normal.     Left Ear: External ear normal.     Nose: Nose normal.     Mouth/Throat:     Mouth: Mucous membranes are moist.     Pharynx: Oropharynx is clear.  Eyes:     General: No scleral icterus.       Right eye: No discharge.        Left eye: No discharge.     Extraocular Movements: Extraocular movements intact.     Conjunctiva/sclera: Conjunctivae normal.     Pupils: Pupils are equal, round, and reactive to light.  Cardiovascular:     Rate and Rhythm: Normal rate and regular rhythm.     Pulses: Normal pulses.     Heart sounds: Normal heart sounds. No murmur heard.   No friction rub. No gallop.  Pulmonary:  Effort: Pulmonary effort is normal. No respiratory distress.     Breath sounds: Normal breath sounds. No stridor. No wheezing, rhonchi or rales.  Chest:     Chest wall: No tenderness.  Musculoskeletal:        General: Normal range of motion.     Cervical back: Normal range of motion and neck supple.  Skin:    General: Skin is warm and dry.     Capillary Refill: Capillary refill takes less than 2 seconds.     Coloration: Skin is not jaundiced or pale.     Findings: No bruising, erythema, lesion or rash.  Neurological:     General: No focal deficit present.     Mental Status: She is alert and oriented to  person, place, and time. Mental status is at baseline.  Psychiatric:        Mood and Affect: Mood normal.        Behavior: Behavior normal.        Thought Content: Thought content normal.        Judgment: Judgment normal.    Results for orders placed or performed in visit on 11/04/20  HM MAMMOGRAPHY  Result Value Ref Range   HM Mammogram 0-4 Bi-Rad 0-4 Bi-Rad, Self Reported Normal      Assessment & Plan:   Problem List Items Addressed This Visit   None Visit Diagnoses     Acute non-recurrent maxillary sinusitis    -  Primary   Will treat with prednisone and doxycycline. Await results. Treat as needed.    Relevant Medications   doxycycline (VIBRA-TABS) 100 MG tablet   predniSONE (DELTASONE) 50 MG tablet   Acute cough       Will swab for Flu and Covid. Await results.    Relevant Orders   Veritor Flu A/B Waived   Novel Coronavirus, NAA ([facility])        Follow up plan: Return if symptoms worsen or fail to improve.

## 2021-01-19 LAB — SARS-COV-2, NAA 2 DAY TAT

## 2021-01-19 LAB — NOVEL CORONAVIRUS, NAA: SARS-CoV-2, NAA: NOT DETECTED

## 2021-01-21 ENCOUNTER — Encounter: Payer: Self-pay | Admitting: Family Medicine

## 2021-01-21 ENCOUNTER — Other Ambulatory Visit: Payer: Self-pay

## 2021-01-21 ENCOUNTER — Ambulatory Visit: Payer: Self-pay

## 2021-01-21 ENCOUNTER — Ambulatory Visit
Admission: RE | Admit: 2021-01-21 | Discharge: 2021-01-21 | Disposition: A | Discharge status: Home / Self Care | Payer: BC Managed Care – PPO | Source: Ambulatory Visit | Attending: Family Medicine | Admitting: Family Medicine

## 2021-01-21 ENCOUNTER — Ambulatory Visit
Admission: RE | Admit: 2021-01-21 | Discharge: 2021-01-21 | Disposition: A | Discharge status: Home / Self Care | Payer: BC Managed Care – PPO | Attending: Family Medicine | Admitting: Family Medicine

## 2021-01-21 ENCOUNTER — Ambulatory Visit: Payer: BC Managed Care – PPO | Admitting: Family Medicine

## 2021-01-21 VITALS — BP 151/86 | HR 67 | Ht 64.0 in | Wt 215.0 lb

## 2021-01-21 DIAGNOSIS — R051 Acute cough: Secondary | ICD-10-CM | POA: Insufficient documentation

## 2021-01-21 MED ORDER — BENZONATATE 200 MG PO CAPS: 200.0000 mg | ORAL_CAPSULE | Freq: Two times a day (BID) | ORAL | 0 refills | Status: DC | PRN

## 2021-01-21 MED ORDER — HYDROCOD POLST-CPM POLST ER 10-8 MG/5ML PO SUER: 5.0000 mL | Freq: Two times a day (BID) | ORAL | 0 refills | Status: DC | PRN

## 2021-01-21 NOTE — Telephone Encounter (Signed)
Pt called in stating she has been having increased cough and SOB. She went to PCP on 01/18/21 and dx with cough and URI. Was prescribed albuterol inhaler, 50mg  prednisone x 5 days, and doxycycline 100mg  x 5 days. Pt started meds on 01/19/21 but SOB and cough has gotten worse to where cough is dry and hacky and hard to catch breath during and after cough and causing incontinence. Advised pt to schedule appt and f/up with PCP d/t symptoms aren't improving. Scheduled appt today at 1340 with Dr. Wynetta Emery. Pt asked if I felt she would be okay waiting for tomorrow appt, I advised pt she needed to be seen today. She agreed to go to appt this evening. Care advice was given and pt verbalized understanding.    Reason for Disposition  Continuous (nonstop) coughing interferes with work, school, or sleeping  Answer Assessment - Initial Assessment Questions 1. RESPIRATORY STATUS: "Describe your breathing?" (e.g., wheezing, shortness of breath, unable to speak, severe coughing)      SOB, cough dry hacky, lightheaded, cough to where its hard to catch breath 2. ONSET: "When did this breathing problem begin?"      More than a week ago 3. PATTERN "Does the difficult breathing come and go, or has it been constant since it started?"      Cough but unable to catch breathe  4. SEVERITY: "How bad is your breathing?" (e.g., mild, moderate, severe)    - MILD: No SOB at rest, mild SOB with walking, speaks normally in sentences, can lie down, no retractions, pulse < 100.    - MODERATE: SOB at rest, SOB with minimal exertion and prefers to sit, cannot lie down flat, speaks in phrases, mild retractions, audible wheezing, pulse 100-120.    - SEVERE: Very SOB at rest, speaks in single words, struggling to breathe, sitting hunched forward, retractions, pulse > 120      Only when coughing moderate 5. RECURRENT SYMPTOM: "Have you had difficulty breathing before?" If Yes, ask: "When was the last time?" and "What happened that time?"       No 6. CARDIAC HISTORY: "Do you have any history of heart disease?" (e.g., heart attack, angina, bypass surgery, angioplasty)      no 7. LUNG HISTORY: "Do you have any history of lung disease?"  (e.g., pulmonary embolus, asthma, emphysema)     No 8. CAUSE: "What do you think is causing the breathing problem?"      unsure 9. OTHER SYMPTOMS: "Do you have any other symptoms? (e.g., dizziness, runny nose, cough, chest pain, fever)     Wheezing, dizziness, cough, chest discomfort 10. O2 SATURATION MONITOR:  "Do you use an oxygen saturation monitor (pulse oximeter) at home?" If Yes, "What is your reading (oxygen level) today?" "What is your usual oxygen saturation reading?" (e.g., 95%)       No 11. PREGNANCY: "Is there any chance you are pregnant?" "When was your last menstrual period?"       NO 12. TRAVEL: "Have you traveled out of the country in the last month?" (e.g., travel history, exposures)       No  Protocols used: Breathing Difficulty-A-AH

## 2021-01-21 NOTE — Progress Notes (Signed)
BP (!) 151/86   Pulse 67   Ht 5\' 4"  (1.626 m)   Wt 215 lb (97.5 kg)   LMP  (LMP Unknown)   SpO2 98%   BMI 36.90 kg/m    Subjective:    Patient ID: Tracie Sheppard, female    DOB: November 19, 1955, 65 y.o.   MRN: [id number]  HPI: Tracie Sheppard is a 65 y.o. female  Chief Complaint  Patient presents with   Cough   UPPER RESPIRATORY TRACT INFECTION Duration: 2+ weeks Worst symptom: cough Fever: no Cough: yes Shortness of breath: yes Wheezing: yes Chest pain: no Chest tightness: yes Chest congestion: yes Nasal congestion: yes Runny nose: yes Post nasal drip: yes Sneezing: no Sore throat: no Swollen glands: no Sinus pressure: yes Headache: yes Face pain: no Toothache: yes Ear pain: yes bilateral Ear pressure: yes bilateral Eyes red/itching:no Eye drainage/crusting: no  Vomiting: no Rash: no Fatigue: yes Sick contacts: yes Strep contacts: no  Context: stable Recurrent sinusitis: no Relief with OTC cold/cough medications: no  Treatments attempted: cold/sinus, mucinex, anti-histamine, pseudoephedrine, cough syrup, and antibiotics   Relevant past medical, surgical, family and social history reviewed and updated as indicated. Interim medical history since our last visit reviewed. Allergies and medications reviewed and updated.  Review of Systems  Constitutional:  Positive for fatigue. Negative for activity change, appetite change, chills, diaphoresis, fever and unexpected weight change.  HENT:  Positive for congestion, postnasal drip, rhinorrhea, sinus pressure and sore throat. Negative for dental problem, drooling, ear discharge, ear pain, facial swelling, hearing loss, mouth sores, nosebleeds, sinus pain, sneezing, tinnitus, trouble swallowing and voice change.   Eyes: Negative.   Respiratory:  Positive for cough, chest tightness, shortness of breath and wheezing. Negative for apnea, choking and stridor.   Cardiovascular: Negative.   Gastrointestinal: Negative.    Musculoskeletal: Negative.   Neurological: Negative.   Psychiatric/Behavioral: Negative.     Per HPI unless specifically indicated above     Objective:    BP (!) 151/86   Pulse 67   Ht 5\' 4"  (1.626 m)   Wt 215 lb (97.5 kg)   LMP  (LMP Unknown)   SpO2 98%   BMI 36.90 kg/m   Wt Readings from Last 3 Encounters:  01/21/21 215 lb (97.5 kg)  01/18/21 215 lb (97.5 kg)  11/03/20 215 lb (97.5 kg)    Physical Exam Vitals and nursing note reviewed.  Constitutional:      General: She is not in acute distress.    Appearance: Normal appearance. She is not ill-appearing, toxic-appearing or diaphoretic.  HENT:     Head: Normocephalic and atraumatic.     Right Ear: Tympanic membrane, ear canal and external ear normal. There is no impacted cerumen.     Left Ear: Tympanic membrane, ear canal and external ear normal. There is no impacted cerumen.     Nose: Congestion and rhinorrhea present.     Mouth/Throat:     Mouth: Mucous membranes are moist.     Pharynx: Oropharynx is clear. No oropharyngeal exudate or posterior oropharyngeal erythema.  Eyes:     General: No scleral icterus.       Right eye: No discharge.        Left eye: No discharge.     Extraocular Movements: Extraocular movements intact.     Conjunctiva/sclera: Conjunctivae normal.     Pupils: Pupils are equal, round, and reactive to light.  Neck:     Vascular: No carotid bruit.  Cardiovascular:  Rate and Rhythm: Normal rate and regular rhythm.     Pulses: Normal pulses.     Heart sounds: Normal heart sounds. No murmur heard.   No friction rub. No gallop.  Pulmonary:     Effort: Pulmonary effort is normal. No respiratory distress.     Breath sounds: Normal breath sounds. No stridor. No wheezing, rhonchi or rales.  Chest:     Chest wall: No tenderness.  Musculoskeletal:        General: Normal range of motion.     Cervical back: Normal range of motion and neck supple. No rigidity. No muscular tenderness.   Lymphadenopathy:     Cervical: No cervical adenopathy.  Skin:    General: Skin is warm and dry.     Capillary Refill: Capillary refill takes less than 2 seconds.     Coloration: Skin is not jaundiced or pale.     Findings: No bruising, erythema, lesion or rash.  Neurological:     General: No focal deficit present.     Mental Status: She is alert and oriented to person, place, and time. Mental status is at baseline.     Cranial Nerves: No cranial nerve deficit.     Sensory: No sensory deficit.     Motor: No weakness.     Coordination: Coordination normal.     Gait: Gait normal.     Deep Tendon Reflexes: Reflexes normal.  Psychiatric:        Mood and Affect: Mood normal.        Behavior: Behavior normal.        Thought Content: Thought content normal.        Judgment: Judgment normal.    Results for orders placed or performed in visit on 01/18/21  Novel Coronavirus, NAA ([facility])   Specimen: Saline  Result Value Ref Range   SARS-CoV-2, NAA Not Detected Not Detected  SARS-COV-2, NAA 2 DAY TAT  Result Value Ref Range   SARS-CoV-2, NAA 2 DAY TAT Performed   Veritor Flu A/B Waived  Result Value Ref Range   Influenza A Negative Negative   Influenza B Negative Negative      Assessment & Plan:   Problem List Items Addressed This Visit   None Visit Diagnoses     Acute cough    -  Primary   Will continue current medicine. Will add tussionex and tessalon. Will go get CXR. Call if not better by next week.    Relevant Orders   DG Chest 2 View        Follow up plan: Return if symptoms worsen or fail to improve.

## 2021-02-08 ENCOUNTER — Encounter: Payer: Self-pay | Admitting: Family Medicine

## 2021-02-08 ENCOUNTER — Other Ambulatory Visit: Payer: Self-pay

## 2021-02-08 ENCOUNTER — Ambulatory Visit: Payer: Self-pay | Admitting: *Deleted

## 2021-02-08 ENCOUNTER — Ambulatory Visit (INDEPENDENT_AMBULATORY_CARE_PROVIDER_SITE_OTHER): Payer: BC Managed Care – PPO | Admitting: Family Medicine

## 2021-02-08 VITALS — BP 133/71 | HR 96 | Temp 98.4°F | Wt 210.8 lb

## 2021-02-08 DIAGNOSIS — R051 Acute cough: Secondary | ICD-10-CM

## 2021-02-08 MED ORDER — PREDNISONE 50 MG PO TABS: 50.0000 mg | ORAL_TABLET | Freq: Every day | ORAL | 0 refills | Status: DC

## 2021-02-08 MED ORDER — HYDROCOD POLST-CPM POLST ER 10-8 MG/5ML PO SUER
5.0000 mL | Freq: Two times a day (BID) | ORAL | 0 refills | Status: DC | PRN
Start: 2021-02-08 — End: 2021-05-20

## 2021-02-08 MED ORDER — BENZONATATE 200 MG PO CAPS: 200.0000 mg | ORAL_CAPSULE | Freq: Two times a day (BID) | ORAL | 0 refills | Status: DC | PRN

## 2021-02-08 NOTE — Progress Notes (Signed)
BP 133/71   Pulse 96   Temp 98.4 F (36.9 C)   Wt 210 lb 12.8 oz (95.6 kg)   LMP  (LMP Unknown)   SpO2 98%   BMI 36.18 kg/m    Subjective:    Patient ID: Tracie Sheppard, female    DOB: 1955/10/27, 65 y.o.   MRN: [id number]  HPI: Tracie Sheppard is a 65 y.o. female  Chief Complaint  Patient presents with   Cough    Patient states cough persist, was getting better but has started coughing again. Patient states she has congestion as well, is coughing up green mucus    UPPER RESPIRATORY TRACT INFECTION Duration: was getting significantly better, then started feeling worse again about 4 days ago Worst symptom: cough Fever: yes Cough: yes Shortness of breath: yes Wheezing: no Chest pain: yes, with cough Chest tightness: yes Chest congestion: yes Nasal congestion: yes Runny nose: yes Post nasal drip: yes Sneezing: no Sore throat: yes Swollen glands: no Sinus pressure: yes Headache: yes Face pain: no Toothache: yes Ear pain: no  Ear pressure: yes bilateral Eyes red/itching:no Eye drainage/crusting: no  Vomiting: no Rash: no Fatigue: yes Sick contacts: yes Strep contacts: no  Context: worse Recurrent sinusitis: no Relief with OTC cold/cough medications: no  Treatments attempted: none   Relevant past medical, surgical, family and social history reviewed and updated as indicated. Interim medical history since our last visit reviewed. Allergies and medications reviewed and updated.  Review of Systems  Constitutional:  Positive for chills, diaphoresis, fatigue and fever. Negative for activity change, appetite change and unexpected weight change.  HENT:  Positive for congestion, postnasal drip, rhinorrhea and sore throat. Negative for dental problem, drooling, ear discharge, ear pain, facial swelling, hearing loss, mouth sores, nosebleeds, sinus pressure, sinus pain, sneezing, tinnitus, trouble swallowing and voice change.   Eyes: Negative.   Respiratory:  Positive  for cough and chest tightness. Negative for apnea, choking, shortness of breath, wheezing and stridor.   Cardiovascular: Negative.   Gastrointestinal: Negative.   Musculoskeletal: Negative.   Neurological: Negative.   Psychiatric/Behavioral: Negative.     Per HPI unless specifically indicated above     Objective:    BP 133/71   Pulse 96   Temp 98.4 F (36.9 C)   Wt 210 lb 12.8 oz (95.6 kg)   LMP  (LMP Unknown)   SpO2 98%   BMI 36.18 kg/m   Wt Readings from Last 3 Encounters:  02/08/21 210 lb 12.8 oz (95.6 kg)  01/21/21 215 lb (97.5 kg)  01/18/21 215 lb (97.5 kg)    Physical Exam Vitals and nursing note reviewed.  Constitutional:      General: She is not in acute distress.    Appearance: Normal appearance. She is not ill-appearing, toxic-appearing or diaphoretic.  HENT:     Head: Normocephalic and atraumatic.     Right Ear: Tympanic membrane, ear canal and external ear normal. There is no impacted cerumen.     Left Ear: Tympanic membrane, ear canal and external ear normal. There is no impacted cerumen.     Nose: No congestion or rhinorrhea.     Mouth/Throat:     Mouth: Mucous membranes are moist.     Pharynx: Oropharynx is clear. No oropharyngeal exudate or posterior oropharyngeal erythema.  Eyes:     General: No scleral icterus.       Right eye: No discharge.        Left eye: No discharge.  Extraocular Movements: Extraocular movements intact.     Conjunctiva/sclera: Conjunctivae normal.     Pupils: Pupils are equal, round, and reactive to light.  Neck:     Vascular: No carotid bruit.  Cardiovascular:     Rate and Rhythm: Normal rate and regular rhythm.     Pulses: Normal pulses.     Heart sounds: Normal heart sounds. No murmur heard.   No friction rub. No gallop.  Pulmonary:     Effort: Pulmonary effort is normal. No respiratory distress.     Breath sounds: Normal breath sounds. No stridor. No wheezing, rhonchi or rales.  Chest:     Chest wall: No  tenderness.  Musculoskeletal:        General: Normal range of motion.     Cervical back: Normal range of motion and neck supple. No rigidity. No muscular tenderness.  Lymphadenopathy:     Cervical: Cervical adenopathy present.  Skin:    General: Skin is warm and dry.     Capillary Refill: Capillary refill takes less than 2 seconds.     Coloration: Skin is not jaundiced or pale.     Findings: No bruising, erythema, lesion or rash.  Neurological:     General: No focal deficit present.     Mental Status: She is alert and oriented to person, place, and time. Mental status is at baseline.     Cranial Nerves: No cranial nerve deficit.     Sensory: No sensory deficit.     Motor: No weakness.     Coordination: Coordination normal.     Gait: Gait normal.     Deep Tendon Reflexes: Reflexes normal.  Psychiatric:        Mood and Affect: Mood normal.        Behavior: Behavior normal.        Thought Content: Thought content normal.        Judgment: Judgment normal.    Results for orders placed or performed in visit on 01/18/21  Novel Coronavirus, NAA ([facility])   Specimen: Saline  Result Value Ref Range   SARS-CoV-2, NAA Not Detected Not Detected  SARS-COV-2, NAA 2 DAY TAT  Result Value Ref Range   SARS-CoV-2, NAA 2 DAY TAT Performed   Veritor Flu A/B Waived  Result Value Ref Range   Influenza A Negative Negative   Influenza B Negative Negative      Assessment & Plan:   Problem List Items Addressed This Visit   None Visit Diagnoses     Acute cough    -  Primary   Flu and strep negative. Await covid. Continue symptomatic care. Call if not getting better or getting worse.    Relevant Orders   Veritor Flu A/B Waived   Novel Coronavirus, NAA ([facility])   Rapid Strep screen([facility])        Follow up plan: Return if symptoms worsen or fail to improve.

## 2021-02-08 NOTE — Telephone Encounter (Signed)
Reason for Disposition  Fever present > 3 days (72 hours)  Answer Assessment - Initial Assessment Questions 1. LOCATION: "Where does it hurt?"      Forehead pain-cough and sneezing 2. ONSET: "When did the sinus pain start?"  (e.g., hours, days)      1 week 3. SEVERITY: "How bad is the pain?"   (Scale 1-10; mild, moderate or severe)   - MILD (1-3): doesn't interfere with normal activities    - MODERATE (4-7): interferes with normal activities (e.g., work or school) or awakens from sleep   - SEVERE (8-10): excruciating pain and patient unable to do any normal activities        Body aches, head pain 4. RECURRENT SYMPTOM: "Have you ever had sinus problems before?" If Yes, ask: "When was the last time?" and "What happened that time?"      Yes- treated with antibiotics and steriod 5. NASAL CONGESTION: "Is the nose blocked?" If Yes, ask: "Can you open it or must you breathe through your mouth?"     Yes- using saline- is breathing mouth 6. NASAL DISCHARGE: "Do you have discharge from your nose?" If so ask, "What color?"     Yellow-thick 7. FEVER: "Do you have a fever?" If Yes, ask: "What is it, how was it measured, and when did it start?"      Low grade- 99- checked Saturday 8. OTHER SYMPTOMS: "Do you have any other symptoms?" (e.g., sore throat, cough, earache, difficulty breathing)     Head pain, foggy, fatigued 9. PREGNANCY: "Is there any chance you are pregnant?" "When was your last menstrual period?"     na  Protocols used: Sinus Pain or Congestion-A-AH

## 2021-02-08 NOTE — Telephone Encounter (Signed)
Patient is calling to report she is not better- her symptoms are getting worse- cough, congestion, fever, body aches, head pain. CRM was sent to office- and triage call for information for provider- patient received call from office and has been scheduled for appointment today.

## 2021-02-09 LAB — SARS-COV-2, NAA 2 DAY TAT

## 2021-02-09 LAB — NOVEL CORONAVIRUS, NAA: SARS-CoV-2, NAA: NOT DETECTED

## 2021-02-12 LAB — VERITOR FLU A/B WAIVED
Influenza A: NEGATIVE
Influenza B: NEGATIVE

## 2021-02-12 LAB — CULTURE, GROUP A STREP: Strep A Culture: NEGATIVE

## 2021-02-12 LAB — RAPID STREP SCREEN (MED CTR MEBANE ONLY): Strep Gp A Ag, IA W/Reflex: NEGATIVE

## 2021-02-16 ENCOUNTER — Ambulatory Visit: Payer: BC Managed Care – PPO | Admitting: Family Medicine

## 2021-03-02 ENCOUNTER — Encounter: Payer: Self-pay | Admitting: Family Medicine

## 2021-03-04 NOTE — Telephone Encounter (Signed)
appt

## 2021-03-05 NOTE — Telephone Encounter (Signed)
Pt stated that she is actually feeling better and will wait to see how she feels over the weekend. If needed she will make an appt next week.

## 2021-05-06 ENCOUNTER — Encounter: Payer: BC Managed Care – PPO | Admitting: Family Medicine

## 2021-05-20 ENCOUNTER — Other Ambulatory Visit: Payer: Self-pay

## 2021-05-20 ENCOUNTER — Encounter: Payer: Self-pay | Admitting: Family Medicine

## 2021-05-20 ENCOUNTER — Ambulatory Visit (INDEPENDENT_AMBULATORY_CARE_PROVIDER_SITE_OTHER): Payer: BC Managed Care – PPO | Admitting: Family Medicine

## 2021-05-20 VITALS — BP 137/86 | HR 75 | Temp 98.1°F | Wt 213.4 lb

## 2021-05-20 DIAGNOSIS — E079 Disorder of thyroid, unspecified: Secondary | ICD-10-CM | POA: Diagnosis not present

## 2021-05-20 DIAGNOSIS — Z1231 Encounter for screening mammogram for malignant neoplasm of breast: Secondary | ICD-10-CM

## 2021-05-20 DIAGNOSIS — Z0000 Encounter for general adult medical examination without abnormal findings: Secondary | ICD-10-CM | POA: Diagnosis not present

## 2021-05-20 DIAGNOSIS — E782 Mixed hyperlipidemia: Secondary | ICD-10-CM

## 2021-05-20 DIAGNOSIS — I10 Essential (primary) hypertension: Secondary | ICD-10-CM | POA: Diagnosis not present

## 2021-05-20 DIAGNOSIS — F331 Major depressive disorder, recurrent, moderate: Secondary | ICD-10-CM

## 2021-05-20 DIAGNOSIS — R7301 Impaired fasting glucose: Secondary | ICD-10-CM

## 2021-05-20 LAB — MICROALBUMIN, URINE WAIVED
Creatinine, Urine Waived: 300 mg/dL (ref 10–300)
Microalb, Ur Waived: 30 mg/L — ABNORMAL HIGH (ref 0–19)
Microalb/Creat Ratio: <30 mg/g (ref <30)

## 2021-05-20 LAB — URINALYSIS, ROUTINE W REFLEX MICROSCOPIC
Bilirubin, UA: NEGATIVE
Glucose, UA: NEGATIVE
Ketones, UA: NEGATIVE
Leukocytes,UA: NEGATIVE
Nitrite, UA: NEGATIVE
Protein,UA: NEGATIVE
Specific Gravity, UA: 1.025 (ref 1.005–1.030)
Urobilinogen, Ur: 0.2 mg/dL (ref 0.2–1.0)
pH, UA: 6 (ref 5.0–7.5)

## 2021-05-20 LAB — MICROSCOPIC EXAMINATION
Bacteria, UA: NONE SEEN
Epithelial Cells (non renal): NONE SEEN /hpf (ref 0–10)
WBC, UA: NONE SEEN /hpf (ref 0–5)

## 2021-05-20 MED ORDER — ALBUTEROL SULFATE HFA 108 (90 BASE) MCG/ACT IN AERS: 2.0000 | INHALATION_SPRAY | Freq: Four times a day (QID) | RESPIRATORY_TRACT | 3 refills | Status: DC | PRN

## 2021-05-20 MED ORDER — BUPROPION HCL ER (XL) 300 MG PO TB24: 300.0000 mg | ORAL_TABLET | Freq: Every day | ORAL | 1 refills | Status: DC

## 2021-05-20 MED ORDER — CETIRIZINE HCL 10 MG PO TABS: 10.0000 mg | ORAL_TABLET | Freq: Every day | ORAL | 3 refills | Status: DC | PRN

## 2021-05-20 MED ORDER — CITALOPRAM HYDROBROMIDE 40 MG PO TABS: 60.0000 mg | ORAL_TABLET | Freq: Every day | ORAL | 1 refills | Status: DC

## 2021-05-20 MED ORDER — CLONAZEPAM 0.5 MG PO TABS: ORAL_TABLET | ORAL | 0 refills | Status: DC

## 2021-05-20 MED ORDER — SPIRONOLACTONE 100 MG PO TABS: 100.0000 mg | ORAL_TABLET | Freq: Two times a day (BID) | ORAL | 1 refills | Status: DC

## 2021-05-20 MED ORDER — FLUTICASONE PROPIONATE 50 MCG/ACT NA SUSP: NASAL | 12 refills | Status: DC

## 2021-05-20 NOTE — Progress Notes (Signed)
BP 137/86    Pulse 75    Temp 98.1 F (36.7 C)    Wt 213 lb 6.4 oz (96.8 kg)    LMP  (LMP Unknown)    SpO2 97%    BMI 36.63 kg/m    Subjective:    Patient ID: Tracie Sheppard, female    DOB: 1955-05-10, 66 y.o.   MRN: [id number]  HPI: Tracie Sheppard is a 66 y.o. female presenting on 05/20/2021 for comprehensive medical examination. Current medical complaints include:  HYPERTENSION / HYPERLIPIDEMIA Satisfied with current treatment? yes Duration of hypertension: chronic BP monitoring frequency: not checking BP medication side effects: no Past BP meds: spironalactone, propranolol Duration of hyperlipidemia: chronic Cholesterol medication side effects: not on anything Cholesterol supplements: none Past cholesterol medications: none Medication compliance: excellent compliance Aspirin: no Recent stressors: yes Recurrent headaches: no Visual changes: no Palpitations: no Dyspnea: no Chest pain: no Lower extremity edema: no Dizzy/lightheaded: no  DEPRESSION- has only been taking 40mg  of her celexa Mood status: uncontrolled Satisfied with current treatment?: no Symptom severity: moderate  Duration of current treatment : chronic Side effects: no Medication compliance: good compliance Psychotherapy/counseling: no  Previous psychiatric medications: celexa, wellbutrin Depressed mood: yes Anxious mood: yes Anhedonia: no Significant weight loss or gain: no Insomnia: no  Fatigue: yes Feelings of worthlessness or guilt: no Impaired concentration/indecisiveness: no Suicidal ideations: no Hopelessness: no Crying spells: no Depression screen [facility] 2/9 05/20/2021 11/03/2020 08/28/2020 04/09/2020 02/21/2020  Decreased Interest 1 1 1 1  0  Down, Depressed, Hopeless 1 1 0 0 1  PHQ - 2 Score 2 2 1 1 1   Altered sleeping 3 3 3 3 3   Tired, decreased energy 2 2 1 2 1   Change in appetite 3 3 3 1 3   Feeling bad or failure about yourself  1 0 0 1 0  Trouble concentrating 0 0 1 0 0  Moving slowly  or fidgety/restless 0 0 0 0 0  Suicidal thoughts 0 0 0 0 0  PHQ-9 Score 11 10 9 8 8   Difficult doing work/chores - Not difficult at all Not difficult at all Somewhat difficult Somewhat difficult  Some recent data might be hidden   She currently lives with: husband Menopausal Symptoms: no  Depression Screen done today and results listed below:  Depression screen [facility] 2/9 05/20/2021 11/03/2020 08/28/2020 04/09/2020 02/21/2020  Decreased Interest 1 1 1 1  0  Down, Depressed, Hopeless 1 1 0 0 1  PHQ - 2 Score 2 2 1 1 1   Altered sleeping 3 3 3 3 3   Tired, decreased energy 2 2 1 2 1   Change in appetite 3 3 3 1 3   Feeling bad or failure about yourself  1 0 0 1 0  Trouble concentrating 0 0 1 0 0  Moving slowly or fidgety/restless 0 0 0 0 0  Suicidal thoughts 0 0 0 0 0  PHQ-9 Score 11 10 9 8 8   Difficult doing work/chores - Not difficult at all Not difficult at all Somewhat difficult Somewhat difficult  Some recent data might be hidden    Past Medical History:  Past Medical History:  Diagnosis Date   Allergy    Seasonal   Anxiety    Bilateral carpal tunnel syndrome 05/30/2017   Chronic back pain    Depression, major, recurrent, moderate (HCC)    Diabetes mellitus without complication (HCC)    Prediabetic   High serum testosterone    HTN (hypertension) 09/12/2019   Kidney stone  Sleep apnea    Thyroid disease    did not show up in lab work but per other symptoms previous doctor started her on this    Surgical History:  Past Surgical History:  Procedure Laterality Date   CHOLECYSTECTOMY     1997   COLONOSCOPY WITH PROPOFOL N/A 11/16/2018   Procedure: COLONOSCOPY WITH PROPOFOL;  Surgeon: Lin Landsman, MD;  Location: [facility] ENDOSCOPY;  Service: Gastroenterology;  Laterality: N/A;   ESOPHAGOGASTRODUODENOSCOPY (EGD) WITH PROPOFOL N/A 11/16/2018   Procedure: ESOPHAGOGASTRODUODENOSCOPY (EGD) WITH PROPOFOL;  Surgeon: Lin Landsman, MD;  Location: [facility] ENDOSCOPY;  Service:  Gastroenterology;  Laterality: N/A;   NASAL SEPTUM SURGERY     OOPHORECTOMY     TUBAL LIGATION     1989    Medications:  Current Outpatient Medications on File Prior to Visit  Medication Sig   acetaminophen (TYLENOL) 500 MG tablet Tylenol Extra Strength 500 mg tablet   Alpha-Lipoic Acid 600 MG CAPS Take 600 mg by mouth daily.   Cyanocobalamin 1000 MCG TBCR Take by mouth.   ibuprofen (ADVIL) 200 MG tablet Take 200 mg by mouth as needed.    propranolol ER (INDERAL LA) 60 MG 24 hr capsule Take 1 tablet by mouth daily.   VITAMIN D PO Take by mouth daily. 1000 mcg   naproxen (NAPROSYN) 500 MG tablet Take 1 tablet (500 mg total) by mouth 2 (two) times daily with a meal. (Patient not taking: Reported on 05/20/2021)   No current facility-administered medications on file prior to visit.    Allergies:  Allergies  Allergen Reactions   Aleve [Naproxen Sodium] Other (See Comments)    Chest pain   Statins Other (See Comments)    myalgias    Social History:  Social History   Socioeconomic History   Marital status: Married    Spouse name: Not on file   Number of children: 1   Years of education: 14   Highest education level: Not on file  Occupational History   Occupation: unemployed  Tobacco Use   Smoking status: Never   Smokeless tobacco: Never  Vaping Use   Vaping Use: Never used  Substance and Sexual Activity   Alcohol use: Yes    Comment: On occasion   Drug use: No   Sexual activity: Yes    Birth control/protection: Post-menopausal, None  Other Topics Concern   Not on file  Social History Narrative   Lives with spouse   Caffeine use: 1-2 drinks per day   Right handed    Social Determinants of Health   Financial Resource Strain: Not on file  Food Insecurity: Not on file  Transportation Needs: Not on file  Physical Activity: Not on file  Stress: Not on file  Social Connections: Not on file  Intimate Partner Violence: Not on file   Social History   Tobacco Use   Smoking Status Never  Smokeless Tobacco Never   Social History   Substance and Sexual Activity  Alcohol Use Yes   Comment: On occasion    Family History:  Family History  Problem Relation Age of Onset   Diabetes Mother    Anemia Mother    Hyperlipidemia Mother    Arthritis Mother    Hypertension Father    Cancer Father    Celiac disease Sister    Diabetes Maternal Grandmother    Stroke Maternal Grandmother    Heart attack Maternal Grandmother    Heart disease Paternal Grandfather     Past medical  history, surgical history, medications, allergies, family history and social history reviewed with patient today and changes made to appropriate areas of the chart.   Review of Systems  Constitutional: Negative.   HENT: Negative.    Eyes:  Positive for blurred vision. Negative for double vision, photophobia, pain, discharge and redness.  Respiratory: Negative.    Cardiovascular: Negative.   Gastrointestinal:  Positive for constipation, diarrhea, heartburn and nausea. Negative for abdominal pain, blood in stool, melena and vomiting.  Genitourinary:  Positive for urgency. Negative for dysuria, flank pain, frequency and hematuria.       +stress incontinence  Musculoskeletal:  Positive for joint pain (R joint pain) and myalgias. Negative for back pain, falls and neck pain.  Skin: Negative.   Neurological:  Positive for tingling. Negative for dizziness, tremors, sensory change, speech change, focal weakness, seizures, loss of consciousness, weakness and headaches.  Endo/Heme/Allergies: Negative.   Psychiatric/Behavioral:  Positive for depression. Negative for hallucinations, memory loss, substance abuse and suicidal ideas. The patient is nervous/anxious. The patient does not have insomnia.   All other ROS negative except what is listed above and in the HPI.      Objective:    BP 137/86    Pulse 75    Temp 98.1 F (36.7 C)    Wt 213 lb 6.4 oz (96.8 kg)    LMP  (LMP Unknown)     SpO2 97%    BMI 36.63 kg/m   Wt Readings from Last 3 Encounters:  05/20/21 213 lb 6.4 oz (96.8 kg)  02/08/21 210 lb 12.8 oz (95.6 kg)  01/21/21 215 lb (97.5 kg)    Physical Exam Vitals and nursing note reviewed.  Constitutional:      General: She is not in acute distress.    Appearance: Normal appearance. She is obese. She is not ill-appearing, toxic-appearing or diaphoretic.  HENT:     Head: Normocephalic and atraumatic.     Right Ear: Tympanic membrane, ear canal and external ear normal. There is no impacted cerumen.     Left Ear: Tympanic membrane, ear canal and external ear normal. There is no impacted cerumen.     Nose: Nose normal. No congestion or rhinorrhea.     Mouth/Throat:     Mouth: Mucous membranes are moist.     Pharynx: Oropharynx is clear. No oropharyngeal exudate or posterior oropharyngeal erythema.  Eyes:     General: No scleral icterus.       Right eye: No discharge.        Left eye: No discharge.     Extraocular Movements: Extraocular movements intact.     Conjunctiva/sclera: Conjunctivae normal.     Pupils: Pupils are equal, round, and reactive to light.  Neck:     Vascular: No carotid bruit.  Cardiovascular:     Rate and Rhythm: Normal rate and regular rhythm.     Pulses: Normal pulses.     Heart sounds: No murmur heard.   No friction rub. No gallop.  Pulmonary:     Effort: Pulmonary effort is normal. No respiratory distress.     Breath sounds: Normal breath sounds. No stridor. No wheezing, rhonchi or rales.  Chest:     Chest wall: No tenderness.  Abdominal:     General: Abdomen is flat. Bowel sounds are normal. There is no distension.     Palpations: Abdomen is soft. There is no mass.     Tenderness: There is no abdominal tenderness. There is no right CVA tenderness,  left CVA tenderness, guarding or rebound.     Hernia: No hernia is present.  Genitourinary:    Comments: Breast and pelvic exams deferred with shared decision making Musculoskeletal:         General: No swelling, tenderness, deformity or signs of injury.     Cervical back: Normal range of motion and neck supple. No rigidity. No muscular tenderness.     Right lower leg: No edema.     Left lower leg: No edema.  Lymphadenopathy:     Cervical: No cervical adenopathy.  Skin:    General: Skin is warm and dry.     Capillary Refill: Capillary refill takes less than 2 seconds.     Coloration: Skin is not jaundiced or pale.     Findings: No bruising, erythema, lesion or rash.  Neurological:     General: No focal deficit present.     Mental Status: She is alert and oriented to person, place, and time. Mental status is at baseline.     Cranial Nerves: No cranial nerve deficit.     Sensory: No sensory deficit.     Motor: No weakness.     Coordination: Coordination normal.     Gait: Gait normal.     Deep Tendon Reflexes: Reflexes normal.  Psychiatric:        Mood and Affect: Mood normal.        Behavior: Behavior normal.        Thought Content: Thought content normal.        Judgment: Judgment normal.    Results for orders placed or performed in visit on 02/08/21  Novel Coronavirus, NAA ([facility])   Specimen: Saline  Result Value Ref Range   SARS-CoV-2, NAA Not Detected Not Detected  Rapid Strep Screen ([facility] ONLY)   Naso  Result Value Ref Range   Strep Gp A Ag, IA W/Reflex Negative Negative  Culture, Group A Strep   Naso  Result Value Ref Range   Strep A Culture Negative   SARS-COV-2, NAA 2 DAY TAT  Result Value Ref Range   SARS-CoV-2, NAA 2 DAY TAT Performed   Veritor Flu A/B Waived  Result Value Ref Range   Influenza A Negative Negative   Influenza B Negative Negative      Assessment & Plan:   Problem List Items Addressed This Visit       Cardiovascular and Mediastinum   HTN (hypertension)    Under good control on current regimen. Continue current regimen. Continue to monitor. Call with any concerns. Refills given. Labs drawn today.        Relevant Medications   propranolol ER (INDERAL LA) 60 MG 24 hr capsule   spironolactone (ALDACTONE) 100 MG tablet   Other Relevant Orders   CBC with Differential/Platelet   Comprehensive metabolic panel   Urinalysis, Routine w reflex microscopic   Microalbumin, Urine Waived     Endocrine   Thyroid disease    Rechecking labs today. Await results. Treat as needed.       Relevant Medications   propranolol ER (INDERAL LA) 60 MG 24 hr capsule   Other Relevant Orders   CBC with Differential/Platelet   Comprehensive metabolic panel   TSH   IFG (impaired fasting glucose)    Rechecking labs today. Await results. Treat as needed.       Relevant Orders   CBC with Differential/Platelet   Comprehensive metabolic panel   Urinalysis, Routine w reflex microscopic     Other  Hyperlipidemia    Under good control on current regimen. Continue current regimen. Continue to monitor. Call with any concerns. Refills given. Labs drawn today.       Relevant Medications   propranolol ER (INDERAL LA) 60 MG 24 hr capsule   spironolactone (ALDACTONE) 100 MG tablet   Other Relevant Orders   CBC with Differential/Platelet   Comprehensive metabolic panel   Lipid Panel w/o Chol/HDL Ratio   Depression, major, recurrent, moderate (HCC)    Has been a bit more weepy. Has only been taking 40mg  of his celexa. Will see if that improves her mood and if not consider restarting combipatch.      Relevant Medications   buPROPion (WELLBUTRIN XL) 300 MG 24 hr tablet   citalopram (CELEXA) 40 MG tablet   Other Relevant Orders   CBC with Differential/Platelet   Comprehensive metabolic panel   Other Visit Diagnoses     Routine general medical examination at a health care facility    -  Primary   Vaccines up to date. Screening labs checked today. Pap, mammo, colonoscopy up to date. Continue diet and exercise. Call with any concerns.    Encounter for screening mammogram for malignant neoplasm of breast       Due  in July. Ordered today.    Relevant Orders   MM 3D SCREEN BREAST BILATERAL        Follow up plan: Return in about 4 weeks (around 06/17/2021) for follow up mood.   LABORATORY TESTING:  - Pap smear: pap done  IMMUNIZATIONS:   - Tdap: Tetanus vaccination status reviewed: last tetanus booster within 10 years. - Influenza: Refused - Pneumovax: Not applicable - Prevnar: Not applicable - COVID: Up to date  SCREENING: -Mammogram: Up to date  - Colonoscopy: Up to date   PATIENT COUNSELING:   Advised to take 1 mg of folate supplement per day if capable of pregnancy.   Sexuality: Discussed sexually transmitted diseases, partner selection, use of condoms, avoidance of unintended pregnancy  and contraceptive alternatives.   Advised to avoid cigarette smoking.  I discussed with the patient that most people either abstain from alcohol or drink within safe limits (<=14/week and <=4 drinks/occasion for males, <=7/weeks and <= 3 drinks/occasion for females) and that the risk for alcohol disorders and other health effects rises proportionally with the number of drinks per week and how often a drinker exceeds daily limits.  Discussed cessation/primary prevention of drug use and availability of treatment for abuse.   Diet: Encouraged to adjust caloric intake to maintain  or achieve ideal body weight, to reduce intake of dietary saturated fat and total fat, to limit sodium intake by avoiding high sodium foods and not adding table salt, and to maintain adequate dietary potassium and calcium preferably from fresh fruits, vegetables, and low-fat dairy products.    stressed the importance of regular exercise  Injury prevention: Discussed safety belts, safety helmets, smoke detector, smoking near bedding or upholstery.   Dental health: Discussed importance of regular tooth brushing, flossing, and dental visits.    NEXT PREVENTATIVE PHYSICAL DUE IN 1 YEAR. Return in about 4 weeks (around 06/17/2021)  for follow up mood.

## 2021-05-20 NOTE — Assessment & Plan Note (Signed)
Has been a bit more weepy. Has only been taking 40mg  of his celexa. Will see if that improves her mood and if not consider restarting combipatch.

## 2021-05-20 NOTE — Patient Instructions (Signed)
Please call to schedule your mammogram and/or bone density: °[facility]  °Address: [address], [place], [place] [address]  °Phone: [phone] ° °

## 2021-05-20 NOTE — Assessment & Plan Note (Signed)
Under good control on current regimen. Continue current regimen. Continue to monitor. Call with any concerns. Refills given. Labs drawn today.   

## 2021-05-20 NOTE — Assessment & Plan Note (Signed)
Rechecking labs today. Await results. Treat as needed.  °

## 2021-05-21 ENCOUNTER — Telehealth: Payer: Self-pay | Admitting: Family Medicine

## 2021-05-21 LAB — COMPREHENSIVE METABOLIC PANEL
ALT: 54 IU/L — ABNORMAL HIGH (ref 0–32)
AST: 41 IU/L — ABNORMAL HIGH (ref 0–40)
Albumin/Globulin Ratio: 1.6 (ref 1.2–2.2)
Albumin: 4.6 g/dL (ref 3.8–4.8)
Alkaline Phosphatase: 91 IU/L (ref 44–121)
BUN/Creatinine Ratio: 16 (ref 12–28)
BUN: 15 mg/dL (ref 8–27)
Bilirubin Total: 0.8 mg/dL (ref 0.0–1.2)
CO2: 23 mmol/L (ref 20–29)
Calcium: 10.2 mg/dL (ref 8.7–10.3)
Chloride: 101 mmol/L (ref 96–106)
Creatinine, Ser: 0.96 mg/dL (ref 0.57–1.00)
Globulin, Total: 2.8 g/dL (ref 1.5–4.5)
Glucose: 86 mg/dL (ref 70–99)
Potassium: 4.4 mmol/L (ref 3.5–5.2)
Sodium: 140 mmol/L (ref 134–144)
Total Protein: 7.4 g/dL (ref 6.0–8.5)
eGFR: 66 mL/min/{1.73_m2} (ref >59)

## 2021-05-21 LAB — CBC WITH DIFFERENTIAL/PLATELET
Basophils Absolute: 0 10*3/uL (ref 0.0–0.2)
Basos: 1 %
EOS (ABSOLUTE): 0.1 10*3/uL (ref 0.0–0.4)
Eos: 1 %
Hematocrit: 41.5 % (ref 34.0–46.6)
Hemoglobin: 13.7 g/dL (ref 11.1–15.9)
Immature Grans (Abs): 0 10*3/uL (ref 0.0–0.1)
Immature Granulocytes: 0 %
Lymphocytes Absolute: 3.7 10*3/uL — ABNORMAL HIGH (ref 0.7–3.1)
Lymphs: 44 %
MCH: 29.5 pg (ref 26.6–33.0)
MCHC: 33 g/dL (ref 31.5–35.7)
MCV: 89 fL (ref 79–97)
Monocytes Absolute: 0.8 10*3/uL (ref 0.1–0.9)
Monocytes: 10 %
Neutrophils Absolute: 3.7 10*3/uL (ref 1.4–7.0)
Neutrophils: 44 %
Platelets: 256 10*3/uL (ref 150–450)
RBC: 4.65 x10E6/uL (ref 3.77–5.28)
RDW: 12.1 % (ref 11.7–15.4)
WBC: 8.4 10*3/uL (ref 3.4–10.8)

## 2021-05-21 LAB — LIPID PANEL W/O CHOL/HDL RATIO
Cholesterol, Total: 247 mg/dL — ABNORMAL HIGH (ref 100–199)
HDL: 52 mg/dL (ref >39)
LDL Chol Calc (NIH): 175 mg/dL — ABNORMAL HIGH (ref 0–99)
Triglycerides: 114 mg/dL (ref 0–149)
VLDL Cholesterol Cal: 20 mg/dL (ref 5–40)

## 2021-05-21 LAB — TSH: TSH: 1.79 u[IU]/mL (ref 0.450–4.500)

## 2021-05-21 NOTE — Telephone Encounter (Signed)
Thank you :)

## 2021-05-21 NOTE — Telephone Encounter (Signed)
Copied from [place] [id number]. Topic: Appointment Scheduling - Scheduling Inquiry for Clinic >> May 20, 2021  4:44 PM Pawlus, Kierrah A wrote: Reason for CRM: Pt was seen today and wanted to schedule an appt with Dr Wynetta Emery to get her back adjusted, Pt was not sure if this needed to be a standard office visit or if she would need more than 20 mins.

## 2021-05-24 ENCOUNTER — Ambulatory Visit: Payer: BC Managed Care – PPO | Admitting: Family Medicine

## 2021-05-27 ENCOUNTER — Other Ambulatory Visit: Payer: Self-pay

## 2021-05-27 ENCOUNTER — Ambulatory Visit: Payer: BC Managed Care – PPO | Admitting: Family Medicine

## 2021-05-27 ENCOUNTER — Encounter: Payer: Self-pay | Admitting: Family Medicine

## 2021-05-27 VITALS — BP 119/75 | HR 68 | Temp 98.2°F | Wt 210.0 lb

## 2021-05-27 DIAGNOSIS — M9901 Segmental and somatic dysfunction of cervical region: Secondary | ICD-10-CM

## 2021-05-27 DIAGNOSIS — M9900 Segmental and somatic dysfunction of head region: Secondary | ICD-10-CM | POA: Diagnosis not present

## 2021-05-27 DIAGNOSIS — M9905 Segmental and somatic dysfunction of pelvic region: Secondary | ICD-10-CM

## 2021-05-27 DIAGNOSIS — M9904 Segmental and somatic dysfunction of sacral region: Secondary | ICD-10-CM

## 2021-05-27 DIAGNOSIS — M9902 Segmental and somatic dysfunction of thoracic region: Secondary | ICD-10-CM | POA: Diagnosis not present

## 2021-05-27 DIAGNOSIS — M545 Low back pain, unspecified: Secondary | ICD-10-CM | POA: Diagnosis not present

## 2021-05-27 DIAGNOSIS — M9903 Segmental and somatic dysfunction of lumbar region: Secondary | ICD-10-CM

## 2021-05-27 DIAGNOSIS — M9908 Segmental and somatic dysfunction of rib cage: Secondary | ICD-10-CM | POA: Diagnosis not present

## 2021-05-27 DIAGNOSIS — M9909 Segmental and somatic dysfunction of abdomen and other regions: Secondary | ICD-10-CM | POA: Diagnosis not present

## 2021-05-27 NOTE — Progress Notes (Signed)
BP 119/75    Pulse 68    Temp 98.2 F (36.8 C)    Wt 210 lb (95.3 kg)    LMP  (LMP Unknown)    SpO2 96%    BMI 36.05 kg/m    Subjective:    Patient ID: Tracie Sheppard, female    DOB: [birth year], 66 y.o.   MRN: [id number]  HPI: Tracie Sheppard is a 66 y.o. female  Chief Complaint  Patient presents with   Back Pain   Tracie Sheppard presents today for evaluation and possible treatment with OMM for back pain. She notes that her back has not been doing well. She notes that she has been having aching and soreness in her low back. It has been worse with activity and better with rest and OMT. Pain is bilateral in the low back. No radiation. She is otherwise feeling well with no other concerns or complaints at this time.   Relevant past medical, surgical, family and social history reviewed and updated as indicated. Interim medical history since our last visit reviewed. Allergies and medications reviewed and updated.  Review of Systems  Constitutional: Negative.   Respiratory: Negative.    Cardiovascular: Negative.   Gastrointestinal: Negative.   Musculoskeletal:  Positive for back pain and myalgias. Negative for arthralgias, gait problem, joint swelling, neck pain and neck stiffness.  Skin: Negative.   Neurological: Negative.   Psychiatric/Behavioral: Negative.     Per HPI unless specifically indicated above     Objective:    BP 119/75    Pulse 68    Temp 98.2 F (36.8 C)    Wt 210 lb (95.3 kg)    LMP  (LMP Unknown)    SpO2 96%    BMI 36.05 kg/m   Wt Readings from Last 3 Encounters:  05/27/21 210 lb (95.3 kg)  05/20/21 213 lb 6.4 oz (96.8 kg)  02/08/21 210 lb 12.8 oz (95.6 kg)    Physical Exam Vitals and nursing note reviewed.  Constitutional:      General: She is not in acute distress.    Appearance: Normal appearance. She is not ill-appearing.  HENT:     Head: Normocephalic and atraumatic.     Right Ear: External ear normal.     Left Ear: External ear normal.     Nose: Nose  normal.     Mouth/Throat:     Mouth: Mucous membranes are moist.     Pharynx: Oropharynx is clear.  Eyes:     Extraocular Movements: Extraocular movements intact.     Conjunctiva/sclera: Conjunctivae normal.     Pupils: Pupils are equal, round, and reactive to light.  Neck:     Vascular: No carotid bruit.  Cardiovascular:     Rate and Rhythm: Normal rate.     Pulses: Normal pulses.  Pulmonary:     Effort: Pulmonary effort is normal. No respiratory distress.  Abdominal:     General: Abdomen is flat. There is no distension.     Palpations: Abdomen is soft. There is no mass.     Tenderness: There is no abdominal tenderness. There is no right CVA tenderness, left CVA tenderness, guarding or rebound.     Hernia: No hernia is present.  Musculoskeletal:     Cervical back: No muscular tenderness.  Lymphadenopathy:     Cervical: No cervical adenopathy.  Skin:    General: Skin is warm and dry.     Capillary Refill: Capillary refill takes less than 2 seconds.  Coloration: Skin is not jaundiced or pale.     Findings: No bruising, erythema, lesion or rash.  Neurological:     General: No focal deficit present.     Mental Status: She is alert. Mental status is at baseline.  Psychiatric:        Mood and Affect: Mood normal.        Behavior: Behavior normal.        Thought Content: Thought content normal.        Judgment: Judgment normal.  Musculoskeletal:  Exam found Decreased ROM, Tissue texture changes, Tenderness to palpation, and Asymmetry of patient's  head, neck, thorax, ribs, lumbar, pelvis, sacrum, and abdomen Osteopathic Structural Exam:   Head: hypertonic subocciptal muscles   Neck: C4ESRR, SCM hypertonic on the R  Thorax: T4-5SLRR  Ribs: Ribs 5-8 locked up on the L  Lumbar: L3-5SLRR, QL hypertonic on the R, psoas hypertonic on the R   Pelvis: Anterior R innominate  Sacrum: R on R torsion  Abdomen: diaphragm hypertonic on the R   Results for orders placed or performed in  visit on 05/20/21  Microscopic Examination   Urine  Result Value Ref Range   WBC, UA None seen 0 - 5 /hpf   RBC 0-2 0 - 2 /hpf   Epithelial Cells (non renal) None seen 0 - 10 /hpf   Bacteria, UA None seen None seen/Few  CBC with Differential/Platelet  Result Value Ref Range   WBC 8.4 3.4 - 10.8 x10E3/uL   RBC 4.65 3.77 - 5.28 x10E6/uL   Hemoglobin 13.7 11.1 - 15.9 g/dL   Hematocrit 41.5 34.0 - 46.6 %   MCV 89 79 - 97 fL   MCH 29.5 26.6 - 33.0 pg   MCHC 33.0 31.5 - 35.7 g/dL   RDW 12.1 11.7 - 15.4 %   Platelets 256 150 - 450 x10E3/uL   Neutrophils 44 Not Estab. %   Lymphs 44 Not Estab. %   Monocytes 10 Not Estab. %   Eos 1 Not Estab. %   Basos 1 Not Estab. %   Neutrophils Absolute 3.7 1.4 - 7.0 x10E3/uL   Lymphocytes Absolute 3.7 (H) 0.7 - 3.1 x10E3/uL   Monocytes Absolute 0.8 0.1 - 0.9 x10E3/uL   EOS (ABSOLUTE) 0.1 0.0 - 0.4 x10E3/uL   Basophils Absolute 0.0 0.0 - 0.2 x10E3/uL   Immature Granulocytes 0 Not Estab. %   Immature Grans (Abs) 0.0 0.0 - 0.1 x10E3/uL  Comprehensive metabolic panel  Result Value Ref Range   Glucose 86 70 - 99 mg/dL   BUN 15 8 - 27 mg/dL   Creatinine, Ser 0.96 0.57 - 1.00 mg/dL   eGFR 66 >59 mL/min/1.73   BUN/Creatinine Ratio 16 12 - 28   Sodium 140 134 - 144 mmol/L   Potassium 4.4 3.5 - 5.2 mmol/L   Chloride 101 96 - 106 mmol/L   CO2 23 20 - 29 mmol/L   Calcium 10.2 8.7 - 10.3 mg/dL   Total Protein 7.4 6.0 - 8.5 g/dL   Albumin 4.6 3.8 - 4.8 g/dL   Globulin, Total 2.8 1.5 - 4.5 g/dL   Albumin/Globulin Ratio 1.6 1.2 - 2.2   Bilirubin Total 0.8 0.0 - 1.2 mg/dL   Alkaline Phosphatase 91 44 - 121 IU/L   AST 41 (H) 0 - 40 IU/L   ALT 54 (H) 0 - 32 IU/L  Lipid Panel w/o Chol/HDL Ratio  Result Value Ref Range   Cholesterol, Total 247 (H) 100 - 199 mg/dL  Triglycerides 114 0 - 149 mg/dL   HDL 52 >39 mg/dL   VLDL Cholesterol Cal 20 5 - 40 mg/dL   LDL Chol Calc (NIH) 175 (H) 0 - 99 mg/dL  Urinalysis, Routine w reflex microscopic  Result Value  Ref Range   Specific Gravity, UA 1.025 1.005 - 1.030   pH, UA 6.0 5.0 - 7.5   Color, UA Yellow Yellow   Appearance Ur Clear Clear   Leukocytes,UA Negative Negative   Protein,UA Negative Negative/Trace   Glucose, UA Negative Negative   Ketones, UA Negative Negative   RBC, UA Trace (A) Negative   Bilirubin, UA Negative Negative   Urobilinogen, Ur 0.2 0.2 - 1.0 mg/dL   Nitrite, UA Negative Negative   Microscopic Examination See below:   TSH  Result Value Ref Range   TSH 1.790 0.450 - 4.500 uIU/mL  Microalbumin, Urine Waived  Result Value Ref Range   Microalb, Ur Waived 30 (H) 0 - 19 mg/L   Creatinine, Urine Waived 300 10 - 300 mg/dL   Microalb/Creat Ratio <30 <30 mg/g      Assessment & Plan:   Problem List Items Addressed This Visit       Other   Low back pain - Primary   Other Visit Diagnoses     Head region somatic dysfunction       Cervical segment dysfunction       Thoracic segment dysfunction       Somatic dysfunction of lumbar region       Somatic dysfunction of sacral region       Somatic dysfunction of pelvis region       Rib cage region somatic dysfunction       Segmental dysfunction of abdomen          After verbal consent was obtained, patient was treated today with osteopathic manipulative medicine to the regions of the head, neck, thorax, ribs, lumbar, pelvis, sacrum, and abdomen using the techniques of cranial, myofascial release, counterstrain, muscle energy, HVLA, and soft tissue. Areas of compensation relating to her primary pain source also treated. Patient tolerated the procedure well with good objective and good subjective improvement in symptoms. She left the room in good condition. She was advised to stay well hydrated and that she may have some soreness following the procedure. If not improving or worsening, she will call and come in. She will return for reevaluation  on a PRN basis.   Follow up plan: Return if symptoms worsen or fail to  improve.

## 2021-06-03 ENCOUNTER — Ambulatory Visit: Payer: BC Managed Care – PPO | Admitting: Family Medicine

## 2021-06-07 ENCOUNTER — Encounter: Payer: Self-pay | Admitting: Family Medicine

## 2021-06-24 ENCOUNTER — Other Ambulatory Visit: Payer: Self-pay

## 2021-06-24 ENCOUNTER — Ambulatory Visit: Payer: BC Managed Care – PPO | Admitting: Family Medicine

## 2021-06-24 ENCOUNTER — Encounter: Payer: Self-pay | Admitting: Family Medicine

## 2021-06-24 VITALS — BP 127/82 | HR 75 | Temp 98.2°F | Wt 211.0 lb

## 2021-06-24 DIAGNOSIS — F331 Major depressive disorder, recurrent, moderate: Secondary | ICD-10-CM

## 2021-06-24 DIAGNOSIS — R310 Gross hematuria: Secondary | ICD-10-CM | POA: Diagnosis not present

## 2021-06-24 LAB — URINALYSIS, ROUTINE W REFLEX MICROSCOPIC
Bilirubin, UA: NEGATIVE
Glucose, UA: NEGATIVE
Ketones, UA: NEGATIVE
Leukocytes,UA: NEGATIVE
Nitrite, UA: NEGATIVE
Specific Gravity, UA: >1.03 — ABNORMAL HIGH (ref 1.005–1.030)
Urobilinogen, Ur: 1 mg/dL (ref 0.2–1.0)
pH, UA: 5.5 (ref 5.0–7.5)

## 2021-06-24 LAB — MICROSCOPIC EXAMINATION: Bacteria, UA: NONE SEEN

## 2021-06-24 NOTE — Patient Instructions (Signed)
Appointment with Dr. Elenor Quinones ?06/30/21 9AM ?

## 2021-06-24 NOTE — Assessment & Plan Note (Signed)
Under stable control on current regimen. Continue current regimen. Continue to monitor. Call with any concerns. Refills up to date.  ? ?

## 2021-06-24 NOTE — Progress Notes (Signed)
? ?BP 127/82 (BP Location: Left Arm, Cuff Size: Normal)   Pulse 75   Temp 98.2 ?F (36.8 ?C) (Oral)   Wt 211 lb (95.7 kg)   LMP  (LMP Unknown)   SpO2 98%   BMI 36.22 kg/m?   ? ?Subjective:  ? ? Patient ID: Tracie Sheppard, female    DOB: 05-28-1955, 66 y.o.   MRN: [id number] ? ?HPI: ?Tracie Sheppard is a 66 y.o. female ? ?Chief Complaint  ?Patient presents with  ? Depression  ? ?DEPRESSION ?Mood status: stable ?Satisfied with current treatment?: yes ?Symptom severity: moderate  ?Duration of current treatment : months ?Side effects: no ?Medication compliance: excellent compliance ?Psychotherapy/counseling: no  ?Previous psychiatric medications: wellbutrin, celexa, klonopin ?Depressed mood: yes ?Anxious mood: yes ?Anhedonia: no ?Significant weight loss or gain: no ?Insomnia: yes  ?Fatigue: yes ?Feelings of worthlessness or guilt: yes ?Impaired concentration/indecisiveness: yes ?Suicidal ideations: no ?Hopelessness: no ?Crying spells: yes ? ?  06/24/2021  ? 10:18 AM 05/27/2021  ?  9:44 AM 05/20/2021  ?  1:50 PM 11/03/2020  ? 10:05 AM 08/28/2020  ?  9:53 AM  ?Depression screen PHQ 2/9  ?Decreased Interest 1 1 1 1 1   ?Down, Depressed, Hopeless 2 1 1 1  0  ?PHQ - 2 Score 3 2 2 2 1   ?Altered sleeping 3 3 3 3 3   ?Tired, decreased energy 2 2 2 2 1   ?Change in appetite 3 3 3 3 3   ?Feeling bad or failure about yourself  1 1 1  0 0  ?Trouble concentrating 1 0 0 0 1  ?Moving slowly or fidgety/restless 0 0 0 0 0  ?Suicidal thoughts 0 0 0 0 0  ?PHQ-9 Score 13 11 11 10 9   ?Difficult doing work/chores Somewhat difficult   Not difficult at all Not difficult at all  ? ? ?  06/24/2021  ? 10:18 AM 05/27/2021  ?  9:44 AM 05/20/2021  ?  1:51 PM 11/03/2020  ? 10:07 AM  ?GAD 7 : Generalized Anxiety Score  ?Nervous, Anxious, on Edge 1 3 1 1   ?Control/stop worrying 1 1 1 1   ?Worry too much - different things 1 1 1 1   ?Trouble relaxing 1 1 2  0  ?Restless 0 0 1 0  ?Easily annoyed or irritable 0 1 2 0  ?Afraid - awful might happen 1 1 0 1  ?Total GAD 7  Score 5 8 8 4   ?Anxiety Difficulty Somewhat difficult  Somewhat difficult Not difficult at all  ? ?URINARY SYMPTOMS ?Duration: chronic- coming and going ?Dysuria: no ?Urinary frequency: no ?Urgency: no ?Small volume voids: no ?Symptom severity: no ?Urinary incontinence: no ?Foul odor: no ?Hematuria: yes ?Abdominal pain: yes ?Back pain: yes ?Suprapubic pain/pressure: yes ?Flank pain: no ?Fever:  no ?Vomiting: no ?Relief with cranberry juice: no ?Relief with pyridium: no ?Status: fluctuating ?Previous urinary tract infection: yes ?Recurrent urinary tract infection: no ?Sexual activity: monogomous ?History of sexually transmitted disease: no ?Vaginal discharge: no ?Treatments attempted: increasing fluids  ? ?Relevant past medical, surgical, family and social history reviewed and updated as indicated. Interim medical history since our last visit reviewed. ?Allergies and medications reviewed and updated. ? ?Review of Systems  ?Constitutional: Negative.   ?Respiratory: Negative.    ?Cardiovascular: Negative.   ?Musculoskeletal: Negative.   ?Psychiatric/Behavioral:  Positive for dysphoric mood. Negative for agitation, behavioral problems, confusion, decreased concentration, hallucinations, self-injury, sleep disturbance and suicidal ideas. The patient is nervous/anxious. The patient is not hyperactive.   ? ?Per HPI unless  specifically indicated above ? ?   ?Objective:  ?  ?BP 127/82 (BP Location: Left Arm, Cuff Size: Normal)   Pulse 75   Temp 98.2 ?F (36.8 ?C) (Oral)   Wt 211 lb (95.7 kg)   LMP  (LMP Unknown)   SpO2 98%   BMI 36.22 kg/m?   ?Wt Readings from Last 3 Encounters:  ?06/24/21 211 lb (95.7 kg)  ?05/27/21 210 lb (95.3 kg)  ?05/20/21 213 lb 6.4 oz (96.8 kg)  ?  ?Physical Exam ?Vitals and nursing note reviewed.  ?Constitutional:   ?   General: She is not in acute distress. ?   Appearance: Normal appearance. She is not ill-appearing, toxic-appearing or diaphoretic.  ?HENT:  ?   Head: Normocephalic and  atraumatic.  ?   Right Ear: External ear normal.  ?   Left Ear: External ear normal.  ?   Nose: Nose normal.  ?   Mouth/Throat:  ?   Mouth: Mucous membranes are moist.  ?   Pharynx: Oropharynx is clear.  ?Eyes:  ?   General: No scleral icterus.    ?   Right eye: No discharge.     ?   Left eye: No discharge.  ?   Extraocular Movements: Extraocular movements intact.  ?   Conjunctiva/sclera: Conjunctivae normal.  ?   Pupils: Pupils are equal, round, and reactive to light.  ?Cardiovascular:  ?   Rate and Rhythm: Normal rate and regular rhythm.  ?   Pulses: Normal pulses.  ?   Heart sounds: Normal heart sounds. No murmur heard. ?  No friction rub. No gallop.  ?Pulmonary:  ?   Effort: Pulmonary effort is normal. No respiratory distress.  ?   Breath sounds: Normal breath sounds. No stridor. No wheezing, rhonchi or rales.  ?Chest:  ?   Chest wall: No tenderness.  ?Musculoskeletal:     ?   General: Normal range of motion.  ?   Cervical back: Normal range of motion and neck supple.  ?Skin: ?   General: Skin is warm and dry.  ?   Capillary Refill: Capillary refill takes less than 2 seconds.  ?   Coloration: Skin is not jaundiced or pale.  ?   Findings: No bruising, erythema, lesion or rash.  ?Neurological:  ?   General: No focal deficit present.  ?   Mental Status: She is alert and oriented to person, place, and time. Mental status is at baseline.  ?Psychiatric:     ?   Mood and Affect: Mood normal.     ?   Behavior: Behavior normal.     ?   Thought Content: Thought content normal.     ?   Judgment: Judgment normal.  ? ? ?Results for orders placed or performed in visit on 05/20/21  ?Microscopic Examination  ? Urine  ?Result Value Ref Range  ? WBC, UA None seen 0 - 5 /hpf  ? RBC 0-2 0 - 2 /hpf  ? Epithelial Cells (non renal) None seen 0 - 10 /hpf  ? Bacteria, UA None seen None seen/Few  ?CBC with Differential/Platelet  ?Result Value Ref Range  ? WBC 8.4 3.4 - 10.8 x10E3/uL  ? RBC 4.65 3.77 - 5.28 x10E6/uL  ? Hemoglobin 13.7  11.1 - 15.9 g/dL  ? Hematocrit 41.5 34.0 - 46.6 %  ? MCV 89 79 - 97 fL  ? MCH 29.5 26.6 - 33.0 pg  ? MCHC 33.0 31.5 - 35.7 g/dL  ? RDW 12.1  11.7 - 15.4 %  ? Platelets 256 150 - 450 x10E3/uL  ? Neutrophils 44 Not Estab. %  ? Lymphs 44 Not Estab. %  ? Monocytes 10 Not Estab. %  ? Eos 1 Not Estab. %  ? Basos 1 Not Estab. %  ? Neutrophils Absolute 3.7 1.4 - 7.0 x10E3/uL  ? Lymphocytes Absolute 3.7 (H) 0.7 - 3.1 x10E3/uL  ? Monocytes Absolute 0.8 0.1 - 0.9 x10E3/uL  ? EOS (ABSOLUTE) 0.1 0.0 - 0.4 x10E3/uL  ? Basophils Absolute 0.0 0.0 - 0.2 x10E3/uL  ? Immature Granulocytes 0 Not Estab. %  ? Immature Grans (Abs) 0.0 0.0 - 0.1 x10E3/uL  ?Comprehensive metabolic panel  ?Result Value Ref Range  ? Glucose 86 70 - 99 mg/dL  ? BUN 15 8 - 27 mg/dL  ? Creatinine, Ser 0.96 0.57 - 1.00 mg/dL  ? eGFR 66 >59 mL/min/1.73  ? BUN/Creatinine Ratio 16 12 - 28  ? Sodium 140 134 - 144 mmol/L  ? Potassium 4.4 3.5 - 5.2 mmol/L  ? Chloride 101 96 - 106 mmol/L  ? CO2 23 20 - 29 mmol/L  ? Calcium 10.2 8.7 - 10.3 mg/dL  ? Total Protein 7.4 6.0 - 8.5 g/dL  ? Albumin 4.6 3.8 - 4.8 g/dL  ? Globulin, Total 2.8 1.5 - 4.5 g/dL  ? Albumin/Globulin Ratio 1.6 1.2 - 2.2  ? Bilirubin Total 0.8 0.0 - 1.2 mg/dL  ? Alkaline Phosphatase 91 44 - 121 IU/L  ? AST 41 (H) 0 - 40 IU/L  ? ALT 54 (H) 0 - 32 IU/L  ?Lipid Panel w/o Chol/HDL Ratio  ?Result Value Ref Range  ? Cholesterol, Total 247 (H) 100 - 199 mg/dL  ? Triglycerides 114 0 - 149 mg/dL  ? HDL 52 >39 mg/dL  ? VLDL Cholesterol Cal 20 5 - 40 mg/dL  ? LDL Chol Calc (NIH) 175 (H) 0 - 99 mg/dL  ?Urinalysis, Routine w reflex microscopic  ?Result Value Ref Range  ? Specific Gravity, UA 1.025 1.005 - 1.030  ? pH, UA 6.0 5.0 - 7.5  ? Color, UA Yellow Yellow  ? Appearance Ur Clear Clear  ? Leukocytes,UA Negative Negative  ? Protein,UA Negative Negative/Trace  ? Glucose, UA Negative Negative  ? Ketones, UA Negative Negative  ? RBC, UA Trace (A) Negative  ? Bilirubin, UA Negative Negative  ? Urobilinogen, Ur 0.2 0.2 -  1.0 mg/dL  ? Nitrite, UA Negative Negative  ? Microscopic Examination See below:   ?TSH  ?Result Value Ref Range  ? TSH 1.790 0.450 - 4.500 uIU/mL  ?Microalbumin, Urine Waived  ?Result Value Ref Range  ? Microalb, Ur Waived 30

## 2021-06-24 NOTE — Assessment & Plan Note (Signed)
Has been coming and going. History of kidney stones. Has not seen urology in about a year. Will get her reset up for an appointment. ?

## 2021-06-29 ENCOUNTER — Other Ambulatory Visit: Payer: Self-pay

## 2021-06-29 ENCOUNTER — Encounter: Payer: Self-pay | Admitting: Family Medicine

## 2021-06-29 ENCOUNTER — Ambulatory Visit (INDEPENDENT_AMBULATORY_CARE_PROVIDER_SITE_OTHER): Payer: BC Managed Care – PPO | Admitting: Family Medicine

## 2021-06-29 VITALS — BP 117/72 | HR 81 | Temp 98.2°F | Wt 212.0 lb

## 2021-06-29 DIAGNOSIS — M9903 Segmental and somatic dysfunction of lumbar region: Secondary | ICD-10-CM

## 2021-06-29 DIAGNOSIS — M9906 Segmental and somatic dysfunction of lower extremity: Secondary | ICD-10-CM | POA: Diagnosis not present

## 2021-06-29 DIAGNOSIS — G8929 Other chronic pain: Secondary | ICD-10-CM

## 2021-06-29 DIAGNOSIS — M9908 Segmental and somatic dysfunction of rib cage: Secondary | ICD-10-CM | POA: Diagnosis not present

## 2021-06-29 DIAGNOSIS — M545 Low back pain, unspecified: Secondary | ICD-10-CM

## 2021-06-29 DIAGNOSIS — M9904 Segmental and somatic dysfunction of sacral region: Secondary | ICD-10-CM

## 2021-06-29 DIAGNOSIS — M9905 Segmental and somatic dysfunction of pelvic region: Secondary | ICD-10-CM | POA: Diagnosis not present

## 2021-06-29 NOTE — Progress Notes (Signed)
? ?BP 117/72   Pulse 81   Temp 98.2 ?F (36.8 ?C)   Wt 212 lb (96.2 kg)   LMP  (LMP Unknown)   SpO2 97%   BMI 36.39 kg/m?   ? ?Subjective:  ? ? Patient ID: Tracie Sheppard, female    DOB: [birth year], 66 y.o.   MRN: [id number] ? ?HPI: ?Tracie Sheppard is a 66 y.o. female ? ?Chief Complaint  ?Patient presents with  ? Back Pain  ? ?Tracie Sheppard presents today for evaluation and possible treatment with OMT for her back pain. Seeing urology tomorrow for her hematuria. It has not happened again since she was here last week. She notes that she is feeling OK. Her back has been acting up was bad yesterday. Bilateral low back. No radiation. No other symptoms. Worse with movement and stress. Better with OMT, meds and rest. This is chronic, but worsening. She is otherwise feeling well with no other concerns or complaints at this time.  ? ?Relevant past medical, surgical, family and social history reviewed and updated as indicated. Interim medical history since our last visit reviewed. ?Allergies and medications reviewed and updated. ? ?Review of Systems  ?Constitutional: Negative.   ?Respiratory: Negative.    ?Cardiovascular: Negative.   ?Gastrointestinal: Negative.   ?Musculoskeletal:  Positive for arthralgias, back pain and myalgias. Negative for gait problem, joint swelling, neck pain and neck stiffness.  ?Skin: Negative.   ?Neurological: Negative.   ?Psychiatric/Behavioral: Negative.    ? ?Per HPI unless specifically indicated above ? ?   ?Objective:  ?  ?BP 117/72   Pulse 81   Temp 98.2 ?F (36.8 ?C)   Wt 212 lb (96.2 kg)   LMP  (LMP Unknown)   SpO2 97%   BMI 36.39 kg/m?   ?Wt Readings from Last 3 Encounters:  ?06/29/21 212 lb (96.2 kg)  ?06/24/21 211 lb (95.7 kg)  ?05/27/21 210 lb (95.3 kg)  ?  ?Physical Exam ?Vitals and nursing note reviewed.  ?Constitutional:   ?   General: She is not in acute distress. ?   Appearance: Normal appearance. She is not ill-appearing.  ?HENT:  ?   Head: Normocephalic and atraumatic.  ?    Right Ear: External ear normal.  ?   Left Ear: External ear normal.  ?   Nose: Nose normal.  ?   Mouth/Throat:  ?   Mouth: Mucous membranes are moist.  ?   Pharynx: Oropharynx is clear.  ?Eyes:  ?   Extraocular Movements: Extraocular movements intact.  ?   Conjunctiva/sclera: Conjunctivae normal.  ?   Pupils: Pupils are equal, round, and reactive to light.  ?Neck:  ?   Vascular: No carotid bruit.  ?Cardiovascular:  ?   Rate and Rhythm: Normal rate.  ?   Pulses: Normal pulses.  ?Pulmonary:  ?   Effort: Pulmonary effort is normal. No respiratory distress.  ?Abdominal:  ?   General: Abdomen is flat. There is no distension.  ?   Palpations: Abdomen is soft. There is no mass.  ?   Tenderness: There is no abdominal tenderness. There is no right CVA tenderness, left CVA tenderness, guarding or rebound.  ?   Hernia: No hernia is present.  ?Musculoskeletal:  ?   Cervical back: No muscular tenderness.  ?Lymphadenopathy:  ?   Cervical: No cervical adenopathy.  ?Skin: ?   General: Skin is warm and dry.  ?   Capillary Refill: Capillary refill takes less than 2 seconds.  ?  Coloration: Skin is not jaundiced or pale.  ?   Findings: No bruising, erythema, lesion or rash.  ?Neurological:  ?   General: No focal deficit present.  ?   Mental Status: She is alert. Mental status is at baseline.  ?Psychiatric:     ?   Mood and Affect: Mood normal.     ?   Behavior: Behavior normal.     ?   Thought Content: Thought content normal.     ?   Judgment: Judgment normal.  ? ?Musculoskeletal:  Exam found Decreased ROM, Tissue texture changes, Tenderness to palpation, and Asymmetry of patient's  ribs, lumbar, pelvis, sacrum, and lower extremity ?Osteopathic Structural Exam:  ? Ribs: Rib 7 locked up on the R ? Lumbar: L3-5SLRR ? Pelvis: Posterior L innominate ? Sacrum: R on R torsion ? Lower Extremity: IT band hypertonic bilaterally, L fibular head anterior, fascial drag from anterior R ankle into lower leg  ? ?Results for orders placed or  performed in visit on 06/24/21  ?Microscopic Examination  ? Urine  ?Result Value Ref Range  ? WBC, UA 0-5 0 - 5 /hpf  ? RBC 3-10 (A) 0 - 2 /hpf  ? Epithelial Cells (non renal) 0-10 0 - 10 /hpf  ? Bacteria, UA None seen None seen/Few  ?Urinalysis, Routine w reflex microscopic  ?Result Value Ref Range  ? Specific Gravity, UA >1.030 (H) 1.005 - 1.030  ? pH, UA 5.5 5.0 - 7.5  ? Color, UA Yellow Yellow  ? Appearance Ur Clear Clear  ? Leukocytes,UA Negative Negative  ? Protein,UA Trace (A) Negative/Trace  ? Glucose, UA Negative Negative  ? Ketones, UA Negative Negative  ? RBC, UA 2+ (A) Negative  ? Bilirubin, UA Negative Negative  ? Urobilinogen, Ur 1.0 0.2 - 1.0 mg/dL  ? Nitrite, UA Negative Negative  ? Microscopic Examination See below:   ? ?   ?Assessment & Plan:  ? ?Problem List Items Addressed This Visit   ? ?  ? Other  ? Low back pain - Primary  ?  In exacerbation, which seems to be caused by her hypertonic bilateral IT bands. Will start exercises. Discussed foam rolling. She does have somatic dysfunction that is contributing to her symptoms. Treated today with good results as below.  ?  ?  ? ?Other Visit Diagnoses   ? ? Somatic dysfunction of lumbar region      ? Somatic dysfunction of sacral region      ? Somatic dysfunction of pelvis region      ? Rib cage region somatic dysfunction      ? Somatic dysfunction of lower extremities      ? ?  ?After verbal consent was obtained, patient was treated today with osteopathic manipulative medicine to the regions of the ribs, lumbar, pelvis, sacrum, and lower extremity using the techniques of myofascial release, counterstrain, muscle energy, HVLA, and soft tissue. Areas of compensation relating to her primary pain source also treated. Patient tolerated the procedure well with good objective and good subjective improvement in symptoms. She eft the room in good condition. She was advised to stay well hydrated and that she may have some soreness following the procedure. If  not improving or worsening, she will call and come in. Home exercise program of stretches for  IT bands  discussed and demonstrated today. Patient will do these stretches BID to before the point of pain, and will return for reevaluation  on a PRN basis. ? ? ?Follow  up plan: ?Return if symptoms worsen or fail to improve. ? ? ? ? ? ?

## 2021-06-29 NOTE — Assessment & Plan Note (Signed)
In exacerbation, which seems to be caused by her hypertonic bilateral IT bands. Will start exercises. Discussed foam rolling. She does have somatic dysfunction that is contributing to her symptoms. Treated today with good results as below.  ?

## 2021-06-30 ENCOUNTER — Ambulatory Visit: Payer: BC Managed Care – PPO | Admitting: Urology

## 2021-06-30 ENCOUNTER — Ambulatory Visit
Admission: RE | Admit: 2021-06-30 | Discharge: 2021-06-30 | Disposition: A | Discharge status: Home / Self Care | Payer: BC Managed Care – PPO | Source: Ambulatory Visit | Attending: Urology | Admitting: Urology

## 2021-06-30 ENCOUNTER — Encounter: Payer: Self-pay | Admitting: Urology

## 2021-06-30 ENCOUNTER — Ambulatory Visit
Admission: RE | Admit: 2021-06-30 | Discharge: 2021-06-30 | Disposition: A | Discharge status: Home / Self Care | Payer: BC Managed Care – PPO | Attending: Urology | Admitting: Urology

## 2021-06-30 VITALS — BP 141/79 | HR 79 | Ht 64.0 in | Wt 205.0 lb

## 2021-06-30 DIAGNOSIS — R310 Gross hematuria: Secondary | ICD-10-CM | POA: Diagnosis not present

## 2021-06-30 DIAGNOSIS — N201 Calculus of ureter: Secondary | ICD-10-CM

## 2021-06-30 DIAGNOSIS — R1030 Lower abdominal pain, unspecified: Secondary | ICD-10-CM

## 2021-06-30 DIAGNOSIS — N200 Calculus of kidney: Secondary | ICD-10-CM

## 2021-06-30 LAB — URINALYSIS, COMPLETE
Bilirubin, UA: NEGATIVE
Glucose, UA: NEGATIVE
Ketones, UA: NEGATIVE
Leukocytes,UA: NEGATIVE
Nitrite, UA: NEGATIVE
Protein,UA: NEGATIVE
Specific Gravity, UA: >1.03 — ABNORMAL HIGH (ref 1.005–1.030)
Urobilinogen, Ur: 0.2 mg/dL (ref 0.2–1.0)
pH, UA: 5.5 (ref 5.0–7.5)

## 2021-06-30 LAB — MICROSCOPIC EXAMINATION

## 2021-06-30 NOTE — Progress Notes (Signed)
? ?06/30/2021 ?9:10 AM  ? ?Tracie Sheppard ?07/05/1955 ?[id number] ? ?Referring provider: Valerie Roys, DO ?[facility] ?[place],  [place] [address] ? ?Chief Complaint  ?Patient presents with  ? Hematuria  ? ? ?HPI: ?Dr. Wynetta Emery recommended follow-up visit for microhematuria. ? ?I initially saw 06/03/2020 for a 4 mm right proximal ureteral calculus ?Her symptoms resolved but she was not aware of passing a stone ?CT also showed nonobstructing renal calculi bilaterally ?Recently with intermittent episodes of pinkish tinged urine ?Also with intermittent lower abdominal pain ?No severe renal colic, fever or chills ?Does have baseline urinary urgency ?UA 06/24/2021 with 3-10 RBC on microscopy ? ? ?PMH: ?Past Medical History:  ?Diagnosis Date  ? Allergy   ? Seasonal  ? Anxiety   ? Bilateral carpal tunnel syndrome 05/30/2017  ? Chronic back pain   ? Depression, major, recurrent, moderate ([place])   ? Diabetes mellitus without complication ([place])   ? Prediabetic  ? High serum testosterone   ? HTN (hypertension) 09/12/2019  ? Kidney stone   ? Sleep apnea   ? Thyroid disease   ? did not show up in lab work but per other symptoms previous doctor started her on this  ? ? ?Surgical History: ?Past Surgical History:  ?Procedure Laterality Date  ? CHOLECYSTECTOMY    ? 1997  ? COLONOSCOPY WITH PROPOFOL N/A 11/16/2018  ? Procedure: COLONOSCOPY WITH PROPOFOL;  Surgeon: Lin Landsman, MD;  Location: [facility] ENDOSCOPY;  Service: Gastroenterology;  Laterality: N/A;  ? ESOPHAGOGASTRODUODENOSCOPY (EGD) WITH PROPOFOL N/A 11/16/2018  ? Procedure: ESOPHAGOGASTRODUODENOSCOPY (EGD) WITH PROPOFOL;  Surgeon: Lin Landsman, MD;  Location: [facility] ENDOSCOPY;  Service: Gastroenterology;  Laterality: N/A;  ? NASAL SEPTUM SURGERY    ? OOPHORECTOMY    ? TUBAL LIGATION    ? 1989  ? ? ?Home Medications:  ?Allergies as of 06/30/2021   ? ?   Reactions  ? Aleve [naproxen Sodium] Other (See Comments)  ? Chest pain  ? Statins Other (See Comments)  ? myalgias  ? ?  ? ?   ?Medication List  ?  ? ?  ? Accurate as of June 30, 2021  9:10 AM. If you have any questions, ask your nurse or doctor.  ?  ?  ? ?  ? ?acetaminophen 500 MG tablet ?Commonly known as: TYLENOL ?Tylenol Extra Strength 500 mg tablet ?  ?albuterol 108 (90 Base) MCG/ACT inhaler ?Commonly known as: VENTOLIN HFA ?Inhale 2 puffs into the lungs every 6 (six) hours as needed for wheezing or shortness of breath. ?  ?Alpha-Lipoic Acid 600 MG Caps ?Take 600 mg by mouth daily. ?  ?buPROPion 300 MG 24 hr tablet ?Commonly known as: WELLBUTRIN XL ?Take 1 tablet (300 mg total) by mouth daily. ?  ?cetirizine 10 MG tablet ?Commonly known as: ZYRTEC ?Take 1 tablet (10 mg total) by mouth daily as needed for allergies. ?  ?citalopram 40 MG tablet ?Commonly known as: CELEXA ?Take 1.5 tablets (60 mg total) by mouth daily. ?  ?clonazePAM 0.5 MG tablet ?Commonly known as: KLONOPIN ?TAKE 1 TABLET BY MOUTH DAILY AS NEEDED FOR ANIXETY ?  ?Cyanocobalamin 1000 MCG Tbcr ?Take 1,000 mcg by mouth daily. ?  ?fluticasone 50 MCG/ACT nasal spray ?Commonly known as: FLONASE ?SPRAY 1 SPRAY INTO EACH NOSTRIL EVERY DAY ?  ?ibuprofen 200 MG tablet ?Commonly known as: ADVIL ?Take 200 mg by mouth as needed. ?  ?propranolol ER 60 MG 24 hr capsule ?Commonly known as: INDERAL LA ?Take 1 tablet  by mouth daily. ?  ?spironolactone 100 MG tablet ?Commonly known as: ALDACTONE ?Take 1 tablet (100 mg total) by mouth 2 (two) times daily. ?  ?VITAMIN D PO ?Take by mouth daily. 1000 mcg ?  ? ?  ? ? ?Allergies:  ?Allergies  ?Allergen Reactions  ? Aleve [Naproxen Sodium] Other (See Comments)  ?  Chest pain  ? Statins Other (See Comments)  ?  myalgias  ? ? ?Family History: ?Family History  ?Problem Relation Age of Onset  ? Diabetes Mother   ? Anemia Mother   ? Hyperlipidemia Mother   ? Arthritis Mother   ? Hypertension Father   ? Cancer Father   ? Celiac disease Sister   ? Diabetes Maternal Grandmother   ? Stroke Maternal Grandmother   ? Heart attack Maternal Grandmother   ?  Heart disease Paternal Grandfather   ? ? ?Social History:  reports that she has never smoked. She has never used smokeless tobacco. She reports current alcohol use. She reports that she does not use drugs. ? ? ?Physical Exam: ?BP (!) 141/79   Pulse 79   Ht '5\' 4"'$  (1.626 m)   Wt 205 lb (93 kg)   LMP  (LMP Unknown)   BMI 35.19 kg/m?   ?Constitutional:  Alert and oriented, No acute distress. ?HEENT: [place] AT, moist mucus membranes.  Trachea midline, no masses. ?Cardiovascular: No clubbing, cyanosis, or edema. ?Respiratory: Normal respiratory effort, no increased work of breathing. ?Psychiatric: Normal mood and affect. ? ? ?Assessment & Plan:   ? ?1.  Bilateral nephrolithiasis ?Not aware of passing prior ureteral calculus ?KUB today ?May need follow-up CT ? ?2.  Gross hematuria ?As above ?Discussed possible need of CT urogram and cystoscopy ?UA ordered ? ?3.  Lower abdominal pain ?Possibly related to ureteral calculus ? ? ?[name], MD ? ?[facility] ?[address] ?[place], [place] [address] ?[phone] ?

## 2021-07-01 ENCOUNTER — Encounter: Payer: Self-pay | Admitting: Urology

## 2021-07-02 ENCOUNTER — Other Ambulatory Visit: Payer: Self-pay | Admitting: Urology

## 2021-07-02 DIAGNOSIS — R3129 Other microscopic hematuria: Secondary | ICD-10-CM

## 2021-07-02 DIAGNOSIS — N200 Calculus of kidney: Secondary | ICD-10-CM

## 2021-07-05 ENCOUNTER — Ambulatory Visit: Payer: Self-pay | Admitting: *Deleted

## 2021-07-05 NOTE — Telephone Encounter (Signed)
?  Chief Complaint: knee pain- R knee is worse- but L knee has started hurting too ?Symptoms: knee pain, leg pain ?Frequency: started last Thursday ?Pertinent Negatives: Patient denies chest pain, difficulty breathing, fever, calf pain ?Disposition: '[]'$ ED /'[]'$ Urgent Care (no appt availability in office) / '[x]'$ Appointment(In office/virtual)/ '[]'$  [facility]/ '[]'$ Home Care/ '[]'$ Refused Recommended Disposition /'[]'$ [facility]/ '[]'$  Follow-up with PCP ?Additional Notes: Patient really wanted to schedule with PCP- but is aware that this needs evaluation. Patient is also concerned about recent studies by Dr Bernardo Heater and would like PCP to review for her. Patient advised I would send note for PCP review   ?

## 2021-07-05 NOTE — Telephone Encounter (Signed)
Routing to provider to advise. Looks as if patient was seen last week but for her back pain. Would patient need another appointment?  ?

## 2021-07-05 NOTE — Telephone Encounter (Signed)
Summary: Knee Pain & Swelling advice  ? Pt is calling to report pain on R knee to to the ankle, L knee to to ankle. Pt is reporting swelling in the R knee in the inside of the knee. Pain 7 out 10.  ?  ? ?Reason for Disposition ?? [1] MODERATE pain (e.g., interferes with normal activities, limping) AND [2] present > 3 days ? ?Answer Assessment - Initial Assessment Questions ?1. LOCATION and RADIATION: "Where is the pain located?"  ?    R knee down into the ankle, now having pain in L knee ?2. QUALITY: "What does the pain feel like?"  (e.g., sharp, dull, aching, burning) ?    Tender to touch- burning sensation- inner knee ?3. SEVERITY: "How bad is the pain?" "What does it keep you from doing?"   (Scale 1-10; or mild, moderate, severe) ?  -  MILD (1-3): doesn't interfere with normal activities  ?  -  MODERATE (4-7): interferes with normal activities (e.g., work or school) or awakens from sleep, limping  ?  -  SEVERE (8-10): excruciating pain, unable to do any normal activities, unable to walk ?    moderate ?4. ONSET: "When did the pain start?" "Does it come and go, or is it there all the time?" ?    Noticed it Thursday- constant- intensity changes ?5. RECURRENT: "Have you had this pain before?" If Yes, ask: "When, and what happened then?" ?    New sensation ?6. SETTING: "Has there been any recent work, exercise or other activity that involved that part of the body?"  ?    no ?7. AGGRAVATING FACTORS: "What makes the knee pain worse?" (e.g., walking, climbing stairs, running) ?    Nothing- can change in intensity just sitting ?8. ASSOCIATED SYMPTOMS: "Is there any swelling or redness of the knee?" ?    No noticeable swelling- feels different to patient ?9. OTHER SYMPTOMS: "Do you have any other symptoms?" (e.g., chest pain, difficulty breathing, fever, calf pain) ?    Warmth from knee- uncomfortable into lower leg ?10. PREGNANCY: "Is there any chance you are pregnant?" "When was your last menstrual period?" ?       na ? ?Protocols used: Knee Pain-A-AH ? ?

## 2021-07-05 NOTE — Telephone Encounter (Signed)
She did not have any imaging done on her knee with Dr. Elenor Quinones. I'm not sure what she's asking about ?

## 2021-07-06 ENCOUNTER — Ambulatory Visit: Payer: BC Managed Care – PPO | Admitting: Internal Medicine

## 2021-07-06 NOTE — Telephone Encounter (Signed)
Called and spoke with patient. She states she had an abdominal x-ray and just wanted Dr. Durenda Age thought. States she wonders if some of this could be contributing to her pain. Patient has an appointment with Dr. Wynetta Emery 07/08/21 and will discuss concerns with results and knee pain at that visit.  ?

## 2021-07-08 ENCOUNTER — Encounter: Payer: Self-pay | Admitting: Family Medicine

## 2021-07-08 ENCOUNTER — Ambulatory Visit: Payer: BC Managed Care – PPO | Admitting: Family Medicine

## 2021-07-08 VITALS — BP 124/75 | HR 83 | Temp 97.9°F | Wt 208.6 lb

## 2021-07-08 DIAGNOSIS — M25561 Pain in right knee: Secondary | ICD-10-CM | POA: Diagnosis not present

## 2021-07-08 DIAGNOSIS — M25562 Pain in left knee: Secondary | ICD-10-CM | POA: Diagnosis not present

## 2021-07-08 NOTE — Progress Notes (Signed)
? ?BP 124/75   Pulse 83   Temp 97.9 ?F (36.6 ?C)   Wt 208 lb 9.6 oz (94.6 kg)   LMP  (LMP Unknown)   SpO2 98%   BMI 35.81 kg/m?   ? ?Subjective:  ? ? Patient ID: Tracie Sheppard, female    DOB: 08-Sep-1955, 66 y.o.   MRN: [id number] ? ?HPI: ?Tracie Sheppard is a 66 y.o. female ? ?Chief Complaint  ?Patient presents with  ? Knee Pain  ?  Patient states both her knees have been hurting since last week, pain is better today. Patient states it was a sharp shooting pain.  ? Hematuria  ?  Patient recently had xray of abdomen done and would like to go over results   ? ?KNEE PAIN ?Duration: about a week ago, but better now ?Involved knee: bilateral ?Mechanism of injury: unknown ?Location:medial ?Onset: sudden ?Severity: severe  ?Quality:  sharp ?Frequency: constant ?Radiation: yes- into her calf ?Aggravating factors: walking  ?Alleviating factors: naproxen  ?Status: better ?Treatments attempted: naproxen  ?Relief with NSAIDs?:  significant ?Weakness with weight bearing or walking: no ?Sensation of giving way: no ?Locking: no ?Popping: yes ?Bruising: no ?Swelling: no ?Redness: no ?Paresthesias/decreased sensation: no ?Fevers: no ? ?Relevant past medical, surgical, family and social history reviewed and updated as indicated. Interim medical history since our last visit reviewed. ?Allergies and medications reviewed and updated. ? ?Review of Systems  ?Constitutional: Negative.   ?Respiratory: Negative.    ?Cardiovascular: Negative.   ?Gastrointestinal: Negative.   ?Musculoskeletal:  Positive for arthralgias and gait problem. Negative for back pain, joint swelling, myalgias, neck pain and neck stiffness.  ?Skin: Negative.   ?Psychiatric/Behavioral: Negative.    ? ?Per HPI unless specifically indicated above ? ?   ?Objective:  ?  ?BP 124/75   Pulse 83   Temp 97.9 ?F (36.6 ?C)   Wt 208 lb 9.6 oz (94.6 kg)   LMP  (LMP Unknown)   SpO2 98%   BMI 35.81 kg/m?   ?Wt Readings from Last 3 Encounters:  ?07/08/21 208 lb 9.6 oz (94.6  kg)  ?06/30/21 205 lb (93 kg)  ?06/29/21 212 lb (96.2 kg)  ?  ?Physical Exam ?Vitals and nursing note reviewed.  ?Constitutional:   ?   General: She is not in acute distress. ?   Appearance: Normal appearance. She is well-developed.  ?HENT:  ?   Head: Normocephalic and atraumatic.  ?   Right Ear: Hearing and external ear normal.  ?   Left Ear: Hearing and external ear normal.  ?   Nose: Nose normal.  ?   Mouth/Throat:  ?   Mouth: Mucous membranes are moist.  ?   Pharynx: Oropharynx is clear.  ?Eyes:  ?   General: Lids are normal. No scleral icterus.    ?   Right eye: No discharge.     ?   Left eye: No discharge.  ?   Conjunctiva/sclera: Conjunctivae normal.  ?Pulmonary:  ?   Effort: Pulmonary effort is normal. No respiratory distress.  ?Musculoskeletal:     ?   General: No swelling, tenderness, deformity or signs of injury. Normal range of motion.  ?   Right lower leg: No edema.  ?   Left lower leg: No edema.  ?Skin: ?   Coloration: Skin is not jaundiced or pale.  ?   Findings: No bruising, erythema, lesion or rash.  ?Neurological:  ?   Mental Status: She is alert and oriented to person, place,  and time. Mental status is at baseline.  ?Psychiatric:     ?   Mood and Affect: Mood normal.     ?   Speech: Speech normal.     ?   Behavior: Behavior normal.     ?   Thought Content: Thought content normal.     ?   Judgment: Judgment normal.  ? ? ?Results for orders placed or performed in visit on 06/30/21  ?Microscopic Examination  ? Urine  ?Result Value Ref Range  ? WBC, UA 0-5 0 - 5 /hpf  ? RBC 3-10 (A) 0 - 2 /hpf  ? Epithelial Cells (non renal) 0-10 0 - 10 /hpf  ? Casts Present (A) None seen /lpf  ? Cast Type Granular casts (A) N/A  ? Bacteria, UA Few (A) None seen/Few  ?Urinalysis, Complete  ?Result Value Ref Range  ? Specific Gravity, UA >1.030 (H) 1.005 - 1.030  ? pH, UA 5.5 5.0 - 7.5  ? Color, UA Yellow Yellow  ? Appearance Ur Clear Clear  ? Leukocytes,UA Negative Negative  ? Protein,UA Negative Negative/Trace  ?  Glucose, UA Negative Negative  ? Ketones, UA Negative Negative  ? RBC, UA 2+ (A) Negative  ? Bilirubin, UA Negative Negative  ? Urobilinogen, Ur 0.2 0.2 - 1.0 mg/dL  ? Nitrite, UA Negative Negative  ? Microscopic Examination See below:   ? ?   ?Assessment & Plan:  ? ?Problem List Items Addressed This Visit   ?None ?Visit Diagnoses   ? ? Acute pain of both knees    -  Primary  ? Significantly better. Will check for tick diseases and and get her x-rays if it continues. Continue voltaren. Continue to monitor. Call with any concerns.  ? Relevant Orders  ? Lyme Disease Serology w/Reflex  ? Babesia microti Antibody Panel  ? Rocky mtn spotted fvr abs pnl(IgG+IgM)  ? Ehrlichia Antibody Panel  ? DG Knee Complete 4 Views Right  ? DG Knee Complete 4 Views Left  ? ?  ?  ? ?Follow up plan: ?Return if symptoms worsen or fail to improve. ? ? ? ? ? ?

## 2021-07-12 LAB — ROCKY MTN SPOTTED FVR ABS PNL(IGG+IGM)
RMSF IgG: NEGATIVE
RMSF IgM: 0.61 index (ref 0.00–0.89)

## 2021-07-12 LAB — EHRLICHIA ANTIBODY PANEL
E. Chaffeensis (HME) IgM Titer: NEGATIVE
E.Chaffeensis (HME) IgG: NEGATIVE
HGE IgG Titer: NEGATIVE
HGE IgM Titer: NEGATIVE

## 2021-07-12 LAB — LYME DISEASE SEROLOGY W/REFLEX: Lyme Total Antibody EIA: NEGATIVE

## 2021-07-12 LAB — BABESIA MICROTI ANTIBODY PANEL
Babesia microti IgG: <1:10 {titer}
Babesia microti IgM: <1:10 {titer}

## 2021-07-21 ENCOUNTER — Ambulatory Visit
Admission: RE | Admit: 2021-07-21 | Discharge: 2021-07-21 | Disposition: A | Discharge status: Home / Self Care | Payer: BC Managed Care – PPO | Source: Ambulatory Visit | Attending: Urology | Admitting: Urology

## 2021-07-21 DIAGNOSIS — R3129 Other microscopic hematuria: Secondary | ICD-10-CM | POA: Insufficient documentation

## 2021-07-21 DIAGNOSIS — N200 Calculus of kidney: Secondary | ICD-10-CM | POA: Insufficient documentation

## 2021-07-21 LAB — POCT I-STAT CREATININE: Creatinine, Ser: 1.1 mg/dL — ABNORMAL HIGH (ref 0.44–1.00)

## 2021-07-21 MED ORDER — IOHEXOL 300 MG/ML  SOLN
125.0000 mL | Freq: Once | INTRAMUSCULAR | Status: AC | PRN
  Administered 2021-07-21: 125 mL via INTRAVENOUS

## 2021-07-23 ENCOUNTER — Encounter: Payer: Self-pay | Admitting: *Deleted

## 2021-07-23 ENCOUNTER — Encounter: Payer: Self-pay | Admitting: Family Medicine

## 2021-07-30 ENCOUNTER — Ambulatory Visit (INDEPENDENT_AMBULATORY_CARE_PROVIDER_SITE_OTHER): Payer: BC Managed Care – PPO | Admitting: Family Medicine

## 2021-07-30 ENCOUNTER — Encounter: Payer: Self-pay | Admitting: Family Medicine

## 2021-07-30 ENCOUNTER — Ambulatory Visit: Payer: Self-pay

## 2021-07-30 VITALS — BP 123/78 | HR 85 | Temp 98.0°F | Wt 211.2 lb

## 2021-07-30 DIAGNOSIS — J069 Acute upper respiratory infection, unspecified: Secondary | ICD-10-CM

## 2021-07-30 DIAGNOSIS — N200 Calculus of kidney: Secondary | ICD-10-CM | POA: Diagnosis not present

## 2021-07-30 MED ORDER — BENZONATATE 200 MG PO CAPS: 200.0000 mg | ORAL_CAPSULE | Freq: Two times a day (BID) | ORAL | 0 refills | Status: DC | PRN

## 2021-07-30 MED ORDER — PREDNISONE 50 MG PO TABS: 50.0000 mg | ORAL_TABLET | Freq: Every day | ORAL | 0 refills | Status: DC

## 2021-07-30 NOTE — Progress Notes (Signed)
? ?BP 123/78   Pulse 85   Temp 98 ?F (36.7 ?C)   Wt 211 lb 3.2 oz (95.8 kg)   LMP  (LMP Unknown)   SpO2 96%   BMI 36.25 kg/m?   ? ?Subjective:  ? ? Patient ID: Tracie Sheppard, female    DOB: 10/07/1955, 66 y.o.   MRN: [id number] ? ?HPI: ?CARLENE BICKLEY is a 66 y.o. female ? ?Chief Complaint  ?Patient presents with  ? Cough  ?  Patient states she started coughing on Tuesday, has congestion and shortness of breath. Patient states she has tried zyrtec and tylenol. Patient states she took a COVID test Tuesday   ? ?UPPER RESPIRATORY TRACT INFECTION ?Duration: about a week ?Worst symptom: cough ?Fever: no ?Cough: yes ?Shortness of breath: yes ?Wheezing: yes ?Chest pain: yes ?Chest tightness: yes ?Chest congestion: no ?Nasal congestion: no ?Runny nose: yes ?Post nasal drip: yes ?Sneezing: no ?Sore throat: yes ?Swollen glands: no ?Sinus pressure: no ?Headache: yes ?Face pain: no ?Toothache: yes ?Ear pain: no  ?Ear pressure: no  ?Eyes red/itching:no ?Eye drainage/crusting: no  ?Vomiting: no ?Rash: no ?Fatigue: yes ?Sick contacts: no ?Strep contacts: no  ?Context: stable ?Recurrent sinusitis: no ?Relief with OTC cold/cough medications: no  ?Treatments attempted: flonase, zyrtec, tussionex  ? ?Had a CT done for hematuria last week. Is anxious about the results and would like to discuss.  ? ?Relevant past medical, surgical, family and social history reviewed and updated as indicated. Interim medical history since our last visit reviewed. ?Allergies and medications reviewed and updated. ? ?Review of Systems  ?Constitutional:  Positive for fatigue. Negative for activity change, appetite change, chills, diaphoresis, fever and unexpected weight change.  ?HENT:  Positive for congestion, postnasal drip, rhinorrhea and sinus pressure. Negative for dental problem, drooling, ear discharge, ear pain, facial swelling, hearing loss, mouth sores, nosebleeds, sinus pain, sneezing, sore throat, tinnitus, trouble swallowing and voice  change.   ?Eyes: Negative.   ?Respiratory:  Positive for cough, chest tightness, shortness of breath and wheezing. Negative for apnea, choking and stridor.   ?Cardiovascular: Negative.   ?Gastrointestinal: Negative.   ?Musculoskeletal: Negative.   ?Psychiatric/Behavioral: Negative.    ? ?Per HPI unless specifically indicated above ? ?   ?Objective:  ?  ?BP 123/78   Pulse 85   Temp 98 ?F (36.7 ?C)   Wt 211 lb 3.2 oz (95.8 kg)   LMP  (LMP Unknown)   SpO2 96%   BMI 36.25 kg/m?   ?Wt Readings from Last 3 Encounters:  ?07/30/21 211 lb 3.2 oz (95.8 kg)  ?07/08/21 208 lb 9.6 oz (94.6 kg)  ?06/30/21 205 lb (93 kg)  ?  ?Physical Exam ?Vitals and nursing note reviewed.  ?Constitutional:   ?   General: She is not in acute distress. ?   Appearance: Normal appearance. She is obese. She is not ill-appearing, toxic-appearing or diaphoretic.  ?HENT:  ?   Head: Normocephalic and atraumatic.  ?   Right Ear: Ear canal and external ear normal. There is impacted cerumen.  ?   Left Ear: Tympanic membrane, ear canal and external ear normal.  ?   Nose: Congestion and rhinorrhea present.  ?   Mouth/Throat:  ?   Mouth: Mucous membranes are moist.  ?   Pharynx: Oropharynx is clear. No oropharyngeal exudate or posterior oropharyngeal erythema.  ?Eyes:  ?   General: No scleral icterus.    ?   Right eye: No discharge.     ?  Left eye: No discharge.  ?   Extraocular Movements: Extraocular movements intact.  ?   Conjunctiva/sclera: Conjunctivae normal.  ?   Pupils: Pupils are equal, round, and reactive to light.  ?Cardiovascular:  ?   Rate and Rhythm: Normal rate and regular rhythm.  ?   Pulses: Normal pulses.  ?   Heart sounds: Normal heart sounds. No murmur heard. ?  No friction rub. No gallop.  ?Pulmonary:  ?   Effort: Pulmonary effort is normal. No respiratory distress.  ?   Breath sounds: Normal breath sounds. No stridor. No wheezing, rhonchi or rales.  ?Chest:  ?   Chest wall: No tenderness.  ?Musculoskeletal:     ?   General: Normal  range of motion.  ?   Cervical back: Normal range of motion and neck supple.  ?Skin: ?   General: Skin is warm and dry.  ?   Capillary Refill: Capillary refill takes less than 2 seconds.  ?   Coloration: Skin is not jaundiced or pale.  ?   Findings: No bruising, erythema, lesion or rash.  ?Neurological:  ?   General: No focal deficit present.  ?   Mental Status: She is alert and oriented to person, place, and time. Mental status is at baseline.  ?Psychiatric:     ?   Mood and Affect: Mood normal.     ?   Behavior: Behavior normal.     ?   Thought Content: Thought content normal.     ?   Judgment: Judgment normal.  ? ? ?Results for orders placed or performed during the hospital encounter of 07/21/21  ?I-STAT creatinine  ?Result Value Ref Range  ? Creatinine, Ser 1.10 (H) 0.44 - 1.00 mg/dL  ? ?   ?Assessment & Plan:  ? ?Problem List Items Addressed This Visit   ?None ?Visit Diagnoses   ? ? Upper respiratory tract infection, unspecified type    -  Primary  ? Will treat with prednisone and tessalon. Has tussionex at home. Call with any concerns or if not getting better.   ? Kidney stone      ? Discussed recent CT. Agree with urology. Reassured patient. Follow up with urology as scheduled.   ? ?  ?  ? ?Follow up plan: ?No follow-ups on file. ? ? ? ? ? ?

## 2021-07-30 NOTE — Telephone Encounter (Signed)
? ? ?  Chief Complaint: Cough,head congestion,SOB. Has tried Zyrtec, Flonase. ?Symptoms: Above ?Frequency: Started last week ?Pertinent Negatives: Patient denies fever ?Disposition: '[]'$ ED /'[]'$ Urgent Care (no appt availability in office) / '[x]'$ Appointment(In office/virtual)/ '[]'$  [facility]/ '[]'$ Home Care/ '[]'$ Refused Recommended Disposition /'[]'$ [facility]/ '[]'$  Follow-up with PCP ?Additional Notes: Go to ED for worsening of symptoms.  ?Reason for Disposition ? [1] MILD difficulty breathing (e.g., minimal/no SOB at rest, SOB with walking, pulse <100) AND [2] still present when not coughing ? ?Answer Assessment - Initial Assessment Questions ?1. ONSET: "When did the cough begin?"  ?    Last week ?2. SEVERITY: "How bad is the cough today?"  ?    Severe ?3. SPUTUM: "Describe the color of your sputum" (none, dry cough; clear, white, yellow, green) ?    None ?4. HEMOPTYSIS: "Are you coughing up any blood?" If so ask: "How much?" (flecks, streaks, tablespoons, etc.) ?    No ?5. DIFFICULTY BREATHING: "Are you having difficulty breathing?" If Yes, ask: "How bad is it?" (e.g., mild, moderate, severe)  ?  - MILD: No SOB at rest, mild SOB with walking, speaks normally in sentences, can lie down, no retractions, pulse < 100.  ?  - MODERATE: SOB at rest, SOB with minimal exertion and prefers to sit, cannot lie down flat, speaks in phrases, mild retractions, audible wheezing, pulse 100-120.  ?  - SEVERE: Very SOB at rest, speaks in single words, struggling to breathe, sitting hunched forward, retractions, pulse > 120  ?    With coughing, using inhaler ?6. FEVER: "Do you have a fever?" If Yes, ask: "What is your temperature, how was it measured, and when did it start?" ?    No ?7. CARDIAC HISTORY: "Do you have any history of heart disease?" (e.g., heart attack, congestive heart failure)  ?    No ?8. LUNG HISTORY: "Do you have any history of lung disease?"  (e.g., pulmonary embolus, asthma, emphysema) ?    No ?9. PE  RISK FACTORS: "Do you have a history of blood clots?" (or: recent major surgery, recent prolonged travel, bedridden) ?    No ?10. OTHER SYMPTOMS: "Do you have any other symptoms?" (e.g., runny nose, wheezing, chest pain) ?      Runny nose,SOB ?11. PREGNANCY: "Is there any chance you are pregnant?" "When was your last menstrual period?" ?      nO ?12. TRAVEL: "Have you traveled out of the country in the last month?" (e.g., travel history, exposures) ?      No ? ?Protocols used: Cough - Acute Productive-A-AH ? ?

## 2021-07-30 NOTE — Telephone Encounter (Signed)
Discussed at appointment today.

## 2021-08-26 ENCOUNTER — Other Ambulatory Visit: Payer: Self-pay | Admitting: *Deleted

## 2021-08-26 DIAGNOSIS — N200 Calculus of kidney: Secondary | ICD-10-CM

## 2021-08-27 ENCOUNTER — Ambulatory Visit: Payer: BC Managed Care – PPO | Admitting: Urology

## 2021-08-27 ENCOUNTER — Encounter: Payer: Self-pay | Admitting: Urology

## 2021-08-27 VITALS — BP 119/66 | HR 82 | Ht 64.0 in | Wt 211.0 lb

## 2021-08-27 DIAGNOSIS — N200 Calculus of kidney: Secondary | ICD-10-CM

## 2021-08-27 DIAGNOSIS — N201 Calculus of ureter: Secondary | ICD-10-CM | POA: Diagnosis not present

## 2021-08-27 DIAGNOSIS — R310 Gross hematuria: Secondary | ICD-10-CM | POA: Diagnosis not present

## 2021-08-27 LAB — URINALYSIS, COMPLETE
Bilirubin, UA: NEGATIVE
Glucose, UA: NEGATIVE
Ketones, UA: NEGATIVE
Leukocytes,UA: NEGATIVE
Nitrite, UA: NEGATIVE
Protein,UA: NEGATIVE
RBC, UA: NEGATIVE
Specific Gravity, UA: 1.025 (ref 1.005–1.030)
Urobilinogen, Ur: 0.2 mg/dL (ref 0.2–1.0)
pH, UA: 6 (ref 5.0–7.5)

## 2021-08-27 LAB — MICROSCOPIC EXAMINATION

## 2021-08-27 NOTE — Patient Instructions (Addendum)
Cystoscopy Cystoscopy is a procedure that is used to help diagnose and sometimes treat conditions that affect the lower urinary tract. The lower urinary tract includes the bladder and the urethra. The urethra is the tube that drains urine from the bladder. Cystoscopy is done using a thin, tube-shaped instrument with a light and camera at the end (cystoscope). The cystoscope may be hard or flexible, depending on the goal of the procedure. The cystoscope is inserted through the urethra, into the bladder. Cystoscopy may be recommended if you have: Urinary tract infections that keep coming back. Blood in the urine (hematuria). An inability to control when you urinate (urinary incontinence) or an overactive bladder. Unusual cells found in a urine sample. A blockage in the urethra, such as a urinary stone. Painful urination. An abnormality in the bladder found during an intravenous pyelogram (IVP) or CT scan. What are the risks? Generally, this is a safe procedure. However, problems may occur, including: Infection. Bleeding.  What happens during the procedure?  You will be given one or more of the following: A medicine to numb the area (local anesthetic). The area around the opening of your urethra will be cleaned. The cystoscope will be passed through your urethra into your bladder. Germ-free (sterile) fluid will flow through the cystoscope to fill your bladder. The fluid will stretch your bladder so that your health care provider can clearly examine your bladder walls. Your doctor will look at the urethra and bladder. The cystoscope will be removed The procedure may vary among health care providers  What can I expect after the procedure? After the procedure, it is common to have: Some soreness or pain in your urethra. Urinary symptoms. These include: Mild pain or burning when you urinate. Pain should stop within a few minutes after you urinate. This may last for up to a few days after the  procedure. A small amount of blood in your urine for several days. Feeling like you need to urinate but producing only a small amount of urine. Follow these instructions at home: General instructions Return to your normal activities as told by your health care provider.  Drink plenty of fluids after the procedure. Keep all follow-up visits as told by your health care provider. This is important. Contact a health care provider if you: Have pain that gets worse or does not get better with medicine, especially pain when you urinate lasting longer than 72 hours after the procedure. Have trouble urinating. Get help right away if you: Have blood clots in your urine. Have a fever or chills. Are unable to urinate. Summary Cystoscopy is a procedure that is used to help diagnose and sometimes treat conditions that affect the lower urinary tract. Cystoscopy is done using a thin, tube-shaped instrument with a light and camera at the end. After the procedure, it is common to have some soreness or pain in your urethra. It is normal to have blood in your urine after the procedure.  If you were prescribed an antibiotic medicine, take it as told by your health care provider.  This information is not intended to replace advice given to you by your health care provider. Make sure you discuss any questions you have with your health care provider. Document Revised: 03/13/2018 Document Reviewed: 03/13/2018 Elsevier Patient Education  2020 [employer] [facility]   You will receive a box/kit in the mail that will have a urine jug and instructions in the kit.  When the box  arrives you will need to call our office [phone] to schedule a LAB appointment.   You will need to do a 24hour urine and this should be done during the days that our office will be open.  For example any day from Sunday through Thursday.   If you take Vitamin C 172m or greater please  stop this 5 days prior to collection.   How to collect the urine sample: On the day you start the urine sample this 1st morning urine should NOT be collected.  For the rest of the day including all night urines should be collected.  On the next morning the 1st urine should be collected and then you will be finished with the urine collections.   You will need to bring the box with you on your LAB appointment day after urine has been collected and all instructions are complete in the box.  Your blood will be drawn and the box will be collected by our Lab employee to be sent off for analysis.   When urine and blood is complete you will need to schedule a follow up appointment for lab results.

## 2021-08-29 ENCOUNTER — Encounter: Payer: Self-pay | Admitting: Urology

## 2021-08-29 NOTE — Progress Notes (Signed)
08/27/2021 10:20 AM   Tracie Sheppard 02-13-1956 [id number]  Referring provider: Valerie Roys, DO [place],  [place] [address]  Chief Complaint  Patient presents with   Follow-up    54mofollow up    HPI: 66y.o. female presents for follow-up visit.  Follow-up CT showed resolution of the 4 mm proximal ureteral calculus.  She did have small, bilateral renal calculi and a 2 mm calculus at the right UPJ Question of papillary necrosis with multiple low-density filling defects Presently denies flank or abdominal pain No gross hematuria   PMH: Past Medical History:  Diagnosis Date   Allergy    Seasonal   Anxiety    Bilateral carpal tunnel syndrome 05/30/2017   Chronic back pain    Depression, major, recurrent, moderate (HCC)    Diabetes mellitus without complication (HCC)    Prediabetic   High serum testosterone    HTN (hypertension) 09/12/2019   Kidney stone    Sleep apnea    Thyroid disease    did not show up in lab work but per other symptoms previous doctor started her on this    Surgical History: Past Surgical History:  Procedure Laterality Date   CHOLECYSTECTOMY     1997   COLONOSCOPY WITH PROPOFOL N/A 11/16/2018   Procedure: COLONOSCOPY WITH PROPOFOL;  Surgeon: VLin Landsman MD;  [facility]  Service: Gastroenterology;  Laterality: N/A;   ESOPHAGOGASTRODUODENOSCOPY (EGD) WITH PROPOFOL N/A 11/16/2018   Procedure: ESOPHAGOGASTRODUODENOSCOPY (EGD) WITH PROPOFOL;  Surgeon: VLin Landsman MD;  [facility];  Service: Gastroenterology;  Laterality: N/A;   NASAL SEPTUM SURGERY     OOPHORECTOMY     TUBAL LIGATION     1989    Home Medications:  Allergies as of 08/27/2021       Reactions   Aleve [naproxen Sodium] Other (See Comments)   Chest pain   Statins Other (See Comments)   myalgias        Medication List        Accurate as of Aug 27, 2021 11:59 PM. If you have any questions, ask your nurse or doctor.           STOP taking these medications    predniSONE 50 MG tablet Commonly known as: DELTASONE Stopped by: SAbbie Sons MD       TAKE these medications    acetaminophen 500 MG tablet Commonly known as: TYLENOL Tylenol Extra Strength 500 mg tablet   albuterol 108 (90 Base) MCG/ACT inhaler Commonly known as: VENTOLIN HFA Inhale 2 puffs into the lungs every 6 (six) hours as needed for wheezing or shortness of breath.   Alpha-Lipoic Acid 600 MG Caps Take 600 mg by mouth daily.   benzonatate 200 MG capsule Commonly known as: TESSALON Take 1 capsule (200 mg total) by mouth 2 (two) times daily as needed for cough.   buPROPion 300 MG 24 hr tablet Commonly known as: WELLBUTRIN XL Take 1 tablet (300 mg total) by mouth daily.   cetirizine 10 MG tablet Commonly known as: ZYRTEC Take 1 tablet (10 mg total) by mouth daily as needed for allergies.   citalopram 40 MG tablet Commonly known as: CELEXA Take 1.5 tablets (60 mg total) by mouth daily.   clonazePAM 0.5 MG tablet Commonly known as: KLONOPIN TAKE 1 TABLET BY MOUTH DAILY AS NEEDED FOR ANIXETY   Cyanocobalamin 1000 MCG Tbcr Take 1,000 mcg by mouth daily.   fluticasone 50 MCG/ACT nasal spray  Commonly known as: FLONASE SPRAY 1 SPRAY INTO EACH NOSTRIL EVERY DAY   ibuprofen 200 MG tablet Commonly known as: ADVIL Take 200 mg by mouth as needed.   propranolol ER 60 MG 24 hr capsule Commonly known as: INDERAL LA Take 1 tablet by mouth daily.   spironolactone 100 MG tablet Commonly known as: ALDACTONE Take 1 tablet (100 mg total) by mouth 2 (two) times daily.   VITAMIN D PO Take by mouth daily. 1000 mcg        Allergies:  Allergies  Allergen Reactions   Aleve [Naproxen Sodium] Other (See Comments)    Chest pain   Statins Other (See Comments)    myalgias    Family History: Family History  Problem Relation Age of Onset   Diabetes Mother    Anemia Mother    Hyperlipidemia Mother    Arthritis Mother     Hypertension Father    Cancer Father    Celiac disease Sister    Diabetes Maternal Grandmother    Stroke Maternal Grandmother    Heart attack Maternal Grandmother    Heart disease Paternal Grandfather     Social History:  reports that she has never smoked. She has never used smokeless tobacco. She reports current alcohol use. She reports that she does not use drugs.   Physical Exam: BP 119/66   Pulse 82   Ht '5\' 4"'$  (1.626 m)   Wt 211 lb (95.7 kg)   LMP  (LMP Unknown)   BMI 36.22 kg/m   Constitutional:  Alert and oriented, No acute distress. HEENT: [place] AT, moist mucus membranes.  Trachea midline, no masses. Cardiovascular: No clubbing, cyanosis, or edema. Respiratory: Normal respiratory effort, no increased work of breathing. Psychiatric: Normal mood and affect.   Assessment & Plan:    1.  Bilateral nephrolithiasis Small 2 mm calculus right UPJ which may have passed  2.  Gross hematuria Urinalysis today is clear Schedule cystoscopy to complete lower tract evaluation for hematuria CTU with questionable evidence of papillary necrosis.  Will discuss follow-up imaging versus ureteroscopy at next appointment   [name], [place] [address] [place], [place] [address] [phone]

## 2021-09-03 ENCOUNTER — Ambulatory Visit: Payer: BC Managed Care – PPO | Admitting: Urology

## 2021-09-03 ENCOUNTER — Encounter: Payer: Self-pay | Admitting: Urology

## 2021-09-03 VITALS — BP 134/76 | HR 78 | Ht 64.0 in | Wt 208.0 lb

## 2021-09-03 DIAGNOSIS — R3129 Other microscopic hematuria: Secondary | ICD-10-CM

## 2021-09-03 HISTORY — PX: CYSTOSCOPY: SUR368

## 2021-09-03 LAB — MICROSCOPIC EXAMINATION

## 2021-09-03 LAB — URINALYSIS, COMPLETE
Bilirubin, UA: NEGATIVE
Glucose, UA: NEGATIVE
Ketones, UA: NEGATIVE
Leukocytes,UA: NEGATIVE
Nitrite, UA: NEGATIVE
Protein,UA: NEGATIVE
Specific Gravity, UA: 1.03 (ref 1.005–1.030)
Urobilinogen, Ur: 0.2 mg/dL (ref 0.2–1.0)
pH, UA: 5.5 (ref 5.0–7.5)

## 2021-09-03 NOTE — Progress Notes (Signed)
   09/03/21  CC:  Chief Complaint  Patient presents with   Cysto    HPI: Refer to prior note 08/27/2021.  Blood pressure 134/76, pulse 78, height '5\' 4"'$  (1.626 m), weight 208 lb (94.3 kg). NED. A&Ox3.   No respiratory distress   Abd soft, NT, ND Normal external genitalia with patent urethral meatus  Cystoscopy Procedure Note  Patient identification was confirmed, informed consent was obtained, and patient was prepped using Betadine solution.  Lidocaine jelly was administered per urethral meatus.    Procedure: - Flexible cystoscope introduced, with moderate difficulty secondary to a tight urethra - Thorough search of the bladder revealed:    normal urethral meatus    normal urothelium    no stones    no ulcers     no tumors    no urethral polyps    no trabeculation  - Ureteral orifices were normal in position and appearance.  Post-Procedure: - Patient tolerated the procedure well  Assessment/ Plan: No bladder mucosal abnormalities noted Microhematuria most likely secondary to nephrolithiasis Proceed with 24-hour urine study Follow-up KUB/UA 3-4 months Consider repeat CTU for worsening hematuria    [name], MD

## 2021-09-24 ENCOUNTER — Ambulatory Visit: Payer: BC Managed Care – PPO | Admitting: Family Medicine

## 2021-09-28 ENCOUNTER — Ambulatory Visit: Payer: BC Managed Care – PPO | Admitting: Family Medicine

## 2021-09-28 ENCOUNTER — Encounter: Payer: Self-pay | Admitting: Family Medicine

## 2021-09-28 ENCOUNTER — Other Ambulatory Visit (HOSPITAL_COMMUNITY)
Admission: RE | Admit: 2021-09-28 | Discharge: 2021-09-28 | Disposition: A | Discharge status: Home / Self Care | Payer: BC Managed Care – PPO | Source: Ambulatory Visit | Attending: Family Medicine | Admitting: Family Medicine

## 2021-09-28 VITALS — BP 122/80 | HR 81 | Temp 97.8°F | Wt 211.2 lb

## 2021-09-28 DIAGNOSIS — F331 Major depressive disorder, recurrent, moderate: Secondary | ICD-10-CM

## 2021-09-28 DIAGNOSIS — D485 Neoplasm of uncertain behavior of skin: Secondary | ICD-10-CM | POA: Diagnosis present

## 2021-09-28 DIAGNOSIS — M25571 Pain in right ankle and joints of right foot: Secondary | ICD-10-CM | POA: Diagnosis not present

## 2021-09-28 MED ORDER — SPIRONOLACTONE 100 MG PO TABS: 100.0000 mg | ORAL_TABLET | Freq: Two times a day (BID) | ORAL | 0 refills | Status: DC

## 2021-09-28 MED ORDER — CLONAZEPAM 0.5 MG PO TABS: ORAL_TABLET | ORAL | 0 refills | Status: DC

## 2021-09-28 MED ORDER — CITALOPRAM HYDROBROMIDE 40 MG PO TABS: 60.0000 mg | ORAL_TABLET | Freq: Every day | ORAL | 0 refills | Status: DC

## 2021-09-28 MED ORDER — ALBUTEROL SULFATE HFA 108 (90 BASE) MCG/ACT IN AERS: 2.0000 | INHALATION_SPRAY | Freq: Four times a day (QID) | RESPIRATORY_TRACT | 0 refills | Status: DC | PRN

## 2021-09-28 MED ORDER — BUPROPION HCL ER (XL) 300 MG PO TB24: 300.0000 mg | ORAL_TABLET | Freq: Every day | ORAL | 0 refills | Status: DC

## 2021-09-29 LAB — SURGICAL PATHOLOGY

## 2021-10-20 ENCOUNTER — Ambulatory Visit: Payer: BC Managed Care – PPO | Admitting: Family Medicine

## 2021-10-20 ENCOUNTER — Encounter: Payer: Self-pay | Admitting: Family Medicine

## 2021-10-20 VITALS — BP 109/73 | HR 64 | Temp 97.9°F | Wt 210.8 lb

## 2021-10-20 DIAGNOSIS — M9905 Segmental and somatic dysfunction of pelvic region: Secondary | ICD-10-CM | POA: Diagnosis not present

## 2021-10-20 DIAGNOSIS — M9903 Segmental and somatic dysfunction of lumbar region: Secondary | ICD-10-CM

## 2021-10-20 DIAGNOSIS — M9906 Segmental and somatic dysfunction of lower extremity: Secondary | ICD-10-CM

## 2021-10-20 DIAGNOSIS — M9902 Segmental and somatic dysfunction of thoracic region: Secondary | ICD-10-CM

## 2021-10-20 DIAGNOSIS — M545 Low back pain, unspecified: Secondary | ICD-10-CM | POA: Diagnosis not present

## 2021-10-20 DIAGNOSIS — M9904 Segmental and somatic dysfunction of sacral region: Secondary | ICD-10-CM | POA: Diagnosis not present

## 2021-10-20 DIAGNOSIS — M9908 Segmental and somatic dysfunction of rib cage: Secondary | ICD-10-CM | POA: Diagnosis not present

## 2021-10-20 DIAGNOSIS — M9909 Segmental and somatic dysfunction of abdomen and other regions: Secondary | ICD-10-CM

## 2021-10-20 MED ORDER — TRAMADOL HCL 50 MG PO TABS: 50.0000 mg | ORAL_TABLET | Freq: Three times a day (TID) | ORAL | 0 refills | Status: AC | PRN

## 2021-10-20 MED ORDER — KETOROLAC TROMETHAMINE 60 MG/2ML IM SOLN
60.0000 mg | Freq: Once | INTRAMUSCULAR | Status: AC
  Administered 2021-10-20: 60 mg via INTRAMUSCULAR

## 2021-10-20 MED ORDER — DICLOFENAC SODIUM 75 MG PO TBEC: 75.0000 mg | DELAYED_RELEASE_TABLET | Freq: Two times a day (BID) | ORAL | 1 refills | Status: DC

## 2021-10-20 MED ORDER — CYCLOBENZAPRINE HCL 10 MG PO TABS: 5.0000 mg | ORAL_TABLET | Freq: Every day | ORAL | 0 refills | Status: DC

## 2021-10-20 NOTE — Progress Notes (Signed)
BP 109/73   Pulse 64   Temp 97.9 F (36.6 C)   Wt 210 lb 12.8 oz (95.6 kg)   LMP  (LMP Unknown)   SpO2 98%   BMI 36.18 kg/m    Subjective:    Patient ID: Tracie Sheppard, female    DOB: January 20, 1956, 67 y.o.   MRN: [id number]  HPI: Tracie Sheppard is a 66 y.o. female  Chief Complaint  Patient presents with   Back Pain   BACK PAIN Duration: chronic- worse in the past 2 weeks Mechanism of injury: unknown Location: low back Onset: gradual Severity: moderate Quality: aching and sore Frequency: constant Radiation: pelvis and bilateral legs Aggravating factors: movement, walking Alleviating factors: rest Status: worse Treatments attempted: rest, ice, heat, APAP, and ibuprofen  Relief with NSAIDs?: mild Nighttime pain:  no Paresthesias / decreased sensation:  yes Bowel / bladder incontinence:  no Fevers:  no Dysuria / urinary frequency:  no   Relevant past medical, surgical, family and social history reviewed and updated as indicated. Interim medical history since our last visit reviewed. Allergies and medications reviewed and updated.  Review of Systems  Constitutional: Negative.   Respiratory: Negative.    Cardiovascular: Negative.   Gastrointestinal: Negative.   Musculoskeletal:  Positive for arthralgias, back pain and myalgias. Negative for gait problem, joint swelling, neck pain and neck stiffness.  Skin: Negative.   Neurological: Negative.   Psychiatric/Behavioral: Negative.      Per HPI unless specifically indicated above     Objective:    BP 109/73   Pulse 64   Temp 97.9 F (36.6 C)   Wt 210 lb 12.8 oz (95.6 kg)   LMP  (LMP Unknown)   SpO2 98%   BMI 36.18 kg/m   Wt Readings from Last 3 Encounters:  10/20/21 210 lb 12.8 oz (95.6 kg)  09/28/21 211 lb 3.2 oz (95.8 kg)  09/03/21 208 lb (94.3 kg)    Physical Exam Vitals and nursing note reviewed.  Constitutional:      General: She is not in acute distress.    Appearance: Normal appearance. She  is obese. She is not ill-appearing.  HENT:     Head: Normocephalic and atraumatic.     Right Ear: External ear normal.     Left Ear: External ear normal.     Nose: Nose normal.     Mouth/Throat:     Mouth: Mucous membranes are moist.     Pharynx: Oropharynx is clear.  Eyes:     Extraocular Movements: Extraocular movements intact.     Conjunctiva/sclera: Conjunctivae normal.     Pupils: Pupils are equal, round, and reactive to light.  Neck:     Vascular: No carotid bruit.  Cardiovascular:     Rate and Rhythm: Normal rate.     Pulses: Normal pulses.  Pulmonary:     Effort: Pulmonary effort is normal. No respiratory distress.  Abdominal:     General: Abdomen is flat. There is no distension.     Palpations: Abdomen is soft. There is no mass.     Tenderness: There is no abdominal tenderness. There is no right CVA tenderness, left CVA tenderness, guarding or rebound.     Hernia: No hernia is present.  Musculoskeletal:     Cervical back: No muscular tenderness.  Lymphadenopathy:     Cervical: No cervical adenopathy.  Skin:    General: Skin is warm and dry.     Capillary Refill: Capillary refill takes less than  2 seconds.     Coloration: Skin is not jaundiced or pale.     Findings: No bruising, erythema, lesion or rash.  Neurological:     General: No focal deficit present.     Mental Status: She is alert. Mental status is at baseline.  Psychiatric:        Mood and Affect: Mood normal.        Behavior: Behavior normal.        Thought Content: Thought content normal.        Judgment: Judgment normal.   Musculoskeletal:  Exam found Decreased ROM, Tissue texture changes, Tenderness to palpation, and Asymmetry of patient's  thorax, ribs, lumbar, pelvis, sacrum, lower extremity, and abdomen Osteopathic Structural Exam:   Thorax: T4-6 SRRL  Ribs: Ribs 5-7 locked up on the L, Rib 6 locked up on the R  Lumbar: QL and psoas hypertonic on the R, L2-5SRRL  Pelvis: Posterior R  innominate  Sacrum: R on R torsion  Lower Extremity: IT band hypertonic on the R  Abdomen: diaphragm hypertonic R>L   Results for orders placed or performed in visit on 09/28/21  Surgical pathology  Result Value Ref Range   SURGICAL PATHOLOGY      SURGICAL PATHOLOGY CASE: [id number] PATIENT: Tracie Sheppard Surgical Pathology Report     Clinical History: neoplasm of uncertain behavior of skin, 2 mm hyperpigmented irregular mole on left hip (cm)     FINAL MICROSCOPIC DIAGNOSIS:  A. 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 Negative for neoplasm. Margins of resection are normal.   GROSS DESCRIPTION:  Received in formalin is a 0.4 x 0.3 x 0.1 cm tan skin with a central 0.3 x 0.2 cm brown-black ill-defined macule.  In toto 1 block.  SW 09/28/2021   Final Diagnosis performed by Unknown Jim, MD.   Electronically signed 09/29/2021 Technical and / or Professional components performed at [facility]. [facility], [place] [address] [place], [place] [address].  Immunohistochemistry Technical component (if applicable) was performed at [facility]. [address] [place], [place], [place] [address].   IMMUNOHISTOCHEMISTRY DISCLAIMER (if a pplicable): Some of these immunohistochemical stains may have been developed and the performance characteristics determine by [facility]. Some may not have been cleared or approved by the U.S. Food and Drug Administration. The FDA has determined that such clearance or approval is not necessary. This test is used for clinical purposes. It should not be regarded as investigational or for research. This laboratory is certified under the [place] (CLIA-88) as qualified to perform high complexity clinical laboratory testing.  The controls stained appropriately.       Assessment & Plan:   Problem List Items Addressed  This Visit       Other   Low back pain - Primary    In exacerbation. Did PT last year. May need MRI. Will treat with toradol today. Baclofen, voltaren and PRN tramadol given today. She does have somatic dysfunction that is contributing to her symptoms. She was treated today with good results. She will let us know if she wants to repeat PT or go have an MRI.       Relevant Medications   diclofenac (VOLTAREN) 75 MG EC tablet   traMADol (ULTRAM) 50 MG tablet   cyclobenzaprine (FLEXERIL) 10 MG tablet   Other Visit Diagnoses     Somatic dysfunction of lumbar region       Somatic  dysfunction of sacral region       Somatic dysfunction of pelvis region       Rib cage region somatic dysfunction       Somatic dysfunction of lower extremities       Thoracic segment dysfunction       Segmental dysfunction of abdomen          After verbal consent was obtained, patient was treated today with osteopathic manipulative medicine to the regions of the thorax, ribs, lumbar, pelvis, sacrum, abdomen, and lower extremity using the techniques of myofascial release, counterstrain, muscle energy, HVLA, and soft tissue. Areas of compensation relating to her primary pain source also treated. Patient tolerated the procedure well with fair objective and fair subjective improvement in symptoms. She left the room in good condition. She was advised to stay well hydrated and that she may have some soreness following the procedure. If not improving or worsening, she will call and come in. Home exercise program of stretches for lumbar discussed and demonstrated today. Patient will do these stretches BID to before the point of pain, and will return for reevaluation   in 2-3 weeks.   Follow up plan: Return in about 2 weeks (around 11/03/2021) for 43mn.

## 2021-10-21 ENCOUNTER — Encounter: Payer: Self-pay | Admitting: Family Medicine

## 2021-10-21 ENCOUNTER — Ambulatory Visit: Payer: Self-pay

## 2021-10-21 NOTE — Telephone Encounter (Signed)
She will only be taking tramadol occasionally and for a short period of time. Does not need to stop celexa.

## 2021-10-21 NOTE — Telephone Encounter (Signed)
Patient verbally understood, no further questions.

## 2021-10-21 NOTE — Assessment & Plan Note (Signed)
In exacerbation. Did PT last year. May need MRI. Will treat with toradol today. Baclofen, voltaren and PRN tramadol given today. She does have somatic dysfunction that is contributing to her symptoms. She was treated today with good results. She will let us know if she wants to repeat PT or go have an MRI.

## 2021-10-21 NOTE — Telephone Encounter (Signed)
Summary: discuss medication   Patient has additional questions regarding medication prescribed at yesterdays 7-19 ov   Please fu w/ patient     Pt with questions about medicines prescribed 10/20/21. Pt stated her pharmacist advised to stop the Celexa while taking the tramadol. Pt stated her urine is orange in color and asked if her new medication could cause that color. Per [employer], neither medication causes orange colored urine. Advised pt to increase water intake to 6-8 servings of 8 oz of water today and if urine continues to be orange to call back and discuss.  Please either call, leave VM if call unanswered or MyChart message regarding stopping the Celexa while on Tramadol.  Reason for Disposition  [1] Caller has NON-URGENT medicine question about med that PCP prescribed AND [2] triager unable to answer question  Answer Assessment - Initial Assessment Questions 1. NAME of MEDICINE: "What medicine(s) are you calling about?"     Diclofenac and Tramadol 2. QUESTION: "What is your question?" (e.g., double dose of medicine, side effect)     Summary: discuss medication   Patient has additional questions regarding medication prescribed at yesterdays 7-19 ov   Please fu w/ patient     3. PRESCRIBER: "Who prescribed the medicine?" Reason: if prescribed by specialist, call should be referred to that group.     Dr. Wynetta Emery 4. SYMPTOMS: "Do you have any symptoms?" If Yes, ask: "What symptoms are you having?"  "How bad are the symptoms (e.g., mild, moderate, severe)     No sx  Protocols used: Medication Question Call-A-AH

## 2021-11-02 LAB — HM MAMMOGRAPHY

## 2021-11-05 ENCOUNTER — Ambulatory Visit: Payer: BC Managed Care – PPO | Admitting: Family Medicine

## 2021-11-09 ENCOUNTER — Encounter: Payer: Self-pay | Admitting: Family Medicine

## 2021-11-09 ENCOUNTER — Ambulatory Visit: Payer: BC Managed Care – PPO | Admitting: Family Medicine

## 2021-11-09 VITALS — BP 124/76 | HR 84 | Temp 98.4°F | Wt 210.7 lb

## 2021-11-09 DIAGNOSIS — M9908 Segmental and somatic dysfunction of rib cage: Secondary | ICD-10-CM

## 2021-11-09 DIAGNOSIS — M9902 Segmental and somatic dysfunction of thoracic region: Secondary | ICD-10-CM

## 2021-11-09 DIAGNOSIS — M9905 Segmental and somatic dysfunction of pelvic region: Secondary | ICD-10-CM

## 2021-11-09 DIAGNOSIS — M9909 Segmental and somatic dysfunction of abdomen and other regions: Secondary | ICD-10-CM

## 2021-11-09 DIAGNOSIS — M9904 Segmental and somatic dysfunction of sacral region: Secondary | ICD-10-CM

## 2021-11-09 DIAGNOSIS — M545 Low back pain, unspecified: Secondary | ICD-10-CM | POA: Diagnosis not present

## 2021-11-09 DIAGNOSIS — M9903 Segmental and somatic dysfunction of lumbar region: Secondary | ICD-10-CM

## 2021-11-09 NOTE — Progress Notes (Signed)
BP 124/76   Pulse 84   Temp 98.4 F (36.9 C)   Wt 210 lb 11.2 oz (95.6 kg)   LMP  (LMP Unknown)   SpO2 98%   BMI 36.17 kg/m    Subjective:    Patient ID: Tracie Sheppard, female    DOB: 02-14-1956, 66 y.o.   MRN: [id number]  HPI: Tracie Sheppard is a 66 y.o. female  Chief Complaint  Patient presents with   Back Pain    Patient here for OMM , states her back pain is better    [facility] notes that she did well following her last appointment. She was able to go to to [facility] and walk around. She feels significantly better. She continues with low back pain with aching in her lumber area. Does radiate into her leg. Pain is aching and sore. She is otherwise doing OK with no other concerns or complaints at this time.   Relevant past medical, surgical, family and social history reviewed and updated as indicated. Interim medical history since our last visit reviewed. Allergies and medications reviewed and updated.  Review of Systems  Constitutional: Negative.   Respiratory: Negative.    Cardiovascular: Negative.   Gastrointestinal: Negative.   Musculoskeletal:  Positive for back pain and myalgias. Negative for arthralgias, gait problem, joint swelling, neck pain and neck stiffness.  Skin: Negative.   Neurological: Negative.   Psychiatric/Behavioral: Negative.      Per HPI unless specifically indicated above     Objective:    BP 124/76   Pulse 84   Temp 98.4 F (36.9 C)   Wt 210 lb 11.2 oz (95.6 kg)   LMP  (LMP Unknown)   SpO2 98%   BMI 36.17 kg/m   Wt Readings from Last 3 Encounters:  11/09/21 210 lb 11.2 oz (95.6 kg)  10/20/21 210 lb 12.8 oz (95.6 kg)  09/28/21 211 lb 3.2 oz (95.8 kg)    Physical Exam Vitals and nursing note reviewed.  Constitutional:      General: She is not in acute distress.    Appearance: Normal appearance. She is normal weight. She is not ill-appearing.  HENT:     Head: Normocephalic and atraumatic.     Right Ear: External ear normal.      Left Ear: External ear normal.     Nose: Nose normal.     Mouth/Throat:     Mouth: Mucous membranes are moist.     Pharynx: Oropharynx is clear.  Eyes:     Extraocular Movements: Extraocular movements intact.     Conjunctiva/sclera: Conjunctivae normal.     Pupils: Pupils are equal, round, and reactive to light.  Neck:     Vascular: No carotid bruit.  Cardiovascular:     Rate and Rhythm: Normal rate.     Pulses: Normal pulses.  Pulmonary:     Effort: Pulmonary effort is normal. No respiratory distress.  Abdominal:     General: Abdomen is flat. There is no distension.     Palpations: Abdomen is soft. There is no mass.     Tenderness: There is no abdominal tenderness. There is no right CVA tenderness, left CVA tenderness, guarding or rebound.     Hernia: No hernia is present.  Musculoskeletal:     Cervical back: No muscular tenderness.  Lymphadenopathy:     Cervical: No cervical adenopathy.  Skin:    General: Skin is warm and dry.     Capillary Refill: Capillary refill takes less  than 2 seconds.     Coloration: Skin is not jaundiced or pale.     Findings: No bruising, erythema, lesion or rash.  Neurological:     General: No focal deficit present.     Mental Status: She is alert. Mental status is at baseline.  Psychiatric:        Mood and Affect: Mood normal.        Behavior: Behavior normal.        Thought Content: Thought content normal.        Judgment: Judgment normal.   Musculoskeletal:  Exam found Decreased ROM, Tissue texture changes, Tenderness to palpation, and Asymmetry of patient's  thorax, ribs, lumbar, pelvis, sacrum, and abdomen Osteopathic Structural Exam:   Thorax: T5-7SLRR  Ribs: Ribs 5-7 locked up on the R, Rib 6 locked up on the L  Lumbar: L3-4SLRR, QL hypertonic on the R  Pelvis: R innominate anterior   Sacrum: R on R torsion  Abdomen: diaphragm hypertonic on the L   Results for orders placed or performed in visit on 11/04/21  HM MAMMOGRAPHY   Result Value Ref Range   HM Mammogram 0-4 Bi-Rad 0-4 Bi-Rad, Self Reported Normal      Assessment & Plan:   Problem List Items Addressed This Visit       Other   Low back pain - Primary    She does have somatic dysfunction that is contributing to her symptoms. Treated today with good results. Call with any concerns.      Other Visit Diagnoses     Somatic dysfunction of lumbar region       Somatic dysfunction of sacral region       Somatic dysfunction of pelvis region       Rib cage region somatic dysfunction       Thoracic segment dysfunction       Segmental dysfunction of abdomen          After verbal consent was obtained, patient was treated today with osteopathic manipulative medicine to the regions of the thorax, ribs, lumbar, pelvis, sacrum, and abdomen using the techniques of myofascial release, counterstrain, muscle energy, HVLA, and soft tissue. Areas of compensation relating to her primary pain source also treated. Patient tolerated the procedure well with good objective and good subjective improvement in symptoms. She left the room in good condition. She was advised to stay well hydrated and that she may have some soreness following the procedure. She will return for reevaluation  on a PRN basis.   Follow up plan: Return in about 4 weeks (around 12/07/2021) for 9/7 or 9/8 for 40 min please.

## 2021-11-12 ENCOUNTER — Encounter: Payer: Self-pay | Admitting: Family Medicine

## 2021-11-12 NOTE — Assessment & Plan Note (Signed)
She does have somatic dysfunction that is contributing to her symptoms. Treated today with good results. Call with any concerns.

## 2021-11-27 ENCOUNTER — Encounter: Payer: Self-pay | Admitting: Internal Medicine

## 2021-11-27 ENCOUNTER — Other Ambulatory Visit: Payer: Self-pay

## 2021-11-27 ENCOUNTER — Emergency Department: Payer: BC Managed Care – PPO

## 2021-11-27 ENCOUNTER — Inpatient Hospital Stay
Admission: EM | Admit: 2021-11-27 | Discharge: 2021-12-01 | DRG: 872 | Disposition: A | Discharge status: Home / Self Care | Payer: BC Managed Care – PPO | Attending: Internal Medicine | Admitting: Internal Medicine

## 2021-11-27 DIAGNOSIS — N179 Acute kidney failure, unspecified: Secondary | ICD-10-CM | POA: Diagnosis present

## 2021-11-27 DIAGNOSIS — G4730 Sleep apnea, unspecified: Secondary | ICD-10-CM | POA: Diagnosis present

## 2021-11-27 DIAGNOSIS — Z87442 Personal history of urinary calculi: Secondary | ICD-10-CM

## 2021-11-27 DIAGNOSIS — Z8249 Family history of ischemic heart disease and other diseases of the circulatory system: Secondary | ICD-10-CM | POA: Diagnosis not present

## 2021-11-27 DIAGNOSIS — Z9851 Tubal ligation status: Secondary | ICD-10-CM | POA: Diagnosis not present

## 2021-11-27 DIAGNOSIS — F331 Major depressive disorder, recurrent, moderate: Secondary | ICD-10-CM | POA: Diagnosis present

## 2021-11-27 DIAGNOSIS — A419 Sepsis, unspecified organism: Secondary | ICD-10-CM | POA: Diagnosis present

## 2021-11-27 DIAGNOSIS — F419 Anxiety disorder, unspecified: Secondary | ICD-10-CM | POA: Diagnosis present

## 2021-11-27 DIAGNOSIS — R197 Diarrhea, unspecified: Secondary | ICD-10-CM | POA: Diagnosis present

## 2021-11-27 DIAGNOSIS — R6520 Severe sepsis without septic shock: Secondary | ICD-10-CM | POA: Diagnosis present

## 2021-11-27 DIAGNOSIS — R0602 Shortness of breath: Secondary | ICD-10-CM

## 2021-11-27 DIAGNOSIS — R300 Dysuria: Secondary | ICD-10-CM

## 2021-11-27 DIAGNOSIS — I10 Essential (primary) hypertension: Secondary | ICD-10-CM | POA: Diagnosis present

## 2021-11-27 DIAGNOSIS — E119 Type 2 diabetes mellitus without complications: Secondary | ICD-10-CM | POA: Diagnosis present

## 2021-11-27 DIAGNOSIS — Z833 Family history of diabetes mellitus: Secondary | ICD-10-CM

## 2021-11-27 DIAGNOSIS — R251 Tremor, unspecified: Secondary | ICD-10-CM | POA: Diagnosis present

## 2021-11-27 DIAGNOSIS — J302 Other seasonal allergic rhinitis: Secondary | ICD-10-CM | POA: Diagnosis present

## 2021-11-27 DIAGNOSIS — Z83438 Family history of other disorder of lipoprotein metabolism and other lipidemia: Secondary | ICD-10-CM | POA: Diagnosis not present

## 2021-11-27 DIAGNOSIS — F32A Depression, unspecified: Secondary | ICD-10-CM | POA: Diagnosis present

## 2021-11-27 DIAGNOSIS — N390 Urinary tract infection, site not specified: Secondary | ICD-10-CM | POA: Diagnosis present

## 2021-11-27 DIAGNOSIS — E669 Obesity, unspecified: Secondary | ICD-10-CM | POA: Diagnosis present

## 2021-11-27 DIAGNOSIS — Z6835 Body mass index (BMI) 35.0-35.9, adult: Secondary | ICD-10-CM

## 2021-11-27 DIAGNOSIS — G250 Essential tremor: Secondary | ICD-10-CM | POA: Diagnosis present

## 2021-11-27 DIAGNOSIS — N200 Calculus of kidney: Secondary | ICD-10-CM

## 2021-11-27 LAB — BLOOD CULTURE ID PANEL (REFLEXED) - BCID2
A.calcoaceticus-baumannii: NOT DETECTED
Bacteroides fragilis: NOT DETECTED
CTX-M ESBL: NOT DETECTED
Candida albicans: NOT DETECTED
Candida auris: NOT DETECTED
Candida glabrata: NOT DETECTED
Candida krusei: NOT DETECTED
Candida parapsilosis: NOT DETECTED
Candida tropicalis: NOT DETECTED
Carbapenem resist OXA 48 LIKE: NOT DETECTED
Carbapenem resistance IMP: NOT DETECTED
Carbapenem resistance KPC: NOT DETECTED
Carbapenem resistance NDM: NOT DETECTED
Carbapenem resistance VIM: NOT DETECTED
Cryptococcus neoformans/gattii: NOT DETECTED
Enterobacter cloacae complex: NOT DETECTED
Enterococcus Faecium: NOT DETECTED
Enterococcus faecalis: NOT DETECTED
Escherichia coli: DETECTED — AB
Haemophilus influenzae: NOT DETECTED
Klebsiella aerogenes: NOT DETECTED
Klebsiella oxytoca: NOT DETECTED
Klebsiella pneumoniae: NOT DETECTED
Listeria monocytogenes: NOT DETECTED
Neisseria meningitidis: NOT DETECTED
Proteus species: NOT DETECTED
Pseudomonas aeruginosa: NOT DETECTED
Salmonella species: NOT DETECTED
Serratia marcescens: NOT DETECTED
Staphylococcus aureus (BCID): NOT DETECTED
Staphylococcus epidermidis: NOT DETECTED
Staphylococcus lugdunensis: NOT DETECTED
Staphylococcus species: NOT DETECTED
Stenotrophomonas maltophilia: NOT DETECTED
Streptococcus agalactiae: NOT DETECTED
Streptococcus pneumoniae: NOT DETECTED
Streptococcus pyogenes: NOT DETECTED
Streptococcus species: NOT DETECTED

## 2021-11-27 LAB — CBC WITH DIFFERENTIAL/PLATELET
Abs Immature Granulocytes: 0.02 10*3/uL (ref 0.00–0.07)
Basophils Absolute: 0 10*3/uL (ref 0.0–0.1)
Basophils Relative: 1 %
Eosinophils Absolute: 0 10*3/uL (ref 0.0–0.5)
Eosinophils Relative: 0 %
HCT: 40.2 % (ref 36.0–46.0)
Hemoglobin: 13.1 g/dL (ref 12.0–15.0)
Immature Granulocytes: 1 %
Lymphocytes Relative: 11 %
Lymphs Abs: 0.4 10*3/uL — ABNORMAL LOW (ref 0.7–4.0)
MCH: 29.5 pg (ref 26.0–34.0)
MCHC: 32.6 g/dL (ref 30.0–36.0)
MCV: 90.5 fL (ref 80.0–100.0)
Monocytes Absolute: 0 10*3/uL — ABNORMAL LOW (ref 0.1–1.0)
Monocytes Relative: 1 %
Neutro Abs: 3.5 10*3/uL (ref 1.7–7.7)
Neutrophils Relative %: 86 %
Platelets: 174 10*3/uL (ref 150–400)
RBC: 4.44 MIL/uL (ref 3.87–5.11)
RDW: 12.6 % (ref 11.5–15.5)
WBC: 4 10*3/uL (ref 4.0–10.5)
nRBC: 0 % (ref 0.0–0.2)

## 2021-11-27 LAB — URINALYSIS, COMPLETE (UACMP) WITH MICROSCOPIC
Bilirubin Urine: NEGATIVE
Glucose, UA: NEGATIVE mg/dL
Ketones, ur: NEGATIVE mg/dL
Nitrite: NEGATIVE
Protein, ur: 100 mg/dL — AB
RBC / HPF: >50 RBC/hpf — ABNORMAL HIGH (ref 0–5)
Specific Gravity, Urine: 1.018 (ref 1.005–1.030)
WBC, UA: >50 WBC/hpf — ABNORMAL HIGH (ref 0–5)
pH: 5 (ref 5.0–8.0)

## 2021-11-27 LAB — COMPREHENSIVE METABOLIC PANEL
ALT: 32 U/L (ref 0–44)
AST: 32 U/L (ref 15–41)
Albumin: 3.8 g/dL (ref 3.5–5.0)
Alkaline Phosphatase: 88 U/L (ref 38–126)
Anion gap: 8 (ref 5–15)
BUN: 20 mg/dL (ref 8–23)
CO2: 22 mmol/L (ref 22–32)
Calcium: 8.9 mg/dL (ref 8.9–10.3)
Chloride: 108 mmol/L (ref 98–111)
Creatinine, Ser: 1.33 mg/dL — ABNORMAL HIGH (ref 0.44–1.00)
GFR, Estimated: 45 mL/min — ABNORMAL LOW (ref >60)
Glucose, Bld: 132 mg/dL — ABNORMAL HIGH (ref 70–99)
Potassium: 3.6 mmol/L (ref 3.5–5.1)
Sodium: 138 mmol/L (ref 135–145)
Total Bilirubin: 1.8 mg/dL — ABNORMAL HIGH (ref 0.3–1.2)
Total Protein: 7 g/dL (ref 6.5–8.1)

## 2021-11-27 LAB — PROCALCITONIN: Procalcitonin: 19.76 ng/mL

## 2021-11-27 LAB — PROTIME-INR
INR: 1.2 (ref 0.8–1.2)
Prothrombin Time: 14.9 seconds (ref 11.4–15.2)

## 2021-11-27 LAB — LACTIC ACID, PLASMA
Lactic Acid, Venous: 2.4 mmol/L (ref 0.5–1.9)
Lactic Acid, Venous: 3.1 mmol/L (ref 0.5–1.9)
Lactic Acid, Venous: 4.7 mmol/L (ref 0.5–1.9)
Lactic Acid, Venous: 3 mmol/L (ref 0.5–1.9)

## 2021-11-27 LAB — APTT: aPTT: 26 seconds (ref 24–36)

## 2021-11-27 MED ORDER — KETOROLAC TROMETHAMINE 30 MG/ML IJ SOLN
15.0000 mg | Freq: Once | INTRAMUSCULAR | Status: AC
  Administered 2021-11-27: 15 mg via INTRAVENOUS
  Filled 2021-11-27: qty 1

## 2021-11-27 MED ORDER — ACETAMINOPHEN 325 MG PO TABS
650.0000 mg | ORAL_TABLET | Freq: Once | ORAL | Status: AC
Start: 2021-11-27 — End: 2021-11-27
  Administered 2021-11-27: 650 mg via ORAL
  Filled 2021-11-27: qty 2

## 2021-11-27 MED ORDER — CLONAZEPAM 0.5 MG PO TABS
0.5000 mg | ORAL_TABLET | Freq: Two times a day (BID) | ORAL | Status: DC | PRN
  Administered 2021-11-30: 0.5 mg via ORAL
  Filled 2021-11-27: qty 1

## 2021-11-27 MED ORDER — LACTATED RINGERS IV BOLUS
500.0000 mL | Freq: Once | INTRAVENOUS | Status: AC
Start: 2021-11-27 — End: 2021-11-27
  Administered 2021-11-27: 500 mL via INTRAVENOUS

## 2021-11-27 MED ORDER — ONDANSETRON HCL 4 MG/2ML IJ SOLN
4.0000 mg | Freq: Once | INTRAMUSCULAR | Status: AC
  Administered 2021-11-27: 4 mg via INTRAVENOUS
  Filled 2021-11-27: qty 2

## 2021-11-27 MED ORDER — POLYETHYLENE GLYCOL 3350 17 G PO PACK: 17.0000 g | PACK | Freq: Every day | ORAL | Status: DC | PRN

## 2021-11-27 MED ORDER — ONDANSETRON HCL 4 MG PO TABS: 4.0000 mg | ORAL_TABLET | Freq: Four times a day (QID) | ORAL | Status: DC | PRN

## 2021-11-27 MED ORDER — BUPROPION HCL ER (XL) 150 MG PO TB24
300.0000 mg | ORAL_TABLET | Freq: Every day | ORAL | Status: DC
  Administered 2021-11-27 – 2021-12-01 (×5): 300 mg via ORAL
  Filled 2021-11-27 (×5): qty 2

## 2021-11-27 MED ORDER — TRAMADOL HCL 50 MG PO TABS
50.0000 mg | ORAL_TABLET | Freq: Four times a day (QID) | ORAL | Status: DC | PRN
  Administered 2021-11-27 – 2021-11-30 (×7): 50 mg via ORAL
  Filled 2021-11-27 (×10): qty 1

## 2021-11-27 MED ORDER — CITALOPRAM HYDROBROMIDE 10 MG PO TABS
60.0000 mg | ORAL_TABLET | Freq: Every day | ORAL | Status: DC
  Administered 2021-11-27 – 2021-12-01 (×5): 60 mg via ORAL
  Filled 2021-11-27 (×5): qty 6

## 2021-11-27 MED ORDER — LACTATED RINGERS IV SOLN: INTRAVENOUS | Status: AC

## 2021-11-27 MED ORDER — ENOXAPARIN SODIUM 40 MG/0.4ML IJ SOSY
40.0000 mg | PREFILLED_SYRINGE | INTRAMUSCULAR | Status: DC
  Administered 2021-11-27 – 2021-11-28 (×2): 40 mg via SUBCUTANEOUS
  Filled 2021-11-27 (×2): qty 0.4

## 2021-11-27 MED ORDER — SODIUM CHLORIDE 0.9 % IV SOLN
2.0000 g | Freq: Once | INTRAVENOUS | Status: AC
  Administered 2021-11-27: 2 g via INTRAVENOUS
  Filled 2021-11-27: qty 20

## 2021-11-27 MED ORDER — LACTATED RINGERS IV BOLUS
2000.0000 mL | Freq: Once | INTRAVENOUS | Status: AC
  Administered 2021-11-27: 2000 mL via INTRAVENOUS

## 2021-11-27 MED ORDER — LORAZEPAM 2 MG/ML IJ SOLN
0.5000 mg | Freq: Once | INTRAMUSCULAR | Status: AC
  Administered 2021-11-27: 0.5 mg via INTRAVENOUS
  Filled 2021-11-27: qty 1

## 2021-11-27 MED ORDER — ONDANSETRON HCL 4 MG/2ML IJ SOLN
4.0000 mg | Freq: Four times a day (QID) | INTRAMUSCULAR | Status: DC | PRN
  Administered 2021-11-27 – 2021-11-29 (×4): 4 mg via INTRAVENOUS
  Filled 2021-11-27 (×4): qty 2

## 2021-11-27 MED ORDER — ACETAMINOPHEN 650 MG RE SUPP: 650.0000 mg | Freq: Four times a day (QID) | RECTAL | Status: DC | PRN

## 2021-11-27 MED ORDER — SODIUM CHLORIDE 0.9 % IV SOLN
2.0000 g | INTRAVENOUS | Status: DC
  Administered 2021-11-28 – 2021-11-30 (×3): 2 g via INTRAVENOUS
  Filled 2021-11-27: qty 2
  Filled 2021-11-27 (×3): qty 20

## 2021-11-27 MED ORDER — ACETAMINOPHEN 325 MG PO TABS
650.0000 mg | ORAL_TABLET | Freq: Four times a day (QID) | ORAL | Status: DC | PRN
  Administered 2021-11-27 – 2021-11-28 (×4): 650 mg via ORAL
  Filled 2021-11-27 (×4): qty 2

## 2021-11-27 MED ORDER — LACTATED RINGERS IV BOLUS
1000.0000 mL | Freq: Once | INTRAVENOUS | Status: AC
  Administered 2021-11-27: 1000 mL via INTRAVENOUS

## 2021-11-27 MED ORDER — VITAMIN D 25 MCG (1000 UNIT) PO TABS
1000.0000 [IU] | ORAL_TABLET | Freq: Every day | ORAL | Status: DC
  Administered 2021-11-27 – 2021-12-01 (×5): 1000 [IU] via ORAL
  Filled 2021-11-27 (×5): qty 1

## 2021-11-27 NOTE — Assessment & Plan Note (Signed)
Met sepsis criteria with fever, tachycardia and tachypnea.  No leukocytosis.  Blood pressure soft, responded to IV fluid. Severe sepsis with lactic acidosis and AKI. Most likely secondary to UTI as patient is having dysuria, UA with hematuria and mild leukocytosis. Procalcitonin elevated at 19.76>>82.38. Lactic acid 2.4>>3.1>>4.7>>3.0>>1.2 1/4 blood cultures with E. Coli. -Continue with ceftriaxone. -Follow-up urine and blood cultures

## 2021-11-27 NOTE — ED Notes (Signed)
Report received from Judson Roch, [place]. Pt is connected to cardiac monitoring. Pt is resting.

## 2021-11-27 NOTE — ED Notes (Signed)
Dr. Cherylann Banas at bedside at this time for reevaluation.

## 2021-11-27 NOTE — Assessment & Plan Note (Signed)
Most likely secondary to sepsis. -Continue with IV hydration -Monitor renal function

## 2021-11-27 NOTE — ED Notes (Signed)
Reminded patient that we need a urine sample at this time. Verbalized understanding, call bell within reach.

## 2021-11-27 NOTE — Assessment & Plan Note (Signed)
-   Holding home propranolol for softer blood pressure-can resume if blood pressure improves

## 2021-11-27 NOTE — ED Notes (Signed)
Pt ambulatory to the restroom at this time. No assistance needed.

## 2021-11-27 NOTE — Assessment & Plan Note (Addendum)
Blood pressure within goal today. -Restart home propranolol-as having mild tachycardia. -Continue holding spironolactone as getting some fluid for AKI

## 2021-11-27 NOTE — ED Notes (Signed)
Pt given ice chips at this time 

## 2021-11-27 NOTE — Progress Notes (Signed)
PHARMACY - PHYSICIAN COMMUNICATION CRITICAL VALUE ALERT - BLOOD CULTURE IDENTIFICATION (BCID)  Tracie Sheppard is an 66 y.o. female who presented to [facility] on 11/27/2021 with a chief complaint of lower abdominal pain.  Assessment:  BCID E. Coli (no resistance) 1/4 bottles. Gram stain GNR. Also admitted with UTI (include suspected source if known)  Name of physician (or Provider) Contacted: Dr. Sidney Ace  Current antibiotics: ceftriaxone 2 grams every 24 hours  Changes to prescribed antibiotics recommended:  Recommendations accepted by provider  Results for orders placed or performed during the hospital encounter of 11/27/21  Blood Culture ID Panel (Reflexed) (Collected: 11/27/2021  6:22 AM)  Result Value Ref Range   Enterococcus faecalis NOT DETECTED NOT DETECTED   Enterococcus Faecium NOT DETECTED NOT DETECTED   Listeria monocytogenes NOT DETECTED NOT DETECTED   Staphylococcus species NOT DETECTED NOT DETECTED   Staphylococcus aureus (BCID) NOT DETECTED NOT DETECTED   Staphylococcus epidermidis NOT DETECTED NOT DETECTED   Staphylococcus lugdunensis NOT DETECTED NOT DETECTED   Streptococcus species NOT DETECTED NOT DETECTED   Streptococcus agalactiae NOT DETECTED NOT DETECTED   Streptococcus pneumoniae NOT DETECTED NOT DETECTED   Streptococcus pyogenes NOT DETECTED NOT DETECTED   A.calcoaceticus-baumannii NOT DETECTED NOT DETECTED   Bacteroides fragilis NOT DETECTED NOT DETECTED   Enterobacterales PENDING NOT DETECTED   Enterobacter cloacae complex NOT DETECTED NOT DETECTED   Escherichia coli DETECTED (A) NOT DETECTED   Klebsiella aerogenes NOT DETECTED NOT DETECTED   Klebsiella oxytoca NOT DETECTED NOT DETECTED   Klebsiella pneumoniae NOT DETECTED NOT DETECTED   Proteus species NOT DETECTED NOT DETECTED   Salmonella species NOT DETECTED NOT DETECTED   Serratia marcescens NOT DETECTED NOT DETECTED   Haemophilus influenzae NOT DETECTED NOT DETECTED   Neisseria meningitidis NOT  DETECTED NOT DETECTED   Pseudomonas aeruginosa NOT DETECTED NOT DETECTED   Stenotrophomonas maltophilia NOT DETECTED NOT DETECTED   Candida albicans NOT DETECTED NOT DETECTED   Candida auris NOT DETECTED NOT DETECTED   Candida glabrata NOT DETECTED NOT DETECTED   Candida krusei NOT DETECTED NOT DETECTED   Candida parapsilosis NOT DETECTED NOT DETECTED   Candida tropicalis NOT DETECTED NOT DETECTED   Cryptococcus neoformans/gattii NOT DETECTED NOT DETECTED   CTX-M ESBL NOT DETECTED NOT DETECTED   Carbapenem resistance IMP NOT DETECTED NOT DETECTED   Carbapenem resistance KPC NOT DETECTED NOT DETECTED   Carbapenem resistance NDM NOT DETECTED NOT DETECTED   Carbapenem resist OXA 48 LIKE NOT DETECTED NOT DETECTED   Carbapenem resistance VIM NOT DETECTED NOT DETECTED    Glean Salvo, PharmD Clinical Pharmacist  11/27/2021 10:39 PM

## 2021-11-27 NOTE — H&P (Signed)
History and Physical    Patient: Tracie Sheppard VFI:[id number] DOB: 02/09/1956 DOA: 11/27/2021 DOS: the patient was seen and examined on 11/27/2021 PCP: Valerie Roys, DO  Patient coming from: Home  Chief Complaint:  Chief Complaint  Patient presents with   Dysuria   HPI: Tracie Sheppard is a 66 y.o. female with medical history significant of hypertension, essential tremors which is being managed by neurology, anxiety and depression and nephrolithiasis came to ED with complaint of lower abdominal pain, lower back pain, bilateral flank pain, dysuria along with fever and chills for the past 2 days. Patient was also experiencing some nausea and vomiting since yesterday.  No diarrhea. Patient denies any upper respiratory symptoms, chest pain or shortness of breath. No change in her appetite or bowel habits.  ED course.  On arrival patient was febrile at 101.8, tachycardic and tachypneic.  Labs pertinent for creatinine of 1.33 with baseline of 1, T. bili of 1.8, procalcitonin at 19.76, lactic acid at 2.4>>3.1.  UA with microscopic hematuria, leukocytosis and rare bacteria 40. Chest x-ray negative for any acute abnormality.  CT renal stone study with multiple nonobstructing calculi within the collecting system of both kidneys measuring up to 5 mm in the lower pole of the right kidney.  No ureteric stones or urinary tract obstruction. Urine and blood cultures ordered. Patient was started on ceftriaxone.  TRH was consulted for admission  Review of Systems: As mentioned in the history of present illness. All other systems reviewed and are negative. Past Medical History:  Diagnosis Date   Allergy    Seasonal   Anxiety    Bilateral carpal tunnel syndrome 05/30/2017   Chronic back pain    Depression, major, recurrent, moderate (HCC)    Diabetes mellitus without complication (HCC)    Prediabetic   High serum testosterone    HTN (hypertension) 09/12/2019   Kidney stone    Sleep apnea     Thyroid disease    did not show up in lab work but per other symptoms previous doctor started her on this   Past Surgical History:  Procedure Laterality Date   CHOLECYSTECTOMY     1997   COLONOSCOPY WITH PROPOFOL N/A 11/16/2018   Procedure: COLONOSCOPY WITH PROPOFOL;  Surgeon: Lin Landsman, MD;  Location: [facility];  Service: Gastroenterology;  Laterality: N/A;   CYSTOSCOPY  09/03/2021   ESOPHAGOGASTRODUODENOSCOPY (EGD) WITH PROPOFOL N/A 11/16/2018   Procedure: ESOPHAGOGASTRODUODENOSCOPY (EGD) WITH PROPOFOL;  Surgeon: Lin Landsman, MD;  Location: [facility];  Service: Gastroenterology;  Laterality: N/A;   NASAL SEPTUM SURGERY     OOPHORECTOMY     TUBAL LIGATION     1989   Social History:  reports that she has never smoked. She has never used smokeless tobacco. She reports current alcohol use. She reports that she does not use drugs.  Allergies  Allergen Reactions   Aleve [Naproxen Sodium] Other (See Comments)    Chest pain   Statins Other (See Comments)    myalgias    Family History  Problem Relation Age of Onset   Diabetes Mother    Anemia Mother    Hyperlipidemia Mother    Arthritis Mother    Hypertension Father    Cancer Father    Celiac disease Sister    Diabetes Maternal Grandmother    Stroke Maternal Grandmother    Heart attack Maternal Grandmother    Heart disease Paternal Grandfather     Prior to Admission medications   Medication  Sig Start Date End Date Taking? Authorizing Provider  acetaminophen (TYLENOL) 500 MG tablet Tylenol Extra Strength 500 mg tablet    [provider]  albuterol (VENTOLIN HFA) 108 (90 Base) MCG/ACT inhaler Inhale 2 puffs into the lungs every 6 (six) hours as needed for wheezing or shortness of breath. 09/28/21   Johnson, Megan P, DO  Alpha-Lipoic Acid 600 MG CAPS Take 600 mg by mouth daily.    [provider]  buPROPion (WELLBUTRIN XL) 300 MG 24 hr tablet Take 1 tablet (300 mg total) by mouth  daily. 09/28/21   Johnson, Megan P, DO  cetirizine (ZYRTEC) 10 MG tablet Take 1 tablet (10 mg total) by mouth daily as needed for allergies. 05/20/21   Johnson, Megan P, DO  citalopram (CELEXA) 40 MG tablet Take 1.5 tablets (60 mg total) by mouth daily. 09/28/21   Johnson, Megan P, DO  clonazePAM (KLONOPIN) 0.5 MG tablet TAKE 1 TABLET BY MOUTH DAILY AS NEEDED FOR ANIXETY 09/28/21   Johnson, Megan P, DO  Cyanocobalamin 1000 MCG TBCR Take 1,000 mcg by mouth daily.    [provider]  cyclobenzaprine (FLEXERIL) 10 MG tablet Take 0.5-1 tablets (5-10 mg total) by mouth at bedtime. 10/20/21   Johnson, Megan P, DO  diclofenac (VOLTAREN) 75 MG EC tablet Take 1 tablet (75 mg total) by mouth 2 (two) times daily. 10/20/21   Johnson, Megan P, DO  fluticasone (FLONASE) 50 MCG/ACT nasal spray SPRAY 1 SPRAY INTO EACH NOSTRIL EVERY DAY 05/20/21   Johnson, Megan P, DO  ibuprofen (ADVIL) 200 MG tablet Take 200 mg by mouth as needed.     [provider]  propranolol ER (INDERAL LA) 60 MG 24 hr capsule Take 1 tablet by mouth daily. 05/03/21   [provider]  spironolactone (ALDACTONE) 100 MG tablet Take 1 tablet (100 mg total) by mouth 2 (two) times daily. 09/28/21   Johnson, Megan P, DO  VITAMIN D PO Take by mouth daily. 1000 mcg    [provider]    Physical Exam: Vitals:   11/27/21 0942 11/27/21 1024 11/27/21 1250 11/27/21 1415  BP: (!) 93/58 (!) 91/52 90/64 95/62   Pulse: 95 90 84 84  Resp: 20 16 16 18   Temp:  98.3 F (36.8 C)    TempSrc:      SpO2: 98% 98% 99% 98%  Weight:      Height:        General: Vital signs reviewed.  Patient is well-developed and well-nourished, in no acute distress and cooperative with exam.  Head: Normocephalic and atraumatic. Eyes: EOMI, conjunctivae normal, no scleral icterus.  Neck: Supple, trachea midline, normal ROM, no JVD,  Cardiovascular: RRR, S1 normal, S2 normal, no murmurs, gallops, or rubs. Pulmonary/Chest: Clear to auscultation  bilaterally, no wheezes, rales, or rhonchi. Abdominal: Soft, non-tender, non-distended, BS +,  Extremities: No lower extremity edema bilaterally,  pulses symmetric and intact bilaterally.  Neurological: A&O x3, Strength is normal and symmetric bilaterally, cranial nerve II-XII are grossly intact, no focal motor deficit, sensory intact to light touch bilaterally.  Skin: Warm, dry and intact. No rashes or erythema. Psychiatric: Normal mood and affect.   Data Reviewed: Prior data reviewed as mentioned above.  Assessment and Plan: * Severe sepsis ([place]) Met sepsis criteria with fever, tachycardia and tachypnea.  No leukocytosis.  Blood pressure soft, responded to IV fluid. Severe sepsis with lactic acidosis and AKI. Most likely secondary to UTI as patient is having dysuria, UA with hematuria and mild leukocytosis.  Procalcitonin elevated at 19.76. Lactic acid 2.4>>3.1 Urine and blood cultures ordered-pending. -Admit to MedSurg -Continue with ceftriaxone. -Follow-up urine and blood cultures  AKI (acute kidney injury) ([place]) Most likely secondary to sepsis. -Continue with IV hydration -Monitor renal function  HTN (hypertension) Blood pressure on softer side. -Holding home spironolactone and propranolol  Depression, major, recurrent, moderate (HCC) - Continue home Celexa and Wellbutrin  Tremor - Holding home propranolol for softer blood pressure-can resume if blood pressure improves  Nephrolithiasis Patient with known history of nephrolithiasis for which she followed up with urology as an outpatient. No concern of obstruction. -Continue outpatient urology follow-up  Advance Care Planning:   Code Status: Full Code   Consults: None  Family Communication: Daughter at bedside  Severity of Illness: The appropriate patient status for this patient is INPATIENT. Inpatient status is judged to be reasonable and necessary in order to provide the required intensity of service to ensure the  patient's safety. The patient's presenting symptoms, physical exam findings, and initial radiographic and laboratory data in the context of their chronic comorbidities is felt to place them at high risk for further clinical deterioration. Furthermore, it is not anticipated that the patient will be medically stable for discharge from the hospital within 2 midnights of admission.   * I certify that at the point of admission it is my clinical judgment that the patient will require inpatient hospital care spanning beyond 2 midnights from the point of admission due to high intensity of service, high risk for further deterioration and high frequency of surveillance required.*  This record has been created using Systems analyst. Errors have been sought and corrected,but may not always be located. Such creation errors do not reflect on the standard of care.   Author: Lorella Nimrod, MD 11/27/2021 4:56 PM  For on call review [email or web address].

## 2021-11-27 NOTE — ED Triage Notes (Signed)
Pt has been having dark, foul smelling urine for a week. Pt is now complaining of back, abd, and leg pain. Pt has vomited prior to arrival.

## 2021-11-27 NOTE — Assessment & Plan Note (Signed)
Patient with known history of nephrolithiasis for which she followed up with urology as an outpatient. No concern of obstruction. -Continue outpatient urology follow-up

## 2021-11-27 NOTE — Hospital Course (Signed)
Tracie Sheppard is a 66 y.o. female with medical history significant of hypertension, essential tremors which is being managed by neurology, anxiety and depression and nephrolithiasis came to ED with complaint of lower abdominal pain, lower back pain, bilateral flank pain, dysuria along with fever and chills for the past 2 days. Patient was also experiencing some nausea and vomiting since yesterday.  No diarrhea. Patient denies any upper respiratory symptoms, chest pain or shortness of breath. No change in her appetite or bowel habits.  ED course.  On arrival patient was febrile at 101.8, tachycardic and tachypneic.  Labs pertinent for creatinine of 1.33 with baseline of 1, T. bili of 1.8, procalcitonin at 19.76, lactic acid at 2.4>>3.1.  UA with microscopic hematuria, leukocytosis and rare bacteria 40. Chest x-ray negative for any acute abnormality.  CT renal stone study with multiple nonobstructing calculi within the collecting system of both kidneys measuring up to 5 mm in the lower pole of the right kidney.  No ureteric stones or urinary tract obstruction. Urine and blood cultures ordered. Patient was started on ceftriaxone.  TRH was consulted for admission.  8/27; patient appears to have little late immune response, worsening leukocytosis and procalcitonin today.  Blood cultures with E. Coli, suspecting urinary source.  Urine cultures pending. We will continue ceftriaxone.  Giving some more IV fluids with slight worsening of creatinine. Patient was having loose watery stool since last night.  As there was worsening of leukocytosis will check for C. difficile and GI pathogen panel and they were negative. Most likely secondary to antibiotic use-probiotic added.  8/28: Patient continued to have some diarrhea although improving.  Continued to have abdominal pain.  CT abdomen and pelvis was done which shows some hemorrhagic conversion of her prior renal cyst and some perinephric stranding.  Multiple  small bilateral nonobstructing renal calculi. Urine cultures negative.  Blood cultures with E. coli still pending susceptibility. Patient need to follow-up with her urologist after this discharge for further management of the CT findings.  8/29: Patient developed some shortness of breath yesterday evening.  Chest x-ray with some interval increase in diffuse bilateral opacities, infectious versus pulmonary edema.  Patient did received IV fluid.  IV fluids were discontinued.  Remained on room air with transient desaturation with ambulation.  AKI has been resolved.  Labs overall improving.  Giving some IV Lasix. Final blood cultures with pansensitive E. coli, antibiotics switched to cefadroxil 1000 mg twice daily for total of 2 weeks. If continue to improve can be discharged tomorrow.

## 2021-11-27 NOTE — Assessment & Plan Note (Signed)
-   Continue home Celexa and Wellbutrin

## 2021-11-27 NOTE — ED Provider Notes (Signed)
[facility] Provider Note    Event Date/Time   First MD Initiated Contact with Patient 11/27/21 [phone]     (approximate)   History   Dysuria   HPI  Tracie Sheppard is a 66 y.o. female who presents to the ED for evaluation of Dysuria   I reviewed urology clinic visit from 5/26.  Obese patient with history of nephrolithiasis, HTN, DM  Patient presents to the ED for evaluation of 1 week of worsening dysuria.  She reports dysuria, dark-colored urine and foul-smelling urine for the past 1 week.  Reports developing bilateral flank pain, fevers, nausea over the past few days.  No recent antibiotics.  Does not think that she is having a kidney stone.   Physical Exam   Triage Vital Signs: ED Triage Vitals  Enc Vitals Group     BP      Pulse      Resp      Temp      Temp src      SpO2      Weight      Height      Head Circumference      Peak Flow      Pain Score      Pain Loc      Pain Edu?      Excl. in [place]?     Most recent vital signs: Vitals:   11/27/21 0655 11/27/21 0700  BP:  (!) 92/51  Pulse: (!) 116 (!) 111  Resp: 20 (!) 27  Temp:  (!) 101.8 F (38.8 C)  SpO2: 96% 94%    General: Awake, no distress.  Seems uncomfortable, but looks well and is conversational.  Interacting appropriately.  Touch CV:  Good peripheral perfusion.  Tachycardic and regular Resp:  Normal effort.  Abd:  No distention.  Suprapubic tenderness without guarding or peritoneal features.  Upper abdomen is benign. MSK:  No deformity noted.  Neuro:  No focal deficits appreciated. Other:     ED Results / Procedures / Treatments   Labs (all labs ordered are listed, but only abnormal results are displayed) Labs Reviewed  LACTIC ACID, PLASMA - Abnormal; Notable for the following components:      Result Value   Lactic Acid, Venous 2.4 (*)    All other components within normal limits  COMPREHENSIVE METABOLIC PANEL - Abnormal; Notable for the following components:    Glucose, Bld 132 (*)    Creatinine, Ser 1.33 (*)    Total Bilirubin 1.8 (*)    GFR, Estimated 45 (*)    All other components within normal limits  CBC WITH DIFFERENTIAL/PLATELET - Abnormal; Notable for the following components:   Lymphs Abs 0.4 (*)    Monocytes Absolute 0.0 (*)    All other components within normal limits  CULTURE, BLOOD (ROUTINE X 2)  CULTURE, BLOOD (ROUTINE X 2)  URINE CULTURE  PROTIME-INR  APTT  LACTIC ACID, PLASMA  URINALYSIS, COMPLETE (UACMP) WITH MICROSCOPIC  PROCALCITONIN    EKG Poor quality EKG seems to demonstrate a sinus tachycardia with rate of 119 bpm.  Seemingly normal axis and intervals without clear signs of acute ischemia.  RADIOLOGY CXR interpreted by me without evidence of acute cardiopulmonary pathology. CT renal study interpreted by me without evidence of urologic obstruction.  Official radiology report(s): DG Chest Port 1 View  Result Date: 11/27/2021 CLINICAL DATA:  Questionable sepsis EXAM: PORTABLE CHEST 1 VIEW COMPARISON:  01/21/2021 FINDINGS: Low volume chest which accentuates  heart size that is otherwise stable appearing. Stable aortic and hilar contours. There is no edema, consolidation, effusion, or pneumothorax. No acute osseous finding. IMPRESSION: Low volume chest without focal abnormality. Electronically Signed   By: Jorje Guild M.D.   On: 11/27/2021 07:03   CT Renal Stone Study  Result Date: 11/27/2021 CLINICAL DATA:  66 year old female with history of urinary tract infection and sepsis. Suspected pyelonephritis. Dark foul-smelling urine for 1 week. EXAM: CT ABDOMEN AND PELVIS WITHOUT CONTRAST TECHNIQUE: Multidetector CT imaging of the abdomen and pelvis was performed following the standard protocol without IV contrast. RADIATION DOSE REDUCTION: This exam was performed according to the departmental dose-optimization program which includes automated exposure control, adjustment of the mA and/or kV according to patient size and/or  use of iterative reconstruction technique. COMPARISON:  CT of the abdomen and pelvis 07/21/2021. FINDINGS: Lower chest: Unremarkable. Hepatobiliary: No definite suspicious cystic or solid hepatic lesions are confidently identified on today's noncontrast CT examination. Status post cholecystectomy. Pancreas: No definite pancreatic mass or peripancreatic fluid collections or inflammatory changes are noted on today's noncontrast CT examination. Spleen: Unremarkable. Adrenals/Urinary Tract: Multiple nonobstructive calculi are noted within the collecting systems of both kidneys measuring up to 5 mm in the lower pole collecting system of the right kidney. No additional calculi are identified along the course of either ureter, or within the lumen of the urinary bladder. No hydroureteronephrosis. In the interpolar region of the right kidney there is a 2.7 cm low-attenuation lesion, incompletely characterized on today's noncontrast CT examination, but similar to the prior study and statistically likely a cyst (no imaging follow-up is recommended). Left kidney is normal in appearance. Urinary bladder is unremarkable in appearance. Bilateral adrenal glands are normal in appearance. Stomach/Bowel: Unenhanced appearance of the stomach is normal. There is no pathologic dilatation of small bowel or colon. Normal appendix. Vascular/Lymphatic: Aortic atherosclerosis (mild). No lymphadenopathy noted in the abdomen or pelvis. Reproductive: Uterus and ovaries are atrophic. Other: No significant volume of ascites.  No pneumoperitoneum. Musculoskeletal: There are no aggressive appearing lytic or blastic lesions noted in the visualized portions of the skeleton. IMPRESSION: 1. Multiple nonobstructive calculi are noted within the collecting systems of both kidneys measuring up to 5 mm in the lower pole collecting system of the right kidney. No ureteral stones or findings of urinary tract obstruction are noted at this time. 2. Mild aortic  atherosclerosis. 3. Additional incidental findings, as above. Electronically Signed   By: Vinnie Langton M.D.   On: 11/27/2021 06:49    PROCEDURES and INTERVENTIONS:  .1-3 Lead EKG Interpretation  Performed by: Vladimir Crofts, MD Authorized by: Vladimir Crofts, MD     Interpretation: abnormal     ECG rate:  116   ECG rate assessment: tachycardic     Rhythm: sinus tachycardia     Ectopy: none     Conduction: normal   .Critical Care  Performed by: Vladimir Crofts, MD Authorized by: Vladimir Crofts, MD   Critical care provider statement:    Critical care time (minutes):  30   Critical care time was exclusive of:  Separately billable procedures and treating other patients   Critical care was necessary to treat or prevent imminent or life-threatening deterioration of the following conditions:  Sepsis   Critical care was time spent personally by me on the following activities:  Development of treatment plan with patient or surrogate, discussions with consultants, evaluation of patient's response to treatment, examination of patient, ordering and review of laboratory studies, ordering and  review of radiographic studies, ordering and performing treatments and interventions, pulse oximetry, re-evaluation of patient's condition and review of old charts   Medications  ketorolac (TORADOL) 30 MG/ML injection 15 mg (has no administration in time range)  lactated ringers bolus 2,000 mL (2,000 mLs Intravenous New Bag/Given 11/27/21 0648)  cefTRIAXone (ROCEPHIN) 2 g in sodium chloride 0.9 % 100 mL IVPB (2 g Intravenous New Bag/Given 11/27/21 0648)  ondansetron (ZOFRAN) injection 4 mg (4 mg Intravenous Given 11/27/21 0648)     IMPRESSION / MDM / ASSESSMENT AND PLAN / ED COURSE  I reviewed the triage vital signs and the nursing notes.  Differential diagnosis includes, but is not limited to, sepsis, urologic obstruction, pyelonephritis, appendicitis, acute renal failure  {Patient presents with symptoms of an  acute illness or injury that is potentially life-threatening.  66 year old diabetic woman presents to the ED with stigmata of sepsis, likely urologic in etiology.  She is febrile and tachycardic, but remains hemodynamically stable.  Blood work is quite reassuring without leukocytosis or significant metabolic derangements.  Lactic acidosis is noted.  Procalcitonin is pending.  CXR is clear and CT renal study without evidence of urologic obstruction.  She is started on fluid resuscitation, Keflex as we await urine sample.  Anticipate admission.  We will consult medicine.      FINAL CLINICAL IMPRESSION(S) / ED DIAGNOSES   Final diagnoses:  Sepsis without acute organ dysfunction, due to unspecified organism [facility])  Dysuria     Rx / DC Orders   ED Discharge Orders     None        Note:  This document was prepared using Dragon voice recognition software and may include unintentional dictation errors.   Vladimir Crofts, MD 11/27/21 [phone]

## 2021-11-27 NOTE — ED Provider Notes (Signed)
-----------------------------------------   9:24 AM on 11/27/2021 -----------------------------------------  I took over care of this patient from Dr. Tamala Julian.  Work-up is consistent with UTI.  The patient has received 2 L of fluids and her blood pressure is improved.  She looks well on reassessment and states that she feels relatively well although her lactate actually climbed.  CT shows no acute complications.  She has received antibiotics.  I consulted Dr. Reesa Chew from the hospitalist service: Based on our discussion she agrees to admit the patient.   Arta Silence, MD 11/27/21 [phone]

## 2021-11-28 DIAGNOSIS — R6520 Severe sepsis without septic shock: Secondary | ICD-10-CM | POA: Diagnosis not present

## 2021-11-28 DIAGNOSIS — R197 Diarrhea, unspecified: Secondary | ICD-10-CM

## 2021-11-28 DIAGNOSIS — A419 Sepsis, unspecified organism: Secondary | ICD-10-CM | POA: Diagnosis not present

## 2021-11-28 LAB — URINE CULTURE: Culture: NO GROWTH

## 2021-11-28 LAB — GASTROINTESTINAL PANEL BY PCR, STOOL (REPLACES STOOL CULTURE)

## 2021-11-28 LAB — CBC
HCT: 33.6 % — ABNORMAL LOW (ref 36.0–46.0)
Hemoglobin: 10.7 g/dL — ABNORMAL LOW (ref 12.0–15.0)
MCH: 28.5 pg (ref 26.0–34.0)
MCHC: 31.8 g/dL (ref 30.0–36.0)
MCV: 89.6 fL (ref 80.0–100.0)
Platelets: 127 10*3/uL — ABNORMAL LOW (ref 150–400)
RBC: 3.75 MIL/uL — ABNORMAL LOW (ref 3.87–5.11)
RDW: 13 % (ref 11.5–15.5)
WBC: 15.8 10*3/uL — ABNORMAL HIGH (ref 4.0–10.5)
nRBC: 0 % (ref 0.0–0.2)

## 2021-11-28 LAB — BASIC METABOLIC PANEL
Anion gap: 4 — ABNORMAL LOW (ref 5–15)
BUN: 25 mg/dL — ABNORMAL HIGH (ref 8–23)
CO2: 27 mmol/L (ref 22–32)
Calcium: 8.2 mg/dL — ABNORMAL LOW (ref 8.9–10.3)
Chloride: 108 mmol/L (ref 98–111)
Creatinine, Ser: 1.37 mg/dL — ABNORMAL HIGH (ref 0.44–1.00)
GFR, Estimated: 43 mL/min — ABNORMAL LOW (ref >60)
Glucose, Bld: 81 mg/dL (ref 70–99)
Potassium: 4.1 mmol/L (ref 3.5–5.1)
Sodium: 139 mmol/L (ref 135–145)

## 2021-11-28 LAB — LACTIC ACID, PLASMA: Lactic Acid, Venous: 1.2 mmol/L (ref 0.5–1.9)

## 2021-11-28 LAB — C DIFFICILE QUICK SCREEN W PCR REFLEX
C Diff antigen: NEGATIVE
C Diff interpretation: NOT DETECTED
C Diff toxin: NEGATIVE

## 2021-11-28 LAB — HIV ANTIBODY (ROUTINE TESTING W REFLEX): HIV Screen 4th Generation wRfx: NONREACTIVE

## 2021-11-28 LAB — PROCALCITONIN: Procalcitonin: 82.38 ng/mL

## 2021-11-28 LAB — GLUCOSE, CAPILLARY: Glucose-Capillary: 81 mg/dL (ref 70–99)

## 2021-11-28 MED ORDER — PROPRANOLOL HCL ER 60 MG PO CP24
60.0000 mg | ORAL_CAPSULE | Freq: Every day | ORAL | Status: DC
  Administered 2021-11-28 – 2021-12-01 (×4): 60 mg via ORAL
  Filled 2021-11-28 (×4): qty 1

## 2021-11-28 MED ORDER — RISAQUAD PO CAPS
1.0000 | ORAL_CAPSULE | Freq: Every day | ORAL | Status: DC
  Administered 2021-11-28 – 2021-12-01 (×4): 1 via ORAL
  Filled 2021-11-28 (×4): qty 1

## 2021-11-28 MED ORDER — LIDOCAINE 5 % EX PTCH
1.0000 | MEDICATED_PATCH | CUTANEOUS | Status: DC
  Administered 2021-11-28 – 2021-11-30 (×3): 1 via TRANSDERMAL
  Filled 2021-11-28 (×3): qty 1

## 2021-11-28 MED ORDER — LACTATED RINGERS IV SOLN: INTRAVENOUS | Status: AC

## 2021-11-28 MED ORDER — KETOROLAC TROMETHAMINE 15 MG/ML IJ SOLN
15.0000 mg | Freq: Once | INTRAMUSCULAR | Status: AC
  Administered 2021-11-28: 15 mg via INTRAVENOUS
  Filled 2021-11-28: qty 1

## 2021-11-28 MED ORDER — CYCLOBENZAPRINE HCL 5 MG PO TABS
7.5000 mg | ORAL_TABLET | Freq: Once | ORAL | Status: AC
  Administered 2021-11-28: 7.5 mg via ORAL
  Filled 2021-11-28: qty 1.5

## 2021-11-28 NOTE — Progress Notes (Signed)
Progress Note   Patient: Tracie Sheppard MBE:[id number] DOB: [birth year] DOA: 11/27/2021     1 DOS: the patient was seen and examined on 11/28/2021   Brief hospital course: Tracie Sheppard is a 66 y.o. female with medical history significant of hypertension, essential tremors which is being managed by neurology, anxiety and depression and nephrolithiasis came to ED with complaint of lower abdominal pain, lower back pain, bilateral flank pain, dysuria along with fever and chills for the past 2 days. Patient was also experiencing some nausea and vomiting since yesterday.  No diarrhea. Patient denies any upper respiratory symptoms, chest pain or shortness of breath. No change in her appetite or bowel habits.  ED course.  On arrival patient was febrile at 101.8, tachycardic and tachypneic.  Labs pertinent for creatinine of 1.33 with baseline of 1, T. bili of 1.8, procalcitonin at 19.76, lactic acid at 2.4>>3.1.  UA with microscopic hematuria, leukocytosis and rare bacteria 40. Chest x-ray negative for any acute abnormality.  CT renal stone study with multiple nonobstructing calculi within the collecting system of both kidneys measuring up to 5 mm in the lower pole of the right kidney.  No ureteric stones or urinary tract obstruction. Urine and blood cultures ordered. Patient was started on ceftriaxone.  TRH was consulted for admission.  8/27; patient appears to have little late immune response, worsening leukocytosis and procalcitonin today.  Blood cultures with E. Coli, suspecting urinary source.  Urine cultures pending. We will continue ceftriaxone.  Giving some more IV fluids with slight worsening of creatinine.   Assessment and Plan: * Severe sepsis ([place]) Met sepsis criteria with fever, tachycardia and tachypnea.  No leukocytosis.  Blood pressure soft, responded to IV fluid. Severe sepsis with lactic acidosis and AKI. Most likely secondary to UTI as patient is having dysuria, UA with hematuria  and mild leukocytosis. Procalcitonin elevated at 19.76>>82.38. Lactic acid 2.4>>3.1>>4.7>>3.0>>1.2 1/4 blood cultures with E. Coli. -Continue with ceftriaxone. -Follow-up urine and blood cultures  Diarrhea Patient developed watery diarrhea overnight. Complaining of some abdominal pain. Due to worsening leukocytosis which checked C. difficile and GI pathogen panel which was negative. May be secondary to antibiotic use. -Adding some probiotic -Supportive care  AKI (acute kidney injury) ([place]) Most likely secondary to sepsis.  Currently seems stable.  Baseline around 1 -Continue with IV hydration -Monitor renal function  HTN (hypertension) Blood pressure within goal today. -Restart home propranolol-as having mild tachycardia. -Continue holding spironolactone as getting some fluid for AKI  Depression, major, recurrent, moderate (HCC) - Continue home Celexa and Wellbutrin  Tremor - Holding home propranolol for softer blood pressure-can resume if blood pressure improves  Nephrolithiasis Patient with known history of nephrolithiasis for which she followed up with urology as an outpatient. No concern of obstruction. -Continue outpatient urology follow-up   Subjective: Patient is complaining of watery diarrhea as since last night, also experiencing some nausea and abdominal pain.  Overall feeling weak.  Physical Exam: Vitals:   11/28/21 0058 11/28/21 0215 11/28/21 0547 11/28/21 0827  BP: (!) 91/57 (!) 91/59 107/60 114/63  Pulse: 81 85 (!) 101 95  Resp: 20 20 20 18   Temp: 98.6 F (37 C) 98.4 F (36.9 C) 99 F (37.2 C) 98.8 F (37.1 C)  TempSrc:      SpO2: 98% 98% 96% 94%  Weight:      Height:       General.  Obese lady, in no acute distress. Pulmonary.  Lungs clear bilaterally, normal respiratory effort. CV.  Regular rate and rhythm, no JVD, rub or murmur. Abdomen.  Soft, nontender, nondistended, BS positive. CNS.  Alert and oriented .  No focal neurologic  deficit. Extremities.  No edema, no cyanosis, pulses intact and symmetrical. Psychiatry.  Judgment and insight appears normal.  Data Reviewed: Prior data reviewed  Family Communication: Discussed with daughter and husband at bedside  Disposition: Status is: Inpatient Remains inpatient appropriate because: Severity of illness   Planned Discharge Destination: Home  DVT prophylaxis.  Lovenox Time spent: 50 minutes  This record has been created using Systems analyst. Errors have been sought and corrected,but may not always be located. Such creation errors do not reflect on the standard of care.  Author: Lorella Nimrod, MD 11/28/2021 2:39 PM  For on call review [email or web address].

## 2021-11-28 NOTE — Assessment & Plan Note (Signed)
Patient developed watery diarrhea overnight. Complaining of some abdominal pain. Due to worsening leukocytosis which checked C. difficile and GI pathogen panel which was negative. May be secondary to antibiotic use. -Adding some probiotic -Supportive care

## 2021-11-28 NOTE — Progress Notes (Signed)
This RN assessed patient because the daughter stated her mother looks "worse." Patient Aox4 and complain of back pain and new BLE pain. She also reports chills, nausea, and numb fingertips. Dr. Sidney Ace made aware and Ativan and additional tylenol ordered.    11/27/21 2036  Assess: MEWS Score  Temp 99.2 F (37.3 C)  BP 131/78  MAP (mmHg) 92  Pulse Rate (!) 112  ECG Heart Rate (!) 110  Resp (!) 30  SpO2 93 %  O2 Device Room Air  Assess: MEWS Score  MEWS Temp 0  MEWS Systolic 0  MEWS Pulse 1  MEWS RR 2  MEWS LOC 0  MEWS Score 3  MEWS Score Color Yellow  Assess: if the MEWS score is Yellow or Red  Were vital signs taken at a resting state? No  Focused Assessment No change from prior assessment  Does the patient meet 2 or more of the SIRS criteria? Yes  Does the patient have a confirmed or suspected source of infection? Yes  Provider and Rapid Response Notified? No (No official rapid respose called,just rapid response RN)  MEWS guidelines implemented *See Row Information* No, vital signs rechecked  Treat  MEWS Interventions Administered scheduled meds/treatments  Notify: Charge Nurse/RN  Name of Charge Nurse/RN Notified Jla Reynolds, RN  Date Charge Nurse/RN Notified 11/27/21  Time Charge Nurse/RN Notified 2036  Notify: Provider  Provider Name/Title Eugenie Norrie, MD  Date Provider Notified 11/27/21  Time Provider Notified 2041  Method of Notification  (epic secure chat)  Notification Reason Other (Comment) (MEWS Yellow)  Provider response See new orders  Date of Provider Response 11/27/21  Time of Provider Response 2044  Notify: Rapid Response  Name of Rapid Response RN Notified Erica (at bedside)  Date Rapid Response Notified 11/27/21  Time Rapid Response Notified 2050  Document  Patient Outcome Stabilized after interventions  Progress note created (see row info) Yes  Assess: SIRS CRITERIA  SIRS Temperature  0  SIRS Pulse 1  SIRS Respirations  1  SIRS WBC 1  SIRS Score  Sum  3

## 2021-11-28 NOTE — Progress Notes (Signed)
       CROSS COVER NOTE  NAME: Tracie Sheppard MRN: [id number] DOB : 05-11-1955    Date of Service   11/28/2021   HPI/Events of Note   Message received from nursing reporting patient is requesting medication for breakthrough pain. She is currently reporting 7/10 chronic back pain.   Interventions   Plan: Toradol x1 Flexeril x1 Lidocaine patch      This document was prepared using Dragon voice recognition software and may include unintentional dictation errors.  Neomia Glass DNP, MHA, FNP-BC Nurse Practitioner [facility] Pager [phone]

## 2021-11-28 NOTE — Plan of Care (Signed)

## 2021-11-28 NOTE — Plan of Care (Signed)

## 2021-11-29 ENCOUNTER — Inpatient Hospital Stay: Payer: BC Managed Care – PPO

## 2021-11-29 DIAGNOSIS — R6520 Severe sepsis without septic shock: Secondary | ICD-10-CM | POA: Diagnosis not present

## 2021-11-29 DIAGNOSIS — A419 Sepsis, unspecified organism: Secondary | ICD-10-CM | POA: Diagnosis not present

## 2021-11-29 LAB — CBC
HCT: 33.1 % — ABNORMAL LOW (ref 36.0–46.0)
Hemoglobin: 10.8 g/dL — ABNORMAL LOW (ref 12.0–15.0)
MCH: 29.7 pg (ref 26.0–34.0)
MCHC: 32.6 g/dL (ref 30.0–36.0)
MCV: 90.9 fL (ref 80.0–100.0)
Platelets: 149 10*3/uL — ABNORMAL LOW (ref 150–400)
RBC: 3.64 MIL/uL — ABNORMAL LOW (ref 3.87–5.11)
RDW: 13.1 % (ref 11.5–15.5)
WBC: 15.3 10*3/uL — ABNORMAL HIGH (ref 4.0–10.5)
nRBC: 0 % (ref 0.0–0.2)

## 2021-11-29 LAB — BASIC METABOLIC PANEL
Anion gap: 6 (ref 5–15)
BUN: 22 mg/dL (ref 8–23)
CO2: 27 mmol/L (ref 22–32)
Calcium: 8.3 mg/dL — ABNORMAL LOW (ref 8.9–10.3)
Chloride: 106 mmol/L (ref 98–111)
Creatinine, Ser: 1.03 mg/dL — ABNORMAL HIGH (ref 0.44–1.00)
GFR, Estimated: >60 mL/min (ref >60)
Glucose, Bld: 89 mg/dL (ref 70–99)
Potassium: 4.2 mmol/L (ref 3.5–5.1)
Sodium: 139 mmol/L (ref 135–145)

## 2021-11-29 LAB — PROCALCITONIN: Procalcitonin: 48.3 ng/mL

## 2021-11-29 LAB — GLUCOSE, CAPILLARY: Glucose-Capillary: 85 mg/dL (ref 70–99)

## 2021-11-29 MED ORDER — CYCLOBENZAPRINE HCL 5 MG PO TABS
7.5000 mg | ORAL_TABLET | Freq: Once | ORAL | Status: DC
  Filled 2021-11-29: qty 1.5

## 2021-11-29 MED ORDER — ENOXAPARIN SODIUM 60 MG/0.6ML IJ SOSY
0.5000 mg/kg | PREFILLED_SYRINGE | INTRAMUSCULAR | Status: DC
  Administered 2021-11-29 – 2021-11-30 (×2): 47.5 mg via SUBCUTANEOUS
  Filled 2021-11-29 (×2): qty 0.6

## 2021-11-29 MED ORDER — ALBUTEROL SULFATE (2.5 MG/3ML) 0.083% IN NEBU
2.5000 mg | INHALATION_SOLUTION | RESPIRATORY_TRACT | Status: DC | PRN
  Administered 2021-11-29 – 2021-11-30 (×3): 2.5 mg via RESPIRATORY_TRACT
  Filled 2021-11-29 (×3): qty 3

## 2021-11-29 MED ORDER — PROCHLORPERAZINE EDISYLATE 10 MG/2ML IJ SOLN
10.0000 mg | Freq: Four times a day (QID) | INTRAMUSCULAR | Status: DC | PRN
  Administered 2021-11-29 – 2021-11-30 (×3): 10 mg via INTRAVENOUS
  Filled 2021-11-29 (×4): qty 2

## 2021-11-29 MED ORDER — IOHEXOL 9 MG/ML PO SOLN
500.0000 mL | ORAL | Status: AC
  Administered 2021-11-29: 500 mL via ORAL

## 2021-11-29 NOTE — Assessment & Plan Note (Signed)
Patient developed watery diarrhea overnight. Complaining of some abdominal pain. Due to worsening leukocytosis which checked C. difficile and GI pathogen panel which was negative. May be secondary to antibiotic use. CT abdomen and pelvis was done-please see the report. -Continue probiotic -Supportive care

## 2021-11-29 NOTE — TOC Initial Note (Signed)
Transition of Care [facility]) - Initial/Assessment Note    Patient Details  Name: Tracie Sheppard MRN: [id number] Date of Birth: 1956/01/28  Transition of Care [facility]) CM/SW Contact:    Conception Oms, RN Phone Number: 11/29/2021, 12:46 PM  Clinical Narrative:                  Transition of Care [facility]) Screening Note   Patient Details  Name: Tracie Sheppard Date of Birth: [birth year]   Transition of Care [facility]) CM/SW Contact:    Conception Oms, RN Phone Number: 11/29/2021, 12:46 PM  PCP [place] pharmacy for meds Spouse to transport  Transition of Care Department [facility]) has reviewed patient and no TOC needs have been identified at this time. We will continue to monitor patient advancement through interdisciplinary progression rounds. If new patient transition needs arise, please place a TOC consult.          Patient Goals and CMS Choice        Expected Discharge Plan and Services                                                Prior Living Arrangements/Services                       Activities of Daily Living Home Assistive Devices/Equipment: None ADL Screening (condition at time of admission) Patient's cognitive ability adequate to safely complete daily activities?: Yes Is the patient deaf or have difficulty hearing?: No Does the patient have difficulty seeing, even when wearing glasses/contacts?: No Does the patient have difficulty concentrating, remembering, or making decisions?: No Patient able to express need for assistance with ADLs?: Yes Does the patient have difficulty dressing or bathing?: No Independently performs ADLs?: Yes (appropriate for developmental age) Does the patient have difficulty walking or climbing stairs?: No Weakness of Legs: None Weakness of Arms/Hands: None  Permission Sought/Granted                  Emotional Assessment              Admission diagnosis:  Dysuria [R30.0] Severe sepsis ([place])  [A41.9, R65.20] Sepsis without acute organ dysfunction, due to unspecified organism [facility]) [A41.9] Patient Active Problem List   Diagnosis Date Noted   Diarrhea 11/28/2021   Severe sepsis ([place]) 11/27/2021   AKI (acute kidney injury) ([place]) 11/27/2021   Nephrolithiasis 11/27/2021   Tremor 01/02/2020   HTN (hypertension) 09/12/2019   OSA (obstructive sleep apnea) 03/05/2019   Dysphagia    Statin intolerance 07/25/2017   Bilateral carpal tunnel syndrome 05/30/2017   Depression, major, recurrent, moderate ([place]) 12/06/2016   Acute anxiety 08/24/2015   Hyperlipidemia 08/24/2015   Thoracic outlet syndrome 06/01/2015   Elevated rheumatoid factor 04/17/2015   Thyroid disease    Hematuria 01/21/2015   Low back pain 11/01/2014   PCP:  Valerie Roys, DO Pharmacy:   [id number]: [phone]: [phone]    Social Determinants of Health (SDOH) Interventions    Readmission Risk Interventions     No data to display

## 2021-11-29 NOTE — Assessment & Plan Note (Signed)
Patient with known history of nephrolithiasis for which she followed up with urology as an outpatient. No concern of obstruction. Repeat CT abdomen with some concern of hemorrhage and prior renal cyst. -Continue outpatient urology follow-up

## 2021-11-29 NOTE — Progress Notes (Signed)
Progress Note   Patient: Tracie Sheppard DOB: December 06, 1955 DOA: 11/27/2021     2 DOS: the patient was seen and examined on 11/29/2021   Brief hospital course: Tracie Sheppard is a 66 y.o. female with medical history significant of hypertension, essential tremors which is being managed by neurology, anxiety and depression and nephrolithiasis came to ED with complaint of lower abdominal pain, lower back pain, bilateral flank pain, dysuria along with fever and chills for the past 2 days. Patient was also experiencing some nausea and vomiting since yesterday.  No diarrhea. Patient denies any upper respiratory symptoms, chest pain or shortness of breath. No change in her appetite or bowel habits.  ED course.  On arrival patient was febrile at 101.8, tachycardic and tachypneic.  Labs pertinent for creatinine of 1.33 with baseline of 1, T. bili of 1.8, procalcitonin at 19.76, lactic acid at 2.4>>3.1.  UA with microscopic hematuria, leukocytosis and rare bacteria 40. Chest x-ray negative for any acute abnormality.  CT renal stone study with multiple nonobstructing calculi within the collecting system of both kidneys measuring up to 5 mm in the lower pole of the right kidney.  No ureteric stones or urinary tract obstruction. Urine and blood cultures ordered. Patient was started on ceftriaxone.  TRH was consulted for admission.  8/27; patient appears to have little late immune response, worsening leukocytosis and procalcitonin today.  Blood cultures with E. Coli, suspecting urinary source.  Urine cultures pending. We will continue ceftriaxone.  Giving some more IV fluids with slight worsening of creatinine. Patient was having loose watery stool since last night.  As there was worsening of leukocytosis will check for C. difficile and GI pathogen panel and they were negative. Most likely secondary to antibiotic use-probiotic added.  8/28: Patient continued to have some diarrhea although  improving.  Continued to have abdominal pain.  CT abdomen and pelvis was done which shows some hemorrhagic conversion of her prior renal cyst and some perinephric stranding.  Multiple small bilateral nonobstructing renal calculi. Urine cultures negative.  Blood cultures with E. coli still pending susceptibility. Patient need to follow-up with her urologist after this discharge for further management of the CT findings.   Assessment and Plan: * Severe sepsis (Tracie Sheppard) Met sepsis criteria with fever, tachycardia and tachypnea.  No leukocytosis.  Blood pressure soft, responded to IV fluid. Severe sepsis with lactic acidosis and AKI. Most likely secondary to UTI as patient is having dysuria, UA with hematuria and mild leukocytosis.  Urine cultures negative. Procalcitonin elevated at 19.76>>82.38>>48.30. Lactic acid 2.4>>3.1>>4.7>>3.0>>1.2 1/4 blood cultures with E. Coli-still pending susceptibility. -Continue with ceftriaxone. -Continue with supportive care  Diarrhea Patient developed watery diarrhea overnight. Complaining of some abdominal pain. Due to worsening leukocytosis which checked C. difficile and GI pathogen panel which was negative. May be secondary to antibiotic use. CT abdomen and pelvis was done-please see the report. -Continue probiotic -Supportive care  AKI (acute kidney injury) (Tracie Sheppard) Most likely secondary to sepsis.  Improved.  baseline around 1 -Continue with IV hydration-due to poor p.o. intake -Monitor renal function  HTN (hypertension) Blood pressure within goal today. -Restart home propranolol-as having mild tachycardia. -Continue holding spironolactone as getting some fluid for AKI  Depression, major, recurrent, moderate (HCC) - Continue home Celexa and Wellbutrin  Tremor - Holding home propranolol for softer blood pressure-can resume if blood pressure improves  Nephrolithiasis Patient with known history of nephrolithiasis for which she followed up with  urology as an outpatient. No concern of  obstruction. Repeat CT abdomen with some concern of hemorrhage and prior renal cyst. -Continue outpatient urology follow-up   Subjective: Patient was seen and examined today.  Continued to have significant nausea and poor p.o. intake.  She continued to have some belly pain, including bilateral flank pain.  Still having some diarrhea, 3 bowel movements since this morning.  Physical Exam: Vitals:   11/28/21 1651 11/28/21 2143 11/29/21 0436 11/29/21 0919  BP: 124/72 114/63 139/86 (!) 149/87  Pulse: 88 77 72 79  Resp: 18 18 17 16   Temp: 98 F (36.7 C) 98.1 F (36.7 C) 98 F (36.7 C) 98 F (36.7 C)  TempSrc: Oral     SpO2: 98% 94% 95% 99%  Weight:      Height:       General.  Obese lady, in no acute distress. Pulmonary.  Lungs clear bilaterally, normal respiratory effort. CV.  Regular rate and rhythm, no JVD, rub or murmur. Abdomen.  Soft, nontender, nondistended, BS positive. CNS.  Alert and oriented .  No focal neurologic deficit. Extremities.  No edema, no cyanosis, pulses intact and symmetrical. Psychiatry.  Judgment and insight appears normal.  Data Reviewed: Prior data reviewed  Family Communication: Discussed with daughter at bedside  Disposition: Status is: Inpatient Remains inpatient appropriate because: Severity of illness   Planned Discharge Destination: Home  Time spent: 50 minutes  This record has been created using Systems analyst. Errors have been sought and corrected,but may not always be located. Such creation errors do not reflect on the standard of care.  Author: Lorella Nimrod, MD 11/29/2021 2:39 PM  For on call review [email or web address].

## 2021-11-29 NOTE — Plan of Care (Signed)

## 2021-11-29 NOTE — Progress Notes (Signed)
PHARMACIST - PHYSICIAN COMMUNICATION  CONCERNING:  Enoxaparin (Lovenox) for DVT Prophylaxis    RECOMMENDATION: Patient was prescribed enoxaprin '40mg'$  q24 hours for VTE prophylaxis.   Filed Weights   11/27/21 0633  Weight: 94.8 kg (209 lb)    Body mass index is 35.87 kg/m.  Estimated Creatinine Clearance: 62.4 mL/min (A) (by C-G formula based on SCr of 1.03 mg/dL (H)).   Based on [place] patient is candidate for enoxaparin 0.'5mg'$ /kg TBW SQ every 24 hours based on BMI being >30.  DESCRIPTION: Pharmacy has adjusted enoxaparin dose per [facility] policy.  Patient is now receiving enoxaparin 0.5 mg/kg every 24 hours    Dallie Piles, PharmD Clinical Pharmacist  11/29/2021 8:45 AM

## 2021-11-29 NOTE — Assessment & Plan Note (Addendum)
Most likely secondary to sepsis.  Improved.  baseline around 1 -Continue with IV hydration-due to poor p.o. intake -Monitor renal function

## 2021-11-29 NOTE — Assessment & Plan Note (Signed)
Met sepsis criteria with fever, tachycardia and tachypnea.  No leukocytosis.  Blood pressure soft, responded to IV fluid. Severe sepsis with lactic acidosis and AKI. Most likely secondary to UTI as patient is having dysuria, UA with hematuria and mild leukocytosis.  Urine cultures negative. Procalcitonin elevated at 19.76>>82.38>>48.30. Lactic acid 2.4>>3.1>>4.7>>3.0>>1.2 1/4 blood cultures with E. Coli-still pending susceptibility. -Continue with ceftriaxone. -Continue with supportive care

## 2021-11-29 NOTE — Progress Notes (Signed)
       CROSS COVER NOTE  NAME: Tracie Sheppard MRN: [id number] DOB : 05-26-1955    Date of Service   11/29/2021   HPI/Events of Note   Message received from nursing for patient request for Flexeril. Patient takes Flexeril 5-10 mg PO qHS at home.  Interventions   Plan: Flexeril x1 Please address home meds with patient on rounds tomorrow       This document was prepared using Dragon voice recognition software and may include unintentional dictation errors.  Neomia Glass DNP, MHA, FNP-BC Nurse Practitioner [facility] Pager [phone]

## 2021-11-29 NOTE — Plan of Care (Signed)
  Problem: Education: Goal: Knowledge of General Education information will improve Description: Including pain rating scale, medication(s)/side effects and non-pharmacologic comfort measures Outcome: Progressing   Problem: Health Behavior/Discharge Planning: Goal: Ability to manage health-related needs will improve Outcome: Progressing   Problem: Clinical Measurements: Goal: Ability to maintain clinical measurements within normal limits will improve Outcome: Progressing   Problem: Clinical Measurements: Goal: Respiratory complications will improve Outcome: Progressing   Problem: Activity: Goal: Risk for activity intolerance will decrease Outcome: Progressing   Problem: Pain Managment: Goal: General experience of comfort will improve Outcome: Progressing   Problem: Safety: Goal: Ability to remain free from injury will improve Outcome: Progressing   Problem: Skin Integrity: Goal: Risk for impaired skin integrity will decrease Outcome: Progressing

## 2021-11-30 DIAGNOSIS — R6520 Severe sepsis without septic shock: Secondary | ICD-10-CM | POA: Diagnosis not present

## 2021-11-30 DIAGNOSIS — R0602 Shortness of breath: Secondary | ICD-10-CM

## 2021-11-30 DIAGNOSIS — A419 Sepsis, unspecified organism: Secondary | ICD-10-CM | POA: Diagnosis not present

## 2021-11-30 LAB — GLUCOSE, CAPILLARY
Glucose-Capillary: 72 mg/dL (ref 70–99)
Glucose-Capillary: 79 mg/dL (ref 70–99)

## 2021-11-30 LAB — BASIC METABOLIC PANEL
Anion gap: 6 (ref 5–15)
BUN: 22 mg/dL (ref 8–23)
CO2: 24 mmol/L (ref 22–32)
Calcium: 8.2 mg/dL — ABNORMAL LOW (ref 8.9–10.3)
Chloride: 108 mmol/L (ref 98–111)
Creatinine, Ser: 0.94 mg/dL (ref 0.44–1.00)
GFR, Estimated: >60 mL/min (ref >60)
Glucose, Bld: 80 mg/dL (ref 70–99)
Potassium: 3.7 mmol/L (ref 3.5–5.1)
Sodium: 138 mmol/L (ref 135–145)

## 2021-11-30 LAB — CBC
HCT: 32.6 % — ABNORMAL LOW (ref 36.0–46.0)
Hemoglobin: 10.4 g/dL — ABNORMAL LOW (ref 12.0–15.0)
MCH: 28.3 pg (ref 26.0–34.0)
MCHC: 31.9 g/dL (ref 30.0–36.0)
MCV: 88.8 fL (ref 80.0–100.0)
Platelets: 167 10*3/uL (ref 150–400)
RBC: 3.67 MIL/uL — ABNORMAL LOW (ref 3.87–5.11)
RDW: 13 % (ref 11.5–15.5)
WBC: 12.9 10*3/uL — ABNORMAL HIGH (ref 4.0–10.5)
nRBC: 0 % (ref 0.0–0.2)

## 2021-11-30 LAB — CULTURE, BLOOD (ROUTINE X 2): Special Requests: ADEQUATE

## 2021-11-30 MED ORDER — CEFADROXIL 500 MG PO CAPS
1000.0000 mg | ORAL_CAPSULE | Freq: Two times a day (BID) | ORAL | Status: DC
Start: 2021-12-01 — End: 2021-12-01
  Filled 2021-11-30: qty 2

## 2021-11-30 MED ORDER — FUROSEMIDE 10 MG/ML IJ SOLN
40.0000 mg | Freq: Once | INTRAMUSCULAR | Status: AC
  Administered 2021-11-30: 40 mg via INTRAVENOUS
  Filled 2021-11-30: qty 4

## 2021-11-30 NOTE — Assessment & Plan Note (Signed)
Met sepsis criteria with fever, tachycardia and tachypnea.  No leukocytosis.  Blood pressure soft, responded to IV fluid. Severe sepsis with lactic acidosis and AKI. Most likely secondary to UTI as patient is having dysuria, UA with hematuria and mild leukocytosis.  Urine cultures negative. Procalcitonin elevated at 19.76>>82.38>>48.30. Lactic acid 2.4>>3.1>>4.7>>3.0>>1.2 Blood cultures with pansensitive E. Coli. Patient initially received ceftriaxone and now being transitioned to cefadroxil 1000 mg twice daily for total of 2 weeks. -Continue with supportive care

## 2021-11-30 NOTE — Plan of Care (Signed)

## 2021-11-30 NOTE — Assessment & Plan Note (Signed)
Improving. Patient developed watery diarrhea overnight. Complaining of some abdominal pain. Due to worsening leukocytosis which checked C. difficile and GI pathogen panel which was negative. May be secondary to antibiotic use. CT abdomen and pelvis was done-please see the report. -Continue probiotic -Supportive care

## 2021-11-30 NOTE — Progress Notes (Signed)
Pt ambulating with mobilty tech, O2 dropped to 78%, pt place on 2L nasal cannula. MD Notified.

## 2021-11-30 NOTE — Assessment & Plan Note (Signed)
Chest x-ray concerning for some pulmonary edema.  Patient received quite a bit of fluid due to sepsis and poor p.o. intake. -IV Lasix 40 mg once -Continue to monitor

## 2021-11-30 NOTE — Progress Notes (Addendum)
Mobility Specialist - Progress Note  During mobility: HR,SpO2 76% (RA) 96% (2L O2) Post-mobility (2L O2): SPO2 98%    11/30/21 1038  Mobility  Activity Ambulated independently in hallway  Level of Assistance Modified independent, requires aide device or extra time  Assistive Device None  Distance Ambulated (ft) 180 ft  Activity Response Tolerated well  $Mobility charge 1 Mobility   Pt sitting in chair upon entry utilizing RA. Pt ambulated 140 ft around NS before O2 dropping to >80%. Pt sat in chair in hallway before [place] was brought by RN. Pt was placed on 2L O2, after approximately 2 mins O2 elevated to 96%. Pt ambulated 40 ft back to room and was left sitting in chair utilizing 2L O2. Needs within reach and family present at bedside.   Candie Mile Mobility Specialist 11/30/21 10:45 AM

## 2021-11-30 NOTE — Progress Notes (Signed)
Progress Note   Patient: Tracie Sheppard ZOX:[id number] DOB: [birth year] DOA: 11/27/2021     3 DOS: the patient was seen and examined on 11/30/2021   Brief hospital course: Tracie Sheppard is a 66 y.o. female with medical history significant of hypertension, essential tremors which is being managed by neurology, anxiety and depression and nephrolithiasis came to ED with complaint of lower abdominal pain, lower back pain, bilateral flank pain, dysuria along with fever and chills for the past 2 days. Patient was also experiencing some nausea and vomiting since yesterday.  No diarrhea. Patient denies any upper respiratory symptoms, chest pain or shortness of breath. No change in her appetite or bowel habits.  ED course.  On arrival patient was febrile at 101.8, tachycardic and tachypneic.  Labs pertinent for creatinine of 1.33 with baseline of 1, T. bili of 1.8, procalcitonin at 19.76, lactic acid at 2.4>>3.1.  UA with microscopic hematuria, leukocytosis and rare bacteria 40. Chest x-ray negative for any acute abnormality.  CT renal stone study with multiple nonobstructing calculi within the collecting system of both kidneys measuring up to 5 mm in the lower pole of the right kidney.  No ureteric stones or urinary tract obstruction. Urine and blood cultures ordered. Patient was started on ceftriaxone.  TRH was consulted for admission.  8/27; patient appears to have little late immune response, worsening leukocytosis and procalcitonin today.  Blood cultures with E. Coli, suspecting urinary source.  Urine cultures pending. We will continue ceftriaxone.  Giving some more IV fluids with slight worsening of creatinine. Patient was having loose watery stool since last night.  As there was worsening of leukocytosis will check for C. difficile and GI pathogen panel and they were negative. Most likely secondary to antibiotic use-probiotic added.  8/28: Patient continued to have some diarrhea although  improving.  Continued to have abdominal pain.  CT abdomen and pelvis was done which shows some hemorrhagic conversion of her prior renal cyst and some perinephric stranding.  Multiple small bilateral nonobstructing renal calculi. Urine cultures negative.  Blood cultures with E. coli still pending susceptibility. Patient need to follow-up with her urologist after this discharge for further management of the CT findings.  8/29: Patient developed some shortness of breath yesterday evening.  Chest x-ray with some interval increase in diffuse bilateral opacities, infectious versus pulmonary edema.  Patient did received IV fluid.  IV fluids were discontinued.  Remained on room air with transient desaturation with ambulation.  AKI has been resolved.  Labs overall improving.  Giving some IV Lasix. Final blood cultures with pansensitive E. coli, antibiotics switched to cefadroxil 1000 mg twice daily for total of 2 weeks. If continue to improve can be discharged tomorrow.   Assessment and Plan: * Severe sepsis ([place]) Met sepsis criteria with fever, tachycardia and tachypnea.  No leukocytosis.  Blood pressure soft, responded to IV fluid. Severe sepsis with lactic acidosis and AKI. Most likely secondary to UTI as patient is having dysuria, UA with hematuria and mild leukocytosis.  Urine cultures negative. Procalcitonin elevated at 19.76>>82.38>>48.30. Lactic acid 2.4>>3.1>>4.7>>3.0>>1.2 Blood cultures with pansensitive E. Coli. Patient initially received ceftriaxone and now being transitioned to cefadroxil 1000 mg twice daily for total of 2 weeks. -Continue with supportive care  Shortness of breath Chest x-ray concerning for some pulmonary edema.  Patient received quite a bit of fluid due to sepsis and poor p.o. intake. -IV Lasix 40 mg once -Continue to monitor  Diarrhea Improving. Patient developed watery diarrhea overnight. Complaining of  some abdominal pain. Due to worsening leukocytosis which  checked C. difficile and GI pathogen panel which was negative. May be secondary to antibiotic use. CT abdomen and pelvis was done-please see the report. -Continue probiotic -Supportive care  AKI (acute kidney injury) ([place]) Most likely secondary to sepsis.  Improved.  baseline around 1 -Continue with IV hydration-due to poor p.o. intake -Monitor renal function  HTN (hypertension) Blood pressure within goal today. -Restart home propranolol-as having mild tachycardia. -Continue holding spironolactone as getting some fluid for AKI  Depression, major, recurrent, moderate ([facility]) - Continue home Celexa and Wellbutrin  Tremor - Holding home propranolol for softer blood pressure-can resume if blood pressure improves  Nephrolithiasis Patient with known history of nephrolithiasis for which she followed up with urology as an outpatient. No concern of obstruction. Repeat CT abdomen with some concern of hemorrhage and prior renal cyst. -Continue outpatient urology follow-up   Subjective: Patient was seen and examined today.  She was still feeling little short of breath which increased with ambulation.  Physical Exam: Vitals:   11/30/21 1040 11/30/21 1204 11/30/21 1424 11/30/21 1541  BP:    129/81  Pulse:    72  Resp:    17  Temp:    98.4 F (36.9 C)  TempSrc:      SpO2: 98% 98% 98% 99%  Weight:      Height:       General.  Obese lady, in no acute distress. Pulmonary.  Lungs clear bilaterally, normal respiratory effort. CV.  Regular rate and rhythm, no JVD, rub or murmur. Abdomen.  Soft, nontender, nondistended, BS positive. CNS.  Alert and oriented .  No focal neurologic deficit. Extremities.  No edema, no cyanosis, pulses intact and symmetrical. Psychiatry.  Judgment and insight appears normal.  Data Reviewed: Prior data reviewed.  Family Communication: Discussed with daughter at bedside  Disposition: Status is: Inpatient Remains inpatient appropriate because: Severity  of illness   Planned Discharge Destination: Home  Time spent: 43 minutes  This record has been created using Systems analyst. Errors have been sought and corrected,but may not always be located. Such creation errors do not reflect on the standard of care.  Author: Lorella Nimrod, MD 11/30/2021 3:42 PM  For on call review [email or web address].

## 2021-11-30 NOTE — Progress Notes (Signed)
Mobility Specialist - Progress Note Pre-mobility: SpO2 98% During mobility: SpO2 92%-94% Post-mobility: SPO2 94%   11/30/21 1424  Oxygen Therapy  SpO2 98 %  O2 Device Nasal Cannula  O2 Flow Rate (L/min) 2 L/min  Mobility  Activity Ambulated independently in hallway;Ambulated independently in room  Level of Assistance Modified independent, requires aide device or extra time  Assistive Device None  Distance Ambulated (ft) 180 ft  Activity Response Tolerated well  $Mobility charge 1 Mobility   Pt semi supine upon entry, utilizing 2L O2. Pt ambulated one lap around NS, O2 staying within a range of 92%-94%. Pt left sitting EOB with needs in reach, family present at bedside. No complaints.   Candie [place] Mobility Specialist 11/30/21 2:29 PM

## 2021-12-01 ENCOUNTER — Encounter: Payer: Self-pay | Admitting: Internal Medicine

## 2021-12-01 DIAGNOSIS — N179 Acute kidney failure, unspecified: Secondary | ICD-10-CM

## 2021-12-01 DIAGNOSIS — A419 Sepsis, unspecified organism: Secondary | ICD-10-CM | POA: Diagnosis not present

## 2021-12-01 DIAGNOSIS — R0602 Shortness of breath: Secondary | ICD-10-CM | POA: Diagnosis not present

## 2021-12-01 DIAGNOSIS — R197 Diarrhea, unspecified: Secondary | ICD-10-CM | POA: Diagnosis not present

## 2021-12-01 LAB — GLUCOSE, CAPILLARY: Glucose-Capillary: 88 mg/dL (ref 70–99)

## 2021-12-01 MED ORDER — LEVOFLOXACIN 500 MG PO TABS
750.0000 mg | ORAL_TABLET | Freq: Every day | ORAL | Status: DC
Start: 2021-12-01 — End: 2021-12-01
  Administered 2021-12-01: 750 mg via ORAL
  Filled 2021-12-01: qty 2

## 2021-12-01 MED ORDER — LEVOFLOXACIN 750 MG PO TABS: 750.0000 mg | ORAL_TABLET | Freq: Every day | ORAL | 0 refills | Status: AC

## 2021-12-01 NOTE — Progress Notes (Signed)
DISCHARGE NOTE:  Pt given discharge instructions, and verbalized understanding. Pt wheeled to car by staff, daughter providing transportation.

## 2021-12-01 NOTE — Progress Notes (Signed)
Mobility Specialist - Progress Note   12/01/21 1100  Mobility  Activity Ambulated independently in hallway;Stood at bedside;Ambulated independently to bathroom;Dangled on edge of bed  Level of Assistance Independent  Distance Ambulated (ft) 200 ft  Activity Response Tolerated well  $Mobility charge 1 Mobility    Pre-mobility: 81 HR 92 SpO2 During mobility: 92 HR 91 SpO2 Post-mobility: 78 HR 86-92 SPO2 (Dropped to 86 when pt first sit on bed but rises back to 92 within 1 minute)   Pt supine on RA upon arrival. Pt STS and ambulates to restroom and 1 lap around NS Indep. Pt returns to bed with needs in reach and family in room.   Gretchen Short  Mobility Specialist  12/01/21 11:03 AM

## 2021-12-01 NOTE — Discharge Summary (Signed)
Physician Discharge Summary   Patient: RAINA SOLE MRN: [id number] DOB: [birth year]  Admit date:     11/27/2021  Discharge date: {dischdate:26783}  Discharge Physician: Jennye Boroughs   PCP: Valerie Roys, DO   Recommendations at discharge:  {Tip this will not be part of the note when signed- Example include specific recommendations for outpatient follow-up, pending tests to follow-up on. (Optional):26781}  ***  Discharge Diagnoses: Principal Problem:   Severe sepsis (HCC) Active Problems:   Shortness of breath   HTN (hypertension)   AKI (acute kidney injury) (HCC)   Diarrhea   Depression, major, recurrent, moderate (HCC)   Tremor   Nephrolithiasis  Resolved Problems:   * No resolved hospital problems. *  Hospital Course: JAYMI TINNER is a 66 y.o. female with medical history significant of hypertension, essential tremors which is being managed by neurology, anxiety and depression and nephrolithiasis came to ED with complaint of lower abdominal pain, lower back pain, bilateral flank pain, dysuria along with fever and chills for the past 2 days. Patient was also experiencing some nausea and vomiting since yesterday.  No diarrhea. Patient denies any upper respiratory symptoms, chest pain or shortness of breath. No change in her appetite or bowel habits.  ED course.  On arrival patient was febrile at 101.8, tachycardic and tachypneic.  Labs pertinent for creatinine of 1.33 with baseline of 1, T. bili of 1.8, procalcitonin at 19.76, lactic acid at 2.4>>3.1.  UA with microscopic hematuria, leukocytosis and rare bacteria 40. Chest x-ray negative for any acute abnormality.  CT renal stone study with multiple nonobstructing calculi within the collecting system of both kidneys measuring up to 5 mm in the lower pole of the right kidney.  No ureteric stones or urinary tract obstruction. Urine and blood cultures ordered. Patient was started on ceftriaxone.  TRH was consulted for  admission.  8/27; patient appears to have little late immune response, worsening leukocytosis and procalcitonin today.  Blood cultures with E. Coli, suspecting urinary source.  Urine cultures pending. We will continue ceftriaxone.  Giving some more IV fluids with slight worsening of creatinine. Patient was having loose watery stool since last night.  As there was worsening of leukocytosis will check for C. difficile and GI pathogen panel and they were negative. Most likely secondary to antibiotic use-probiotic added.  8/28: Patient continued to have some diarrhea although improving.  Continued to have abdominal pain.  CT abdomen and pelvis was done which shows some hemorrhagic conversion of her prior renal cyst and some perinephric stranding.  Multiple small bilateral nonobstructing renal calculi. Urine cultures negative.  Blood cultures with E. coli still pending susceptibility. Patient need to follow-up with her urologist after this discharge for further management of the CT findings.  8/29: Patient developed some shortness of breath yesterday evening.  Chest x-ray with some interval increase in diffuse bilateral opacities, infectious versus pulmonary edema.  Patient did received IV fluid.  IV fluids were discontinued.  Remained on room air with transient desaturation with ambulation.  AKI has been resolved.  Labs overall improving.  Giving some IV Lasix. Final blood cultures with pansensitive E. coli, antibiotics switched to cefadroxil 1000 mg twice daily for total of 2 weeks. If continue to improve can be discharged tomorrow.  Assessment and Plan: * Severe sepsis ([place]) Met sepsis criteria with fever, tachycardia and tachypnea.  No leukocytosis.  Blood pressure soft, responded to IV fluid. Severe sepsis with lactic acidosis and AKI. Most likely secondary to UTI as  patient is having dysuria, UA with hematuria and mild leukocytosis.  Urine cultures negative. Procalcitonin elevated at  19.76>>82.38>>48.30. Lactic acid 2.4>>3.1>>4.7>>3.0>>1.2 Blood cultures with pansensitive E. Coli. Patient initially received ceftriaxone and now being transitioned to cefadroxil 1000 mg twice daily for total of 2 weeks. -Continue with supportive care  Shortness of breath Chest x-ray concerning for some pulmonary edema.  Patient received quite a bit of fluid due to sepsis and poor p.o. intake. -IV Lasix 40 mg once -Continue to monitor  Diarrhea Improving. Patient developed watery diarrhea overnight. Complaining of some abdominal pain. Due to worsening leukocytosis which checked C. difficile and GI pathogen panel which was negative. May be secondary to antibiotic use. CT abdomen and pelvis was done-please see the report. -Continue probiotic -Supportive care  AKI (acute kidney injury) (HCC) Most likely secondary to sepsis.  Improved.  baseline around 1 -Continue with IV hydration-due to poor p.o. intake -Monitor renal function  HTN (hypertension) Blood pressure within goal today. -Restart home propranolol-as having mild tachycardia. -Continue holding spironolactone as getting some fluid for AKI  Depression, major, recurrent, moderate (HCC) - Continue home Celexa and Wellbutrin  Tremor - Holding home propranolol for softer blood pressure-can resume if blood pressure improves  Nephrolithiasis Patient with known history of nephrolithiasis for which she followed up with urology as an outpatient. No concern of obstruction. Repeat CT abdomen with some concern of hemorrhage and prior renal cyst. -Continue outpatient urology follow-up      {Tip this will not be part of the note when signed Body mass index is 35.87 kg/m. , ,  (Optional):26781}  {(NOTE) Pain control PDMP Statment (Optional):26782} Consultants: *** Procedures performed: ***  Disposition: {Plan; Disposition:26390} Diet recommendation:  Discharge Diet Orders (From admission, onward)     Start     Ordered    12/01/21 0000  Diet - low sodium heart healthy        12/01/21 1016           {Diet_Plan:26776} DISCHARGE MEDICATION: Allergies as of 12/01/2021       Reactions   Aleve [naproxen Sodium] Other (See Comments)   Chest pain   Statins Other (See Comments)   myalgias        Medication List     TAKE these medications    acetaminophen 500 MG tablet Commonly known as: TYLENOL Tylenol Extra Strength 500 mg tablet   albuterol 108 (90 Base) MCG/ACT inhaler Commonly known as: VENTOLIN HFA Inhale 2 puffs into the lungs every 6 (six) hours as needed for wheezing or shortness of breath.   Alpha-Lipoic Acid 600 MG Caps Take 600 mg by mouth daily.   buPROPion 300 MG 24 hr tablet Commonly known as: WELLBUTRIN XL Take 1 tablet (300 mg total) by mouth daily.   cetirizine 10 MG tablet Commonly known as: ZYRTEC Take 1 tablet (10 mg total) by mouth daily as needed for allergies.   citalopram 40 MG tablet Commonly known as: CELEXA Take 1.5 tablets (60 mg total) by mouth daily.   clonazePAM 0.5 MG tablet Commonly known as: KLONOPIN TAKE 1 TABLET BY MOUTH DAILY AS NEEDED FOR ANIXETY What changed:  how much to take how to take this when to take this reasons to take this   Cyanocobalamin 1000 MCG Tbcr Take 1,000 mcg by mouth daily.   cyclobenzaprine 10 MG tablet Commonly known as: FLEXERIL Take 0.5-1 tablets (5-10 mg total) by mouth at bedtime.   diclofenac 75 MG EC tablet Commonly known as: VOLTAREN Take 1   tablet (75 mg total) by mouth 2 (two) times daily.   fluticasone 50 MCG/ACT nasal spray Commonly known as: FLONASE SPRAY 1 SPRAY INTO EACH NOSTRIL EVERY DAY   ibuprofen 200 MG tablet Commonly known as: ADVIL Take 200 mg by mouth as needed.   levofloxacin 750 MG tablet Commonly known as: LEVAQUIN Take 1 tablet (750 mg total) by mouth daily for 2 days. Start taking on: December 02, 2021   propranolol ER 60 MG 24 hr capsule Commonly known as: INDERAL LA Take 1  tablet by mouth daily.   spironolactone 100 MG tablet Commonly known as: ALDACTONE Take 1 tablet (100 mg total) by mouth 2 (two) times daily.   VITAMIN D PO Take by mouth daily. 1000 mcg        Discharge Exam: Filed Weights   11/27/21 0633  Weight: 94.8 kg   ***  Condition at discharge: {DC Condition:26389}  The results of significant diagnostics from this hospitalization (including imaging, microbiology, ancillary and laboratory) are listed below for reference.   Imaging Studies: DG Chest Port 1 View  Result Date: 11/29/2021 CLINICAL DATA:  Shortness of breath EXAM: PORTABLE CHEST 1 VIEW COMPARISON:  11/27/2021, 01/21/2021 FINDINGS: Increased diffuse bilateral interstitial and mild ground-glass opacity. No pleural effusion. Stable cardiomediastinal silhouette. No pneumothorax IMPRESSION: Low lung volumes. Interval increase in diffuse bilateral interstitial and mild ground-glass opacity which may be due to diffuse infection/interstitial inflammatory process versus edema. Electronically Signed   By: Kim  Fujinaga M.D.   On: 11/29/2021 19:50   CT ABDOMEN PELVIS WO CONTRAST  Result Date: 11/29/2021 CLINICAL DATA:  Sepsis EXAM: CT ABDOMEN AND PELVIS WITHOUT CONTRAST TECHNIQUE: Multidetector CT imaging of the abdomen and pelvis was performed following the standard protocol without IV contrast. RADIATION DOSE REDUCTION: This exam was performed according to the departmental dose-optimization program which includes automated exposure control, adjustment of the mA and/or kV according to patient size and/or use of iterative reconstruction technique. COMPARISON:  11/27/2021, 07/21/2021 FINDINGS: Lower chest: Trace right pleural effusion and associated atelectasis or consolidation. Hepatobiliary: No focal liver abnormality is seen. Status post cholecystectomy. No biliary dilatation. Pancreas: Unremarkable. No pancreatic ductal dilatation or surrounding inflammatory changes. Spleen: Normal in size  without significant abnormality. Adrenals/Urinary Tract: Adrenal glands are unremarkable. Multiple small bilateral nonobstructive renal calculi. No ureteral calculi or hydronephrosis. New hyperdensity within a previously seen simple appearing cyst of the anterior midportion of the right kidney, measuring 4.1 x 3.2 cm (series 2, image 36). New, mild right-sided perinephric fat stranding. Bladder is unremarkable. Stomach/Bowel: Stomach is within normal limits. Appendix appears normal. No evidence of bowel wall thickening, distention, or inflammatory changes. Vascular/Lymphatic: No significant vascular findings are present. No enlarged abdominal or pelvic lymph nodes. Reproductive: No mass or other significant abnormality. Other: Small, fat containing umbilical hernia.  No ascites. Musculoskeletal: No acute or significant osseous findings. IMPRESSION: 1. New hyperdensity within a previously seen simple appearing cyst of the anterior midportion of the right kidney, measuring 4.1 x 3.2 cm. This is consistent with hemorrhage within a renal cortical cyst, previously characterized as simple and benign (i.e. no underlying solid component or mass) by multiphasic contrast enhanced CT dated 07/21/2021. 2. Multiple small bilateral nonobstructive renal calculi. No ureteral calculi or hydronephrosis. 3. New, mild right-sided perinephric fat stranding, which may reflect infection or inflammation. Correlate with urinalysis. 4. Trace right pleural effusion and associated atelectasis or consolidation. Electronically Signed   By: Alex D Bibbey M.D.   On: 11/29/2021 10:39   DG Chest Port   1 View  Result Date: 11/27/2021 CLINICAL DATA:  Questionable sepsis EXAM: PORTABLE CHEST 1 VIEW COMPARISON:  01/21/2021 FINDINGS: Low volume chest which accentuates heart size that is otherwise stable appearing. Stable aortic and hilar contours. There is no edema, consolidation, effusion, or pneumothorax. No acute osseous finding. IMPRESSION: Low  volume chest without focal abnormality. Electronically Signed   By: Jonathan  Watts M.D.   On: 11/27/2021 07:03   CT Renal Stone Study  Result Date: 11/27/2021 CLINICAL DATA:  65-year-old female with history of urinary tract infection and sepsis. Suspected pyelonephritis. Dark foul-smelling urine for 1 week. EXAM: CT ABDOMEN AND PELVIS WITHOUT CONTRAST TECHNIQUE: Multidetector CT imaging of the abdomen and pelvis was performed following the standard protocol without IV contrast. RADIATION DOSE REDUCTION: This exam was performed according to the departmental dose-optimization program which includes automated exposure control, adjustment of the mA and/or kV according to patient size and/or use of iterative reconstruction technique. COMPARISON:  CT of the abdomen and pelvis 07/21/2021. FINDINGS: Lower chest: Unremarkable. Hepatobiliary: No definite suspicious cystic or solid hepatic lesions are confidently identified on today's noncontrast CT examination. Status post cholecystectomy. Pancreas: No definite pancreatic mass or peripancreatic fluid collections or inflammatory changes are noted on today's noncontrast CT examination. Spleen: Unremarkable. Adrenals/Urinary Tract: Multiple nonobstructive calculi are noted within the collecting systems of both kidneys measuring up to 5 mm in the lower pole collecting system of the right kidney. No additional calculi are identified along the course of either ureter, or within the lumen of the urinary bladder. No hydroureteronephrosis. In the interpolar region of the right kidney there is a 2.7 cm low-attenuation lesion, incompletely characterized on today's noncontrast CT examination, but similar to the prior study and statistically likely a cyst (no imaging follow-up is recommended). Left kidney is normal in appearance. Urinary bladder is unremarkable in appearance. Bilateral adrenal glands are normal in appearance. Stomach/Bowel: Unenhanced appearance of the stomach is  normal. There is no pathologic dilatation of small bowel or colon. Normal appendix. Vascular/Lymphatic: Aortic atherosclerosis (mild). No lymphadenopathy noted in the abdomen or pelvis. Reproductive: Uterus and ovaries are atrophic. Other: No significant volume of ascites.  No pneumoperitoneum. Musculoskeletal: There are no aggressive appearing lytic or blastic lesions noted in the visualized portions of the skeleton. IMPRESSION: 1. Multiple nonobstructive calculi are noted within the collecting systems of both kidneys measuring up to 5 mm in the lower pole collecting system of the right kidney. No ureteral stones or findings of urinary tract obstruction are noted at this time. 2. Mild aortic atherosclerosis. 3. Additional incidental findings, as above. Electronically Signed   By: Daniel  Entrikin M.D.   On: 11/27/2021 06:49    Microbiology: Results for orders placed or performed during the hospital encounter of 11/27/21  Blood Culture (routine x 2)     Status: None (Preliminary result)   Collection Time: 11/27/21  6:17 AM   Specimen: BLOOD  Result Value Ref Range Status   Specimen Description BLOOD BLOOD LEFT FOREARM  Final   Special Requests   Final    BOTTLES DRAWN AEROBIC AND ANAEROBIC Blood Culture results may not be optimal due to an excessive volume of blood received in culture bottles   Culture   Final    NO GROWTH 4 DAYS Performed at [facility], [address] [place], [place] [address]    Report Status PENDING  Incomplete  Blood Culture (routine x 2)     Status: Abnormal   Collection Time: 11/27/21  6:22 AM     Specimen: BLOOD LEFT HAND  Result Value Ref Range Status   Specimen Description   Final    BLOOD LEFT HAND Performed at [facility], [address] [place], [place] [address]    Special Requests   Final    BOTTLES DRAWN AEROBIC AND ANAEROBIC Blood Culture adequate volume Performed at [facility], [address] [place], [place] [address]     Culture  Setup Time   Final    Organism ID to follow GRAM NEGATIVE RODS AEROBIC BOTTLE ONLY CRITICAL RESULT CALLED TO, READ BACK BY AND VERIFIED WITH: CAROLINE CHILDS 11/27/21 2012 DE Performed at [facility], [address] [place], [place] [address]    Culture ESCHERICHIA COLI (A)  Final   Report Status 11/30/2021 FINAL  Final   Organism ID, Bacteria ESCHERICHIA COLI  Final      Susceptibility   Escherichia coli - MIC*    AMPICILLIN <=2 SENSITIVE Sensitive     CEFAZOLIN <=4 SENSITIVE Sensitive     CEFEPIME <=0.12 SENSITIVE Sensitive     CEFTAZIDIME <=1 SENSITIVE Sensitive     CEFTRIAXONE <=0.25 SENSITIVE Sensitive     CIPROFLOXACIN <=0.25 SENSITIVE Sensitive     GENTAMICIN <=1 SENSITIVE Sensitive     IMIPENEM <=0.25 SENSITIVE Sensitive     TRIMETH/SULFA <=20 SENSITIVE Sensitive     AMPICILLIN/SULBACTAM <=2 SENSITIVE Sensitive     PIP/TAZO <=4 SENSITIVE Sensitive     * ESCHERICHIA COLI  Blood Culture ID Panel (Reflexed)     Status: Abnormal (Preliminary result)   Collection Time: 11/27/21  6:22 AM  Result Value Ref Range Status   Enterococcus faecalis NOT DETECTED NOT DETECTED Final   Enterococcus Faecium NOT DETECTED NOT DETECTED Final   Listeria monocytogenes NOT DETECTED NOT DETECTED Final   Staphylococcus species NOT DETECTED NOT DETECTED Final   Staphylococcus aureus (BCID) NOT DETECTED NOT DETECTED Final   Staphylococcus epidermidis NOT DETECTED NOT DETECTED Final   Staphylococcus lugdunensis NOT DETECTED NOT DETECTED Final   Streptococcus species NOT DETECTED NOT DETECTED Final   Streptococcus agalactiae NOT DETECTED NOT DETECTED Final   Streptococcus pneumoniae NOT DETECTED NOT DETECTED Final   Streptococcus pyogenes NOT DETECTED NOT DETECTED Final   A.calcoaceticus-baumannii NOT DETECTED NOT DETECTED Final   Bacteroides fragilis NOT DETECTED NOT DETECTED Final   Enterobacterales PENDING NOT DETECTED Incomplete   Enterobacter cloacae complex NOT  DETECTED NOT DETECTED Final   Escherichia coli DETECTED (A) NOT DETECTED Final    Comment: CAROLINE CHILDS 11/27/21 2012 DE   Klebsiella aerogenes NOT DETECTED NOT DETECTED Final   Klebsiella oxytoca NOT DETECTED NOT DETECTED Final   Klebsiella pneumoniae NOT DETECTED NOT DETECTED Final   Proteus species NOT DETECTED NOT DETECTED Final   Salmonella species NOT DETECTED NOT DETECTED Final   Serratia marcescens NOT DETECTED NOT DETECTED Final   Haemophilus influenzae NOT DETECTED NOT DETECTED Final   Neisseria meningitidis NOT DETECTED NOT DETECTED Final   Pseudomonas aeruginosa NOT DETECTED NOT DETECTED Final   Stenotrophomonas maltophilia NOT DETECTED NOT DETECTED Final   Candida albicans NOT DETECTED NOT DETECTED Final   Candida auris NOT DETECTED NOT DETECTED Final   Candida glabrata NOT DETECTED NOT DETECTED Final   Candida krusei NOT DETECTED NOT DETECTED Final   Candida parapsilosis NOT DETECTED NOT DETECTED Final   Candida tropicalis NOT DETECTED NOT DETECTED Final   Cryptococcus neoformans/gattii NOT DETECTED NOT DETECTED Final   CTX-M ESBL NOT DETECTED NOT DETECTED Final   Carbapenem resistance IMP NOT DETECTED  NOT DETECTED Final   Carbapenem resistance KPC NOT DETECTED NOT DETECTED Final   Carbapenem resistance NDM NOT DETECTED NOT DETECTED Final   Carbapenem resist OXA 48 LIKE NOT DETECTED NOT DETECTED Final   Carbapenem resistance VIM NOT DETECTED NOT DETECTED Final    Comment: Performed at [facility], [address] [place], [place] [address]  Urine Culture     Status: None   Collection Time: 11/27/21  8:06 AM   Specimen: Urine, Clean Catch  Result Value Ref Range Status   Specimen Description   Final    URINE, CLEAN CATCH Performed at [facility], [address] [place], [place] [address]    Special Requests   Final    NONE Performed at [facility], [address] [place], [place] [address]    Culture   Final    NO  GROWTH Performed at [facility], [address] [place], [place] [address]    Report Status 11/28/2021 FINAL  Final  Gastrointestinal Panel by PCR , Stool     Status: None   Collection Time: 11/28/21 12:26 PM   Specimen: Stool  Result Value Ref Range Status   Campylobacter species NOT DETECTED NOT DETECTED Final   Plesimonas shigelloides NOT DETECTED NOT DETECTED Final   Salmonella species NOT DETECTED NOT DETECTED Final   Yersinia enterocolitica NOT DETECTED NOT DETECTED Final   Vibrio species NOT DETECTED NOT DETECTED Final   Vibrio cholerae NOT DETECTED NOT DETECTED Final   Enteroaggregative E coli (EAEC) NOT DETECTED NOT DETECTED Final   Enteropathogenic E coli (EPEC) NOT DETECTED NOT DETECTED Final   Enterotoxigenic E coli (ETEC) NOT DETECTED NOT DETECTED Final   Shiga like toxin producing E coli (STEC) NOT DETECTED NOT DETECTED Final   Shigella/Enteroinvasive E coli (EIEC) NOT DETECTED NOT DETECTED Final   Cryptosporidium NOT DETECTED NOT DETECTED Final   Cyclospora cayetanensis NOT DETECTED NOT DETECTED Final   Entamoeba histolytica NOT DETECTED NOT DETECTED Final   Giardia lamblia NOT DETECTED NOT DETECTED Final   Adenovirus F40/41 NOT DETECTED NOT DETECTED Final   Astrovirus NOT DETECTED NOT DETECTED Final   Norovirus GI/GII NOT DETECTED NOT DETECTED Final   Rotavirus A NOT DETECTED NOT DETECTED Final   Sapovirus (I, II, IV, and V) NOT DETECTED NOT DETECTED Final    Comment: Performed at [facility], [address] [place], [place] [address]  C Difficile Quick Screen w PCR reflex     Status: None   Collection Time: 11/28/21 12:26 PM   Specimen: Stool  Result Value Ref Range Status   C Diff antigen NEGATIVE NEGATIVE Final   C Diff toxin NEGATIVE NEGATIVE Final   C Diff interpretation No C. difficile detected.  Final    Comment: Performed at [facility], [address] [place], [place] [address]    Labs: CBC: Recent Labs  Lab  11/27/21 0617 11/28/21 0523 11/29/21 0316 11/30/21 0653  WBC 4.0 15.8* 15.3* 12.9*  NEUTROABS 3.5  --   --   --   HGB 13.1 10.7* 10.8* 10.4*  HCT 40.2 33.6* 33.1* 32.6*  MCV 90.5 89.6 90.9 88.8  PLT 174 127* 149* 167   Basic Metabolic Panel: Recent Labs  Lab 11/27/21 0617 11/28/21 0523 11/29/21 0316 11/30/21 0653  NA 138 139 139 138  K 3.6 4.1 4.2 3.7  CL 108 108 106 108  CO2 22 27 27 24  GLUCOSE 132* 81 89 80  BUN 20 25*   22 22  CREATININE 1.33* 1.37* 1.03* 0.94  CALCIUM 8.9 8.2* 8.3* 8.2*   Liver Function Tests: Recent Labs  Lab 11/27/21 0617  AST 32  ALT 32  ALKPHOS 88  BILITOT 1.8*  PROT 7.0  ALBUMIN 3.8   CBG: Recent Labs  Lab 11/28/21 0828 11/29/21 1214 11/30/21 0741 11/30/21 1256 12/01/21 0755  GLUCAP 81 85 72 79 88    Discharge time spent: {LESS THAN/GREATER THAN:26388} 30 minutes.  Signed: Jennye Boroughs, MD [facility] 12/01/2021

## 2021-12-01 NOTE — Plan of Care (Signed)
  Problem: Activity: Goal: Risk for activity intolerance will decrease Outcome: Progressing   Problem: Nutrition: Goal: Adequate nutrition will be maintained Outcome: Progressing   Problem: Elimination: Goal: Will not experience complications related to urinary retention Outcome: Progressing   Problem: Pain Managment: Goal: General experience of comfort will improve Outcome: Progressing   Problem: Safety: Goal: Ability to remain free from injury will improve Outcome: Progressing   

## 2021-12-02 ENCOUNTER — Telehealth: Payer: Self-pay | Admitting: *Deleted

## 2021-12-02 LAB — CULTURE, BLOOD (ROUTINE X 2): Culture: NO GROWTH

## 2021-12-02 NOTE — Telephone Encounter (Signed)
Transition Care Management Follow-up Telephone Call Date of discharge and from where: [facility] 8-30 How have you been since you were released from the hospital? Feeling ok tired Any questions or concerns? No  Items Reviewed: Did the pt receive and understand the discharge instructions provided? Yes  Medications obtained and verified? Yes  Other? No  Any new allergies since your discharge? Yes  Dietary orders reviewed? No Do you have support at home? Yes   Home Care and Equipment/Supplies: Were home health services ordered?  If so, what is the name of the agency?   Has the agency set up a time to come to the patient's home?  Were any new equipment or medical supplies ordered?   What is the name of the medical supply agency?  Were you able to get the supplies/equipment?  Do you have any questions related to the use of the equipment or supplies?   Functional Questionnaire: (I = Independent and D = Dependent) ADLs:   I  Bathing/Dressing- I  Meal Prep- I  Eating- I  Maintaining continence- I  Transferring/Ambulation- I  Managing Meds- I  Follow up appointments reviewed:  PCP Hospital f/u appt confirmed [facility] f/u appt confirmed? Yes  Scheduled to see Urology 9-14 Are transportation arrangements needed? No  If their condition worsens, is the pt aware to call PCP or go to the Emergency Dept.? Yes Was the patient provided with contact information for the PCP's office or ED? Yes Was to pt encouraged to call back with questions or concerns? Yes

## 2021-12-03 ENCOUNTER — Emergency Department
Admission: EM | Admit: 2021-12-03 | Discharge: 2021-12-03 | Disposition: A | Discharge status: Home / Self Care | Payer: BC Managed Care – PPO | Attending: Emergency Medicine | Admitting: Emergency Medicine

## 2021-12-03 ENCOUNTER — Encounter: Payer: Self-pay | Admitting: Emergency Medicine

## 2021-12-03 ENCOUNTER — Other Ambulatory Visit: Payer: Self-pay

## 2021-12-03 ENCOUNTER — Telehealth: Payer: Self-pay | Admitting: *Deleted

## 2021-12-03 ENCOUNTER — Ambulatory Visit: Payer: Self-pay

## 2021-12-03 DIAGNOSIS — R350 Frequency of micturition: Secondary | ICD-10-CM | POA: Insufficient documentation

## 2021-12-03 DIAGNOSIS — I10 Essential (primary) hypertension: Secondary | ICD-10-CM | POA: Diagnosis not present

## 2021-12-03 DIAGNOSIS — Z20822 Contact with and (suspected) exposure to covid-19: Secondary | ICD-10-CM | POA: Diagnosis not present

## 2021-12-03 DIAGNOSIS — R509 Fever, unspecified: Secondary | ICD-10-CM | POA: Insufficient documentation

## 2021-12-03 LAB — URINALYSIS, ROUTINE W REFLEX MICROSCOPIC
Bacteria, UA: NONE SEEN
Bilirubin Urine: NEGATIVE
Glucose, UA: NEGATIVE mg/dL
Ketones, ur: NEGATIVE mg/dL
Leukocytes,Ua: NEGATIVE
Nitrite: NEGATIVE
Protein, ur: NEGATIVE mg/dL
Specific Gravity, Urine: 1.016 (ref 1.005–1.030)
pH: 7 (ref 5.0–8.0)

## 2021-12-03 LAB — BASIC METABOLIC PANEL
Anion gap: 12 (ref 5–15)
BUN: 12 mg/dL (ref 8–23)
CO2: 24 mmol/L (ref 22–32)
Calcium: 9.1 mg/dL (ref 8.9–10.3)
Chloride: 100 mmol/L (ref 98–111)
Creatinine, Ser: 0.95 mg/dL (ref 0.44–1.00)
GFR, Estimated: >60 mL/min (ref >60)
Glucose, Bld: 90 mg/dL (ref 70–99)
Potassium: 3.9 mmol/L (ref 3.5–5.1)
Sodium: 136 mmol/L (ref 135–145)

## 2021-12-03 LAB — CBC
HCT: 37.2 % (ref 36.0–46.0)
Hemoglobin: 12.1 g/dL (ref 12.0–15.0)
MCH: 29.1 pg (ref 26.0–34.0)
MCHC: 32.5 g/dL (ref 30.0–36.0)
MCV: 89.4 fL (ref 80.0–100.0)
Platelets: 290 10*3/uL (ref 150–400)
RBC: 4.16 MIL/uL (ref 3.87–5.11)
RDW: 13.3 % (ref 11.5–15.5)
WBC: 13.1 10*3/uL — ABNORMAL HIGH (ref 4.0–10.5)
nRBC: 0 % (ref 0.0–0.2)

## 2021-12-03 MED ORDER — CEPHALEXIN 500 MG PO CAPS
500.0000 mg | ORAL_CAPSULE | Freq: Three times a day (TID) | ORAL | 0 refills | Status: AC
Start: 2021-12-03 — End: 2021-12-08

## 2021-12-03 MED ORDER — CEPHALEXIN 500 MG PO CAPS
500.0000 mg | ORAL_CAPSULE | Freq: Once | ORAL | Status: AC
  Administered 2021-12-03: 500 mg via ORAL
  Filled 2021-12-03: qty 1

## 2021-12-03 NOTE — ED Triage Notes (Signed)
Patient to ED for urinary frequency. Patient states she is going often but feels like she is not emptying bladder. Also, c/o fever at home- 99.7 and slight nausea. Two episodes of loose stool. Discharged from hospital Wed afternoon for uro-sepsis.

## 2021-12-03 NOTE — Telephone Encounter (Signed)
  Chief Complaint: Low grade fever, nausea, urinary frequency,and urgency Symptoms: Pt is again having urinary urgency and frequency. Frequency: today Pertinent Negatives: Patient denies  Disposition: '[x]'$ ED /'[]'$ Urgent Care (no appt availability in office) / '[]'$ Appointment(In office/virtual)/ '[]'$  [facility]/ '[]'$ Home Care/ '[]'$ Refused Recommended Disposition /'[]'$ [facility]/ '[]'$  Follow-up with PCP Additional Notes: PT was recently discharged from hospital for sepsis that started as UTI. Pt has a low fever, nausea and is experiencing UTI S/S.    Summary: chills, nausea   Patient was discharged from the hospital due to sepsis, patient temperature at 1:27 pm is 99.7, patient feeling has, chills and feeling nausea.       Reason for Disposition  Patient sounds very sick or weak to the triager  (Exception: Mild weakness and hasn't taken fever medicine.)  Answer Assessment - Initial Assessment Questions 1. TEMPERATURE: "What is the most recent temperature?"  "How was it measured?"      99.7 At 1:30 2. ONSET: "When did the fever start?"      Unsure 3. CHILLS: "Do you have chills?" If yes: "How bad are they?"  (e.g., none, mild, moderate, severe)   - NONE: no chills   - MILD: feeling cold   - MODERATE: feeling very cold, some shivering (feels better under a thick blanket)   - SEVERE: feeling extremely cold with shaking chills (general body shaking, rigors; even under a thick blanket)      mild 4. OTHER SYMPTOMS: "Do you have any other symptoms besides the fever?"  (e.g., abdomen pain, cough, diarrhea, earache, headache, sore throat, urination pain)     Nausea 5. CAUSE: If there are no symptoms, ask: "What do you think is causing the fever?"      Unsure 6. CONTACTS: "Does anyone else in the family have an infection?"     no 7. TREATMENT: "What have you done so far to treat this fever?" (e.g., medications)     Released from hospital for septsis 8. IMMUNOCOMPROMISE: "Do you  have of the following: diabetes, HIV positive, splenectomy, cancer chemotherapy, chronic steroid treatment, transplant patient, etc."     no 9. PREGNANCY: "Is there any chance you are pregnant?" "When was your last menstrual period?"     na 10. TRAVEL: "Have you traveled out of the country in the last month?" (e.g., travel history, exposures)       no  Protocols used: Fever-A-AH

## 2021-12-03 NOTE — Telephone Encounter (Signed)
Daughter calling stating that pt is having a fever pf 99.7 and difficulty urinating, daughter will take pt to ER. I advised due to the time, we don't have any providers in office. Pt was discharged from [facility] 11/28/2021

## 2021-12-03 NOTE — ED Provider Triage Note (Signed)
  Emergency Medicine Provider Triage Evaluation Note  Tracie Sheppard , a 66 y.o.female,  was evaluated in triage.  Pt complains of urinary frequency and fever/nausea.  Patient states that she was recently discharged from hospital Wednesday afternoon for urosepsis.  She finished her last antibiotic today.  However, she states that she feels like her symptoms are coming back.   Review of Systems  Positive: Urinary frequency, fever/nausea Negative: Denies fever, chest pain, vomiting  Physical Exam   Vitals:   12/03/21 1703  BP: 130/80  Pulse: 83  Resp: 16  Temp: 98.6 F (37 C)  SpO2: 100%   Gen:   Awake, no distress   Resp:  Normal effort  MSK:   Moves extremities without difficulty  Other:    Medical Decision Making  Given the patient's initial medical screening exam, the following diagnostic evaluation has been ordered. The patient will be placed in the appropriate treatment space, once one is available, to complete the evaluation and treatment. I have discussed the plan of care with the patient and I have advised the patient that an ED physician or mid-level practitioner will reevaluate their condition after the test results have been received, as the results may give them additional insight into the type of treatment they may need.    Diagnostics: Labs, UA  Treatments: none immediately   Teodoro Spray, [place] 12/03/21 1752

## 2021-12-03 NOTE — ED Provider Notes (Signed)
[facility] Provider Note    Event Date/Time   First MD Initiated Contact with Patient 12/03/21 2145     (approximate)  History   Chief Complaint: Urinary Frequency  HPI  Tracie Sheppard is a 66 y.o. female with a past medical history of anxiety, depression, hypertension, presents emergency department for low-grade fever and urinary pressure.  According to the patient and record review patient was just admitted to the hospital discharged 2 days ago 12/01/2021, per record review patient was admitted for urinary tract infection, sepsis had positive urine as well as a positive E. coli blood culture.  Patient was admitted for several days on IV antibiotics and ultimately discharged home 2 days ago with 2 days of Levaquin she took 1 dose yesterday and 1 this morning.  Patient states today she began having urinary pressure once again.  She states the urinary pressure she was experiencing in the hospital never really went away but did not improve.  She states she felt somewhat weak and took her temperature and it was 100.9 at home.  She called her doctor who recommended she come to the emergency department.  Patient denies any abdominal pain denies any nausea vomiting or diarrhea, cough or congestion.  Physical Exam   Triage Vital Signs: ED Triage Vitals  Enc Vitals Group     BP 12/03/21 1703 130/80     Pulse Rate 12/03/21 1703 83     Resp 12/03/21 1703 16     Temp 12/03/21 1703 98.6 F (37 C)     Temp Source 12/03/21 1703 Oral     SpO2 12/03/21 1703 100 %     Weight 12/03/21 1704 208 lb (94.3 kg)     Height 12/03/21 1704 '5\' 4"'$  (1.626 m)     Head Circumference --      Peak Flow --      Pain Score 12/03/21 1703 0     Pain Loc --      Pain Edu? --      Excl. in [place]? --     Most recent vital signs: Vitals:   12/03/21 1703 12/03/21 2102  BP: 130/80 (!) 159/78  Pulse: 83 72  Resp: 16 16  Temp: 98.6 F (37 C) 98.1 F (36.7 C)  SpO2: 100% 100%     General: Awake, no distress.  CV:  Good peripheral perfusion.  Regular rate and rhythm  Resp:  Normal effort.  Equal breath sounds bilaterally.  Abd:  No distention.  Soft, nontender.  No rebound or guarding.  Benign abdomen.   ED Results / Procedures / Treatments     MEDICATIONS ORDERED IN ED: Medications  cephALEXin (KEFLEX) capsule 500 mg (500 mg Oral Given 12/03/21 2235)     IMPRESSION / MDM / ASSESSMENT AND PLAN / ED COURSE  I reviewed the triage vital signs and the nursing notes.  Patient's presentation is most consistent with acute presentation with potential threat to life or bodily function.  Patient presents to the emergency department for a reported fever at home 100.9 patient is 98.1, afebrile in the emergency department.  I reviewed the patient's discharge summary from 2 days ago.  Patient had a positive E. coli blood culture as well as a urinalysis consistent with urinary tract infection.  Today the patient's lab work shows a leukocytosis of 13,000 largely unchanged from discharge, reassuring chemistry, urinalysis appears normal no sign of infection.  However given the patient's positive blood cultures her last admission and  positive fever at home we will recheck blood cultures we will send a urine culture.  We will extend the patient's home antibiotics for the next 5 days Keflex 3 times daily.  Given the patient's recent hospital admission and low-grade fever today we will also check a COVID/flu swab as a precaution.  Patient agreeable to plan.  Patient will be discharged home, patient to follow-up with her doctor regarding blood and urine cultures as well as COVID swab on MyChart.  FINAL CLINICAL IMPRESSION(S) / ED DIAGNOSES   Fever    Note:  This document was prepared using Dragon voice recognition software and may include unintentional dictation errors.   Harvest Dark, MD 12/03/21 2312

## 2021-12-03 NOTE — Telephone Encounter (Signed)
Agree with ER.

## 2021-12-04 LAB — RESP PANEL BY RT-PCR (FLU A&B, COVID) ARPGX2
Influenza A by PCR: NEGATIVE
Influenza B by PCR: NEGATIVE
SARS Coronavirus 2 by RT PCR: NEGATIVE

## 2021-12-05 DIAGNOSIS — F419 Anxiety disorder, unspecified: Secondary | ICD-10-CM | POA: Insufficient documentation

## 2021-12-05 LAB — URINE CULTURE: Culture: NO GROWTH

## 2021-12-07 ENCOUNTER — Ambulatory Visit: Payer: BC Managed Care – PPO | Admitting: Nurse Practitioner

## 2021-12-07 DIAGNOSIS — N3001 Acute cystitis with hematuria: Secondary | ICD-10-CM

## 2021-12-08 LAB — CULTURE, BLOOD (ROUTINE X 2)
Culture: NO GROWTH
Culture: NO GROWTH
Special Requests: ADEQUATE

## 2021-12-09 ENCOUNTER — Ambulatory Visit: Payer: BC Managed Care – PPO | Admitting: Family Medicine

## 2021-12-09 ENCOUNTER — Ambulatory Visit
Admission: RE | Admit: 2021-12-09 | Discharge: 2021-12-09 | Disposition: A | Discharge status: Home / Self Care | Payer: BC Managed Care – PPO | Source: Ambulatory Visit | Attending: Physician Assistant | Admitting: Physician Assistant

## 2021-12-09 ENCOUNTER — Ambulatory Visit: Payer: BC Managed Care – PPO | Admitting: Physician Assistant

## 2021-12-09 ENCOUNTER — Encounter: Payer: Self-pay | Admitting: Physician Assistant

## 2021-12-09 VITALS — BP 145/77 | HR 92 | Ht 64.0 in | Wt 197.0 lb

## 2021-12-09 DIAGNOSIS — Z87440 Personal history of urinary (tract) infections: Secondary | ICD-10-CM

## 2021-12-09 DIAGNOSIS — R109 Unspecified abdominal pain: Secondary | ICD-10-CM | POA: Diagnosis not present

## 2021-12-09 DIAGNOSIS — N281 Cyst of kidney, acquired: Secondary | ICD-10-CM

## 2021-12-09 LAB — MICROSCOPIC EXAMINATION

## 2021-12-09 LAB — URINALYSIS, COMPLETE
Bilirubin, UA: NEGATIVE
Glucose, UA: NEGATIVE
Ketones, UA: NEGATIVE
Leukocytes,UA: NEGATIVE
Nitrite, UA: NEGATIVE
Specific Gravity, UA: 1.02 (ref 1.005–1.030)
Urobilinogen, Ur: 0.2 mg/dL (ref 0.2–1.0)
pH, UA: 5.5 (ref 5.0–7.5)

## 2021-12-09 MED ORDER — SULFAMETHOXAZOLE-TRIMETHOPRIM 800-160 MG PO TABS: 1.0000 | ORAL_TABLET | Freq: Two times a day (BID) | ORAL | 0 refills | Status: AC

## 2021-12-09 MED ORDER — CEFTRIAXONE SODIUM 1 G IJ SOLR
1.0000 g | Freq: Once | INTRAMUSCULAR | Status: AC
  Administered 2021-12-09: 1 g via INTRAMUSCULAR

## 2021-12-09 MED ORDER — ONDANSETRON 4 MG PO TBDP: 4.0000 mg | ORAL_TABLET | Freq: Three times a day (TID) | ORAL | 0 refills | Status: DC | PRN

## 2021-12-09 NOTE — Patient Instructions (Signed)
Start bactrim tomorrow evening, take zofran as needed.  If you get worse go back to emergency room.  If you have a fever after 3 days go to the emergency room.

## 2021-12-09 NOTE — Progress Notes (Unsigned)
12/09/2021 2:41 PM   KEYDI GIEL 1955/10/29 [id number]  CC: Chief Complaint  Patient presents with   Hospitalization Follow-up   HPI: Tracie Sheppard is a 66 y.o. female with PMH microscopic hematuria and nephrolithiasis who underwent cystoscopy with Dr. Bernardo Heater on 09/03/2021 with no significant findings who presents today for hospital follow-up.   She was seen in the ED on 11/27/2021 with reports of 1 week of dysuria with bilateral flank pain, fevers, and nausea.  CT stone study on admission showed multiple bilateral nonobstructing renal stones without ureteral stones.  She was admitted with sepsis of likely urinary source.  Blood cultures grew pansensitive E. coli and admission urine culture was negative.  She was treated with IV Rocephin (5 days per chart review) and transitioned to p.o. Levaquin x 2 days at discharge.  Repeat CTAP without contrast on 11/29/2021 showed new possible hemorrhage within a right renal cortical cyst, stable bilateral nonobstructing nephrolithiasis without ureteral stones, and new mild right perinephric stranding.  She was discharged on 12/01/2021.  2 days after discharge, she returned to the ED with reports of urinary frequency, the sensation of incomplete bladder emptying, elevated body temp, Tmax 100.9 F, and nausea as well as loose stools.  UA was significantly improved over prior, leukocytosis was stable at 13.1, renal function was stable at baseline 0.95, and repeat urine and blood cultures have since finalized negative.  She was prescribed Keflex 500 mg 3 times daily x5 days.  She is accompanied today by her daughter, Ander Purpura, who contributes to HPI. Today she reports continued malaise, anorexia, brain fog, right flank discomfort, lower abdominal pressure with voiding, and nighttime fevers, Tmax 102F. Her daughter states she looked good upon initial discharge after receiving IV antibiotics, but her status has deteriorated a bit on PO antibiotics.  Notably, the  patient reports she has not been taking prescribed Keflex every 8 hours due to not feeling well. She thinks she may have accidentally skipped some doses.  She also mentions possible recurrent UTIs with episodes of dysuria, for which she has not been seeking care.  In-office UA today positive for 1+ protein and 2+ blood; urine microscopy with 11-30 RBCs/HPF and moderate bacteria.   She was sent from clinic today for a STAT renal ultrasound, which showed a hypoechoic mass in the midpole of the right kidney compatible with possible hemorrhagic cyst.  Follow-up imaging in 6 months was recommended, though this was previously evaluated on a CT urogram 5 months ago and found to be consistent with hemorrhagic cyst.  This was felt unlikely to represent renal abscess.  No hydronephrosis.  PMH: Past Medical History:  Diagnosis Date   Allergy    Seasonal   Anxiety    Bilateral carpal tunnel syndrome 05/30/2017   Chronic back pain    Depression, major, recurrent, moderate (HCC)    Diabetes mellitus without complication (HCC)    Prediabetic   High serum testosterone    HTN (hypertension) 09/12/2019   Kidney stone    Sleep apnea    Thyroid disease    did not show up in lab work but per other symptoms previous doctor started her on this    Surgical History: Past Surgical History:  Procedure Laterality Date   CHOLECYSTECTOMY     1997   COLONOSCOPY WITH PROPOFOL N/A 11/16/2018   Procedure: COLONOSCOPY WITH PROPOFOL;  Surgeon: Lin Landsman, MD;  Location: [facility] ENDOSCOPY;  Service: Gastroenterology;  Laterality: N/A;   CYSTOSCOPY  09/03/2021  ESOPHAGOGASTRODUODENOSCOPY (EGD) WITH PROPOFOL N/A 11/16/2018   Procedure: ESOPHAGOGASTRODUODENOSCOPY (EGD) WITH PROPOFOL;  Surgeon: Lin Landsman, MD;  Location: [place];  Service: Gastroenterology;  Laterality: N/A;   NASAL SEPTUM SURGERY     OOPHORECTOMY     TUBAL LIGATION     1989    Home Medications:  Allergies as of 12/09/2021        Reactions   Aleve [naproxen Sodium] Other (See Comments)   Chest pain   Statins Other (See Comments)   myalgias        Medication List        Accurate as of December 09, 2021  2:41 PM. If you have any questions, ask your nurse or doctor.          acetaminophen 500 MG tablet Commonly known as: TYLENOL Tylenol Extra Strength 500 mg tablet   Alpha-Lipoic Acid 600 MG Caps Take 600 mg by mouth daily.   buPROPion 300 MG 24 hr tablet Commonly known as: WELLBUTRIN XL Take 1 tablet (300 mg total) by mouth daily.   cetirizine 10 MG tablet Commonly known as: ZYRTEC Take 1 tablet (10 mg total) by mouth daily as needed for allergies.   citalopram 40 MG tablet Commonly known as: CELEXA Take 1.5 tablets (60 mg total) by mouth daily.   clonazePAM 0.5 MG tablet Commonly known as: KLONOPIN TAKE 1 TABLET BY MOUTH DAILY AS NEEDED FOR ANIXETY What changed:  how much to take how to take this when to take this reasons to take this   Cyanocobalamin 1000 MCG Tbcr Take 1,000 mcg by mouth daily.   cyclobenzaprine 10 MG tablet Commonly known as: FLEXERIL Take 0.5-1 tablets (5-10 mg total) by mouth at bedtime.   diclofenac 75 MG EC tablet Commonly known as: VOLTAREN Take 1 tablet (75 mg total) by mouth 2 (two) times daily.   fluticasone 50 MCG/ACT nasal spray Commonly known as: FLONASE SPRAY 1 SPRAY INTO EACH NOSTRIL EVERY DAY   ibuprofen 200 MG tablet Commonly known as: ADVIL Take 200 mg by mouth as needed.   propranolol ER 60 MG 24 hr capsule Commonly known as: INDERAL LA Take 1 tablet by mouth daily.   spironolactone 100 MG tablet Commonly known as: ALDACTONE Take 1 tablet (100 mg total) by mouth 2 (two) times daily.   VITAMIN D PO Take by mouth daily. 1000 mcg        Allergies:  Allergies  Allergen Reactions   Aleve [Naproxen Sodium] Other (See Comments)    Chest pain   Statins Other (See Comments)    myalgias    Family History: Family History   Problem Relation Age of Onset   Diabetes Mother    Anemia Mother    Hyperlipidemia Mother    Arthritis Mother    Hypertension Father    Cancer Father    Celiac disease Sister    Diabetes Maternal Grandmother    Stroke Maternal Grandmother    Heart attack Maternal Grandmother    Heart disease Paternal Grandfather     Social History:   reports that she has never smoked. She has never used smokeless tobacco. She reports current alcohol use. She reports that she does not use drugs.  Physical Exam: BP (!) 145/77   Pulse 92   Ht '5\' 4"'$  (1.626 m)   Wt 197 lb (89.4 kg)   LMP  (LMP Unknown)   SpO2 98%   BMI 33.81 kg/m   Constitutional:  Alert and oriented, no acute distress,  fatigued appearing HEENT: , AT Cardiovascular: No clubbing, cyanosis, or edema Respiratory: Normal respiratory effort, no increased work of breathing GU: No CVA tenderness Skin: No rashes, bruises or suspicious lesions Neurologic: Grossly intact, no focal deficits, moving all 4 extremities Psychiatric: Normal mood and affect  Laboratory Data: Results for orders placed or performed in visit on 12/09/21  CULTURE, URINE COMPREHENSIVE   Specimen: Urine   UR  Result Value Ref Range   Urine Culture, Comprehensive Preliminary report    Organism ID, Bacteria Comment   Microscopic Examination   Urine  Result Value Ref Range   WBC, UA 0-5 0 - 5 /hpf   RBC, Urine 11-30 (A) 0 - 2 /hpf   Epithelial Cells (non renal) 0-10 0 - 10 /hpf   Mucus, UA Present (A) Not Estab.   Bacteria, UA Moderate (A) None seen/Few  Urinalysis, Complete  Result Value Ref Range   Specific Gravity, UA 1.020 1.005 - 1.030   pH, UA 5.5 5.0 - 7.5   Color, UA Yellow Yellow   Appearance Ur Clear Clear   Leukocytes,UA Negative Negative   Protein,UA 1+ (A) Negative/Trace   Glucose, UA Negative Negative   Ketones, UA Negative Negative   RBC, UA 2+ (A) Negative   Bilirubin, UA Negative Negative   Urobilinogen, Ur 0.2 0.2 - 1.0 mg/dL    Nitrite, UA Negative Negative   Microscopic Examination See below:    Pertinent Imaging: Renal US, 12/09/2021: CLINICAL DATA:  Persistent RIGHT flank pain and fevers.   EXAM: RENAL / URINARY TRACT ULTRASOUND COMPLETE   COMPARISON:  CT of the abdomen and pelvis on 11/29/2021 and 11/27/2021   FINDINGS: Right Kidney:   Renal measurements: 12.2 by 5.6 x 4.5 centimeters = volume: 163 mL. Normal renal echogenicity. No hydronephrosis. Scattered small cysts are present, within the midpole region, there is a hypoechoic and anechoic mass measures 3.3 x 3.4 x 4.2 centimeters. Lesion demonstrates no internal blood flow and correlates well with the previously seen probable hemorrhagic cyst in the midpole region on CT 11/29/2021.   Left Kidney:   Renal measurements: 11.7 x 5.1 x 5.4 centimeters = volume: 169 mL. Echogenicity within normal limits. No mass or hydronephrosis visualized.   Bladder:   Appears normal for degree of bladder distention.   Other:   None.   IMPRESSION: 1. Mildly heterogeneous circumscribed hypoechoic mass in the midpole region of the RIGHT kidney although the findings are compatible with hemorrhagic cyst, follow-up is indicated. Recommend follow-up renal ultrasound or multiphasic renal protocol CT in 6 months. The appearance of this lesion would be atypical for abscess. 2. No hydronephrosis.     Electronically Signed   By: Nolon Nations M.D.   On: 12/09/2021 16:46  I personally reviewed the images referenced above and note stable right renal cyst without evidence of abscess.  Assessment & Plan:   66 year old female with a history of microscopic hematuria and nephrolithiasis recently admitted with urosepsis with persistent malaise, fevers, right flank discomfort, and bladder discomfort consistent with persistent UTI.  Stat renal ultrasound today with no evidence of urinary obstruction or abscess. 1. Right flank pain We will treat for persistent UTI  today.  We administered 1 dose of IM Rocephin in clinic and will have her follow-up with 7 days of p.o. Bactrim.  I am also prescribing Zofran given patient reports of nausea.  We will send her urine for culture today, though I suspect this will come back negative.  We discussed that she may  continue to have fevers for up to 72 hours after resuming tissue penetrating antibiotics.  I asked her to proceed to the emergency department if her fevers worsen and to call me back in clinic if she does not start to feel better within 72 hours.  If her symptoms persist, recommend having her follow-up with infectious diseases for further evaluation.  No need to repeat UA to prove resolution of hematuria given her known history of MH. - Urinalysis, Complete - CULTURE, URINE COMPREHENSIVE - US RENAL; Future - cefTRIAXone (ROCEPHIN) injection 1 g - sulfamethoxazole-trimethoprim (BACTRIM DS) 800-160 MG tablet; Take 1 tablet by mouth 2 (two) times daily for 7 days.  Dispense: 14 tablet; Refill: 0 - ondansetron (ZOFRAN-ODT) 4 MG disintegrating tablet; Take 1 tablet (4 mg total) by mouth every 8 (eight) hours as needed for nausea or vomiting.  Dispense: 20 tablet; Refill: 0  2. History of recurrent UTI (urinary tract infection) Possible history of recurrent UTIs, for which she has not been seeking care.  We will have her start seeking care in my office when she has symptoms so we can start developing culture data.  3. Renal cyst, right Benign on recent CT urogram.  No further imaging needed.  Return in about 2 weeks (around 12/23/2021) for rUTI follow-up.  I spent 50 minutes on the day of the encounter to include pre-visit record review, face-to-face time with the patient, and post-visit ordering of tests.   Debroah Loop, PA-C  [facility] [address] [place], [place] [address] [phone]

## 2021-12-10 ENCOUNTER — Encounter: Payer: Self-pay | Admitting: Family Medicine

## 2021-12-10 ENCOUNTER — Ambulatory Visit: Payer: BC Managed Care – PPO | Admitting: Family Medicine

## 2021-12-10 VITALS — BP 109/80 | HR 87 | Temp 98.0°F | Wt 198.0 lb

## 2021-12-10 DIAGNOSIS — N179 Acute kidney failure, unspecified: Secondary | ICD-10-CM

## 2021-12-10 DIAGNOSIS — N200 Calculus of kidney: Secondary | ICD-10-CM

## 2021-12-10 DIAGNOSIS — A4151 Sepsis due to Escherichia coli [E. coli]: Secondary | ICD-10-CM | POA: Diagnosis not present

## 2021-12-10 DIAGNOSIS — R6520 Severe sepsis without septic shock: Secondary | ICD-10-CM

## 2021-12-10 DIAGNOSIS — D72829 Elevated white blood cell count, unspecified: Secondary | ICD-10-CM | POA: Diagnosis not present

## 2021-12-10 MED ORDER — CYCLOBENZAPRINE HCL 10 MG PO TABS: 5.0000 mg | ORAL_TABLET | Freq: Every day | ORAL | 0 refills | Status: DC

## 2021-12-10 NOTE — Progress Notes (Signed)
BP 109/80   Pulse 87   Temp 98 F (36.7 C)   Wt 198 lb (89.8 kg)   LMP  (LMP Unknown)   SpO2 97%   BMI 33.99 kg/m    Subjective:    Patient ID: Tracie Sheppard, female    DOB: June 15, 1955, 66 y.o.   MRN: [id number]  HPI: Tracie Sheppard is a 66 y.o. female  Chief Complaint  Patient presents with   Hospitalization Follow-up    Patient states she was admitted into the hospital for sepsis and UTI. Patient was seen by urologist yesterday.    Shoulder Pain    Patient states she is in a lot of pain in her shoulder and neck, has tightness in her lower back. Patient believes laying in a hospital bed for 5 days made her hurt worse.    Transition of Care Hospital Follow up.   Hospital/Facility: [facility] D/C Physician: Dr. Mal Misty D/C Date: 12/01/21  Records Requested: 12/10/21 Records Received: 12/10/21 Records Reviewed: 12/10/21  Diagnoses on Discharge:   Severe sepsis [facility]) Active Problems:   Shortness of breath   HTN (hypertension)   AKI (acute kidney injury) ([place])   Diarrhea   Depression, major, recurrent, moderate ([place])   Tremor   Nephrolithiasis  Date of interactive Contact within 48 hours of discharge: 12/02/21 Contact was through: phone  Date of 7 day or 14 day face-to-face visit:  12/10/21  within 14 days  Outpatient Encounter Medications as of 12/10/2021  Medication Sig   acetaminophen (TYLENOL) 500 MG tablet Tylenol Extra Strength 500 mg tablet   Alpha-Lipoic Acid 600 MG CAPS Take 600 mg by mouth daily.   buPROPion (WELLBUTRIN XL) 300 MG 24 hr tablet Take 1 tablet (300 mg total) by mouth daily.   cetirizine (ZYRTEC) 10 MG tablet Take 1 tablet (10 mg total) by mouth daily as needed for allergies.   citalopram (CELEXA) 40 MG tablet Take 1.5 tablets (60 mg total) by mouth daily.   clonazePAM (KLONOPIN) 0.5 MG tablet TAKE 1 TABLET BY MOUTH DAILY AS NEEDED FOR ANIXETY (Patient taking differently: Take 0.5 mg by mouth daily as needed for anxiety. TAKE 1 TABLET BY MOUTH DAILY AS NEEDED  FOR ANIXETY)   Cyanocobalamin 1000 MCG TBCR Take 1,000 mcg by mouth daily.   cyclobenzaprine (FLEXERIL) 10 MG tablet Take 0.5-1 tablets (5-10 mg total) by mouth at bedtime.   diclofenac (VOLTAREN) 75 MG EC tablet Take 1 tablet (75 mg total) by mouth 2 (two) times daily.   fluticasone (FLONASE) 50 MCG/ACT nasal spray SPRAY 1 SPRAY INTO EACH NOSTRIL EVERY DAY   ibuprofen (ADVIL) 200 MG tablet Take 200 mg by mouth as needed.    ondansetron (ZOFRAN-ODT) 4 MG disintegrating tablet Take 1 tablet (4 mg total) by mouth every 8 (eight) hours as needed for nausea or vomiting.   propranolol ER (INDERAL LA) 60 MG 24 hr capsule Take 1 tablet by mouth daily.   spironolactone (ALDACTONE) 100 MG tablet Take 1 tablet (100 mg total) by mouth 2 (two) times daily.   sulfamethoxazole-trimethoprim (BACTRIM DS) 800-160 MG tablet Take 1 tablet by mouth 2 (two) times daily for 7 days.   VITAMIN D PO Take by mouth daily. 1000 mcg   [DISCONTINUED] cyclobenzaprine (FLEXERIL) 10 MG tablet Take 0.5-1 tablets (5-10 mg total) by mouth at bedtime.   No facility-administered encounter medications on file as of 12/10/2021.  Per Hospitalist: "Hospital Course: Tracie Sheppard is a 66 y.o. female with medical history significant for  hypertension, essential tremor, anxiety, depression, nephrolithiasis, who presented to the hospital with lower abdominal pain, lower back pain, bilateral flank pain, dysuria, fever and chills of about 2 days duration.  She also reported nausea and vomiting a day prior to admission.   She was admitted to the hospital for severe sepsis complicated by acute kidney injury.  Blood culture was positive for E. coli consistent with E. coli bacteremia.  She was treated with IV fluids and IV Rocephin.  She developed shortness of breath and there was concern for pulmonary edema and fluid overload so IV fluids were discontinued and she was treated with IV Lasix.  She developed diarrhea during hospitalization but this  improved prior to discharge.  She was transition to oral Levaquin at discharge.  Follow-up with neurologist was recommended for management of nephrolithiasis and renal cyst.  Discharge plan was discussed with the patient and her family at the bedside."  Diagnostic Tests Reviewed:  CLINICAL DATA:  66 year old female with history of urinary tract infection and sepsis. Suspected pyelonephritis. Dark foul-smelling urine for 1 week.   EXAM: CT ABDOMEN AND PELVIS WITHOUT CONTRAST   TECHNIQUE: Multidetector CT imaging of the abdomen and pelvis was performed following the standard protocol without IV contrast.   RADIATION DOSE REDUCTION: This exam was performed according to the departmental dose-optimization program which includes automated exposure control, adjustment of the mA and/or kV according to patient size and/or use of iterative reconstruction technique.   COMPARISON:  CT of the abdomen and pelvis 07/21/2021.   FINDINGS: Lower chest: Unremarkable.   Hepatobiliary: No definite suspicious cystic or solid hepatic lesions are confidently identified on today's noncontrast CT examination. Status post cholecystectomy.   Pancreas: No definite pancreatic mass or peripancreatic fluid collections or inflammatory changes are noted on today's noncontrast CT examination.   Spleen: Unremarkable.   Adrenals/Urinary Tract: Multiple nonobstructive calculi are noted within the collecting systems of both kidneys measuring up to 5 mm in the lower pole collecting system of the right kidney. No additional calculi are identified along the course of either ureter, or within the lumen of the urinary bladder. No hydroureteronephrosis. In the interpolar region of the right kidney there is a 2.7 cm low-attenuation lesion, incompletely characterized on today's noncontrast CT examination, but similar to the prior study and statistically likely a cyst (no imaging follow-up is recommended). Left kidney is  normal in appearance. Urinary bladder is unremarkable in appearance. Bilateral adrenal glands are normal in appearance.   Stomach/Bowel: Unenhanced appearance of the stomach is normal. There is no pathologic dilatation of small bowel or colon. Normal appendix.   Vascular/Lymphatic: Aortic atherosclerosis (mild). No lymphadenopathy noted in the abdomen or pelvis.   Reproductive: Uterus and ovaries are atrophic.   Other: No significant volume of ascites.  No pneumoperitoneum.   Musculoskeletal: There are no aggressive appearing lytic or blastic lesions noted in the visualized portions of the skeleton.   IMPRESSION: 1. Multiple nonobstructive calculi are noted within the collecting systems of both kidneys measuring up to 5 mm in the lower pole collecting system of the right kidney. No ureteral stones or findings of urinary tract obstruction are noted at this time. 2. Mild aortic atherosclerosis. 3. Additional incidental findings, as above.  CLINICAL DATA:  Questionable sepsis   EXAM: PORTABLE CHEST 1 VIEW   COMPARISON:  01/21/2021   FINDINGS: Low volume chest which accentuates heart size that is otherwise stable appearing. Stable aortic and hilar contours. There is no edema, consolidation, effusion, or pneumothorax. No  acute osseous finding.   IMPRESSION: Low volume chest without focal abnormality.  CLINICAL DATA:  Sepsis   EXAM: CT ABDOMEN AND PELVIS WITHOUT CONTRAST   TECHNIQUE: Multidetector CT imaging of the abdomen and pelvis was performed following the standard protocol without IV contrast.   RADIATION DOSE REDUCTION: This exam was performed according to the departmental dose-optimization program which includes automated exposure control, adjustment of the mA and/or kV according to patient size and/or use of iterative reconstruction technique.   COMPARISON:  11/27/2021, 07/21/2021   FINDINGS: Lower chest: Trace right pleural effusion and associated  atelectasis or consolidation.   Hepatobiliary: No focal liver abnormality is seen. Status post cholecystectomy. No biliary dilatation.   Pancreas: Unremarkable. No pancreatic ductal dilatation or surrounding inflammatory changes.   Spleen: Normal in size without significant abnormality.   Adrenals/Urinary Tract: Adrenal glands are unremarkable. Multiple small bilateral nonobstructive renal calculi. No ureteral calculi or hydronephrosis. New hyperdensity within a previously seen simple appearing cyst of the anterior midportion of the right kidney, measuring 4.1 x 3.2 cm (series 2, image 36). New, mild right-sided perinephric fat stranding. Bladder is unremarkable.   Stomach/Bowel: Stomach is within normal limits. Appendix appears normal. No evidence of bowel wall thickening, distention, or inflammatory changes.   Vascular/Lymphatic: No significant vascular findings are present. No enlarged abdominal or pelvic lymph nodes.   Reproductive: No mass or other significant abnormality.   Other: Small, fat containing umbilical hernia.  No ascites.   Musculoskeletal: No acute or significant osseous findings.   IMPRESSION: 1. New hyperdensity within a previously seen simple appearing cyst of the anterior midportion of the right kidney, measuring 4.1 x 3.2 cm. This is consistent with hemorrhage within a renal cortical cyst, previously characterized as simple and benign (i.e. no underlying solid component or mass) by multiphasic contrast enhanced CT dated 07/21/2021. 2. Multiple small bilateral nonobstructive renal calculi. No ureteral calculi or hydronephrosis. 3. New, mild right-sided perinephric fat stranding, which may reflect infection or inflammation. Correlate with urinalysis. 4. Trace right pleural effusion and associated atelectasis or consolidation.  CLINICAL DATA:  Shortness of breath   EXAM: PORTABLE CHEST 1 VIEW   COMPARISON:  11/27/2021, 01/21/2021    FINDINGS: Increased diffuse bilateral interstitial and mild ground-glass opacity. No pleural effusion. Stable cardiomediastinal silhouette. No pneumothorax   IMPRESSION: Low lung volumes. Interval increase in diffuse bilateral interstitial and mild ground-glass opacity which may be due to diffuse infection/interstitial inflammatory process versus edema.  CLINICAL DATA:  Persistent RIGHT flank pain and fevers.   EXAM: RENAL / URINARY TRACT ULTRASOUND COMPLETE   COMPARISON:  CT of the abdomen and pelvis on 11/29/2021 and 11/27/2021   FINDINGS: Right Kidney:   Renal measurements: 12.2 by 5.6 x 4.5 centimeters = volume: 163 mL. Normal renal echogenicity. No hydronephrosis. Scattered small cysts are present, within the midpole region, there is a hypoechoic and anechoic mass measures 3.3 x 3.4 x 4.2 centimeters. Lesion demonstrates no internal blood flow and correlates well with the previously seen probable hemorrhagic cyst in the midpole region on CT 11/29/2021.   Left Kidney:   Renal measurements: 11.7 x 5.1 x 5.4 centimeters = volume: 169 mL. Echogenicity within normal limits. No mass or hydronephrosis visualized.   Bladder:   Appears normal for degree of bladder distention.   Other:   None.   IMPRESSION: 1. Mildly heterogeneous circumscribed hypoechoic mass in the midpole region of the RIGHT kidney although the findings are compatible with hemorrhagic cyst, follow-up is indicated. Recommend follow-up renal ultrasound  or multiphasic renal protocol CT in 6 months. The appearance of this lesion would be atypical for abscess. 2. No hydronephrosis.  Disposition: Home  Consults: None  Discharge Instructions: Follow up here and with urology  Disease/illness Education: Discussed today  Home Health/Community Services Discussions/Referrals: N/A  Establishment or re-establishment of referral orders for community resources: N/A  Discussion with other health care  providers: N/A  Assessment and Support of treatment regimen adherence: [place] with: Patient  Education for self-management, independent living, and ADLs: Discussed today  Tracie Sheppard notes that she was sent home on levaquin, then went back to the ER with fevers and was sent home again on keflex. She went to urology yesterday who gave her a shot of ceftriaxone and started her on bactrim which she will be starting today. She has been feeling weak and tired and not feeling like herself since getting out of the hospital. She notes that she did have some foul smelling urine, but otherwise doing OK. 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 She did not feel well following that. She notes that she thinks she's pushing it a little hard. Her shoulder have also been feeling tight. No other concerns or complaints at this time.   Relevant past medical, surgical, family and social history reviewed and updated as indicated. Interim medical history since our last visit reviewed. Allergies and medications reviewed and updated.  Review of Systems  Constitutional:  Positive for fatigue. Negative for fever.  Respiratory: Negative.    Cardiovascular: Negative.   Gastrointestinal: Negative.   Genitourinary: Negative.   Musculoskeletal:  Positive for myalgias. Negative for joint swelling, neck pain and neck stiffness.  Skin:  Positive for pallor. Negative for color change, rash and wound.  Psychiatric/Behavioral: Negative.      Per HPI unless specifically indicated above     Objective:    BP 109/80   Pulse 87   Temp 98 F (36.7 C)   Wt 198 lb (89.8 kg)   LMP  (LMP Unknown)   SpO2 97%   BMI 33.99 kg/m   Wt Readings from Last 3 Encounters:  12/10/21 198 lb (89.8 kg)  12/09/21 197 lb (89.4 kg)  12/03/21 208 lb (94.3 kg)    Physical Exam Vitals and nursing note reviewed.  Constitutional:      General: She is not in acute  distress.    Appearance: Normal appearance. She is obese. She is ill-appearing. She is not toxic-appearing or diaphoretic.  HENT:     Head: Normocephalic and atraumatic.     Right Ear: External ear normal.     Left Ear: External ear normal.     Nose: Nose normal.     Mouth/Throat:     Mouth: Mucous membranes are moist.     Pharynx: Oropharynx is clear.  Eyes:     General: No scleral icterus.       Right eye: No discharge.        Left eye: No discharge.     Extraocular Movements: Extraocular movements intact.     Conjunctiva/sclera: Conjunctivae normal.     Pupils: Pupils are equal, round, and reactive to light.  Cardiovascular:     Rate and Rhythm: Normal rate and regular rhythm.     Pulses: Normal pulses.     Heart sounds: Normal heart sounds. No murmur heard.    No friction rub. No gallop.  Pulmonary:  Effort: Pulmonary effort is normal. No respiratory distress.     Breath sounds: Normal breath sounds. No stridor. No wheezing, rhonchi or rales.  Chest:     Chest wall: No tenderness.  Musculoskeletal:        General: Normal range of motion.     Cervical back: Normal range of motion and neck supple.  Skin:    General: Skin is warm and dry.     Capillary Refill: Capillary refill takes less than 2 seconds.     Coloration: Skin is not jaundiced or pale.     Findings: No bruising, erythema, lesion or rash.  Neurological:     General: No focal deficit present.     Mental Status: She is alert and oriented to person, place, and time. Mental status is at baseline.  Psychiatric:        Mood and Affect: Mood normal.        Behavior: Behavior normal.        Thought Content: Thought content normal.        Judgment: Judgment normal.     Results for orders placed or performed in visit on 12/09/21  Microscopic Examination   Urine  Result Value Ref Range   WBC, UA 0-5 0 - 5 /hpf   RBC, Urine 11-30 (A) 0 - 2 /hpf   Epithelial Cells (non renal) 0-10 0 - 10 /hpf   Mucus, UA  Present (A) Not Estab.   Bacteria, UA Moderate (A) None seen/Few  Urinalysis, Complete  Result Value Ref Range   Specific Gravity, UA 1.020 1.005 - 1.030   pH, UA 5.5 5.0 - 7.5   Color, UA Yellow Yellow   Appearance Ur Clear Clear   Leukocytes,UA Negative Negative   Protein,UA 1+ (A) Negative/Trace   Glucose, UA Negative Negative   Ketones, UA Negative Negative   RBC, UA 2+ (A) Negative   Bilirubin, UA Negative Negative   Urobilinogen, Ur 0.2 0.2 - 1.0 mg/dL   Nitrite, UA Negative Negative   Microscopic Examination See below:       Assessment & Plan:   Problem List Items Addressed This Visit       Genitourinary   Nephrolithiasis    No movement on last check. Continue to follow with urology. Call with any concerns.       Other Visit Diagnoses     Sepsis due to Escherichia coli with acute renal failure, unspecified acute renal failure type, unspecified whether septic shock present ([place])    -  Primary   Improved. Slowly recovering. Continue to follow with urology. Finish abx. Urine with culture checked at urology yesterday. Recheck 10 days if not sooner.    Relevant Orders   CBC with Differential/Platelet   Basic metabolic panel   AKI (acute kidney injury) ([place])       Rechecking labs today. Await results.    Leukocytosis, unspecified type       Rechecking labs today. Await results.         Follow up plan: Return in about 10 days (around 12/20/2021).  40 minutes spent with patient today.

## 2021-12-10 NOTE — Assessment & Plan Note (Signed)
No movement on last check. Continue to follow with urology. Call with any concerns.

## 2021-12-11 LAB — CBC WITH DIFFERENTIAL/PLATELET
Basophils Absolute: 0.1 10*3/uL (ref 0.0–0.2)
Basos: 1 %
EOS (ABSOLUTE): 0.1 10*3/uL (ref 0.0–0.4)
Eos: 2 %
Hematocrit: 37.7 % (ref 34.0–46.6)
Hemoglobin: 12.4 g/dL (ref 11.1–15.9)
Immature Grans (Abs): 0 10*3/uL (ref 0.0–0.1)
Immature Granulocytes: 0 %
Lymphocytes Absolute: 2.1 10*3/uL (ref 0.7–3.1)
Lymphs: 35 %
MCH: 29.2 pg (ref 26.6–33.0)
MCHC: 32.9 g/dL (ref 31.5–35.7)
MCV: 89 fL (ref 79–97)
Monocytes Absolute: 0.8 10*3/uL (ref 0.1–0.9)
Monocytes: 13 %
Neutrophils Absolute: 2.9 10*3/uL (ref 1.4–7.0)
Neutrophils: 49 %
Platelets: 455 10*3/uL — ABNORMAL HIGH (ref 150–450)
RBC: 4.24 x10E6/uL (ref 3.77–5.28)
RDW: 12.1 % (ref 11.7–15.4)
WBC: 6 10*3/uL (ref 3.4–10.8)

## 2021-12-11 LAB — BASIC METABOLIC PANEL
BUN/Creatinine Ratio: 14 (ref 12–28)
BUN: 14 mg/dL (ref 8–27)
CO2: 21 mmol/L (ref 20–29)
Calcium: 9.8 mg/dL (ref 8.7–10.3)
Chloride: 99 mmol/L (ref 96–106)
Creatinine, Ser: 1 mg/dL (ref 0.57–1.00)
Glucose: 149 mg/dL — ABNORMAL HIGH (ref 70–99)
Potassium: 4.4 mmol/L (ref 3.5–5.2)
Sodium: 136 mmol/L (ref 134–144)
eGFR: 63 mL/min/{1.73_m2} (ref >59)

## 2021-12-13 LAB — CULTURE, URINE COMPREHENSIVE

## 2021-12-16 ENCOUNTER — Ambulatory Visit: Payer: BC Managed Care – PPO | Admitting: Urology

## 2021-12-20 ENCOUNTER — Encounter: Payer: Self-pay | Admitting: Physician Assistant

## 2021-12-20 ENCOUNTER — Ambulatory Visit: Payer: BC Managed Care – PPO | Admitting: Family Medicine

## 2021-12-20 ENCOUNTER — Ambulatory Visit: Payer: BC Managed Care – PPO | Admitting: Physician Assistant

## 2021-12-20 ENCOUNTER — Encounter: Payer: Self-pay | Admitting: Family Medicine

## 2021-12-20 VITALS — BP 117/76 | HR 94 | Ht 64.0 in | Wt 194.0 lb

## 2021-12-20 VITALS — BP 122/76 | HR 77 | Temp 97.8°F | Wt 194.3 lb

## 2021-12-20 DIAGNOSIS — R6520 Severe sepsis without septic shock: Secondary | ICD-10-CM | POA: Diagnosis not present

## 2021-12-20 DIAGNOSIS — R3129 Other microscopic hematuria: Secondary | ICD-10-CM

## 2021-12-20 DIAGNOSIS — N3946 Mixed incontinence: Secondary | ICD-10-CM

## 2021-12-20 DIAGNOSIS — R8281 Pyuria: Secondary | ICD-10-CM

## 2021-12-20 DIAGNOSIS — R109 Unspecified abdominal pain: Secondary | ICD-10-CM | POA: Diagnosis not present

## 2021-12-20 DIAGNOSIS — A4151 Sepsis due to Escherichia coli [E. coli]: Secondary | ICD-10-CM

## 2021-12-20 DIAGNOSIS — N179 Acute kidney failure, unspecified: Secondary | ICD-10-CM | POA: Diagnosis not present

## 2021-12-20 DIAGNOSIS — Z87440 Personal history of urinary (tract) infections: Secondary | ICD-10-CM

## 2021-12-20 LAB — URINALYSIS, ROUTINE W REFLEX MICROSCOPIC
Bilirubin, UA: NEGATIVE
Glucose, UA: NEGATIVE
Ketones, UA: NEGATIVE
Nitrite, UA: NEGATIVE
Specific Gravity, UA: >1.03 — ABNORMAL HIGH (ref 1.005–1.030)
Urobilinogen, Ur: 0.2 mg/dL (ref 0.2–1.0)
pH, UA: 6 (ref 5.0–7.5)

## 2021-12-20 LAB — MICROSCOPIC EXAMINATION: Bacteria, UA: NONE SEEN

## 2021-12-20 LAB — BLADDER SCAN AMB NON-IMAGING: Scan Result: 0

## 2021-12-20 MED ORDER — ESTRADIOL 0.1 MG/GM VA CREA: TOPICAL_CREAM | VAGINAL | 12 refills | Status: DC

## 2021-12-20 NOTE — Patient Instructions (Signed)
Tracie, Sheppard was very sick. She is getting better, but it's going to take time. She needs to rest. This doesn't mean you can't go to [employer]- but TAKE IT EASY!!!! Or she will get sick again.  -- Dr. Wynetta Emery

## 2021-12-20 NOTE — Patient Instructions (Addendum)
I'm sending out some blood work today and will call you with your results. Call me on Thursday if you feel the same or worse and I will add you on for a lab visit to drop off another urine sample before your upcoming trip. Please see my recurrent UTI instructions below. Please call my clinic to make an appointment the next time you have burning pain with urination or flank pain or are otherwise concerned for a UTI. If your urinary leakage becomes more bothersome, come back and see me and we can start you on medications to help you manage that as well.  Recurrent UTI Prevention Strategies  Start taking an over-the-counter cranberry supplement for urinary tract health. Take this once or twice daily on an empty stomach, e.g. right before bed. Start taking an over-the-counter d-mannose supplement. Take this daily per packaging instructions. Start taking an over-the-counter probiotic containing the bacterial species called Lactobacillus. Take this daily. Start vaginal estrogen cream. Apply a pea-sized amount around the opening of the urethra every day for 2 weeks, then three times weekly forever.

## 2021-12-20 NOTE — Progress Notes (Signed)
12/20/2021 3:58 PM   Tracie Sheppard 04-Feb-1956 [id number]  CC: Chief Complaint  Patient presents with   Follow-up    1 week follow up uti   HPI: Tracie Sheppard is a 66 y.o. female with PMH microscopic hematuria and nephrolithiasis with benign cystoscopy in June 2023 who recently admitted with urosepsis who I treated outpatient for persistent complicated UTI who presents today for outpatient follow-up.  She is accompanied today by her daughter, Ander Purpura, who contributes to HPI.  Today she reports she feels much better than when I last saw her in clinic 11 days ago.  She finished p.o. Bactrim this morning.  Her fevers and flank pain have resolved.  She saw her PCP this morning with reports of some persistent brain fog.  She reports to me that she is getting winded a little bit easier than normal.  They wonder how long will take for her to feel back to normal after recent illness.  Urine microscopy was notable for 6-10 WBCs/hpf and 3-10 RBCs/hpf with no bacteria seen.  Urine culture has been ordered.  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  She wears pads throughout the day and they are damp.  She is minimally bothered by this.  PVR 0 mL.  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  She denies a history of sickle cell disease or trait, but she has never been tested for these either.  They are curious about UTI prevention strategies today and which urinary symptoms may warrant repeat visits to our clinic for further evaluation.  She denies a personal history of breast or ovarian cancer.  PMH: Past Medical History:  Diagnosis Date   Allergy    Seasonal   Anxiety    Bilateral carpal tunnel syndrome 05/30/2017   Chronic back pain    Depression, major, recurrent, moderate (HCC)    Diabetes mellitus without complication (HCC)     Prediabetic   High serum testosterone    HTN (hypertension) 09/12/2019   Kidney stone    Sleep apnea    Thyroid disease    did not show up in lab work but per other symptoms previous doctor started her on this    Surgical History: Past Surgical History:  Procedure Laterality Date   CHOLECYSTECTOMY     1997   COLONOSCOPY WITH PROPOFOL N/A 11/16/2018   Procedure: COLONOSCOPY WITH PROPOFOL;  Surgeon: Lin Landsman, MD;  Location: [facility] ENDOSCOPY;  Service: Gastroenterology;  Laterality: N/A;   CYSTOSCOPY  09/03/2021   ESOPHAGOGASTRODUODENOSCOPY (EGD) WITH PROPOFOL N/A 11/16/2018   Procedure: ESOPHAGOGASTRODUODENOSCOPY (EGD) WITH PROPOFOL;  Surgeon: Lin Landsman, MD;  Location: [facility];  Service: Gastroenterology;  Laterality: N/A;   NASAL SEPTUM SURGERY     OOPHORECTOMY     TUBAL LIGATION     1989    Home Medications:  Allergies as of 12/20/2021       Reactions   Aleve [naproxen Sodium] Other (See Comments)   Chest pain   Statins Other (See Comments)   myalgias        Medication List        Accurate as of December 20, 2021  3:58 PM. If you have any questions, ask your nurse or doctor.          acetaminophen 500 MG tablet Commonly known as: TYLENOL Tylenol Extra Strength 500 mg tablet  Alpha-Lipoic Acid 600 MG Caps Take 600 mg by mouth daily.   buPROPion 300 MG 24 hr tablet Commonly known as: WELLBUTRIN XL Take 1 tablet (300 mg total) by mouth daily.   cetirizine 10 MG tablet Commonly known as: ZYRTEC Take 1 tablet (10 mg total) by mouth daily as needed for allergies.   citalopram 40 MG tablet Commonly known as: CELEXA Take 1.5 tablets (60 mg total) by mouth daily.   clonazePAM 0.5 MG tablet Commonly known as: KLONOPIN TAKE 1 TABLET BY MOUTH DAILY AS NEEDED FOR ANIXETY What changed:  how much to take how to take this when to take this reasons to take this   Cyanocobalamin 1000 MCG Tbcr Take 1,000 mcg by mouth daily.    cyclobenzaprine 10 MG tablet Commonly known as: FLEXERIL Take 0.5-1 tablets (5-10 mg total) by mouth at bedtime.   diclofenac 75 MG EC tablet Commonly known as: VOLTAREN Take 1 tablet (75 mg total) by mouth 2 (two) times daily.   estradiol 0.1 MG/GM vaginal cream Commonly known as: ESTRACE Apply one pea-sized amount around the opening of the urethra daily for 2 weeks, then 3 times weekly moving forward. Started by: Debroah Loop, PA-C   fluticasone 50 MCG/ACT nasal spray Commonly known as: FLONASE SPRAY 1 SPRAY INTO EACH NOSTRIL EVERY DAY   ibuprofen 200 MG tablet Commonly known as: ADVIL Take 200 mg by mouth as needed.   ondansetron 4 MG disintegrating tablet Commonly known as: ZOFRAN-ODT Take 1 tablet (4 mg total) by mouth every 8 (eight) hours as needed for nausea or vomiting.   propranolol ER 60 MG 24 hr capsule Commonly known as: INDERAL LA Take 1 tablet by mouth daily.   spironolactone 100 MG tablet Commonly known as: ALDACTONE Take 1 tablet (100 mg total) by mouth 2 (two) times daily.   VITAMIN D PO Take by mouth daily. 1000 mcg        Allergies:  Allergies  Allergen Reactions   Aleve [Naproxen Sodium] Other (See Comments)    Chest pain   Statins Other (See Comments)    myalgias    Family History: Family History  Problem Relation Age of Onset   Diabetes Mother    Anemia Mother    Hyperlipidemia Mother    Arthritis Mother    Hypertension Father    Cancer Father    Celiac disease Sister    Diabetes Maternal Grandmother    Stroke Maternal Grandmother    Heart attack Maternal Grandmother    Heart disease Paternal Grandfather     Social History:   reports that she has never smoked. She has never used smokeless tobacco. She reports current alcohol use. She reports that she does not use drugs.  Physical Exam: BP 117/76   Pulse 94   Ht '5\' 4"'$  (1.626 m)   Wt 194 lb (88 kg)   LMP  (LMP Unknown)   BMI 33.30 kg/m   Constitutional:  Alert  and oriented, no acute distress, nontoxic appearing HEENT: [place], AT Cardiovascular: No clubbing, cyanosis, or edema Respiratory: Normal respiratory effort, no increased work of breathing Skin: No rashes, bruises or suspicious lesions Neurologic: Grossly intact, no focal deficits, moving all 4 extremities Psychiatric: Normal mood and affect  Laboratory Data: Results for orders placed or performed in visit on 12/20/21  Bladder Scan (Post Void Residual) in office  Result Value Ref Range   Scan Result 0 ml    Assessment & Plan:   1. Right flank pain Flank pain  and fevers have resolved after outpatient treatment.  We will recheck a CBC today for reassurance.  Agree with urine culture ordered by her PCP this morning, though I suspect it will come back negative in the setting of antibiotic use.  We discussed that if her status remains stable or deteriorates throughout the week, I would like her to call me on Thursday so I can add her on for lab visit for UA.  She is in agreement with this plan. - CBC  2. 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  We will obtain a sickle cell screening today.  If positive, we will have her follow-up with heme-onc.  If negative, will have her follow-up with nephrology given her persistent hematuria. - Sickle cell screen  3. History of recurrent UTI (urinary tract infection) We discussed seeking care in our clinic with dysuria, flank pain, or fevers.  I would like to start gathering culture data on her to establish a possible diagnosis of recurrent UTIs, as she does not currently meet the criteria for these.  We discussed UTI prevention strategies today including lactobacillus containing probiotics, cranberry supplements, d-mannose supplements, and vaginal estrogen cream.  We will have her start these. - estradiol (ESTRACE) 0.1 MG/GM vaginal cream; Apply one pea-sized amount around the opening of the urethra daily for 2  weeks, then 3 times weekly moving forward.  Dispense: 42.5 g; Refill: 12  4. Mixed incontinence urge and stress She is emptying appropriately and minimally bothered by these.  We discussed that vaginal estrogen cream as above may help with this symptom.  Otherwise, we will continue to monitor and consider pharmacotherapy in the future if she becomes increasingly bothered or if the symptoms worsen. - Bladder Scan (Post Void Residual) in office   Return if symptoms worsen or fail to improve.  I spent 45 minutes on the day of the encounter to include pre-visit record review, face-to-face time with the patient, and post-visit ordering of tests.   Debroah Loop, PA-C  [facility] [address], [place], [place] [address] [phone]

## 2021-12-20 NOTE — Progress Notes (Signed)
BP 122/76   Pulse 77   Temp 97.8 F (36.6 C)   Wt 194 lb 4.8 oz (88.1 kg)   LMP  (LMP Unknown)   SpO2 98%   BMI 33.35 kg/m    Subjective:    Patient ID: Tracie Sheppard, female    DOB: [birth year], 66 y.o.   MRN: [id number]  HPI: Tracie Sheppard is a 66 y.o. female  Chief Complaint  Patient presents with   Urinary Tract Infection    Patient states she is not having any more UTI symptoms but is still feeling very tired and her head feels foggy. Patient has appointment this afternoon with urology    [facility] has finished her abx. She notes that she has been feeling more tired and still feeling foggy. She notes that she went to [facility] yesterday and feels like she may have pushed it a bit. She is due to go out of town on Friday for a concert in [place] for her birthday. She has not had any urinary symptoms. No nausea. No fevers. No chills. She is otherwise doing OK. No other concerns or complaints at this time.    Relevant past medical, surgical, family and social history reviewed and updated as indicated. Interim medical history since our last visit reviewed. Allergies and medications reviewed and updated.  Review of Systems  Constitutional:  Positive for activity change and fatigue. Negative for appetite change, chills, diaphoresis, fever and unexpected weight change.  Respiratory: Negative.    Cardiovascular: Negative.   Gastrointestinal: Negative.   Musculoskeletal: Negative.   Neurological: Negative.   Psychiatric/Behavioral: Negative.      Per HPI unless specifically indicated above     Objective:    BP 122/76   Pulse 77   Temp 97.8 F (36.6 C)   Wt 194 lb 4.8 oz (88.1 kg)   LMP  (LMP Unknown)   SpO2 98%   BMI 33.35 kg/m   Wt Readings from Last 3 Encounters:  12/20/21 194 lb 4.8 oz (88.1 kg)  12/10/21 198 lb (89.8 kg)  12/09/21 197 lb (89.4 kg)    Physical Exam Vitals and nursing note reviewed.  Constitutional:      General: She is not in acute distress.     Appearance: Normal appearance. She is obese. She is not ill-appearing, toxic-appearing or diaphoretic.  HENT:     Head: Normocephalic and atraumatic.     Right Ear: External ear normal.     Left Ear: External ear normal.     Nose: Nose normal.     Mouth/Throat:     Mouth: Mucous membranes are moist.     Pharynx: Oropharynx is clear.  Eyes:     General: No scleral icterus.       Right eye: No discharge.        Left eye: No discharge.     Extraocular Movements: Extraocular movements intact.     Conjunctiva/sclera: Conjunctivae normal.     Pupils: Pupils are equal, round, and reactive to light.  Cardiovascular:     Rate and Rhythm: Normal rate and regular rhythm.     Pulses: Normal pulses.     Heart sounds: Normal heart sounds. No murmur heard.    No friction rub. No gallop.  Pulmonary:     Effort: Pulmonary effort is normal. No respiratory distress.     Breath sounds: Normal breath sounds. No stridor. No wheezing, rhonchi or rales.  Chest:     Chest wall: No tenderness.  Musculoskeletal:        General: Normal range of motion.     Cervical back: Normal range of motion and neck supple.  Skin:    General: Skin is warm and dry.     Capillary Refill: Capillary refill takes less than 2 seconds.     Coloration: Skin is not jaundiced or pale.     Findings: No bruising, erythema, lesion or rash.  Neurological:     General: No focal deficit present.     Mental Status: She is alert and oriented to person, place, and time. Mental status is at baseline.  Psychiatric:        Mood and Affect: Mood normal.        Behavior: Behavior normal.        Thought Content: Thought content normal.        Judgment: Judgment normal.     Results for orders placed or performed in visit on 12/20/21  Microscopic Examination   Urine  Result Value Ref Range   WBC, UA 6-10 (A) 0 - 5 /hpf   RBC, Urine 3-10 (A) 0 - 2 /hpf   Epithelial Cells (non renal) 0-10 0 - 10 /hpf   Mucus, UA Present (A) Not Estab.    Bacteria, UA None seen None seen/Few  Urinalysis, Routine w reflex microscopic  Result Value Ref Range   Specific Gravity, UA >1.030 (H) 1.005 - 1.030   pH, UA 6.0 5.0 - 7.5   Color, UA Yellow Yellow   Appearance Ur Clear Clear   Leukocytes,UA Trace (A) Negative   Protein,UA 2+ (A) Negative/Trace   Glucose, UA Negative Negative   Ketones, UA Negative Negative   RBC, UA 3+ (A) Negative   Bilirubin, UA Negative Negative   Urobilinogen, Ur 0.2 0.2 - 1.0 mg/dL   Nitrite, UA Negative Negative   Microscopic Examination See below:       Assessment & Plan:   Problem List Items Addressed This Visit   None Visit Diagnoses     Sepsis due to Escherichia coli with acute renal failure, unspecified acute renal failure type, unspecified whether septic shock present ([place])    -  Primary   Resolving. Has finished her abx. Seeing urology this PM. UA +leuks. Will send for culture. Continue to take it easy. Call with any concerns.    Relevant Orders   Urinalysis, Routine w reflex microscopic (Completed)   Pyuria       Will send for culture. Await results. Treat as needed.    Relevant Orders   Urine Culture        Follow up plan: Return in about 2 weeks (around 01/03/2022).  30 minutes spent with patient today.

## 2021-12-21 LAB — CBC
Hematocrit: 39.5 % (ref 34.0–46.6)
Hemoglobin: 12.8 g/dL (ref 11.1–15.9)
MCH: 28.8 pg (ref 26.6–33.0)
MCHC: 32.4 g/dL (ref 31.5–35.7)
MCV: 89 fL (ref 79–97)
Platelets: 397 10*3/uL (ref 150–450)
RBC: 4.45 x10E6/uL (ref 3.77–5.28)
RDW: 12.9 % (ref 11.7–15.4)
WBC: 8.8 10*3/uL (ref 3.4–10.8)

## 2021-12-21 LAB — SICKLE CELL SCREEN: Sickle Cell Screen: NEGATIVE

## 2021-12-22 LAB — URINE CULTURE

## 2021-12-23 ENCOUNTER — Other Ambulatory Visit: Payer: Self-pay | Admitting: Physician Assistant

## 2021-12-23 DIAGNOSIS — R3129 Other microscopic hematuria: Secondary | ICD-10-CM

## 2021-12-24 ENCOUNTER — Ambulatory Visit: Payer: BC Managed Care – PPO | Admitting: Physician Assistant

## 2021-12-29 ENCOUNTER — Encounter: Payer: Self-pay | Admitting: Family Medicine

## 2021-12-29 ENCOUNTER — Ambulatory Visit: Payer: BC Managed Care – PPO | Admitting: Family Medicine

## 2021-12-29 VITALS — BP 110/72 | HR 81 | Temp 97.7°F | Wt 194.0 lb

## 2021-12-29 DIAGNOSIS — R8290 Unspecified abnormal findings in urine: Secondary | ICD-10-CM | POA: Diagnosis not present

## 2021-12-29 DIAGNOSIS — F331 Major depressive disorder, recurrent, moderate: Secondary | ICD-10-CM | POA: Diagnosis not present

## 2021-12-29 DIAGNOSIS — F419 Anxiety disorder, unspecified: Secondary | ICD-10-CM | POA: Diagnosis not present

## 2021-12-29 LAB — URINALYSIS, ROUTINE W REFLEX MICROSCOPIC
Bilirubin, UA: NEGATIVE
Glucose, UA: NEGATIVE
Ketones, UA: NEGATIVE
Leukocytes,UA: NEGATIVE
Nitrite, UA: NEGATIVE
Specific Gravity, UA: >1.03 — ABNORMAL HIGH (ref 1.005–1.030)
Urobilinogen, Ur: 0.2 mg/dL (ref 0.2–1.0)
pH, UA: 6 (ref 5.0–7.5)

## 2021-12-29 LAB — MICROSCOPIC EXAMINATION: Bacteria, UA: NONE SEEN

## 2021-12-29 MED ORDER — SPIRONOLACTONE 100 MG PO TABS: 100.0000 mg | ORAL_TABLET | Freq: Two times a day (BID) | ORAL | 0 refills | Status: DC

## 2021-12-29 MED ORDER — BUPROPION HCL ER (XL) 300 MG PO TB24: 300.0000 mg | ORAL_TABLET | Freq: Every day | ORAL | 1 refills | Status: DC

## 2021-12-29 MED ORDER — CLONAZEPAM 0.5 MG PO TABS: ORAL_TABLET | ORAL | 0 refills | Status: DC

## 2021-12-29 MED ORDER — CITALOPRAM HYDROBROMIDE 40 MG PO TABS
60.0000 mg | ORAL_TABLET | Freq: Every day | ORAL | 1 refills | Status: DC
Start: 2021-12-29 — End: 2022-10-19

## 2021-12-29 NOTE — Assessment & Plan Note (Signed)
Under good control on current regimen. Continue current regimen. Continue to monitor. Call with any concerns. Refills given for 3 months. Follow up in 3 months.   

## 2021-12-29 NOTE — Progress Notes (Signed)
BP 110/72   Pulse 81   Temp 97.7 F (36.5 C)   Wt 194 lb (88 kg)   LMP  (LMP Unknown)   SpO2 97%   BMI 33.30 kg/m    Subjective:    Patient ID: Tracie Sheppard, female    DOB: 05-Aug-1955, 66 y.o.   MRN: [id number]  HPI: Tracie Sheppard is a 66 y.o. female  Chief Complaint  Patient presents with   Depression   Urinary Tract Infection    Patient states she is having lower abdominal pain, feels like cramping. Patient is concerned her UTI is coming back, thinks her urine looked a little cloudy today.    ANXIETY/STRESS Duration: chronic Status:stable Anxious mood: yes  Excessive worrying: yes Irritability: no  Sweating: no Nausea: no Palpitations:no Hyperventilation: no Panic attacks: yes Agoraphobia: no  Obscessions/compulsions: no Depressed mood: yes    12/29/2021   10:39 AM 12/20/2021   11:40 AM 09/28/2021   10:35 AM 07/08/2021    1:42 PM 06/29/2021   11:09 AM  Depression screen PHQ 2/9  Decreased Interest '1 3  1 1  '$ Down, Depressed, Hopeless 0 0 2 0 1  PHQ - 2 Score '1 3 2 1 2  '$ Altered sleeping '3 3 1 3 3  '$ Tired, decreased energy '3 3 3 2 2  '$ Change in appetite '3 3 2 3 3  '$ Feeling bad or failure about yourself  0 0 3 0 0  Trouble concentrating 2 2 0 1 1  Moving slowly or fidgety/restless 1 0 0 0 0  Suicidal thoughts 0 0 0 0 0  PHQ-9 Score '13 14 11 10 11  '$ Difficult doing work/chores Very difficult Somewhat difficult Somewhat difficult        12/29/2021   10:40 AM 12/20/2021   11:40 AM 09/28/2021   10:36 AM 07/08/2021    1:42 PM  GAD 7 : Generalized Anxiety Score  Nervous, Anxious, on Edge '1 1 1 1  '$ Control/stop worrying 1 2 0 1  Worry too much - different things '1 1  1  '$ Trouble relaxing 1 0 1 1  Restless 0 0 0 0  Easily annoyed or irritable 0 0 1 1  Afraid - awful might happen '1 1 1 1  '$ Total GAD 7 Score '5 5  6  '$ Anxiety Difficulty Very difficult Somewhat difficult Somewhat difficult Somewhat difficult   Anhedonia: no Weight changes: no Insomnia: no    Hypersomnia: no Fatigue/loss of energy: yes Feelings of worthlessness: no Feelings of guilt: no Impaired concentration/indecisiveness: no Suicidal ideations: no  Crying spells: no Recent Stressors/Life Changes: yes   Relationship problems: no   Family stress: yes     Financial stress: no    Job stress: no    Recent death/loss: no  URINARY SYMPTOMS Duration: few days Dysuria: no Urinary frequency: no Urgency: no Small volume voids: no Symptom severity: mild Urinary incontinence: no Foul odor: no Hematuria: no Abdominal pain: no Back pain: no Suprapubic pain/pressure: yes Flank pain: no Fever:  no Vomiting: no Relief with cranberry juice: yes Relief with pyridium: yes Status: worse Previous urinary tract infection: yes Recurrent urinary tract infection: no Sexual activity: monogomous History of sexually transmitted disease: no Vaginal discharge: no Treatments attempted: antibiotics and increasing fluids   Relevant past medical, surgical, family and social history reviewed and updated as indicated. Interim medical history since our last visit reviewed. Allergies and medications reviewed and updated.  Review of Systems  Constitutional: Negative.   Respiratory:  Negative.    Cardiovascular: Negative.   Gastrointestinal: Negative.   Genitourinary:  Positive for dysuria, frequency and urgency. Negative for decreased urine volume, difficulty urinating, dyspareunia, enuresis, flank pain, genital sores, hematuria, menstrual problem, pelvic pain, vaginal bleeding, vaginal discharge and vaginal pain.  Musculoskeletal: Negative.   Neurological: Negative.   Psychiatric/Behavioral: Negative.      Per HPI unless specifically indicated above     Objective:    BP 110/72   Pulse 81   Temp 97.7 F (36.5 C)   Wt 194 lb (88 kg)   LMP  (LMP Unknown)   SpO2 97%   BMI 33.30 kg/m   Wt Readings from Last 3 Encounters:  12/29/21 194 lb (88 kg)  12/20/21 194 lb (88 kg)   12/20/21 194 lb 4.8 oz (88.1 kg)    Physical Exam Vitals and nursing note reviewed.  Constitutional:      General: She is not in acute distress.    Appearance: Normal appearance. She is obese. She is not ill-appearing, toxic-appearing or diaphoretic.  HENT:     Head: Normocephalic and atraumatic.     Right Ear: External ear normal.     Left Ear: External ear normal.     Nose: Nose normal.     Mouth/Throat:     Mouth: Mucous membranes are moist.     Pharynx: Oropharynx is clear.  Eyes:     General: No scleral icterus.       Right eye: No discharge.        Left eye: No discharge.     Extraocular Movements: Extraocular movements intact.     Conjunctiva/sclera: Conjunctivae normal.     Pupils: Pupils are equal, round, and reactive to light.  Cardiovascular:     Rate and Rhythm: Normal rate and regular rhythm.     Pulses: Normal pulses.     Heart sounds: Normal heart sounds. No murmur heard.    No friction rub. No gallop.  Pulmonary:     Effort: Pulmonary effort is normal. No respiratory distress.     Breath sounds: Normal breath sounds. No stridor. No wheezing, rhonchi or rales.  Chest:     Chest wall: No tenderness.  Musculoskeletal:        General: Normal range of motion.     Cervical back: Normal range of motion and neck supple.  Skin:    General: Skin is warm and dry.     Capillary Refill: Capillary refill takes less than 2 seconds.     Coloration: Skin is not jaundiced or pale.     Findings: No bruising, erythema, lesion or rash.  Neurological:     General: No focal deficit present.     Mental Status: She is alert and oriented to person, place, and time. Mental status is at baseline.  Psychiatric:        Mood and Affect: Mood normal.        Behavior: Behavior normal.        Thought Content: Thought content normal.        Judgment: Judgment normal.     Results for orders placed or performed in visit on 12/20/21  Sickle cell screen  Result Value Ref Range    Sickle Cell Screen Negative Negative  CBC  Result Value Ref Range   WBC 8.8 3.4 - 10.8 x10E3/uL   RBC 4.45 3.77 - 5.28 x10E6/uL   Hemoglobin 12.8 11.1 - 15.9 g/dL   Hematocrit 39.5 34.0 - 46.6 %  MCV 89 79 - 97 fL   MCH 28.8 26.6 - 33.0 pg   MCHC 32.4 31.5 - 35.7 g/dL   RDW 12.9 11.7 - 15.4 %   Platelets 397 150 - 450 x10E3/uL  Bladder Scan (Post Void Residual) in office  Result Value Ref Range   Scan Result 0 ml       Assessment & Plan:   Problem List Items Addressed This Visit       Other   Depression, major, recurrent, moderate (HCC)    Under good control on current regimen. Continue current regimen. Continue to monitor. Call with any concerns. Refills given for 3 months. Follow up in 3 months.         Relevant Medications   citalopram (CELEXA) 40 MG tablet   buPROPion (WELLBUTRIN XL) 300 MG 24 hr tablet   Anxiety    Under good control on current regimen. Continue current regimen. Continue to monitor. Call with any concerns. Refills given for 3 months. Follow up in 3 months.         Relevant Medications   citalopram (CELEXA) 40 MG tablet   buPROPion (WELLBUTRIN XL) 300 MG 24 hr tablet   Other Visit Diagnoses     Cloudy urine    -  Primary   Trace blood. Await culture. Call with any concerns.    Relevant Orders   Urinalysis, Routine w reflex microscopic   Urine Culture        Follow up plan: Return in about 3 months (around 03/30/2022).

## 2021-12-30 ENCOUNTER — Encounter: Payer: Self-pay | Admitting: Family Medicine

## 2021-12-30 ENCOUNTER — Other Ambulatory Visit: Payer: Self-pay | Admitting: *Deleted

## 2021-12-30 DIAGNOSIS — N200 Calculus of kidney: Secondary | ICD-10-CM

## 2021-12-30 NOTE — Telephone Encounter (Signed)
Spoke with patient and advised results KUB ordered   Per Sam:  Please put her on my schedule tomorrow. I think she's passing a kidney stone on the right side. I can see her at 9am with a KUB prior and UA.

## 2021-12-31 ENCOUNTER — Other Ambulatory Visit: Payer: Self-pay | Admitting: Family Medicine

## 2021-12-31 ENCOUNTER — Ambulatory Visit
Admission: RE | Admit: 2021-12-31 | Discharge: 2021-12-31 | Disposition: A | Discharge status: Home / Self Care | Payer: BC Managed Care – PPO | Source: Ambulatory Visit | Attending: Physician Assistant | Admitting: Physician Assistant

## 2021-12-31 ENCOUNTER — Ambulatory Visit
Admission: RE | Admit: 2021-12-31 | Discharge: 2021-12-31 | Disposition: A | Discharge status: Home / Self Care | Payer: BC Managed Care – PPO | Attending: Physician Assistant | Admitting: Physician Assistant

## 2021-12-31 ENCOUNTER — Ambulatory Visit: Payer: BC Managed Care – PPO | Admitting: Physician Assistant

## 2021-12-31 ENCOUNTER — Encounter: Payer: Self-pay | Admitting: Physician Assistant

## 2021-12-31 VITALS — BP 130/80 | HR 96 | Ht 64.0 in | Wt 198.6 lb

## 2021-12-31 DIAGNOSIS — N200 Calculus of kidney: Secondary | ICD-10-CM

## 2021-12-31 DIAGNOSIS — R3989 Other symptoms and signs involving the genitourinary system: Secondary | ICD-10-CM | POA: Diagnosis not present

## 2021-12-31 DIAGNOSIS — Z87442 Personal history of urinary calculi: Secondary | ICD-10-CM | POA: Diagnosis present

## 2021-12-31 DIAGNOSIS — R109 Unspecified abdominal pain: Secondary | ICD-10-CM

## 2021-12-31 LAB — URINALYSIS, COMPLETE
Bilirubin, UA: NEGATIVE
Glucose, UA: NEGATIVE
Ketones, UA: NEGATIVE
Nitrite, UA: NEGATIVE
Specific Gravity, UA: 1.025 (ref 1.005–1.030)
Urobilinogen, Ur: 0.2 mg/dL (ref 0.2–1.0)
pH, UA: 5.5 (ref 5.0–7.5)

## 2021-12-31 LAB — MICROSCOPIC EXAMINATION

## 2021-12-31 LAB — URINE CULTURE

## 2021-12-31 MED ORDER — SULFAMETHOXAZOLE-TRIMETHOPRIM 800-160 MG PO TABS: 1.0000 | ORAL_TABLET | Freq: Two times a day (BID) | ORAL | 0 refills | Status: DC

## 2021-12-31 MED ORDER — CIPROFLOXACIN HCL 500 MG PO TABS: 500.0000 mg | ORAL_TABLET | Freq: Two times a day (BID) | ORAL | 0 refills | Status: DC

## 2021-12-31 NOTE — Progress Notes (Signed)
12/31/2021 3:22 PM   Tracie Sheppard 1955-06-07 [id number]  CC: Chief Complaint  Patient presents with   Follow-up   HPI: Tracie Sheppard is a 66 y.o. female with PMH microscopic hematuria and nephrolithiasis with benign cystoscopy in June 2023 recently admitted with urosepsis who presents today for evaluation of possible acute stone episode versus UTI.   Today she reports 2-day history of bladder pain feeling similar to menstrual cramps, dysuria, and frequency.  She developed right flank pain and RLQ discomfort yesterday.  She has been taking tramadol and using a heating pad.  She has had some chills and nausea responsive to Zofran.  She denies fever or vomiting.  She saw her PCP 2 days ago and a UA performed in that office was notable only for microscopic hematuria.  KUB today with no radiopaque ureteral stones.  Stat renal ultrasound with no hydronephrosis.  Her right renal cyst measures smaller than prior with interval resolution of hemorrhagic contents.  In-office UA today positive for 2+ blood, 1+ protein, and trace leukocytes; urine microscopy with 6-10 WBCs/HPF, 11-30 RBCs/HPF, and moderate bacteria.  PMH: Past Medical History:  Diagnosis Date   Allergy    Seasonal   Anxiety    Bilateral carpal tunnel syndrome 05/30/2017   Chronic back pain    Depression, major, recurrent, moderate (HCC)    Diabetes mellitus without complication (HCC)    Prediabetic   High serum testosterone    HTN (hypertension) 09/12/2019   Kidney stone    Sleep apnea    Thyroid disease    did not show up in lab work but per other symptoms previous doctor started her on this    Surgical History: Past Surgical History:  Procedure Laterality Date   CHOLECYSTECTOMY     1997   COLONOSCOPY WITH PROPOFOL N/A 11/16/2018   Procedure: COLONOSCOPY WITH PROPOFOL;  Surgeon: Lin Landsman, MD;  Location: [facility] END[facility]COPY;  Service: Gastroenterology;  Laterality: N/A;   CYSTOSCOPY  09/03/2021    ESOPHAGOGASTRODUODENOSCOPY (EGD) WITH PROPOFOL N/A 11/16/2018   Procedure: ESOPHAGOGASTRODUODENOSCOPY (EGD) WITH PROPOFOL;  Surgeon: Lin Landsman, MD;  Location: [place];  Service: Gastroenterology;  Laterality: N/A;   NASAL SEPTUM SURGERY     OOPHORECTOMY     TUBAL LIGATION     1989    Home Medications:  Allergies as of 12/31/2021       Reactions   Aleve [naproxen Sodium] Other (See Comments)   Chest pain   Statins Other (See Comments)   myalgias        Medication List        Accurate as of December 31, 2021  3:22 PM. If you have any questions, ask your nurse or doctor.          acetaminophen 500 MG tablet Commonly known as: TYLENOL Tylenol Extra Strength 500 mg tablet   Alpha-Lipoic Acid 600 MG Caps Take 600 mg by mouth daily.   buPROPion 300 MG 24 hr tablet Commonly known as: WELLBUTRIN XL Take 1 tablet (300 mg total) by mouth daily.   cetirizine 10 MG tablet Commonly known as: ZYRTEC Take 1 tablet (10 mg total) by mouth daily as needed for allergies.   citalopram 40 MG tablet Commonly known as: CELEXA Take 1.5 tablets (60 mg total) by mouth daily.   clonazePAM 0.5 MG tablet Commonly known as: KLONOPIN TAKE 1 TABLET BY MOUTH DAILY AS NEEDED FOR ANIXETY   Cyanocobalamin 1000 MCG Tbcr Take 1,000 mcg by mouth daily.  cyclobenzaprine 10 MG tablet Commonly known as: FLEXERIL Take 0.5-1 tablets (5-10 mg total) by mouth at bedtime.   diclofenac 75 MG EC tablet Commonly known as: VOLTAREN Take 1 tablet (75 mg total) by mouth 2 (two) times daily.   estradiol 0.1 MG/GM vaginal cream Commonly known as: ESTRACE Apply one pea-sized amount around the opening of the urethra daily for 2 weeks, then 3 times weekly moving forward.   fluticasone 50 MCG/ACT nasal spray Commonly known as: FLONASE SPRAY 1 SPRAY INTO EACH NOSTRIL EVERY DAY   ibuprofen 200 MG tablet Commonly known as: ADVIL Take 200 mg by mouth as needed.   ondansetron 4 MG  disintegrating tablet Commonly known as: ZOFRAN-ODT Take 1 tablet (4 mg total) by mouth every 8 (eight) hours as needed for nausea or vomiting.   propranolol ER 60 MG 24 hr capsule Commonly known as: INDERAL LA Take 1 tablet by mouth daily.   spironolactone 100 MG tablet Commonly known as: ALDACTONE Take 1 tablet (100 mg total) by mouth 2 (two) times daily.   sulfamethoxazole-trimethoprim 800-160 MG tablet Commonly known as: BACTRIM DS Take 1 tablet by mouth 2 (two) times daily for 7 days. Started by: Debroah Loop, PA-C   VITAMIN D PO Take by mouth daily. 1000 mcg        Allergies:  Allergies  Allergen Reactions   Aleve [Naproxen Sodium] Other (See Comments)    Chest pain   Statins Other (See Comments)    myalgias    Family History: Family History  Problem Relation Age of Onset   Diabetes Mother    Anemia Mother    Hyperlipidemia Mother    Arthritis Mother    Hypertension Father    Cancer Father    Celiac disease Sister    Diabetes Maternal Grandmother    Stroke Maternal Grandmother    Heart attack Maternal Grandmother    Heart disease Paternal Grandfather     Social History:   reports that she has never smoked. She has never used smokeless tobacco. She reports current alcohol use. She reports that she does not use drugs.  Physical Exam: BP 130/80   Pulse 96   Ht '5\' 4"'$  (1.626 m)   Wt 198 lb 9.6 oz (90.1 kg)   LMP  (LMP Unknown)   BMI 34.09 kg/m   Constitutional:  Alert and oriented, no acute distress, nontoxic appearing HEENT: [place], AT Cardiovascular: No clubbing, cyanosis, or edema Respiratory: Normal respiratory effort, no increased work of breathing Skin: No rashes, bruises or suspicious lesions Neurologic: Grossly intact, no focal deficits, moving all 4 extremities Psychiatric: Normal mood and affect  Laboratory Data: Results for orders placed or performed in visit on 12/31/21  Microscopic Examination   Urine  Result Value Ref Range    WBC, UA 6-10 (A) 0 - 5 /hpf   RBC, Urine 11-30 (A) 0 - 2 /hpf   Epithelial Cells (non renal) 0-10 0 - 10 /hpf   Casts Present (A) None seen /lpf   Cast Type Granular casts (A) N/A   Bacteria, UA Moderate (A) None seen/Few  Urinalysis, Complete  Result Value Ref Range   Specific Gravity, UA 1.025 1.005 - 1.030   pH, UA 5.5 5.0 - 7.5   Color, UA Yellow Yellow   Appearance Ur Clear Clear   Leukocytes,UA Trace (A) Negative   Protein,UA 1+ (A) Negative/Trace   Glucose, UA Negative Negative   Ketones, UA Negative Negative   RBC, UA 2+ (A) Negative  Bilirubin, UA Negative Negative   Urobilinogen, Ur 0.2 0.2 - 1.0 mg/dL   Nitrite, UA Negative Negative   Microscopic Examination See below:    Pertinent Imaging: Renal ultrasound, 12/31/2021: CLINICAL DATA:  Right flank pain.   EXAM: RENAL / URINARY TRACT ULTRASOUND COMPLETE   COMPARISON:  December 09, 2021.  November 29, 2021.   FINDINGS: Right Kidney:   Renal measurements: 12.0 x 6.6 x 4.5 cm = volume: 185 mL. 1.7 x 1.4 x 1.0 cm cyst is noted in right kidney which is significantly smaller compared to prior exam. Echogenicity within normal limits. No mass or hydronephrosis visualized.   Left Kidney:   Renal measurements: 10.7 x 5.8 x 5.6 cm = volume: 181 mL. Echogenicity within normal limits. No mass or hydronephrosis visualized.   Bladder:   Appears normal for degree of bladder distention.   Other:   None.   IMPRESSION: No hydronephrosis or renal obstruction is noted.   1.7 cm cyst is noted in right kidney which is significantly decreased compared to prior exam, where it appears to contain some degree of hemorrhage which now has apparently resolved.     Electronically Signed   By: Marijo Conception M.D.   On: 12/31/2021 11:34  I personally reviewed the images referenced above and note no hydronephrosis and present bilateral ureteral jets.  Assessment & Plan:   1. Bladder pain UA today notable for pyuria and  bacteriuria, microscopic hematuria is typical for her at baseline.  No evidence of obstructive uropathy on imaging today.  I started her on empiric Bactrim, however this afternoon after she left clinic her urine culture from her PCP resulted with E faecalis and they started her on culture appropriate Cipro instead.  Agree with this antibiotic therapy. - Urinalysis, Complete - CULTURE, URINE COMPREHENSIVE - Microscopic Examination  2. Flank pain with history of urolithiasis No evidence of obstructive uropathy on renal ultrasound today, will treat for UTI as above. - US RENAL; Future   Return if symptoms worsen or fail to improve.  Debroah Loop, PA-C  [facility] [address] [place], [place] [address] [phone]

## 2022-01-04 ENCOUNTER — Other Ambulatory Visit: Payer: Self-pay | Admitting: Physician Assistant

## 2022-01-04 DIAGNOSIS — N200 Calculus of kidney: Secondary | ICD-10-CM

## 2022-01-04 LAB — CULTURE, URINE COMPREHENSIVE

## 2022-01-05 ENCOUNTER — Other Ambulatory Visit: Payer: BC Managed Care – PPO

## 2022-01-05 DIAGNOSIS — N200 Calculus of kidney: Secondary | ICD-10-CM

## 2022-01-12 LAB — CALCULI, WITH PHOTOGRAPH (CLINICAL LAB)
Calcium Oxalate Dihydrate: 90 %
Calcium Oxalate Monohydrate: 10 %
Weight Calculi: 121 mg

## 2022-01-12 NOTE — Addendum Note (Signed)
Addended by: Valerie Roys on: 01/12/2022 08:43 AM   Modules accepted: Level of Service

## 2022-03-30 ENCOUNTER — Encounter: Payer: Self-pay | Admitting: Family Medicine

## 2022-03-30 ENCOUNTER — Ambulatory Visit: Payer: BC Managed Care – PPO | Admitting: Family Medicine

## 2022-03-30 ENCOUNTER — Ambulatory Visit (INDEPENDENT_AMBULATORY_CARE_PROVIDER_SITE_OTHER): Payer: BC Managed Care – PPO | Admitting: Family Medicine

## 2022-03-30 VITALS — BP 130/80 | HR 85 | Temp 98.0°F | Ht 64.0 in | Wt 202.3 lb

## 2022-03-30 DIAGNOSIS — E782 Mixed hyperlipidemia: Secondary | ICD-10-CM

## 2022-03-30 DIAGNOSIS — Z0000 Encounter for general adult medical examination without abnormal findings: Secondary | ICD-10-CM

## 2022-03-30 DIAGNOSIS — I10 Essential (primary) hypertension: Secondary | ICD-10-CM

## 2022-03-30 NOTE — Progress Notes (Signed)
Early for her physical- will cancel today's appt and follow up 04/12/21.

## 2022-04-15 ENCOUNTER — Encounter: Payer: Self-pay | Admitting: Family Medicine

## 2022-04-15 ENCOUNTER — Ambulatory Visit: Payer: BC Managed Care – PPO | Admitting: Family Medicine

## 2022-04-15 VITALS — BP 143/85 | HR 73 | Temp 98.3°F | Ht 64.75 in | Wt 202.9 lb

## 2022-04-15 DIAGNOSIS — Z789 Other specified health status: Secondary | ICD-10-CM | POA: Diagnosis not present

## 2022-04-15 DIAGNOSIS — E782 Mixed hyperlipidemia: Secondary | ICD-10-CM | POA: Diagnosis not present

## 2022-04-15 DIAGNOSIS — Z0000 Encounter for general adult medical examination without abnormal findings: Secondary | ICD-10-CM | POA: Diagnosis not present

## 2022-04-15 DIAGNOSIS — I10 Essential (primary) hypertension: Secondary | ICD-10-CM

## 2022-04-15 DIAGNOSIS — F331 Major depressive disorder, recurrent, moderate: Secondary | ICD-10-CM

## 2022-04-15 DIAGNOSIS — Z23 Encounter for immunization: Secondary | ICD-10-CM

## 2022-04-15 DIAGNOSIS — F419 Anxiety disorder, unspecified: Secondary | ICD-10-CM

## 2022-04-15 LAB — URINALYSIS, ROUTINE W REFLEX MICROSCOPIC
Bilirubin, UA: NEGATIVE
Glucose, UA: NEGATIVE
Ketones, UA: NEGATIVE
Leukocytes,UA: NEGATIVE
Nitrite, UA: NEGATIVE
Specific Gravity, UA: >1.03 — ABNORMAL HIGH (ref 1.005–1.030)
Urobilinogen, Ur: 0.2 mg/dL (ref 0.2–1.0)
pH, UA: 5.5 (ref 5.0–7.5)

## 2022-04-15 LAB — MICROSCOPIC EXAMINATION: Bacteria, UA: NONE SEEN

## 2022-04-15 LAB — MICROALBUMIN, URINE WAIVED
Creatinine, Urine Waived: 300 mg/dL (ref 10–300)
Microalb, Ur Waived: 80 mg/L — ABNORMAL HIGH (ref 0–19)

## 2022-04-15 LAB — BAYER DCA HB A1C WAIVED: HB A1C (BAYER DCA - WAIVED): 5.7 % — ABNORMAL HIGH (ref 4.8–5.6)

## 2022-04-15 MED ORDER — CETIRIZINE HCL 10 MG PO TABS: 10.0000 mg | ORAL_TABLET | Freq: Every day | ORAL | 3 refills | Status: DC | PRN

## 2022-04-15 MED ORDER — FLUTICASONE PROPIONATE 50 MCG/ACT NA SUSP: NASAL | 12 refills | Status: DC

## 2022-04-15 MED ORDER — CLONAZEPAM 0.5 MG PO TABS: ORAL_TABLET | ORAL | 0 refills | Status: DC

## 2022-04-15 MED ORDER — SPIRONOLACTONE 100 MG PO TABS: 100.0000 mg | ORAL_TABLET | Freq: Two times a day (BID) | ORAL | 1 refills | Status: DC

## 2022-04-15 NOTE — Assessment & Plan Note (Signed)
Rechecking labs today. Await results. Treat as needed.  °

## 2022-04-15 NOTE — Assessment & Plan Note (Signed)
Under good control on current regimen. Continue current regimen. Continue to monitor. Call with any concerns. Refills given.   

## 2022-04-15 NOTE — Assessment & Plan Note (Signed)
Unable to tolerate statins. Continue to monitor.  °

## 2022-04-15 NOTE — Assessment & Plan Note (Signed)
Under good control on current regimen. Continue current regimen. Continue to monitor. Call with any concerns. Refills given. Labs drawn today.   

## 2022-04-15 NOTE — Progress Notes (Signed)
BP (!) 143/85 (BP Location: Left Arm, Cuff Size: Normal)   Pulse 73   Temp 98.3 F (36.8 C) (Oral)   Ht 5' 4.75" (1.645 m)   Wt 202 lb 14.4 oz (92 kg)   LMP  (LMP Unknown)   SpO2 97%   BMI 34.03 kg/m    Subjective:    Patient ID: Tracie Sheppard, female    DOB: [birth year], 67 y.o.   MRN: [id number]  HPI: TAYLR MEUTH is a 67 y.o. female presenting on 04/15/2022 for comprehensive medical examination. Current medical complaints include:  HYPERTENSION / HYPERLIPIDEMIAyes Satisfied with current treatment? yes Duration of hypertension: chronic BP monitoring frequency: not checking BP medication side effects: no Past BP meds: spironalactone Duration of hyperlipidemia: chronic Cholesterol medication side effects: no Cholesterol supplements: none Past cholesterol medications: none Medication compliance: excellent compliance Aspirin: no Recent stressors: yes Recurrent headaches: no Visual changes: no Palpitations: no Dyspnea: no Chest pain: no Lower extremity edema: no Dizzy/lightheaded: no  ANXIETY/STRESS Duration: chronic Status:stable Anxious mood: no  Excessive worrying: no Irritability: no  Sweating: no Nausea: no Palpitations:no Hyperventilation: no Panic attacks: no Agoraphobia: no  Obscessions/compulsions: no Depressed mood: no    04/15/2022    9:55 AM 12/29/2021   10:39 AM 12/20/2021   11:40 AM 09/28/2021   10:35 AM 07/08/2021    1:42 PM  Depression screen PHQ 2/9  Decreased Interest '1 1 3  1  '$ Down, Depressed, Hopeless 1 0 0 2 0  PHQ - 2 Score '2 1 3 2 1  '$ Altered sleeping '3 3 3 1 3  '$ Tired, decreased energy '2 3 3 3 2  '$ Change in appetite '3 3 3 2 3  '$ Feeling bad or failure about yourself  1 0 0 3 0  Trouble concentrating 0 2 2 0 1  Moving slowly or fidgety/restless 0 1 0 0 0  Suicidal thoughts 0 0 0 0 0  PHQ-9 Score '11 13 14 11 10  '$ Difficult doing work/chores Somewhat difficult Very difficult Somewhat difficult Somewhat difficult       04/15/2022     9:55 AM 12/29/2021   10:40 AM 12/20/2021   11:40 AM 09/28/2021   10:36 AM  GAD 7 : Generalized Anxiety Score  Nervous, Anxious, on Edge '1 1 1 1  '$ Control/stop worrying 0 1 2 0  Worry too much - different things '1 1 1   '$ Trouble relaxing 1 1 0 1  Restless 0 0 0 0  Easily annoyed or irritable 0 0 0 1  Afraid - awful might happen '1 1 1 1  '$ Total GAD 7 Score '4 5 5   '$ Anxiety Difficulty  Very difficult Somewhat difficult Somewhat difficult   Anhedonia: no Weight changes: no Insomnia: no   Hypersomnia: no Fatigue/loss of energy: no Feelings of worthlessness: no Feelings of guilt: no Impaired concentration/indecisiveness: no Suicidal ideations: no  Crying spells: no Recent Stressors/Life Changes: yes   Relationship problems: no   Family stress: yes     Financial stress: no    Job stress: no    Recent death/loss: no  She currently lives with: husband Menopausal Symptoms: no  Depression Screen done today and results listed below:     04/15/2022    9:55 AM 12/29/2021   10:39 AM 12/20/2021   11:40 AM 09/28/2021   10:35 AM 07/08/2021    1:42 PM  Depression screen PHQ 2/9  Decreased Interest '1 1 3  1  '$ Down, Depressed, Hopeless 1 0 0  2 0  PHQ - 2 Score '2 1 3 2 1  '$ Altered sleeping '3 3 3 1 3  '$ Tired, decreased energy '2 3 3 3 2  '$ Change in appetite '3 3 3 2 3  '$ Feeling bad or failure about yourself  1 0 0 3 0  Trouble concentrating 0 2 2 0 1  Moving slowly or fidgety/restless 0 1 0 0 0  Suicidal thoughts 0 0 0 0 0  PHQ-9 Score '11 13 14 11 10  '$ Difficult doing work/chores Somewhat difficult Very difficult Somewhat difficult Somewhat difficult     Past Medical History:  Past Medical History:  Diagnosis Date   Allergy    Seasonal   Anxiety    Bilateral carpal tunnel syndrome 05/30/2017   Chronic back pain    Depression, major, recurrent, moderate (HCC)    Diabetes mellitus without complication (HCC)    Prediabetic   High serum testosterone    HTN (hypertension) 09/12/2019   Kidney  stone    Sleep apnea    Thyroid disease    did not show up in lab work but per other symptoms previous doctor started her on this    Surgical History:  Past Surgical History:  Procedure Laterality Date   CHOLECYSTECTOMY     1997   COLONOSCOPY WITH PROPOFOL N/A 11/16/2018   Procedure: COLONOSCOPY WITH PROPOFOL;  Surgeon: Lin Landsman, MD;  Location: [facility] ENDOSCOPY;  Service: Gastroenterology;  Laterality: N/A;   CYSTOSCOPY  09/03/2021   ESOPHAGOGASTRODUODENOSCOPY (EGD) WITH PROPOFOL N/A 11/16/2018   Procedure: ESOPHAGOGASTRODUODENOSCOPY (EGD) WITH PROPOFOL;  Surgeon: Lin Landsman, MD;  Location: [place];  Service: Gastroenterology;  Laterality: N/A;   NASAL SEPTUM SURGERY     OOPHORECTOMY     TUBAL LIGATION     1989    Medications:  Current Outpatient Medications on File Prior to Visit  Medication Sig   acetaminophen (TYLENOL) 500 MG tablet Tylenol Extra Strength 500 mg tablet   Alpha-Lipoic Acid 600 MG CAPS Take 600 mg by mouth daily.   buPROPion (WELLBUTRIN XL) 300 MG 24 hr tablet Take 1 tablet (300 mg total) by mouth daily.   citalopram (CELEXA) 40 MG tablet Take 1.5 tablets (60 mg total) by mouth daily.   Cyanocobalamin 1000 MCG TBCR Take 1,000 mcg by mouth daily.   cyclobenzaprine (FLEXERIL) 10 MG tablet Take 0.5-1 tablets (5-10 mg total) by mouth at bedtime.   diclofenac (VOLTAREN) 75 MG EC tablet Take 1 tablet (75 mg total) by mouth 2 (two) times daily.   estradiol (ESTRACE) 0.1 MG/GM vaginal cream Apply one pea-sized amount around the opening of the urethra daily for 2 weeks, then 3 times weekly moving forward.   propranolol ER (INDERAL LA) 60 MG 24 hr capsule Take 1 tablet by mouth daily.   VITAMIN D PO Take by mouth daily. 1000 mcg   No current facility-administered medications on file prior to visit.    Allergies:  Allergies  Allergen Reactions   Aleve [Naproxen Sodium] Other (See Comments)    Chest pain   Statins Other (See Comments)     myalgias    Social History:  Social History   Socioeconomic History   Marital status: Married    Spouse name: Not on file   Number of children: 1   Years of education: 14   Highest education level: Not on file  Occupational History   Occupation: unemployed  Tobacco Use   Smoking status: Never   Smokeless tobacco: Never  Vaping  Use   Vaping Use: Never used  Substance and Sexual Activity   Alcohol use: Yes    Comment: On occasion   Drug use: No   Sexual activity: Yes    Birth control/protection: Post-menopausal, None  Other Topics Concern   Not on file  Social History Narrative   Lives with spouse   Caffeine use: 1-2 drinks per day   Right handed    Social Determinants of Health   Financial Resource Strain: Not on file  Food Insecurity: Not on file  Transportation Needs: Not on file  Physical Activity: Not on file  Stress: Not on file  Social Connections: Not on file  Intimate Partner Violence: Not on file   Social History   Tobacco Use  Smoking Status Never  Smokeless Tobacco Never   Social History   Substance and Sexual Activity  Alcohol Use Yes   Comment: On occasion    Family History:  Family History  Problem Relation Age of Onset   Diabetes Mother    Anemia Mother    Hyperlipidemia Mother    Arthritis Mother    Hypertension Father    Cancer Father    Celiac disease Sister    Diabetes Maternal Grandmother    Stroke Maternal Grandmother    Heart attack Maternal Grandmother    Heart disease Paternal Grandfather     Past medical history, surgical history, medications, allergies, family history and social history reviewed with patient today and changes made to appropriate areas of the chart.   Review of Systems  Constitutional: Negative.   HENT: Negative.    Eyes: Negative.   Respiratory: Negative.    Cardiovascular: Negative.   Gastrointestinal:  Positive for abdominal pain, constipation, diarrhea and heartburn (with food choices).  Negative for blood in stool, melena, nausea and vomiting.  Genitourinary:  Positive for dysuria. Negative for flank pain, frequency, hematuria and urgency.  Musculoskeletal:  Positive for back pain, joint pain and myalgias. Negative for falls and neck pain.  Skin: Negative.   Neurological:  Positive for tingling (in her hands) and weakness. Negative for dizziness, tremors, sensory change, speech change, focal weakness, seizures, loss of consciousness and headaches.  Endo/Heme/Allergies: Negative.   Psychiatric/Behavioral:  Negative for depression, hallucinations, memory loss, substance abuse and suicidal ideas. The patient is nervous/anxious. The patient does not have insomnia.    All other ROS negative except what is listed above and in the HPI.      Objective:    BP (!) 143/85 (BP Location: Left Arm, Cuff Size: Normal)   Pulse 73   Temp 98.3 F (36.8 C) (Oral)   Ht 5' 4.75" (1.645 m)   Wt 202 lb 14.4 oz (92 kg)   LMP  (LMP Unknown)   SpO2 97%   BMI 34.03 kg/m   Wt Readings from Last 3 Encounters:  04/15/22 202 lb 14.4 oz (92 kg)  03/30/22 202 lb 4.8 oz (91.8 kg)  12/31/21 198 lb 9.6 oz (90.1 kg)    Physical Exam Vitals and nursing note reviewed.  Constitutional:      General: She is not in acute distress.    Appearance: Normal appearance. She is obese. She is not ill-appearing, toxic-appearing or diaphoretic.  HENT:     Head: Normocephalic and atraumatic.     Right Ear: Tympanic membrane, ear canal and external ear normal. There is no impacted cerumen.     Left Ear: Tympanic membrane, ear canal and external ear normal. There is no impacted cerumen.  Nose: Nose normal. No congestion or rhinorrhea.     Mouth/Throat:     Mouth: Mucous membranes are moist.     Pharynx: Oropharynx is clear. No oropharyngeal exudate or posterior oropharyngeal erythema.  Eyes:     General: No scleral icterus.       Right eye: No discharge.        Left eye: No discharge.     Extraocular  Movements: Extraocular movements intact.     Conjunctiva/sclera: Conjunctivae normal.     Pupils: Pupils are equal, round, and reactive to light.  Neck:     Vascular: No carotid bruit.  Cardiovascular:     Rate and Rhythm: Normal rate and regular rhythm.     Pulses: Normal pulses.     Heart sounds: No murmur heard.    No friction rub. No gallop.  Pulmonary:     Effort: Pulmonary effort is normal. No respiratory distress.     Breath sounds: Normal breath sounds. No stridor. No wheezing, rhonchi or rales.  Chest:     Chest wall: No tenderness.  Abdominal:     General: Abdomen is flat. Bowel sounds are normal. There is no distension.     Palpations: Abdomen is soft. There is no mass.     Tenderness: There is no abdominal tenderness. There is no right CVA tenderness, left CVA tenderness, guarding or rebound.     Hernia: No hernia is present.  Genitourinary:    Comments: Breast and pelvic exams deferred with shared decision making Musculoskeletal:        General: No swelling, tenderness, deformity or signs of injury.     Cervical back: Normal range of motion and neck supple. No rigidity. No muscular tenderness.     Right lower leg: No edema.     Left lower leg: No edema.  Lymphadenopathy:     Cervical: No cervical adenopathy.  Skin:    General: Skin is warm and dry.     Capillary Refill: Capillary refill takes less than 2 seconds.     Coloration: Skin is not jaundiced or pale.     Findings: No bruising, erythema, lesion or rash.  Neurological:     General: No focal deficit present.     Mental Status: She is alert and oriented to person, place, and time. Mental status is at baseline.     Cranial Nerves: No cranial nerve deficit.     Sensory: No sensory deficit.     Motor: No weakness.     Coordination: Coordination normal.     Gait: Gait normal.     Deep Tendon Reflexes: Reflexes normal.  Psychiatric:        Mood and Affect: Mood normal.        Behavior: Behavior normal.         Thought Content: Thought content normal.        Judgment: Judgment normal.     Results for orders placed or performed in visit on 01/05/22  Calculi, with Photograph (to Clinical Lab)  Result Value Ref Range   Source Calculi Comment    Color Calculi Tan    Size Calculi 5x4 mm   Weight Calculi 121 mg   Composition Calculi Comment    Calcium Oxalate Monohydrate 10 %   Calcium Oxalate Dihydrate 90 %   Photo Calculi Comment    Comment Calculi 3 Comment    Please Note: Comment    DISCLAIMER: Comment       Assessment & Plan:  Problem List Items Addressed This Visit       Cardiovascular and Mediastinum   HTN (hypertension)    Under good control on current regimen. Continue current regimen. Continue to monitor. Call with any concerns. Refills given. Labs drawn today.        Relevant Medications   spironolactone (ALDACTONE) 100 MG tablet     Other   Hyperlipidemia    Rechecking labs today. Await results. Treat as needed.       Relevant Medications   spironolactone (ALDACTONE) 100 MG tablet   Depression, major, recurrent, moderate (HCC)    Under good control on current regimen. Continue current regimen. Continue to monitor. Call with any concerns. Refills given.        Statin intolerance    Unable to tolerate statins. Continue to monitor.       Anxiety    Under good control on current regimen. Continue current regimen. Continue to monitor. Call with any concerns. Refills given.       Other Visit Diagnoses     Routine general medical examination at a health care facility    -  Primary   Vaccines up to date. Screening labs checked today. Pap, mammo, colonoscopy up to date. Continue diet and exercise. Call with any concerns.   Relevant Orders   Microalbumin, Urine Waived   CBC with Differential/Platelet   Comprehensive metabolic panel   Lipid Panel w/o Chol/HDL Ratio   Urinalysis, Routine w reflex microscopic   TSH   Bayer DCA Hb A1c Waived   Needs flu shot        Flu shot given today.   Relevant Orders   Flu Vaccine QUAD 6+ mos PF IM (Fluarix Quad PF)        Follow up plan: Return in about 6 months (around 10/14/2022).   LABORATORY TESTING:  - Pap smear: up to date  IMMUNIZATIONS:   - Tdap: Tetanus vaccination status reviewed: last tetanus booster within 10 years. - Influenza: Administered today - Pneumovax: Up to date - Prevnar: Up to date - COVID: Up to date  SCREENING: -Mammogram: Up to date  - Colonoscopy: Up to date   PATIENT COUNSELING:   Advised to take 1 mg of folate supplement per day if capable of pregnancy.   Sexuality: Discussed sexually transmitted diseases, partner selection, use of condoms, avoidance of unintended pregnancy  and contraceptive alternatives.   Advised to avoid cigarette smoking.  I discussed with the patient that most people either abstain from alcohol or drink within safe limits (<=14/week and <=4 drinks/occasion for males, <=7/weeks and <= 3 drinks/occasion for females) and that the risk for alcohol disorders and other health effects rises proportionally with the number of drinks per week and how often a drinker exceeds daily limits.  Discussed cessation/primary prevention of drug use and availability of treatment for abuse.   Diet: Encouraged to adjust caloric intake to maintain  or achieve ideal body weight, to reduce intake of dietary saturated fat and total fat, to limit sodium intake by avoiding high sodium foods and not adding table salt, and to maintain adequate dietary potassium and calcium preferably from fresh fruits, vegetables, and low-fat dairy products.    stressed the importance of regular exercise  Injury prevention: Discussed safety belts, safety helmets, smoke detector, smoking near bedding or upholstery.   Dental health: Discussed importance of regular tooth brushing, flossing, and dental visits.    NEXT PREVENTATIVE PHYSICAL DUE IN 1 YEAR. Return in about 6 months (around  10/14/2022).           

## 2022-04-16 LAB — LIPID PANEL W/O CHOL/HDL RATIO
Cholesterol, Total: 266 mg/dL — ABNORMAL HIGH (ref 100–199)
HDL: 60 mg/dL (ref >39)
LDL Chol Calc (NIH): 195 mg/dL — ABNORMAL HIGH (ref 0–99)
Triglycerides: 70 mg/dL (ref 0–149)
VLDL Cholesterol Cal: 11 mg/dL (ref 5–40)

## 2022-04-16 LAB — CBC WITH DIFFERENTIAL/PLATELET
Basophils Absolute: 0.1 10*3/uL (ref 0.0–0.2)
Basos: 1 %
EOS (ABSOLUTE): 0.1 10*3/uL (ref 0.0–0.4)
Eos: 1 %
Hematocrit: 43.2 % (ref 34.0–46.6)
Hemoglobin: 14.5 g/dL (ref 11.1–15.9)
Immature Grans (Abs): 0 10*3/uL (ref 0.0–0.1)
Immature Granulocytes: 0 %
Lymphocytes Absolute: 3.1 10*3/uL (ref 0.7–3.1)
Lymphs: 37 %
MCH: 29.8 pg (ref 26.6–33.0)
MCHC: 33.6 g/dL (ref 31.5–35.7)
MCV: 89 fL (ref 79–97)
Monocytes Absolute: 0.5 10*3/uL (ref 0.1–0.9)
Monocytes: 6 %
Neutrophils Absolute: 4.6 10*3/uL (ref 1.4–7.0)
Neutrophils: 55 %
Platelets: 258 10*3/uL (ref 150–450)
RBC: 4.86 x10E6/uL (ref 3.77–5.28)
RDW: 11.9 % (ref 11.7–15.4)
WBC: 8.3 10*3/uL (ref 3.4–10.8)

## 2022-04-16 LAB — COMPREHENSIVE METABOLIC PANEL
ALT: 29 IU/L (ref 0–32)
AST: 26 IU/L (ref 0–40)
Albumin/Globulin Ratio: 1.8 (ref 1.2–2.2)
Albumin: 4.6 g/dL (ref 3.9–4.9)
Alkaline Phosphatase: 84 IU/L (ref 44–121)
BUN/Creatinine Ratio: 10 — ABNORMAL LOW (ref 12–28)
BUN: 11 mg/dL (ref 8–27)
Bilirubin Total: 0.8 mg/dL (ref 0.0–1.2)
CO2: 23 mmol/L (ref 20–29)
Calcium: 9.5 mg/dL (ref 8.7–10.3)
Chloride: 102 mmol/L (ref 96–106)
Creatinine, Ser: 1.05 mg/dL — ABNORMAL HIGH (ref 0.57–1.00)
Globulin, Total: 2.6 g/dL (ref 1.5–4.5)
Glucose: 103 mg/dL — ABNORMAL HIGH (ref 70–99)
Potassium: 4.2 mmol/L (ref 3.5–5.2)
Sodium: 141 mmol/L (ref 134–144)
Total Protein: 7.2 g/dL (ref 6.0–8.5)
eGFR: 59 mL/min/{1.73_m2} — ABNORMAL LOW (ref >59)

## 2022-04-16 LAB — TSH: TSH: 1.76 u[IU]/mL (ref 0.450–4.500)

## 2022-05-05 ENCOUNTER — Ambulatory Visit: Payer: BC Managed Care – PPO | Admitting: Family Medicine

## 2022-05-05 ENCOUNTER — Encounter: Payer: Self-pay | Admitting: Family Medicine

## 2022-05-05 VITALS — BP 137/79 | HR 87 | Temp 98.0°F | Ht 64.75 in | Wt 205.8 lb

## 2022-05-05 DIAGNOSIS — M7122 Synovial cyst of popliteal space [Baker], left knee: Secondary | ICD-10-CM | POA: Diagnosis not present

## 2022-05-05 DIAGNOSIS — M6740 Ganglion, unspecified site: Secondary | ICD-10-CM

## 2022-05-05 DIAGNOSIS — F331 Major depressive disorder, recurrent, moderate: Secondary | ICD-10-CM

## 2022-05-05 NOTE — Progress Notes (Addendum)
BP 137/79   Pulse 87   Temp 98 F (36.7 C) (Oral)   Ht 5' 4.75" (1.645 m)   Wt 205 lb 12.8 oz (93.4 kg)   LMP  (LMP Unknown)   SpO2 98%   BMI 34.51 kg/m    Subjective:    Patient ID: Tracie Sheppard, female    DOB: March 28, 1956, 67 y.o.   MRN: [id number]  HPI: AZZURE Sheppard is a 67 y.o. female  Chief Complaint  Patient presents with   Cyst    Patient says the knots have been there for a while and says she assumed they were arthritis due to the family history. Patient says the area on her finger is hard and has changed in appearance. Patient says she noticed a knot behind her L knee and it has been there for a few weeks.    LUMP Duration: chronic Location: R index finger Onset: gradual Painful: yes Discomfort: yes Status:  bigger Trauma: no Redness: no Bruising: no Recent infection: no Swollen lymph nodes: no Requesting removal: yes History of cancer: no Family history of cancer: no History of the same: no  LUMP Duration: weeks Location: behind L knee Onset: gradual Painful: no Discomfort: no Status:  bigger Trauma: no Redness: no Bruising: no Recent infection: no Swollen lymph nodes: no Requesting removal: yes History of cancer: no Family history of cancer: no History of the same: no   Relevant past medical, surgical, family and social history reviewed and updated as indicated. Interim medical history since our last visit reviewed. Allergies and medications reviewed and updated.  Review of Systems  Constitutional: Negative.   Respiratory: Negative.    Cardiovascular: Negative.   Musculoskeletal:  Positive for arthralgias and myalgias. Negative for back pain, gait problem, joint swelling, neck pain and neck stiffness.  Skin: Negative.   Neurological:  Positive for numbness. Negative for dizziness, tremors, seizures, syncope, facial asymmetry, speech difficulty, weakness, light-headedness and headaches.  Psychiatric/Behavioral: Negative.      Per  HPI unless specifically indicated above     Objective:    BP 137/79   Pulse 87   Temp 98 F (36.7 C) (Oral)   Ht 5' 4.75" (1.645 m)   Wt 205 lb 12.8 oz (93.4 kg)   LMP  (LMP Unknown)   SpO2 98%   BMI 34.51 kg/m   Wt Readings from Last 3 Encounters:  05/05/22 205 lb 12.8 oz (93.4 kg)  04/15/22 202 lb 14.4 oz (92 kg)  03/30/22 202 lb 4.8 oz (91.8 kg)    Physical Exam Vitals and nursing note reviewed.  Constitutional:      General: She is not in acute distress.    Appearance: Normal appearance. She is well-developed.  HENT:     Head: Normocephalic and atraumatic.     Right Ear: Hearing and external ear normal.     Left Ear: Hearing and external ear normal.     Nose: Nose normal.     Mouth/Throat:     Mouth: Mucous membranes are moist.     Pharynx: Oropharynx is clear.  Eyes:     General: Lids are normal. No scleral icterus.       Right eye: No discharge.        Left eye: No discharge.     Conjunctiva/sclera: Conjunctivae normal.  Pulmonary:     Effort: Pulmonary effort is normal. No respiratory distress.  Musculoskeletal:        General: Normal range of motion.  Comments: Mucoid cyst on R index finger Hard lump in L popliteal space  Skin:    Coloration: Skin is not jaundiced or pale.     Findings: No bruising, erythema, lesion or rash.  Neurological:     Mental Status: She is alert. Mental status is at baseline. She is disoriented.  Psychiatric:        Mood and Affect: Mood normal.        Speech: Speech normal.        Behavior: Behavior normal.        Thought Content: Thought content normal.        Judgment: Judgment normal.     Results for orders placed or performed in visit on 04/15/22  Microscopic Examination   Urine  Result Value Ref Range   WBC, UA 0-5 0 - 5 /hpf   RBC, Urine 3-10 (A) 0 - 2 /hpf   Epithelial Cells (non renal) 0-10 0 - 10 /hpf   Casts Present (A) None seen /lpf   Cast Type Hyaline casts N/A   Mucus, UA Present (A) Not Estab.    Bacteria, UA None seen None seen/Few  CBC with Differential/Platelet  Result Value Ref Range   WBC 8.3 3.4 - 10.8 x10E3/uL   RBC 4.86 3.77 - 5.28 x10E6/uL   Hemoglobin 14.5 11.1 - 15.9 g/dL   Hematocrit 43.2 34.0 - 46.6 %   MCV 89 79 - 97 fL   MCH 29.8 26.6 - 33.0 pg   MCHC 33.6 31.5 - 35.7 g/dL   RDW 11.9 11.7 - 15.4 %   Platelets 258 150 - 450 x10E3/uL   Neutrophils 55 Not Estab. %   Lymphs 37 Not Estab. %   Monocytes 6 Not Estab. %   Eos 1 Not Estab. %   Basos 1 Not Estab. %   Neutrophils Absolute 4.6 1.4 - 7.0 x10E3/uL   Lymphocytes Absolute 3.1 0.7 - 3.1 x10E3/uL   Monocytes Absolute 0.5 0.1 - 0.9 x10E3/uL   EOS (ABSOLUTE) 0.1 0.0 - 0.4 x10E3/uL   Basophils Absolute 0.1 0.0 - 0.2 x10E3/uL   Immature Granulocytes 0 Not Estab. %   Immature Grans (Abs) 0.0 0.0 - 0.1 x10E3/uL  Comprehensive metabolic panel  Result Value Ref Range   Glucose 103 (H) 70 - 99 mg/dL   BUN 11 8 - 27 mg/dL   Creatinine, Ser 1.05 (H) 0.57 - 1.00 mg/dL   eGFR 59 (L) >59 mL/min/1.73   BUN/Creatinine Ratio 10 (L) 12 - 28   Sodium 141 134 - 144 mmol/L   Potassium 4.2 3.5 - 5.2 mmol/L   Chloride 102 96 - 106 mmol/L   CO2 23 20 - 29 mmol/L   Calcium 9.5 8.7 - 10.3 mg/dL   Total Protein 7.2 6.0 - 8.5 g/dL   Albumin 4.6 3.9 - 4.9 g/dL   Globulin, Total 2.6 1.5 - 4.5 g/dL   Albumin/Globulin Ratio 1.8 1.2 - 2.2   Bilirubin Total 0.8 0.0 - 1.2 mg/dL   Alkaline Phosphatase 84 44 - 121 IU/L   AST 26 0 - 40 IU/L   ALT 29 0 - 32 IU/L  Lipid Panel w/o Chol/HDL Ratio  Result Value Ref Range   Cholesterol, Total 266 (H) 100 - 199 mg/dL   Triglycerides 70 0 - 149 mg/dL   HDL 60 >39 mg/dL   VLDL Cholesterol Cal 11 5 - 40 mg/dL   LDL Chol Calc (NIH) 195 (H) 0 - 99 mg/dL   Lipid Comment: Comment  Urinalysis, Routine w reflex microscopic  Result Value Ref Range   Specific Gravity, UA >1.030 (H) 1.005 - 1.030   pH, UA 5.5 5.0 - 7.5   Color, UA Yellow Yellow   Appearance Ur Clear Clear   Leukocytes,UA  Negative Negative   Protein,UA Trace (A) Negative/Trace   Glucose, UA Negative Negative   Ketones, UA Negative Negative   RBC, UA Trace (A) Negative   Bilirubin, UA Negative Negative   Urobilinogen, Ur 0.2 0.2 - 1.0 mg/dL   Nitrite, UA Negative Negative   Microscopic Examination See below:   TSH  Result Value Ref Range   TSH 1.760 0.450 - 4.500 uIU/mL  Bayer DCA Hb A1c Waived  Result Value Ref Range   HB A1C (BAYER DCA - WAIVED) 5.7 (H) 4.8 - 5.6 %  Microalbumin, Urine Waived  Result Value Ref Range   Microalb, Ur Waived 80 (H) 0 - 19 mg/L   Creatinine, Urine Waived 300 10 - 300 mg/dL   Microalb/Creat Ratio 30-300 (H) <30 mg/g      Assessment & Plan:   Problem List Items Addressed This Visit       Other   Depression, major, recurrent, moderate (HCC)    Not doing great. Would like to speak to a counselor, but has not been able to get in contact with one. Referral to CCM placed today to see about helping with this.       Relevant Orders   AMB Referral to [facility] ([facility])   Other Visit Diagnoses     Mucoid cyst of joint    -  Primary   Will refer to orthopedics. Encouraged voltaren. Call with any concerns. Continue to monitor.   Relevant Orders   Ambulatory referral to Orthopedic Surgery   [place] LT LOWER EXTREM LTD SOFT TISSUE NON VASCULAR   Synovial cyst of left popliteal space       Will refer to orthopedics and check [place] to confirm baker's cyst. Encouraged voltaren. Call with any concerns. Continue to monitor.   Relevant Orders   [place] LT LOWER EXTREM LTD SOFT TISSUE NON VASCULAR        Follow up plan: Return if symptoms worsen or fail to improve.

## 2022-05-05 NOTE — Patient Instructions (Signed)
Myxoid Cyst A myxoid cyst, or digital mucous cyst, is a small, noncancerous bump most often found near a joint at the end of your finger. They're frequently related to underlying health conditions such as osteoarthritis. Myxoid cysts usually don't require treatment. However, if you do seek treatment, surgical removal of the cyst is the most effective method.  Contents Overview Symptoms and Causes Diagnosis and Tests Management and Treatment Outlook / Prognosis Overview What is a myxoid cyst? A myxoid cyst is a small, shiny lump or growth most often found near a nail on your finger or toe. Other names for the condition are digital mucous cyst and digital myxoid cyst. You may hear your healthcare provider refer to it as a pseudocyst. This is because a true cyst has a capsule surrounding it, but a myxoid cyst doesn't.  Digital mucous cysts are a type of ganglion cyst. A ganglion is a noncancerous (benign) soft tissue tumor that develops near a joint or tendon. Digital myxoid cysts are often ganglions that connect to the lining of your finger or toe joint. They show up between the joint and the nail.  Myxoid cysts are commonly associated with underlying health conditions such as osteoarthritis. They're usually painless and don't require treatment. However, you may want to seek treatment if they bother you or cause pain.  Who do myxoid cysts affect? Myxoid cysts can affect anyone, but they're most common in adults ages 4 and up. They affect people assigned female at birth three times as often as people assigned female at birth. Between 64% and 93% of people with osteoarthritis have myxoid cysts.  Symptoms and Causes What are the symptoms of myxoid cysts? Myxoid cysts are most common near the nail on the index, middle or ring finger of your preferred (dominant) hand. They're not as common on your toes. You'll usually only have one cyst, but you may develop more than one. They develop under your skin  and attach to the joint by a stalk.  Myxoid cysts have a smooth, shiny surface. They're usually skin-colored and look almost translucent (light can shine through). They range in size from 5 millimeters to 1 centimeter across, which is about the size of a pencil eraser. The round or oval bumps may be firm or filled with a jelly-like, sticky fluid. They typically don't cause any pain. However, you may experience arthritis pain and stiffness in the nearby joint.  Rarely, a myxoid cyst may grow under your nail or involve the root of the nail. This type of cyst can be painful. It can cause a groove to develop down the length of your nail. You may also see slight depressions or color changes. Your nail may split or you may lose your nail.  What causes a myxoid cyst? Researchers aren't exactly sure what causes myxoid cysts. They appear to form when connective tissue weakens (degenerates). There are two variations:  Osteoarthritis: Osteoarthritis and other degenerative diseases can cause the lining of your joints to grow excessively. This type of cyst looks like a ganglion cyst, which has a stalk that tracks back to the joint. Focal mucinosis: This condition occurs when abnormal deposits of mucins (mucopolysaccharides) build up in the skin under your finger or toe. Mucins are part of your mucus. This type of cyst isn't associated with joint degeneration. Diagnosis and Tests How is a myxoid cyst diagnosed? Your healthcare provider may be able to diagnose a myxoid cyst with a physical exam. They'll look at and feel the cyst. They'll  ask you questions about the cyst, including:  How fast has it been growing? Does it cause you any pain? Have you noticed any color changes? Have you had any recent trauma to the area? Your provider may request imaging tests or a biopsy to confirm a diagnosis, especially if the cyst is located under your nail. An X-ray can show evidence of osteoarthritis. A biopsy can rule out  other medical conditions.  Management and Treatment How do you get rid of a myxoid cyst? Myxoid cysts may shrink or go away on their own. However, if they don't, they're usually not painful and don't cause any other symptoms. Even when a cyst does disappear, it often comes back.  There aren't many digital mucous cyst home treatment options. You can try firmly pressing on the cyst repeatedly for several weeks. Some studies say 39% of cysts may disappear using this method. But you shouldn't try to drain or puncture the cyst yourself. This can cause an infection. Some people have tried applying a topical steroid to their cysts, but there's no strong evidence that this method is effective.  When should I see my healthcare provider about a myxoid cyst? You should have a myxoid cyst checked out by your provider if it's causing you pain or affecting your quality of life. You should also see your provider if the cyst looks infected. Your provider will know how to treat a myxoid cyst on your finger or toe. They will suggest the best treatment option for you. If treatment is required, there are many options available.  How are myxoid cysts treated? Surgical removal is the most effective treatment for a myxoid cyst, with a higher than 90% success rate. During surgery, your healthcare provider will cut the cyst away. They may also inject a dye into the joint to find and seal the point of fluid leakage. Your provider may scrape the involved joint and remove any bony outgrowths from the joint cartilage (osteophytes). Then, they'll cover the area with a skin flap that closes as it heals.  There are many nonsurgical treatment options as well. Nonsurgical treatment options may include:  Repeated sterile draining: Your provider will use a sterile needle or knife blade to puncture and drain the cyst. They may need to repeat the procedure two to five times. Steroid injection: Your provider will drain the cyst and then  inject a steroid or other chemical (sclerosing agent) to shrink the fluid in the cyst. Cryotherapy: Your provider will first drain the cyst. Then, they'll use liquid nitrogen to freeze and then thaw the cyst. Liquid nitrogen blocks more fluid from reaching the cyst. Carbon dioxide ablative laser: Your provider will drain the cyst and then use a laser to burn off (ablate) the cyst base. Infrared coagulation: Your provider will use infrared radiation which will convert into heat to burn off the cyst base. Intralesional photodynamic therapy: Your provider will drain the cyst and injects a substance into it that makes it light-sensitive. Then, they'll use a laser light to burn off the cyst base. Outlook / Prognosis What can I expect if I have a myxoid cyst? Most myxoid cysts don't cause any pain or other symptoms. They may go away on their own but they may not. If a cyst bothers you, talk to your healthcare provider about treatment options. It's important to note that some treatment options can leave scars, cause pain or swelling, or decrease your joint range of motion.  In addition, myxoid cysts have a tendency  to return after treatment. Surgical removal tends to have the lowest recurrence rate at only 2% as long as your surgeon removes the stalk along with the cyst. Intralesional photodynamic therapy also has a very high success rate. In one small study, 100% of people who used this method saw no cyst recurrence after 18 months.  Other recurrence rates range from 14% to 70% depending on the method of treatment.  A note from [facility]  Myxoid cysts are small, noncancerous bumps that are usually associated with osteoarthritis. They're typically painless and may go away on their own. You should speak to your healthcare provider about treatment if they bother you, cause pain or look infected. Your provider can discuss various surgical and nonsurgical treatment options. But it's important to remember  that treatment options often come with side effects such as pain and scarring. In addition, myxoid cysts tend to recur after treatment.

## 2022-05-05 NOTE — Addendum Note (Signed)
Addended by: Valerie Roys on: 05/05/2022 02:12 PM   Modules accepted: Orders

## 2022-05-05 NOTE — Assessment & Plan Note (Signed)
Not doing great. Would like to speak to a counselor, but has not been able to get in contact with one. Referral to CCM placed today to see about helping with this.

## 2022-05-09 ENCOUNTER — Ambulatory Visit
Admission: RE | Admit: 2022-05-09 | Discharge: 2022-05-09 | Disposition: A | Discharge status: Home / Self Care | Payer: BC Managed Care – PPO | Source: Ambulatory Visit | Attending: Family Medicine | Admitting: Family Medicine

## 2022-05-09 DIAGNOSIS — M7122 Synovial cyst of popliteal space [Baker], left knee: Secondary | ICD-10-CM | POA: Diagnosis present

## 2022-05-09 DIAGNOSIS — M6740 Ganglion, unspecified site: Secondary | ICD-10-CM

## 2022-05-10 ENCOUNTER — Telehealth: Payer: Self-pay | Admitting: *Deleted

## 2022-05-10 NOTE — Telephone Encounter (Signed)
Spoke to patient and schedule her for OV on Thursday.

## 2022-05-11 ENCOUNTER — Ambulatory Visit: Payer: BC Managed Care – PPO | Admitting: Physician Assistant

## 2022-05-11 VITALS — BP 106/67 | HR 85 | Ht 65.0 in | Wt 208.0 lb

## 2022-05-11 DIAGNOSIS — R300 Dysuria: Secondary | ICD-10-CM | POA: Diagnosis not present

## 2022-05-11 DIAGNOSIS — M545 Low back pain, unspecified: Secondary | ICD-10-CM

## 2022-05-11 DIAGNOSIS — N390 Urinary tract infection, site not specified: Secondary | ICD-10-CM

## 2022-05-11 DIAGNOSIS — R1030 Lower abdominal pain, unspecified: Secondary | ICD-10-CM

## 2022-05-11 DIAGNOSIS — Z87440 Personal history of urinary (tract) infections: Secondary | ICD-10-CM

## 2022-05-11 DIAGNOSIS — R350 Frequency of micturition: Secondary | ICD-10-CM

## 2022-05-11 LAB — URINALYSIS, COMPLETE
Bilirubin, UA: NEGATIVE
Ketones, UA: NEGATIVE
Nitrite, UA: POSITIVE — AB
Specific Gravity, UA: 1.02 (ref 1.005–1.030)
Urobilinogen, Ur: 1 mg/dL (ref 0.2–1.0)
pH, UA: 6 (ref 5.0–7.5)

## 2022-05-11 LAB — MICROSCOPIC EXAMINATION: WBC, UA: >30 /hpf — AB (ref 0–5)

## 2022-05-11 MED ORDER — CIPROFLOXACIN HCL 250 MG PO TABS: 250.0000 mg | ORAL_TABLET | Freq: Two times a day (BID) | ORAL | 0 refills | Status: AC

## 2022-05-11 NOTE — Patient Outreach (Signed)
  Care Coordination   Initial Visit Note   05/11/2022 Name: GAELYN TUKES MRN: [id number] DOB: 1955-05-09 Late entry EMMALYNNE COURTNEY is a 67 y.o. year old female who sees Valerie Roys, DO for primary care. I spoke with  [name] by phone on 05/10/22.  What matters to the patients health and wellness today?  Mental health Resources    Goals Addressed             This Visit's Progress    "Mental Health Resources"       Care Coordination Interventions: Consult received to provide patient with Mental Health Resources to address symptoms of anxiety and depression Confirmed that medication is managed by PCP, she has been in mental health treatment in the past, however not in treatment currenlty Patient denies any current thoughts of harm to self or others and is open to ongoing follow up with a mental health provider Benefits of mental health therapy discussed, following resources provided Mental Health Resources Provided: [phone] [facility] [place] [phone] [facility] [phone] [facility] [phone] Patient agreeable to contacting one of the above agencies and scheduling a consultation PHQ2/PHQ9 completed Solution-Focused Strategies employed:  Active listening / Reflection utilized  Emotional Support Provided In the event of a mental health crisis, patient encouraged to contact 988 crisis line         SDOH assessments and interventions completed:  Yes  SDOH Interventions Today    Flowsheet Row Most Recent Value  SDOH Interventions   Food Insecurity Interventions Intervention Not Indicated  Housing Interventions Intervention Not Indicated  Transportation Interventions Intervention Not Indicated  Financial Strain Interventions Intervention Not Indicated        Care Coordination Interventions:  Yes, provided    Interventions Today    Flowsheet Row Most Recent Value  General Interventions   General Interventions Discussed/Reviewed [place] Discussed/Reviewed Mental Health Discussed, Coping Strategies        Follow up plan: No further intervention required.   Encounter Outcome:  Pt. Visit Completed

## 2022-05-11 NOTE — Progress Notes (Signed)
05/11/2022 1:29 PM   Tracie Sheppard [birth year] [id number]  CC: Chief Complaint  Patient presents with   Urinary Urgency   HPI: Tracie Sheppard is a 67 y.o. 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   Today she reports a 2-day history of dysuria, frequency, lower abdominal pain, and low back pain.  She reports chills and nausea responsive to Zofran but denies fever or chills.  She took Azo, which helped somewhat.  In-office UA today positive for trace glucose, 3+ blood, 3+ protein, nitrites, and 1+ leukocytes; urine microscopy with >30 WBCs/HPF, 11-30 RBCs/HPF, and many bacteria.   PMH: Past Medical History:  Diagnosis Date   Allergy    Seasonal   Anxiety    Bilateral carpal tunnel syndrome 05/30/2017   Chronic back pain    Depression, major, recurrent, moderate (HCC)    Diabetes mellitus without complication (HCC)    Prediabetic   High serum testosterone    HTN (hypertension) 09/12/2019   Kidney stone    Sleep apnea    Thyroid disease    did not show up in lab work but per other symptoms previous doctor started her on this    Surgical History: Past Surgical History:  Procedure Laterality Date   CHOLECYSTECTOMY     1997   COLONOSCOPY WITH PROPOFOL N/A 11/16/2018   Procedure: COLONOSCOPY WITH PROPOFOL;  Surgeon: Lin Landsman, MD;  Location: [facility];  Service: Gastroenterology;  Laterality: N/A;   CYSTOSCOPY  09/03/2021   ESOPHAGOGASTRODUODENOSCOPY (EGD) WITH PROPOFOL N/A 11/16/2018   Procedure: ESOPHAGOGASTRODUODENOSCOPY (EGD) WITH PROPOFOL;  Surgeon: Lin Landsman, MD;  Location: [facility];  Service: Gastroenterology;  Laterality: N/A;   NASAL SEPTUM SURGERY     OOPHORECTOMY     TUBAL LIGATION     1989    Home Medications:  Allergies as of 05/11/2022       Reactions   Aleve [naproxen Sodium] Other (See  Comments)   Chest pain   Statins Other (See Comments)   myalgias        Medication List        Accurate as of May 11, 2022  1:29 PM. If you have any questions, ask your nurse or doctor.          acetaminophen 500 MG tablet Commonly known as: TYLENOL Tylenol Extra Strength 500 mg tablet   Alpha-Lipoic Acid 600 MG Caps Take 600 mg by mouth daily.   buPROPion 300 MG 24 hr tablet Commonly known as: WELLBUTRIN XL Take 1 tablet (300 mg total) by mouth daily.   cetirizine 10 MG tablet Commonly known as: ZYRTEC Take 1 tablet (10 mg total) by mouth daily as needed for allergies.   ciprofloxacin 250 MG tablet Commonly known as: CIPRO Take 1 tablet (250 mg total) by mouth 2 (two) times daily for 7 days.   citalopram 40 MG tablet Commonly known as: CELEXA Take 1.5 tablets (60 mg total) by mouth daily.   clonazePAM 0.5 MG tablet Commonly known as: KLONOPIN TAKE 1 TABLET BY MOUTH DAILY AS NEEDED FOR ANIXETY   Cyanocobalamin 1000 MCG Tbcr Take 1,000 mcg by mouth daily.   cyclobenzaprine 10 MG tablet Commonly known as: FLEXERIL Take 0.5-1 tablets (5-10 mg total) by mouth at bedtime.   diclofenac 75 MG EC tablet Commonly known as: VOLTAREN Take 1 tablet (75 mg total) by mouth 2 (two) times daily.  estradiol 0.1 MG/GM vaginal cream Commonly known as: ESTRACE Apply one pea-sized amount around the opening of the urethra daily for 2 weeks, then 3 times weekly moving forward.   fluticasone 50 MCG/ACT nasal spray Commonly known as: FLONASE SPRAY 1 SPRAY INTO EACH NOSTRIL EVERY DAY   propranolol ER 60 MG 24 hr capsule Commonly known as: INDERAL LA Take 1 tablet by mouth daily.   spironolactone 100 MG tablet Commonly known as: ALDACTONE Take 1 tablet (100 mg total) by mouth 2 (two) times daily.   VITAMIN D PO Take by mouth daily. 1000 mcg        Allergies:  Allergies  Allergen Reactions   Aleve [Naproxen Sodium] Other (See Comments)    Chest pain    Statins Other (See Comments)    myalgias    Family History: Family History  Problem Relation Age of Onset   Diabetes Mother    Anemia Mother    Hyperlipidemia Mother    Arthritis Mother    Hypertension Father    Cancer Father    Celiac disease Sister    Diabetes Maternal Grandmother    Stroke Maternal Grandmother    Heart attack Maternal Grandmother    Heart disease Paternal Grandfather     Social History:   reports that she has never smoked. She has never used smokeless tobacco. She reports current alcohol use. She reports that she does not use drugs.  Physical Exam: BP 106/67   Pulse 85   Ht '5\' 5"'$  (1.651 m)   Wt 208 lb (94.3 kg)   LMP  (LMP Unknown)   BMI 34.61 kg/m   Constitutional:  Alert and oriented, no acute distress, nontoxic appearing HEENT: [place], AT Cardiovascular: No clubbing, cyanosis, or edema Respiratory: Normal respiratory effort, no increased work of breathing GU: No CVA tenderness Skin: No rashes, bruises or suspicious lesions Neurologic: Grossly intact, no focal deficits, moving all 4 extremities Psychiatric: Normal mood and affect  Laboratory Data: Results for orders placed or performed in visit on 05/11/22  Microscopic Examination   Urine  Result Value Ref Range   WBC, UA >30 (A) 0 - 5 /hpf   RBC, Urine 11-30 (A) 0 - 2 /hpf   Epithelial Cells (non renal) 0-10 0 - 10 /hpf   Bacteria, UA Many (A) None seen/Few  Urinalysis, Complete  Result Value Ref Range   Specific Gravity, UA 1.020 1.005 - 1.030   pH, UA 6.0 5.0 - 7.5   Color, UA Orange Yellow   Appearance Ur Cloudy (A) Clear   Leukocytes,UA 1+ (A) Negative   Protein,UA 3+ (A) Negative/Trace   Glucose, UA Trace (A) Negative   Ketones, UA Negative Negative   RBC, UA 3+ (A) Negative   Bilirubin, UA Negative Negative   Urobilinogen, Ur 1.0 0.2 - 1.0 mg/dL   Nitrite, UA Positive (A) Negative   Microscopic Examination See below:    Assessment & Plan:   1. Recurrent UTI UA appears grossly  infected today, nitrites may be contaminant from Azo use.  Will start empiric Cipro and send for culture for further evaluation.  No CVA tenderness today.  Overall lower concern for acute stone episode, though we discussed return precautions including worsening pain, new fevers, or flank pain. - Urinalysis, Complete - CULTURE, URINE COMPREHENSIVE - ciprofloxacin (CIPRO) 250 MG tablet; Take 1 tablet (250 mg total) by mouth 2 (two) times daily for 7 days.  Dispense: 14 tablet; Refill: 0  Return if symptoms worsen or fail to  improve.  Debroah Loop, PA-C  [facility] [address] [place], [place] [address] [phone]

## 2022-05-11 NOTE — Patient Instructions (Signed)
Visit Information  Thank you for taking time to visit with me today. Please don't hesitate to contact me if I can be of assistance to you.   Following are the goals we discussed today:   Goals Addressed             This Visit's Progress    "Mental Health Resources"       Care Coordination Interventions: Consult received to provide patient with Mental Health Resources to address symptoms of anxiety and depression Confirmed that medication is managed by PCP, she has been in mental health treatment in the past, however not in treatment currenlty Patient denies any current thoughts of harm to self or others and is open to ongoing follow up with a mental health provider Benefits of mental health therapy discussed, following resources provided Mental Health Resources Provided: [facility]-[phone] [facility] [phone] [place] [phone] [facility] [phone] [facility] [phone] Patient agreeable to contacting one of the above agencies and scheduling a consultation PHQ2/PHQ9 completed Solution-Focused Strategies employed:  Active listening / Reflection utilized  Emotional Support Provided In the event of a mental health crisis, patient encouraged to contact 988 crisis line         Please call the care guide team at [phone] if you need to cancel or reschedule your appointment.   If you are experiencing a Mental Health or [place] or need someone to talk to, please call the Suicide and Crisis Lifeline: 988   Patient verbalizes understanding of instructions and care plan provided today and agrees to view in [place]. Active MyChart status and patient understanding of how to access instructions and care plan via MyChart confirmed with patient.     No further follow up required: patient to contact mental health provider using resources  provided  [facility], [place] Worker  [facility] Care Management [phone]

## 2022-05-12 ENCOUNTER — Ambulatory Visit: Payer: BC Managed Care – PPO | Admitting: Physician Assistant

## 2022-05-15 LAB — CULTURE, URINE COMPREHENSIVE

## 2022-05-19 ENCOUNTER — Ambulatory Visit: Payer: BC Managed Care – PPO | Admitting: Physician Assistant

## 2022-05-31 ENCOUNTER — Encounter: Payer: Self-pay | Admitting: Family Medicine

## 2022-05-31 ENCOUNTER — Ambulatory Visit: Payer: BC Managed Care – PPO | Admitting: Family Medicine

## 2022-05-31 VITALS — BP 141/84 | HR 74 | Temp 97.6°F | Ht 65.0 in | Wt 208.7 lb

## 2022-05-31 DIAGNOSIS — M9902 Segmental and somatic dysfunction of thoracic region: Secondary | ICD-10-CM

## 2022-05-31 DIAGNOSIS — M9901 Segmental and somatic dysfunction of cervical region: Secondary | ICD-10-CM | POA: Diagnosis not present

## 2022-05-31 DIAGNOSIS — M545 Low back pain, unspecified: Secondary | ICD-10-CM | POA: Diagnosis not present

## 2022-05-31 DIAGNOSIS — M9909 Segmental and somatic dysfunction of abdomen and other regions: Secondary | ICD-10-CM

## 2022-05-31 DIAGNOSIS — M9908 Segmental and somatic dysfunction of rib cage: Secondary | ICD-10-CM | POA: Diagnosis not present

## 2022-05-31 DIAGNOSIS — M9900 Segmental and somatic dysfunction of head region: Secondary | ICD-10-CM

## 2022-05-31 DIAGNOSIS — M9904 Segmental and somatic dysfunction of sacral region: Secondary | ICD-10-CM

## 2022-05-31 DIAGNOSIS — M9903 Segmental and somatic dysfunction of lumbar region: Secondary | ICD-10-CM

## 2022-05-31 DIAGNOSIS — M9906 Segmental and somatic dysfunction of lower extremity: Secondary | ICD-10-CM

## 2022-05-31 DIAGNOSIS — M9905 Segmental and somatic dysfunction of pelvic region: Secondary | ICD-10-CM | POA: Diagnosis not present

## 2022-05-31 NOTE — Progress Notes (Signed)
BP (!) 141/84 (BP Location: Left Arm, Cuff Size: Normal)   Pulse 74   Temp 97.6 F (36.4 C) (Oral)   Ht '5\' 5"'$  (1.651 m)   Wt 208 lb 11.2 oz (94.7 kg)   LMP  (LMP Unknown)   SpO2 98%   BMI 34.73 kg/m    Subjective:    Patient ID: Tracie Sheppard, female    DOB: 1955-12-23, 67 y.o.   MRN: [id number]  HPI: Tracie Sheppard is a 67 y.o. female  Chief Complaint  Patient presents with   Back Pain   Lakie presents today for evaluation and possible treatment of back pain. She notes that it's aching and sore in nature. Better with rest and worse with driving. Pain radiates down R leg. She denies any significant changes. Continues with her pain in her R ankle. Seeing ortho about the cyst at the back of her leg. They want to do an MRI. She is not sure if she's going to do that at this time. She denies any pain in her knee. She is otherwise doing well with no other concerns or complaints at this time.   Relevant past medical, surgical, family and social history reviewed and updated as indicated. Interim medical history since our last visit reviewed. Allergies and medications reviewed and updated.  Review of Systems  Constitutional: Negative.   Respiratory: Negative.    Cardiovascular: Negative.   Gastrointestinal: Negative.   Musculoskeletal:  Positive for arthralgias, back pain and myalgias. Negative for gait problem, joint swelling, neck pain and neck stiffness.  Skin: Negative.   Neurological: Negative.   Psychiatric/Behavioral: Negative.      Per HPI unless specifically indicated above     Objective:    BP (!) 141/84 (BP Location: Left Arm, Cuff Size: Normal)   Pulse 74   Temp 97.6 F (36.4 C) (Oral)   Ht '5\' 5"'$  (1.651 m)   Wt 208 lb 11.2 oz (94.7 kg)   LMP  (LMP Unknown)   SpO2 98%   BMI 34.73 kg/m   Wt Readings from Last 3 Encounters:  05/31/22 208 lb 11.2 oz (94.7 kg)  05/11/22 208 lb (94.3 kg)  05/05/22 205 lb 12.8 oz (93.4 kg)    Physical Exam Vitals and nursing  note reviewed.  Constitutional:      General: She is not in acute distress.    Appearance: Normal appearance. She is obese. She is not ill-appearing.  HENT:     Head: Normocephalic and atraumatic.     Right Ear: External ear normal.     Left Ear: External ear normal.     Nose: Nose normal.     Mouth/Throat:     Mouth: Mucous membranes are moist.     Pharynx: Oropharynx is clear.  Eyes:     Extraocular Movements: Extraocular movements intact.     Conjunctiva/sclera: Conjunctivae normal.     Pupils: Pupils are equal, round, and reactive to light.  Neck:     Vascular: No carotid bruit.  Cardiovascular:     Rate and Rhythm: Normal rate.     Pulses: Normal pulses.  Pulmonary:     Effort: Pulmonary effort is normal. No respiratory distress.  Abdominal:     General: Abdomen is flat. There is no distension.     Palpations: Abdomen is soft. There is no mass.     Tenderness: There is no abdominal tenderness. There is no right CVA tenderness, left CVA tenderness, guarding or rebound.  Hernia: No hernia is present.  Musculoskeletal:     Cervical back: No muscular tenderness.  Lymphadenopathy:     Cervical: No cervical adenopathy.  Skin:    General: Skin is warm and dry.     Capillary Refill: Capillary refill takes less than 2 seconds.     Coloration: Skin is not jaundiced or pale.     Findings: No bruising, erythema, lesion or rash.  Neurological:     General: No focal deficit present.     Mental Status: She is alert. Mental status is at baseline.  Psychiatric:        Mood and Affect: Mood normal.        Behavior: Behavior normal.        Thought Content: Thought content normal.        Judgment: Judgment normal.   Musculoskeletal:  Exam found Decreased ROM, Tissue texture changes, Tenderness to palpation, and Asymmetry of patient's  head, neck, thorax, ribs, lumbar, pelvis, sacrum, lower extremity, and abdomen Osteopathic Structural Exam:   Head: hypertonic suboccipital muscles  OAESSR  Neck: C4ESRR, SCM hypertonic on the R  Thorax: T4-6 SLRR  Ribs:  Ribs 6-7 locked up on the L, rib 6 locked up on the R  Lumbar: QL hypertonic on the R   Pelvis: Anterior R innominate   Sacrum: R on R torsion  Lower Extremity: IT band hypertonic on the R  Abdomen: diaphragm hypertonic bilaterally R>L  Results for orders placed or performed in visit on 05/11/22  CULTURE, URINE COMPREHENSIVE   Specimen: Urine   UR  Result Value Ref Range   Urine Culture, Comprehensive Final report (A)    Organism ID, Bacteria Proteus mirabilis (A)    ANTIMICROBIAL SUSCEPTIBILITY Comment   Microscopic Examination   Urine  Result Value Ref Range   WBC, UA >30 (A) 0 - 5 /hpf   RBC, Urine 11-30 (A) 0 - 2 /hpf   Epithelial Cells (non renal) 0-10 0 - 10 /hpf   Bacteria, UA Many (A) None seen/Few  Urinalysis, Complete  Result Value Ref Range   Specific Gravity, UA 1.020 1.005 - 1.030   pH, UA 6.0 5.0 - 7.5   Color, UA Orange Yellow   Appearance Ur Cloudy (A) Clear   Leukocytes,UA 1+ (A) Negative   Protein,UA 3+ (A) Negative/Trace   Glucose, UA Trace (A) Negative   Ketones, UA Negative Negative   RBC, UA 3+ (A) Negative   Bilirubin, UA Negative Negative   Urobilinogen, Ur 1.0 0.2 - 1.0 mg/dL   Nitrite, UA Positive (A) Negative   Microscopic Examination See below:       Assessment & Plan:   Problem List Items Addressed This Visit   None Visit Diagnoses     Acute right-sided low back pain without sciatica    -  Primary   In acute exacerbation. She does have somatic dysfunction that is contributing to her symptoms. Treated today with good results as below.   Head region somatic dysfunction       Cervical segment dysfunction       Thoracic segment dysfunction       Somatic dysfunction of lumbar region       Somatic dysfunction of sacral region       Somatic dysfunction of pelvis region       Rib cage region somatic dysfunction       Segmental dysfunction of abdomen       Somatic  dysfunction of lower extremities  After verbal consent was obtained, patient was treated today with osteopathic manipulative medicine to the regions of the head, neck, thorax, ribs, lumbar, pelvis, sacrum, abdomen, and lower extremity using the techniques of cranial, myofascial release, counterstrain, muscle energy, HVLA, and soft tissue. Areas of compensation relating to her primary pain source also treated. Patient tolerated the procedure well with good objective and good subjective improvement in symptoms. She left the room in good condition. She was advised to stay well hydrated and that she may have some soreness following the procedure. If not improving or worsening, she will call and come in. She will return for reevaluation  on a PRN basis.   Follow up plan: Return if symptoms worsen or fail to improve.

## 2022-06-05 ENCOUNTER — Encounter: Payer: Self-pay | Admitting: Family Medicine

## 2022-06-30 ENCOUNTER — Ambulatory Visit: Payer: BC Managed Care – PPO | Admitting: Family Medicine

## 2022-06-30 ENCOUNTER — Encounter: Payer: Self-pay | Admitting: Family Medicine

## 2022-06-30 VITALS — BP 146/81 | HR 81 | Temp 97.9°F | Wt 208.9 lb

## 2022-06-30 DIAGNOSIS — M9901 Segmental and somatic dysfunction of cervical region: Secondary | ICD-10-CM

## 2022-06-30 DIAGNOSIS — M9906 Segmental and somatic dysfunction of lower extremity: Secondary | ICD-10-CM | POA: Diagnosis not present

## 2022-06-30 DIAGNOSIS — M9909 Segmental and somatic dysfunction of abdomen and other regions: Secondary | ICD-10-CM

## 2022-06-30 DIAGNOSIS — I10 Essential (primary) hypertension: Secondary | ICD-10-CM

## 2022-06-30 DIAGNOSIS — M9904 Segmental and somatic dysfunction of sacral region: Secondary | ICD-10-CM

## 2022-06-30 DIAGNOSIS — M9900 Segmental and somatic dysfunction of head region: Secondary | ICD-10-CM

## 2022-06-30 DIAGNOSIS — R7301 Impaired fasting glucose: Secondary | ICD-10-CM | POA: Diagnosis not present

## 2022-06-30 DIAGNOSIS — M9905 Segmental and somatic dysfunction of pelvic region: Secondary | ICD-10-CM

## 2022-06-30 DIAGNOSIS — M9908 Segmental and somatic dysfunction of rib cage: Secondary | ICD-10-CM

## 2022-06-30 DIAGNOSIS — M549 Dorsalgia, unspecified: Secondary | ICD-10-CM | POA: Diagnosis not present

## 2022-06-30 DIAGNOSIS — M545 Low back pain, unspecified: Secondary | ICD-10-CM | POA: Diagnosis not present

## 2022-06-30 DIAGNOSIS — M9903 Segmental and somatic dysfunction of lumbar region: Secondary | ICD-10-CM

## 2022-06-30 DIAGNOSIS — M9902 Segmental and somatic dysfunction of thoracic region: Secondary | ICD-10-CM

## 2022-06-30 LAB — BAYER DCA HB A1C WAIVED: HB A1C (BAYER DCA - WAIVED): 5.8 % — ABNORMAL HIGH (ref 4.8–5.6)

## 2022-06-30 NOTE — Assessment & Plan Note (Signed)
Did not take her medicine today. Encouraged her to take it daily.

## 2022-06-30 NOTE — Assessment & Plan Note (Signed)
Rechecking labs today. Await results. Treat as needed.  °

## 2022-06-30 NOTE — Progress Notes (Signed)
BP (!) 146/81 (BP Location: Left Arm, Cuff Size: Normal)   Pulse 81   Temp 97.9 F (36.6 C) (Oral)   Wt 208 lb 14.4 oz (94.8 kg)   LMP  (LMP Unknown)   SpO2 97%   BMI 34.76 kg/m    Subjective:    Patient ID: Tracie Sheppard, female    DOB: 1955-10-08, 66 y.o.   MRN: [id number]  HPI: Tracie Sheppard is a 67 y.o. female  Chief Complaint  Patient presents with   Back Pain   Tracie Sheppard presents today for evaluation and possible treatment with OMT of low back and L shoulder. She notes that she has been hurting behind her L knee- she has not had her MRI yet. She notes that her knee is uncomfortable. Most of her pain is in her R side of neck and shoulder and in her low back. Pain is aching and sore in nature. Better with OMT. Worse with moving around with her knee. Pain does not radiate. She is otherwise doing well with no other concerns or complaints at this time.    HYPERTENSION- did not take her medicine the past couple of days Hypertension status: uncontrolled  Satisfied with current treatment? yes Duration of hypertension: chronic BP monitoring frequency:  not checking BP medication side effects:  no Medication compliance: fair compliance Previous BP meds:spironolactone Aspirin: no Recurrent headaches: no Visual changes: no Palpitations: no Dyspnea: no Chest pain: no Lower extremity edema: no Dizzy/lightheaded: no  Relevant past medical, surgical, family and social history reviewed and updated as indicated. Interim medical history since our last visit reviewed. Allergies and medications reviewed and updated.  Review of Systems  Constitutional: Negative.   Respiratory: Negative.    Cardiovascular: Negative.   Musculoskeletal:  Positive for back pain, myalgias, neck pain and neck stiffness. Negative for arthralgias, gait problem and joint swelling.  Skin: Negative.   Neurological: Negative.   Psychiatric/Behavioral: Negative.      Per HPI unless specifically indicated  above     Objective:    BP (!) 146/81 (BP Location: Left Arm, Cuff Size: Normal)   Pulse 81   Temp 97.9 F (36.6 C) (Oral)   Wt 208 lb 14.4 oz (94.8 kg)   LMP  (LMP Unknown)   SpO2 97%   BMI 34.76 kg/m   Wt Readings from Last 3 Encounters:  06/30/22 208 lb 14.4 oz (94.8 kg)  05/31/22 208 lb 11.2 oz (94.7 kg)  05/11/22 208 lb (94.3 kg)    Physical Exam Vitals and nursing note reviewed.  Constitutional:      General: She is not in acute distress.    Appearance: Normal appearance. She is obese. She is not ill-appearing.  HENT:     Head: Normocephalic and atraumatic.     Right Ear: External ear normal.     Left Ear: External ear normal.     Nose: Nose normal.     Mouth/Throat:     Mouth: Mucous membranes are moist.     Pharynx: Oropharynx is clear.  Eyes:     Extraocular Movements: Extraocular movements intact.     Conjunctiva/sclera: Conjunctivae normal.     Pupils: Pupils are equal, round, and reactive to light.  Neck:     Vascular: No carotid bruit.  Cardiovascular:     Rate and Rhythm: Normal rate.     Pulses: Normal pulses.  Pulmonary:     Effort: Pulmonary effort is normal. No respiratory distress.  Abdominal:  General: Abdomen is flat. There is no distension.     Palpations: Abdomen is soft. There is no mass.     Tenderness: There is no abdominal tenderness. There is no right CVA tenderness, left CVA tenderness, guarding or rebound.     Hernia: No hernia is present.  Musculoskeletal:     Cervical back: No muscular tenderness.  Lymphadenopathy:     Cervical: No cervical adenopathy.  Skin:    General: Skin is warm and dry.     Capillary Refill: Capillary refill takes less than 2 seconds.     Coloration: Skin is not jaundiced or pale.     Findings: No bruising, erythema, lesion or rash.  Neurological:     General: No focal deficit present.     Mental Status: She is alert. Mental status is at baseline.  Psychiatric:        Mood and Affect: Mood normal.         Behavior: Behavior normal.        Thought Content: Thought content normal.        Judgment: Judgment normal.   Musculoskeletal:  Exam found Decreased ROM, Tissue texture changes, Tenderness to palpation, and Asymmetry of patient's  head, neck, thorax, ribs, lumbar, pelvis, sacrum, lower extremity, and abdomen Osteopathic Structural Exam:   Head: OAESSR, hypertonic suboccipital muscles  Neck: C3ESRR, C4ESRR, C6ESRL  Thorax: T3-6SRRL, hypertonic trap on the R  Ribs: ribs 6-8 locked up on the R, ribs 4-6 locked up on the L  Lumbar: QL hypertonic on the R  Pelvis: anterior R innominate  Sacrum: R on R torsion  Lower Extremity: fascial drag from swelling behind L knee into anterior thigh and into L IT band  Abdomen: diaphragm hypertonic bilaterally R>L   Results for orders placed or performed in visit on 06/30/22  Bayer DCA Hb A1c Waived  Result Value Ref Range   HB A1C (BAYER DCA - WAIVED) 5.8 (H) 4.8 - 5.6 %      Assessment & Plan:   Problem List Items Addressed This Visit       Cardiovascular and Mediastinum   HTN (hypertension)    Did not take her medicine today. Encouraged her to take it daily.        Endocrine   IFG (impaired fasting glucose) - Primary    Rechecking labs today. Await results. Treat as needed.       Relevant Orders   Bayer DCA Hb A1c Waived (Completed)   Other Visit Diagnoses     Acute right-sided low back pain without sciatica       Likely myofacial and due to abnormal gait from her knee. She does have somatic dysfunction that is contributing to her symptoms. Treated w good results as below   Upper back pain on left side       Head region somatic dysfunction       Thoracic segment dysfunction       Somatic dysfunction of lumbar region       Somatic dysfunction of sacral region       Cervical segment dysfunction       Somatic dysfunction of pelvis region       Rib cage region somatic dysfunction       Segmental dysfunction of abdomen        Somatic dysfunction of lower extremities          After verbal consent was obtained, patient was treated today with osteopathic manipulative medicine to the  regions of the head, neck, thorax, ribs, lumbar, pelvis, sacrum, abdomen, and lower extremity using the techniques of cranial, myofascial release, counterstrain, muscle energy, HVLA, and soft tissue. Areas of compensation relating to her primary pain source also treated. Patient tolerated the procedure well with good objective and good subjective improvement in symptoms. She left the room in good condition. She was advised to stay well hydrated and that she may have some soreness following the procedure. If not improving or worsening, she will call and come in. She will return for reevaluation  on a PRN basis.   Follow up plan: Return if symptoms worsen or fail to improve.

## 2022-08-22 ENCOUNTER — Ambulatory Visit: Payer: BC Managed Care – PPO | Admitting: Family Medicine

## 2022-08-22 ENCOUNTER — Ambulatory Visit
Admission: RE | Admit: 2022-08-22 | Discharge: 2022-08-22 | Disposition: A | Discharge status: Home / Self Care | Payer: BC Managed Care – PPO | Source: Ambulatory Visit | Attending: Family Medicine | Admitting: Family Medicine

## 2022-08-22 ENCOUNTER — Ambulatory Visit
Admission: RE | Admit: 2022-08-22 | Discharge: 2022-08-22 | Disposition: A | Discharge status: Home / Self Care | Payer: BC Managed Care – PPO | Attending: Family Medicine | Admitting: Family Medicine

## 2022-08-22 VITALS — BP 140/85 | HR 72 | Temp 97.7°F | Ht 65.0 in | Wt 210.8 lb

## 2022-08-22 DIAGNOSIS — M79604 Pain in right leg: Secondary | ICD-10-CM | POA: Insufficient documentation

## 2022-08-22 MED ORDER — KETOROLAC TROMETHAMINE 60 MG/2ML IM SOLN
60.0000 mg | Freq: Once | INTRAMUSCULAR | Status: AC
  Administered 2022-08-22: 60 mg via INTRAMUSCULAR

## 2022-08-22 MED ORDER — MELOXICAM 15 MG PO TABS: 15.0000 mg | ORAL_TABLET | Freq: Every day | ORAL | 3 refills | Status: DC

## 2022-08-22 NOTE — Progress Notes (Signed)
BP (!) 140/85   Pulse 72   Temp 97.7 F (36.5 C) (Oral)   Ht 5\' 5"  (1.651 m)   Wt 210 lb 12.8 oz (95.6 kg)   LMP  (LMP Unknown)   SpO2 98%   BMI 35.08 kg/m    Subjective:    Patient ID: Tracie Sheppard, female    DOB: [birth year], 67 y.o.   MRN: [id number]  HPI: Tracie Sheppard is a 68 y.o. female  Chief Complaint  Patient presents with   Leg Injury    Patient says this is her second fall in the last two weeks. Patient says the first, she was in the tub and fell and hit her head. Patient says the second fall she was outside and missed a step.    Fell yesterday. Missed one of the steps while walking the dog. She fell forward onto her knees and arms. R side is hurting a lot more than the left. The majority of her pain is in her R knee but also her R foot and ankle. Not noticing any swelling. She has been able to walk on it. She didn't get dizzy- she notes that she just missed the step. She did fall about a week and a half ago in the bathroom but denies any injuries at that time. She is otherwise feeling well with no other concerns or complaints at this time.   Relevant past medical, surgical, family and social history reviewed and updated as indicated. Interim medical history since our last visit reviewed. Allergies and medications reviewed and updated.  Review of Systems  Constitutional: Negative.   Respiratory: Negative.    Cardiovascular: Negative.   Gastrointestinal: Negative.   Musculoskeletal:  Positive for arthralgias, gait problem and myalgias. Negative for back pain, joint swelling, neck pain and neck stiffness.  Skin: Negative.   Psychiatric/Behavioral: Negative.      Per HPI unless specifically indicated above     Objective:    BP (!) 140/85   Pulse 72   Temp 97.7 F (36.5 C) (Oral)   Ht 5\' 5"  (1.651 m)   Wt 210 lb 12.8 oz (95.6 kg)   LMP  (LMP Unknown)   SpO2 98%   BMI 35.08 kg/m   Wt Readings from Last 3 Encounters:  08/22/22 210 lb 12.8 oz (95.6 kg)   06/30/22 208 lb 14.4 oz (94.8 kg)  05/31/22 208 lb 11.2 oz (94.7 kg)    Physical Exam Vitals and nursing note reviewed.  Constitutional:      General: She is not in acute distress.    Appearance: Normal appearance. She is not ill-appearing, toxic-appearing or diaphoretic.  HENT:     Head: Normocephalic and atraumatic.     Right Ear: External ear normal.     Left Ear: External ear normal.     Nose: Nose normal.     Mouth/Throat:     Mouth: Mucous membranes are moist.     Pharynx: Oropharynx is clear.  Eyes:     General: No scleral icterus.       Right eye: No discharge.        Left eye: No discharge.     Extraocular Movements: Extraocular movements intact.     Conjunctiva/sclera: Conjunctivae normal.     Pupils: Pupils are equal, round, and reactive to light.  Cardiovascular:     Rate and Rhythm: Normal rate and regular rhythm.     Pulses: Normal pulses.     Heart sounds: Normal heart  sounds. No murmur heard.    No friction rub. No gallop.  Pulmonary:     Effort: Pulmonary effort is normal. No respiratory distress.     Breath sounds: Normal breath sounds. No stridor. No wheezing, rhonchi or rales.  Chest:     Chest wall: No tenderness.  Musculoskeletal:        General: Tenderness (leteral hamstring attachment on the R and posterior to lateral maleolus on R) present. No swelling, deformity or signs of injury. Normal range of motion.     Cervical back: Normal range of motion and neck supple.     Right lower leg: No edema.     Left lower leg: No edema.  Skin:    General: Skin is warm and dry.     Capillary Refill: Capillary refill takes less than 2 seconds.     Coloration: Skin is not jaundiced or pale.     Findings: No bruising, erythema, lesion or rash.  Neurological:     General: No focal deficit present.     Mental Status: She is alert and oriented to person, place, and time. Mental status is at baseline.  Psychiatric:        Mood and Affect: Mood normal.         Behavior: Behavior normal.        Thought Content: Thought content normal.        Judgment: Judgment normal.     Results for orders placed or performed in visit on 06/30/22  Bayer DCA Hb A1c Waived  Result Value Ref Range   HB A1C (BAYER DCA - WAIVED) 5.8 (H) 4.8 - 5.6 %      Assessment & Plan:   Problem List Items Addressed This Visit   None Visit Diagnoses     Right leg pain    -  Primary   Will send for x-rays and treat with toradol followed by meloxicam and stretches. RICE. Call with any concerns or if not getting better.   Relevant Medications   ketorolac (TORADOL) injection 60 mg (Start on 08/22/2022  2:45 PM)   Other Relevant Orders   DG Knee Complete 4 Views Right   DG Ankle Complete Right   DG Tibia/Fibula Right        Follow up plan: Return if symptoms worsen or fail to improve.

## 2022-08-28 ENCOUNTER — Other Ambulatory Visit: Payer: Self-pay | Admitting: Family Medicine

## 2022-08-28 DIAGNOSIS — M7731 Calcaneal spur, right foot: Secondary | ICD-10-CM

## 2022-10-14 ENCOUNTER — Ambulatory Visit: Payer: BC Managed Care – PPO | Admitting: Family Medicine

## 2022-10-19 ENCOUNTER — Encounter: Payer: Self-pay | Admitting: Family Medicine

## 2022-10-19 ENCOUNTER — Ambulatory Visit: Payer: BC Managed Care – PPO | Admitting: Family Medicine

## 2022-10-19 VITALS — BP 131/80 | HR 65 | Temp 97.7°F | Wt 211.4 lb

## 2022-10-19 DIAGNOSIS — E782 Mixed hyperlipidemia: Secondary | ICD-10-CM | POA: Diagnosis not present

## 2022-10-19 DIAGNOSIS — I10 Essential (primary) hypertension: Secondary | ICD-10-CM

## 2022-10-19 DIAGNOSIS — F331 Major depressive disorder, recurrent, moderate: Secondary | ICD-10-CM

## 2022-10-19 DIAGNOSIS — F419 Anxiety disorder, unspecified: Secondary | ICD-10-CM | POA: Diagnosis not present

## 2022-10-19 DIAGNOSIS — R7301 Impaired fasting glucose: Secondary | ICD-10-CM | POA: Diagnosis not present

## 2022-10-19 LAB — BAYER DCA HB A1C WAIVED: HB A1C (BAYER DCA - WAIVED): 6.1 % — ABNORMAL HIGH (ref 4.8–5.6)

## 2022-10-19 MED ORDER — MELOXICAM 15 MG PO TABS: 15.0000 mg | ORAL_TABLET | Freq: Every day | ORAL | 3 refills | Status: DC

## 2022-10-19 MED ORDER — CLONAZEPAM 0.5 MG PO TABS: ORAL_TABLET | ORAL | 0 refills | Status: DC

## 2022-10-19 MED ORDER — CITALOPRAM HYDROBROMIDE 40 MG PO TABS: 60.0000 mg | ORAL_TABLET | Freq: Every day | ORAL | 1 refills | Status: DC

## 2022-10-19 MED ORDER — PROPRANOLOL HCL ER 60 MG PO CP24: 60.0000 mg | ORAL_CAPSULE | Freq: Every day | ORAL | 1 refills | Status: DC

## 2022-10-19 MED ORDER — SPIRONOLACTONE 100 MG PO TABS: 100.0000 mg | ORAL_TABLET | Freq: Two times a day (BID) | ORAL | 1 refills | Status: DC

## 2022-10-19 MED ORDER — BUPROPION HCL ER (XL) 300 MG PO TB24: 300.0000 mg | ORAL_TABLET | Freq: Every day | ORAL | 1 refills | Status: DC

## 2022-10-19 NOTE — Assessment & Plan Note (Signed)
Up slightly with A1c of 6.1. Continue diet and exercise. Continue to monitor. Call with any concerns.

## 2022-10-19 NOTE — Assessment & Plan Note (Signed)
 Under good control on current regimen. Continue current regimen. Continue to monitor. Call with any concerns. Refills given. Labs drawn today.   

## 2022-10-19 NOTE — Assessment & Plan Note (Signed)
 Under good control on current regimen. Continue current regimen. Continue to monitor. Call with any concerns. Refills given.   

## 2022-10-19 NOTE — Assessment & Plan Note (Signed)
Unable to tolerate statins. Rechecking labs today. Await results.

## 2022-10-19 NOTE — Progress Notes (Signed)
BP 131/80   Pulse 65   Temp 97.7 F (36.5 C) (Oral)   Wt 211 lb 6.4 oz (95.9 kg)   LMP  (LMP Unknown)   SpO2 96%   BMI 35.18 kg/m    Subjective:    Patient ID: Tracie Sheppard, female    DOB: 1956/03/26, 67 y.o.   MRN: [id number]  HPI: Tracie Sheppard is a 67 y.o. female  Chief Complaint  Patient presents with   Hyperlipidemia   Hypertension   IFG   Impaired Fasting Glucose HbA1C:  Lab Results  Component Value Date   HGBA1C 5.8 (H) 06/30/2022   Duration of elevated blood sugar: chronic Polydipsia: no Polyuria: no Weight change: no Visual disturbance: no Glucose Monitoring: no Diabetic Education: Not Completed Family history of diabetes: yes  HYPERTENSION / HYPERLIPIDEMIA Satisfied with current treatment? yes Duration of hypertension: chronic BP monitoring frequency: not checking BP medication side effects: no Past BP meds: spironolactone, propranolol Duration of hyperlipidemia: chronic Cholesterol medication side effects: yes Cholesterol supplements: none Past cholesterol medications: none Medication compliance: excellent compliance Aspirin: no Recent stressors: no Recurrent headaches: no Visual changes: no Palpitations: no Dyspnea: no Chest pain: no Lower extremity edema: no Dizzy/lightheaded: no  ANXIETY/DEPRESSION Duration: chronic Status:controlled Anxious mood: yes  Excessive worrying: yes Irritability: no  Sweating: no Nausea: no Palpitations:no Hyperventilation: no Panic attacks: no Agoraphobia: no  Obscessions/compulsions: no Depressed mood: no    10/19/2022   10:35 AM 08/22/2022    2:16 PM 05/10/2022   12:22 PM 05/10/2022   12:17 PM 05/05/2022    1:41 PM  Depression screen PHQ 2/9  Decreased Interest 0 1  1 2   Down, Depressed, Hopeless  1  1 1   PHQ - 2 Score 0 2  2 3   Altered sleeping 3 3 2  3   Tired, decreased energy 1 2 2  3   Change in appetite 3 3 3  3   Feeling bad or failure about yourself  0 0 1  1  Trouble concentrating 1  0 0  1  Moving slowly or fidgety/restless 0 0 0  0  Suicidal thoughts 0 0   0  PHQ-9 Score 8 10   14   Difficult doing work/chores  Somewhat difficult   Very difficult      10/19/2022   10:35 AM 08/22/2022    2:16 PM 05/05/2022    1:41 PM 04/15/2022    9:55 AM  GAD 7 : Generalized Anxiety Score  Nervous, Anxious, on Edge 1 1 1 1   Control/stop worrying 1 1 1  0  Worry too much - different things 1 1 1 1   Trouble relaxing 0 0 1 1  Restless 0 0 0 0  Easily annoyed or irritable 1 1 0 0  Afraid - awful might happen 0 0 0 1  Total GAD 7 Score 4 4 4 4   Anxiety Difficulty   Very difficult    Anhedonia: no Weight changes: no Insomnia: no   Hypersomnia: no Fatigue/loss of energy: yes Feelings of worthlessness: no Feelings of guilt: no Impaired concentration/indecisiveness: no Suicidal ideations: no  Crying spells: no Recent Stressors/Life Changes: yes   Relationship problems: no   Family stress: yes     Financial stress: no    Job stress: no    Recent death/loss: no  Relevant past medical, surgical, family and social history reviewed and updated as indicated. Interim medical history since our last visit reviewed. Allergies and medications reviewed and updated.  Review of  Systems  Constitutional: Negative.   Respiratory: Negative.    Cardiovascular: Negative.   Gastrointestinal: Negative.   Musculoskeletal: Negative.   Neurological: Negative.   Psychiatric/Behavioral:  Negative for agitation, behavioral problems, confusion, decreased concentration, dysphoric mood, hallucinations, self-injury, sleep disturbance and suicidal ideas. The patient is nervous/anxious. The patient is not hyperactive.     Per HPI unless specifically indicated above     Objective:    BP 131/80   Pulse 65   Temp 97.7 F (36.5 C) (Oral)   Wt 211 lb 6.4 oz (95.9 kg)   LMP  (LMP Unknown)   SpO2 96%   BMI 35.18 kg/m   Wt Readings from Last 3 Encounters:  10/19/22 211 lb 6.4 oz (95.9 kg)  08/22/22  210 lb 12.8 oz (95.6 kg)  06/30/22 208 lb 14.4 oz (94.8 kg)    Physical Exam Vitals and nursing note reviewed.  Constitutional:      General: She is not in acute distress.    Appearance: Normal appearance. She is not ill-appearing, toxic-appearing or diaphoretic.  HENT:     Head: Normocephalic and atraumatic.     Right Ear: External ear normal.     Left Ear: External ear normal.     Nose: Nose normal.     Mouth/Throat:     Mouth: Mucous membranes are moist.     Pharynx: Oropharynx is clear.  Eyes:     General: No scleral icterus.       Right eye: No discharge.        Left eye: No discharge.     Extraocular Movements: Extraocular movements intact.     Conjunctiva/sclera: Conjunctivae normal.     Pupils: Pupils are equal, round, and reactive to light.  Cardiovascular:     Rate and Rhythm: Normal rate and regular rhythm.     Pulses: Normal pulses.     Heart sounds: Normal heart sounds. No murmur heard.    No friction rub. No gallop.  Pulmonary:     Effort: Pulmonary effort is normal. No respiratory distress.     Breath sounds: Normal breath sounds. No stridor. No wheezing, rhonchi or rales.  Chest:     Chest wall: No tenderness.  Musculoskeletal:        General: Normal range of motion.     Cervical back: Normal range of motion and neck supple.  Skin:    General: Skin is warm and dry.     Capillary Refill: Capillary refill takes less than 2 seconds.     Coloration: Skin is not jaundiced or pale.     Findings: No bruising, erythema, lesion or rash.  Neurological:     General: No focal deficit present.     Mental Status: She is alert and oriented to person, place, and time. Mental status is at baseline.  Psychiatric:        Mood and Affect: Mood normal.        Behavior: Behavior normal.        Thought Content: Thought content normal.        Judgment: Judgment normal.     Results for orders placed or performed in visit on 06/30/22  Bayer DCA Hb A1c Waived  Result Value  Ref Range   HB A1C (BAYER DCA - WAIVED) 5.8 (H) 4.8 - 5.6 %      Assessment & Plan:   Problem List Items Addressed This Visit       Cardiovascular and Mediastinum   HTN (hypertension)  Under good control on current regimen. Continue current regimen. Continue to monitor. Call with any concerns. Refills given. Labs drawn today.        Relevant Orders   CBC with Differential/Platelet   Comprehensive metabolic panel     Endocrine   IFG (impaired fasting glucose) - Primary    Up slightly with A1c of 6.1. Continue diet and exercise. Continue to monitor. Call with any concerns.      Relevant Orders   Bayer DCA Hb A1c Waived   CBC with Differential/Platelet   Comprehensive metabolic panel     Other   Hyperlipidemia    Unable to tolerate statins. Rechecking labs today. Await results.       Relevant Orders   CBC with Differential/Platelet   Comprehensive metabolic panel   Lipid Panel w/o Chol/HDL Ratio   Depression, major, recurrent, moderate (HCC)    Under good control on current regimen. Continue current regimen. Continue to monitor. Call with any concerns. Refills given.       Anxiety    Under good control on current regimen. Continue current regimen. Continue to monitor. Call with any concerns. Refills given. Rx for klonopin should last about 6 months.       Relevant Orders   CBC with Differential/Platelet   Comprehensive metabolic panel     Follow up plan: Return in about 6 months (around 04/21/2023) for welcome to medicare if going on medicare at her birthday.

## 2022-10-19 NOTE — Assessment & Plan Note (Addendum)
Under good control on current regimen. Continue current regimen. Continue to monitor. Call with any concerns. Refills given. Rx for klonopin should last about 6 months.

## 2022-10-20 LAB — CBC WITH DIFFERENTIAL/PLATELET
Basophils Absolute: 0 10*3/uL (ref 0.0–0.2)
Basos: 0 %
EOS (ABSOLUTE): 0.1 10*3/uL (ref 0.0–0.4)
Eos: 2 %
Hematocrit: 44.5 % (ref 34.0–46.6)
Hemoglobin: 14.6 g/dL (ref 11.1–15.9)
Immature Grans (Abs): 0 10*3/uL (ref 0.0–0.1)
Immature Granulocytes: 0 %
Lymphocytes Absolute: 3.4 10*3/uL — ABNORMAL HIGH (ref 0.7–3.1)
Lymphs: 46 %
MCH: 29.3 pg (ref 26.6–33.0)
MCHC: 32.8 g/dL (ref 31.5–35.7)
MCV: 89 fL (ref 79–97)
Monocytes Absolute: 0.6 10*3/uL (ref 0.1–0.9)
Monocytes: 8 %
Neutrophils Absolute: 3.4 10*3/uL (ref 1.4–7.0)
Neutrophils: 44 %
Platelets: 250 10*3/uL (ref 150–450)
RBC: 4.98 x10E6/uL (ref 3.77–5.28)
RDW: 13 % (ref 11.7–15.4)
WBC: 7.6 10*3/uL (ref 3.4–10.8)

## 2022-10-20 LAB — COMPREHENSIVE METABOLIC PANEL
ALT: 32 IU/L (ref 0–32)
AST: 28 IU/L (ref 0–40)
Albumin: 4.5 g/dL (ref 3.9–4.9)
Alkaline Phosphatase: 87 IU/L (ref 44–121)
BUN/Creatinine Ratio: 17 (ref 12–28)
BUN: 17 mg/dL (ref 8–27)
Bilirubin Total: 1.4 mg/dL — ABNORMAL HIGH (ref 0.0–1.2)
CO2: 25 mmol/L (ref 20–29)
Calcium: 9.5 mg/dL (ref 8.7–10.3)
Chloride: 101 mmol/L (ref 96–106)
Creatinine, Ser: 1.03 mg/dL — ABNORMAL HIGH (ref 0.57–1.00)
Globulin, Total: 2.5 g/dL (ref 1.5–4.5)
Glucose: 87 mg/dL (ref 70–99)
Potassium: 4.5 mmol/L (ref 3.5–5.2)
Sodium: 137 mmol/L (ref 134–144)
Total Protein: 7 g/dL (ref 6.0–8.5)
eGFR: 61 mL/min/{1.73_m2} (ref >59)

## 2022-10-20 LAB — LIPID PANEL W/O CHOL/HDL RATIO
Cholesterol, Total: 241 mg/dL — ABNORMAL HIGH (ref 100–199)
HDL: 50 mg/dL (ref >39)
LDL Chol Calc (NIH): 175 mg/dL — ABNORMAL HIGH (ref 0–99)
Triglycerides: 93 mg/dL (ref 0–149)
VLDL Cholesterol Cal: 16 mg/dL (ref 5–40)

## 2022-10-20 NOTE — Progress Notes (Signed)
Contacted via MyChart The 10-year ASCVD risk score (Arnett DK, et al., 2019) is: 11.1%   Values used to calculate the score:     Age: 67 years     Sex: Female     Is Non-Hispanic African American: Yes     Diabetic: No     Tobacco smoker: No     Systolic Blood Pressure: 131 mmHg     Is BP treated: Yes     HDL Cholesterol: 50 mg/dL     Total Cholesterol: 241 mg/dL   Good afternoon Matha, your labs have returned (I am sending results for Dr. Laural Benes as she is on vacation): - Kidney function, creatinine and eGFR, remains normal, as is liver function, AST and ALT.  - CBC shows no infection or anemia - Cholesterol levels remain elevated, at level where medication would be beneficial.  Continue visits and discussions with Dr. Laural Benes.  Over time and in combination with inflammation and other factors, this contributes to plaque which in turn may lead to stroke and/or heart attack down the road. Sometimes high LDL (bad cholesterol) is primarily genetic, and people might be eating all the right foods but still have high numbers. Other times, there is room for improvement in one's diet and eating healthier can bring this number down and potentially reduce one's risk of heart attack and/or stroke.   To reduce your LDL, Remember - more fruits and vegetables, more fish, and limit red meat and dairy products. More soy, nuts, beans, barley, lentils, oats and plant sterol ester enriched margarine instead of butter. I also encourage eliminating sugar and processed food. Remember, shop on the outside of the grocery store and visit your [employer]. Any questions?

## 2022-11-04 LAB — HM MAMMOGRAPHY

## 2022-11-07 ENCOUNTER — Encounter: Payer: Self-pay | Admitting: Family Medicine

## 2022-11-21 NOTE — Telephone Encounter (Signed)
Per Carollee Herter could see patient in [place] today but would need to go to the lab in [place] at 1 pm for UA. Spoke with patient, patient states she is much better today, no more bleeding and pain subsided. Yesterday was severe, she is not sure if maybe she passed a stone. Offered an appointment to come in to follow up but patient wanted to wait at this time. Advised patient to call us if needs an appointment urgently like this as we may not be able to address mychart message fast. Patient verbalized understanding.

## 2022-11-22 ENCOUNTER — Ambulatory Visit: Payer: Self-pay | Admitting: *Deleted

## 2022-11-22 NOTE — Telephone Encounter (Signed)
I don't have anything in the next 24 hours. If she can wait until Thursday I may be able to see her, but I'm not sure if anyone else has anything

## 2022-11-22 NOTE — Telephone Encounter (Signed)
Added patient to Friday with a different provider and will see Dr. Laural Benes can squeeze her in on Thursday left message on machine and added to the wait list as well.

## 2022-11-22 NOTE — Telephone Encounter (Signed)
Summary: burning when urinating+ blood in urine+ possible UTI   Per agent: "Patient called and stated she possibly passed a kidney stone over the weekend but is not sure. Also, patient stated she has a possible UTI. She is having alot of pelvic pain and burning when urinating. Patient also stated over the weekend she felt pressure, like she had more urine but nothing came out. Also, patient noticed some blood in urine on Sunday, none on Monday, more blood in urine yesterday on Tuesday and none today so far on Wednesday. Also frequent urination. Sometimes she does see Urologist at [facility]), Carman Ching, PA-C is who she sees." (Timeline is not correct.) Please advise. Patients call back # [phone]      Chief Complaint: Bladder pressure,burning Symptoms: States may have passed kidney stone Sunday. H/O stones. Had hematuria Sunday, Monday, none presently. States "Saw something in commode that was probably a stone." Pressure and burning remain.  Frequency: Sunday Pertinent Negatives: Patient denies fever, hematuria Disposition: [] ED /[] Urgent Care (no appt availability in office) / [] Appointment(In office/virtual)/ []  [place]/ [] Home Care/ [x] Refused Recommended Disposition /[] [facility]/ []  Follow-up with PCP Additional Notes: No availability at practice. Pt does see a urologist, Carman Ching, PA-C. Advised to contact urologist, go to UC for further eval.of UTI as no availability at practice.  Pt states she would like to "Start with Dr. Laural Benes, I won't go to an UC right now."  Assured pt NT would route to practice for PCPs review and final disposition.  Reiterated need to alert urologist.  Please advise.  Reason for Disposition  Urinating more frequently than usual (i.e., frequency)  Answer Assessment - Initial Assessment Questions 1. SYMPTOM: "What's the main symptom you're concerned about?" (e.g., frequency, incontinence)      Bladder pressure, burning 2. ONSET: "When did the    start?"     Sunday 3. PAIN: "Is there any pain?" If Yes, ask: "How bad is it?" (Scale: 1-10; mild, moderate, severe)     Pressure 4. CAUSE: "What do you think is causing the symptoms?"     UTI. Might have passed stone 5. OTHER SYMPTOMS: "Do you have any other symptoms?" (e.g., blood in urine, fever, flank pain, pain with urination)    Last evening,  no blood presently. Lower back pain right sided, "But I usually have back pain."  Protocols used: Urinary Symptoms-A-AH

## 2022-11-22 NOTE — Telephone Encounter (Signed)
Patient called the office back and stated that she has an appoint with urology.

## 2022-11-23 ENCOUNTER — Encounter: Payer: Self-pay | Admitting: Physician Assistant

## 2022-11-23 ENCOUNTER — Ambulatory Visit: Payer: BC Managed Care – PPO | Admitting: Physician Assistant

## 2022-11-23 VITALS — BP 165/88 | HR 76 | Ht 64.0 in | Wt 214.0 lb

## 2022-11-23 DIAGNOSIS — Z87440 Personal history of urinary (tract) infections: Secondary | ICD-10-CM

## 2022-11-23 DIAGNOSIS — R109 Unspecified abdominal pain: Secondary | ICD-10-CM

## 2022-11-23 DIAGNOSIS — R310 Gross hematuria: Secondary | ICD-10-CM

## 2022-11-23 DIAGNOSIS — R1030 Lower abdominal pain, unspecified: Secondary | ICD-10-CM

## 2022-11-23 DIAGNOSIS — N390 Urinary tract infection, site not specified: Secondary | ICD-10-CM

## 2022-11-23 DIAGNOSIS — R300 Dysuria: Secondary | ICD-10-CM | POA: Diagnosis not present

## 2022-11-23 DIAGNOSIS — R6883 Chills (without fever): Secondary | ICD-10-CM

## 2022-11-23 DIAGNOSIS — R110 Nausea: Secondary | ICD-10-CM

## 2022-11-23 DIAGNOSIS — R350 Frequency of micturition: Secondary | ICD-10-CM

## 2022-11-23 LAB — URINALYSIS, COMPLETE
Bilirubin, UA: NEGATIVE
Glucose, UA: NEGATIVE
Nitrite, UA: POSITIVE — AB
Specific Gravity, UA: >1.03 — ABNORMAL HIGH (ref 1.005–1.030)
Urobilinogen, Ur: 0.2 mg/dL (ref 0.2–1.0)
pH, UA: 5.5 (ref 5.0–7.5)

## 2022-11-23 LAB — MICROSCOPIC EXAMINATION: WBC, UA: >30 /hpf — AB (ref 0–5)

## 2022-11-23 MED ORDER — SULFAMETHOXAZOLE-TRIMETHOPRIM 800-160 MG PO TABS: 1.0000 | ORAL_TABLET | Freq: Two times a day (BID) | ORAL | 0 refills | Status: AC

## 2022-11-23 MED ORDER — ONDANSETRON 4 MG PO TBDP: 4.0000 mg | ORAL_TABLET | Freq: Three times a day (TID) | ORAL | 0 refills | Status: DC | PRN

## 2022-11-23 NOTE — Progress Notes (Addendum)
11/23/2022 4:26 PM   LILLYANAH GINGER 1955/12/30 [id number]  CC: Chief Complaint  Patient presents with   Urinary Tract Infection   HPI: Tracie Sheppard is a 67 y.o. female with PMH recurrent UTI, microscopic hematuria with benign workup in 2023, and nephrolithiasis who presents today for evaluation of possible UTI.   Today she reports 5 days of lower abdominal pain, chills, diaphoresis, dysuria, frequency, nausea/anorexia, and gross hematuria.  She denies fevers.  She has been having some occasional twinges of discomfort in her right flank, however she has a long history of chronic back problems.  She is no longer using vaginal estrogen cream.  In-office UA today positive for trace ketones, 1+ blood, 1+ protein, nitrates, and 2+ leukocytes; urine microscopy with >30 WBCs/HPF, 3-10 RBCs/HPF, and many bacteria.   PMH: Past Medical History:  Diagnosis Date   Allergy    Seasonal   Anxiety    Bilateral carpal tunnel syndrome 05/30/2017   Chronic back pain    Depression, major, recurrent, moderate (HCC)    Diabetes mellitus without complication (HCC)    Prediabetic   High serum testosterone    HTN (hypertension) 09/12/2019   Kidney stone    Sleep apnea    Thyroid disease    did not show up in lab work but per other symptoms previous doctor started her on this    Surgical History: Past Surgical History:  Procedure Laterality Date   CHOLECYSTECTOMY     1997   COLONOSCOPY WITH PROPOFOL N/A 11/16/2018   Procedure: COLONOSCOPY WITH PROPOFOL;  Surgeon: Toney Reil, MD;  Location: [facility] ENDOSCOPY;  Service: Gastroenterology;  Laterality: N/A;   CYSTOSCOPY  09/03/2021   ESOPHAGOGASTRODUODENOSCOPY (EGD) WITH PROPOFOL N/A 11/16/2018   Procedure: ESOPHAGOGASTRODUODENOSCOPY (EGD) WITH PROPOFOL;  Surgeon: Toney Reil, MD;  Location: [facility] ENDOSCOPY;  Service: Gastroenterology;  Laterality: N/A;   NASAL SEPTUM SURGERY     OOPHORECTOMY     TUBAL LIGATION     1989     Home Medications:  Allergies as of 11/23/2022       Reactions   Aleve [naproxen Sodium] Other (See Comments)   Chest pain   Statins Other (See Comments)   myalgias        Medication List        Accurate as of November 23, 2022  4:26 PM. If you have any questions, ask your nurse or doctor.          acetaminophen 500 MG tablet Commonly known as: TYLENOL Tylenol Extra Strength 500 mg tablet   Alpha-Lipoic Acid 600 MG Caps Take 600 mg by mouth daily.   buPROPion 300 MG 24 hr tablet Commonly known as: WELLBUTRIN XL Take 1 tablet (300 mg total) by mouth daily.   cetirizine 10 MG tablet Commonly known as: ZYRTEC Take 1 tablet (10 mg total) by mouth daily as needed for allergies.   citalopram 40 MG tablet Commonly known as: CELEXA Take 1.5 tablets (60 mg total) by mouth daily.   clonazePAM 0.5 MG tablet Commonly known as: KLONOPIN TAKE 1 TABLET BY MOUTH DAILY AS NEEDED FOR ANIXETY   Cyanocobalamin 1000 MCG Tbcr Take 1,000 mcg by mouth daily.   cyclobenzaprine 10 MG tablet Commonly known as: FLEXERIL Take 0.5-1 tablets (5-10 mg total) by mouth at bedtime.   diclofenac 75 MG EC tablet Commonly known as: VOLTAREN Take 1 tablet (75 mg total) by mouth 2 (two) times daily.   estradiol 0.1 MG/GM vaginal cream Commonly known as:  ESTRACE Apply one pea-sized amount around the opening of the urethra daily for 2 weeks, then 3 times weekly moving forward.   fluticasone 50 MCG/ACT nasal spray Commonly known as: FLONASE SPRAY 1 SPRAY INTO EACH NOSTRIL EVERY DAY   meloxicam 15 MG tablet Commonly known as: MOBIC Take 1 tablet (15 mg total) by mouth daily.   ondansetron 4 MG disintegrating tablet Commonly known as: ZOFRAN-ODT Take 1 tablet (4 mg total) by mouth every 8 (eight) hours as needed for nausea or vomiting. Started by: Carman Ching   propranolol ER 60 MG 24 hr capsule Commonly known as: INDERAL LA Take 1 capsule (60 mg total) by mouth daily.    spironolactone 100 MG tablet Commonly known as: ALDACTONE Take 1 tablet (100 mg total) by mouth 2 (two) times daily.   sulfamethoxazole-trimethoprim 800-160 MG tablet Commonly known as: BACTRIM DS Take 1 tablet by mouth 2 (two) times daily for 10 days. Started by: Carman Ching   VITAMIN D PO Take by mouth daily. 1000 mcg        Allergies:  Allergies  Allergen Reactions   Aleve [Naproxen Sodium] Other (See Comments)    Chest pain   Statins Other (See Comments)    myalgias    Family History: Family History  Problem Relation Age of Onset   Diabetes Mother    Anemia Mother    Hyperlipidemia Mother    Arthritis Mother    Hypertension Father    Cancer Father    Celiac disease Sister    Diabetes Maternal Grandmother    Stroke Maternal Grandmother    Heart attack Maternal Grandmother    Heart disease Paternal Grandfather     Social History:   reports that she has never smoked. She has never used smokeless tobacco. She reports current alcohol use. She reports that she does not use drugs.  Physical Exam: BP (!) 165/88   Pulse 76   Ht 5\' 4"  (1.626 m)   Wt 214 lb (97.1 kg)   LMP  (LMP Unknown)   BMI 36.73 kg/m   Constitutional:  Alert and oriented, no acute distress, nontoxic appearing HEENT: [place], AT Cardiovascular: No clubbing, cyanosis, or edema Respiratory: Normal respiratory effort, no increased work of breathing Skin: No rashes, bruises or suspicious lesions Neurologic: Grossly intact, no focal deficits, moving all 4 extremities Psychiatric: Normal mood and affect  Laboratory Data: Results for orders placed or performed in visit on 11/23/22  Microscopic Examination   Urine  Result Value Ref Range   WBC, UA >30 (A) 0 - 5 /hpf   RBC, Urine 3-10 (A) 0 - 2 /hpf   Epithelial Cells (non renal) 0-10 0 - 10 /hpf   Bacteria, UA Many (A) None seen/Few  Urinalysis, Complete  Result Value Ref Range   Specific Gravity, UA >1.030 (H) 1.005 - 1.030   pH, UA  5.5 5.0 - 7.5   Color, UA Yellow Yellow   Appearance Ur Cloudy (A) Clear   Leukocytes,UA 2+ (A) Negative   Protein,UA 1+ (A) Negative/Trace   Glucose, UA Negative Negative   Ketones, UA Trace (A) Negative   RBC, UA 1+ (A) Negative   Bilirubin, UA Negative Negative   Urobilinogen, Ur 0.2 0.2 - 1.0 mg/dL   Nitrite, UA Positive (A) Negative   Microscopic Examination See below:    Assessment & Plan:   1. Recurrent UTI UA appears grossly infected today.  With her history of urosepsis and reports of right flank pain, I question early  ascending UTI.  Fortunately, she is VSS in clinic today, low suspicion for sepsis at this time.  Will start empiric Bactrim x 10 days for early ureteritis/pyelonephritis and send for culture for further evaluation.  I am also giving her some Zofran for symptom control.  We discussed return precautions including persistent or worsening symptoms including new fevers. I encouraged her to resume vaginal estrogen cream and we discussed MOA in UTI prevention.  Will plan for lab visit for UA in about 2 weeks.  Will defer repeat hematuria workup for now unless micro heme has progressively worsened or she continues to have gross hematuria despite culture appropriate therapy. - Urinalysis, Complete - CULTURE, URINE COMPREHENSIVE - sulfamethoxazole-trimethoprim (BACTRIM DS) 800-160 MG tablet; Take 1 tablet by mouth 2 (two) times daily for 10 days.  Dispense: 20 tablet; Refill: 0 - ondansetron (ZOFRAN-ODT) 4 MG disintegrating tablet; Take 1 tablet (4 mg total) by mouth every 8 (eight) hours as needed for nausea or vomiting.  Dispense: 20 tablet; Refill: 0   Return in about 2 weeks (around 12/07/2022) for Lab visit for UA.  Carman Ching, PA-C  [facility] [place] [address] [place], [place] [address] [phone]

## 2022-11-25 ENCOUNTER — Ambulatory Visit: Payer: BC Managed Care – PPO | Admitting: Physician Assistant

## 2022-11-28 LAB — CULTURE, URINE COMPREHENSIVE

## 2022-12-06 ENCOUNTER — Other Ambulatory Visit: Payer: Self-pay | Admitting: *Deleted

## 2022-12-06 DIAGNOSIS — N390 Urinary tract infection, site not specified: Secondary | ICD-10-CM

## 2022-12-06 NOTE — Addendum Note (Signed)
Addended by: Levada Schilling on: 12/06/2022 02:20 PM   Modules accepted: Orders

## 2022-12-07 ENCOUNTER — Other Ambulatory Visit: Payer: BC Managed Care – PPO

## 2022-12-09 ENCOUNTER — Other Ambulatory Visit: Payer: Medicare PPO

## 2022-12-09 DIAGNOSIS — N390 Urinary tract infection, site not specified: Secondary | ICD-10-CM

## 2022-12-09 LAB — URINALYSIS, COMPLETE
Bilirubin, UA: NEGATIVE
Glucose, UA: NEGATIVE
Leukocytes,UA: NEGATIVE
Nitrite, UA: NEGATIVE
Specific Gravity, UA: >1.03 — ABNORMAL HIGH (ref 1.005–1.030)
Urobilinogen, Ur: 1 mg/dL (ref 0.2–1.0)
pH, UA: 5.5 (ref 5.0–7.5)

## 2022-12-09 LAB — MICROSCOPIC EXAMINATION

## 2022-12-13 ENCOUNTER — Ambulatory Visit (INDEPENDENT_AMBULATORY_CARE_PROVIDER_SITE_OTHER): Payer: Medicare PPO | Admitting: Nurse Practitioner

## 2022-12-13 ENCOUNTER — Encounter: Payer: Self-pay | Admitting: Nurse Practitioner

## 2022-12-13 ENCOUNTER — Ambulatory Visit: Payer: Medicare PPO | Admitting: Family Medicine

## 2022-12-13 VITALS — BP 149/73 | HR 74 | Temp 97.8°F | Wt 214.2 lb

## 2022-12-13 DIAGNOSIS — H6120 Impacted cerumen, unspecified ear: Secondary | ICD-10-CM | POA: Insufficient documentation

## 2022-12-13 DIAGNOSIS — H6123 Impacted cerumen, bilateral: Secondary | ICD-10-CM

## 2022-12-13 LAB — CULTURE, URINE COMPREHENSIVE

## 2022-12-13 NOTE — Patient Instructions (Signed)
Ear Irrigation Ear irrigation is a procedure to wash dirt and wax out of your ear canal. It's also called lavage. You may need this if you're having trouble hearing because of wax in your ear. You may also have it done as part of the treatment for an ear infection.  Getting wax and dirt out of your ear can help ear drops work better. Tell a health care provider about: Any allergies you have. All medicines you're taking, including vitamins, herbs, eye drops, creams, and over-the-counter medicines. Any problems you or family members have had with anesthesia. Any bleeding problems you have. Any surgeries you've had. This includes any ear surgeries. Any medical conditions you have, such as any problems with your ear. Whether you're pregnant or may be pregnant. What are the risks? Your health care provider will talk with you about risks. These may include: Infection. Pain. Loss of hearing. Fluid and debris being pushed into your middle ear. This can happen if there are holes in your eardrum. The procedure not working. Trauma to your ear. Feeling dizzy, light-headed, or nauseous. What happens before the procedure? You'll talk with your provider about the procedure and plan. You may be given ear drops to put in your ear 15-20 minutes before the procedure. This helps loosen the wax. What happens during the procedure?  A syringe will be filled with water or a saline solution. Saline is made of salt and water. The syringe will be gently put into your ear. The fluid will be used to wash out wax and other debris. The procedure may vary among providers and hospitals. What can I expect after the procedure? Follow the instructions given to you by your provider. Follow these instructions at home: Using ear irrigation kits In some cases, you can use an ear irrigation kit at home. Ask your provider if this is an option for you. Use the kit only as told by your provider. Read the instructions on the  package. Follow the directions for using the syringe. Use water that's at room temperature. Do not use an ear irrigation kit if: You have diabetes. This can make you more likely to get an infection. You have a hole or tear in your eardrum. You have tubes in your ears. You've had ear surgery before. You've been told not to irrigate your ears. Cleaning your ears  Clean the outside of your ear with a soft washcloth each day. If told by your provider, use a few drops of baby oil, mineral oil, glycerin, hydrogen peroxide, or earwax softening drops. Do not use cotton swabs to clean your ears. These can push wax down into the ear canal. Do not put things into your ears to try to get rid of wax. This includes ear candles. General instructions Take over-the-counter and prescription medicines only as told by your provider. If you were prescribed antibiotics, use them as told by your provider. Do not stop using the antibiotic even if you start to feel better. Keep your ear clean and dry as told by your provider. See your provider at least once a year to have your ears and hearing checked. Contact a health care provider if: Your hearing isn't getting better. Your hearing is getting worse. You have pain or redness in your ear. You feel dizzy. You have ringing in your ears. You have nausea or vomiting. This information is not intended to replace advice given to you by your health care provider. Make sure you discuss any questions you have with  your health care provider. Document Revised: 06/02/2022 Document Reviewed: 06/02/2022 Elsevier Patient Education  2024 [employer].

## 2022-12-13 NOTE — Assessment & Plan Note (Signed)
Acute, able to clear left ear, but right ear unable to clear.  Recommend she use Debrox daily in right ear for one week and then return for irrigation.  She is to use Claritin daily, can take at night if makes tired, for next 2 weeks.

## 2022-12-13 NOTE — Progress Notes (Signed)
BP (!) 149/73   Pulse 74   Temp 97.8 F (36.6 C) (Oral)   Wt 214 lb 3.2 oz (97.2 kg)   LMP  (LMP Unknown)   SpO2 96%   BMI 36.77 kg/m    Subjective:    Patient ID: Tracie Sheppard, female    DOB: 03/20/1956, 67 y.o.   MRN: [id number]  HPI: Tracie Sheppard is a 67 y.o. female  Chief Complaint  Patient presents with   Otitis Media    Pt states she has been having bilateral ear pressure for the last week. States she feels like they are very clogged. States she has used flonase and taken Zyrtec.    EAR PRESSURE Presents for ear pressure over past week.  No pain with this, only pressure.   Duration: weeks Involved ear(s): bilateral Fever: no Otorrhea: no Upper respiratory infection symptoms: no -- allergy symptoms present, taking Flonase and Zyrtec, only took one yesterday Pruritus: no Hearing loss:  more muffled Water immersion no Using Q-tips: yes Recurrent otitis media: no Status: stable Treatments attempted:  Flonase and Zyrtec    Relevant past medical, surgical, family and social history reviewed and updated as indicated. Interim medical history since our last visit reviewed. Allergies and medications reviewed and updated.  Review of Systems  Constitutional:  Negative for activity change, appetite change, diaphoresis, fatigue and fever.  HENT:  Positive for postnasal drip. Negative for congestion, ear discharge, ear pain, rhinorrhea and sinus pressure.   Respiratory:  Negative for cough, chest tightness, shortness of breath and wheezing.   Cardiovascular:  Negative for chest pain, palpitations and leg swelling.  Gastrointestinal: Negative.   Neurological: Negative.   Psychiatric/Behavioral: Negative.      Per HPI unless specifically indicated above     Objective:    BP (!) 149/73   Pulse 74   Temp 97.8 F (36.6 C) (Oral)   Wt 214 lb 3.2 oz (97.2 kg)   LMP  (LMP Unknown)   SpO2 96%   BMI 36.77 kg/m   Wt Readings from Last 3 Encounters:  12/13/22 214 lb  3.2 oz (97.2 kg)  11/23/22 214 lb (97.1 kg)  10/19/22 211 lb 6.4 oz (95.9 kg)    Physical Exam Vitals and nursing note reviewed.  Constitutional:      General: She is awake. She is not in acute distress.    Appearance: She is well-developed and well-groomed. She is obese. She is not ill-appearing or toxic-appearing.  HENT:     Head: Normocephalic.     Right Ear: Hearing, ear canal and external ear normal. No tenderness. There is impacted cerumen.     Left Ear: Hearing, ear canal and external ear normal. No tenderness. There is impacted cerumen.     Ears:     Comments: Impacted cerumen on exam R>L.  Placed drops in ears and CMA irrigated and able to clear left ear with view of TM, right ear unable to clear, unable to see TM. Eyes:     General: Lids are normal.        Right eye: No discharge.        Left eye: No discharge.     Conjunctiva/sclera: Conjunctivae normal.     Pupils: Pupils are equal, round, and reactive to light.  Neck:     Thyroid: No thyromegaly.     Vascular: No carotid bruit.  Cardiovascular:     Rate and Rhythm: Normal rate and regular rhythm.     Heart  sounds: Normal heart sounds. No murmur heard.    No gallop.  Pulmonary:     Effort: Pulmonary effort is normal. No accessory muscle usage or respiratory distress.     Breath sounds: Normal breath sounds.  Abdominal:     General: Bowel sounds are normal. There is no distension.     Palpations: Abdomen is soft.     Tenderness: There is no abdominal tenderness.  Musculoskeletal:     Cervical back: Normal range of motion and neck supple.     Right lower leg: No edema.     Left lower leg: No edema.  Lymphadenopathy:     Cervical: No cervical adenopathy.  Skin:    General: Skin is warm and dry.  Neurological:     Mental Status: She is alert and oriented to person, place, and time.     Deep Tendon Reflexes: Reflexes are normal and symmetric.     Reflex Scores:      Brachioradialis reflexes are 2+ on the right  side and 2+ on the left side.      Patellar reflexes are 2+ on the right side and 2+ on the left side. Psychiatric:        Attention and Perception: Attention normal.        Mood and Affect: Mood normal.        Speech: Speech normal.        Behavior: Behavior normal. Behavior is cooperative.        Thought Content: Thought content normal.    Results for orders placed or performed in visit on 12/09/22  CULTURE, URINE COMPREHENSIVE   Specimen: Urine   UR  Result Value Ref Range   Urine Culture, Comprehensive Final report    Organism ID, Bacteria Comment   Microscopic Examination   Urine  Result Value Ref Range   WBC, UA 6-10 (A) 0 - 5 /hpf   RBC, Urine 0-2 0 - 2 /hpf   Epithelial Cells (non renal) 0-10 0 - 10 /hpf   Mucus, UA Present (A) Not Estab.   Bacteria, UA Moderate (A) None seen/Few  Urinalysis, Complete  Result Value Ref Range   Specific Gravity, UA >1.030 (H) 1.005 - 1.030   pH, UA 5.5 5.0 - 7.5   Color, UA Yellow Yellow   Appearance Ur Clear Clear   Leukocytes,UA Negative Negative   Protein,UA Trace (A) Negative/Trace   Glucose, UA Negative Negative   Ketones, UA Trace (A) Negative   RBC, UA Trace (A) Negative   Bilirubin, UA Negative Negative   Urobilinogen, Ur 1.0 0.2 - 1.0 mg/dL   Nitrite, UA Negative Negative   Microscopic Examination See below:       Assessment & Plan:   Problem List Items Addressed This Visit       Nervous and Auditory   Cerumen impaction - Primary    Acute, able to clear left ear, but right ear unable to clear.  Recommend she use Debrox daily in right ear for one week and then return for irrigation.  She is to use Claritin daily, can take at night if makes tired, for next 2 weeks.        Follow up plan: Return in about 1 week (around 12/20/2022) for Ear irrigation right.

## 2022-12-20 ENCOUNTER — Ambulatory Visit: Payer: Medicare PPO

## 2022-12-27 ENCOUNTER — Ambulatory Visit (INDEPENDENT_AMBULATORY_CARE_PROVIDER_SITE_OTHER): Payer: Medicare PPO

## 2022-12-27 DIAGNOSIS — H6121 Impacted cerumen, right ear: Secondary | ICD-10-CM

## 2022-12-27 NOTE — Progress Notes (Signed)
Patient presented to the office today for ear wax removal. Ear wax removed from the patient's right ear until tympanic membrane was visible. Left ear did not need flushing as tympanic membrane was visible. Patient tolerated well and had no questions.

## 2023-01-02 ENCOUNTER — Ambulatory Visit: Payer: Self-pay

## 2023-01-02 NOTE — Telephone Encounter (Signed)
Summary: leg pain   Right plank pain and right hip pain that radiates down her leg that goes through her calf. Please f/u with patient     Chief Complaint: Right flank pain, hip and leg. Has appointment tomorrow, but asking to be worked in with Dr. Laural Benes if possible. Symptoms: Above Frequency: Several weeks Pertinent Negatives: Patient denies  Disposition: [] ED /[] Urgent Care (no appt availability in office) / [x] Appointment(In office/virtual)/ []  [facility]/ [] Home Care/ [] Refused Recommended Disposition /[] [facility]/ []  Follow-up with PCP Additional Notes: Please advise pt.  Reason for Disposition  MODERATE pain (e.g., interferes with normal activities or awakens from sleep)  Answer Assessment - Initial Assessment Questions 1. LOCATION: "Where does it hurt?" (e.g., left, right)     Right flank pain 2. ONSET: "When did the pain start?"     August 3. SEVERITY: "How bad is the pain?" (e.g., Scale 1-10; mild, moderate, or severe)   - MILD (1-3): doesn't interfere with normal activities    - MODERATE (4-7): interferes with normal activities or awakens from sleep    - SEVERE (8-10): excruciating pain and patient unable to do normal activities (stays in bed)       6 4. PATTERN: "Does the pain come and go, or is it constant?"      Constant 5. CAUSE: "What do you think is causing the pain?"     Maybe back pain 6. OTHER SYMPTOMS:  "Do you have any other symptoms?" (e.g., fever, abdomen pain, vomiting, leg weakness, burning with urination, blood in urine)     Right hip pain and down leg 7. PREGNANCY:  "Is there any chance you are pregnant?" "When was your last menstrual period?"     No  Protocols used: Flank Pain-A-AH

## 2023-01-03 ENCOUNTER — Ambulatory Visit: Payer: Medicare PPO | Admitting: Family Medicine

## 2023-01-03 VITALS — BP 140/80 | HR 76 | Temp 97.9°F | Ht 64.96 in | Wt 216.2 lb

## 2023-01-03 DIAGNOSIS — M5416 Radiculopathy, lumbar region: Secondary | ICD-10-CM | POA: Diagnosis not present

## 2023-01-03 DIAGNOSIS — M25551 Pain in right hip: Secondary | ICD-10-CM | POA: Insufficient documentation

## 2023-01-03 DIAGNOSIS — Z23 Encounter for immunization: Secondary | ICD-10-CM

## 2023-01-03 MED ORDER — CYCLOBENZAPRINE HCL 10 MG PO TABS: 5.0000 mg | ORAL_TABLET | Freq: Every day | ORAL | 0 refills | Status: AC

## 2023-01-03 NOTE — Patient Instructions (Addendum)
Try Voltaren gel and lidocaine patches during the day  Alternate with heat and ice  Take Meloxicam daily   Try Flexeril at night for the next 5 days Follow up with social worker

## 2023-01-03 NOTE — Assessment & Plan Note (Addendum)
Acute Stable. Recommend continue regimen of Meloxicam 15 MG daily and Flexeril 10 MG nightly. Referral placed for PT, recommend use of voltaren gel, lidocaine patches, alternating heat/ice to the area, and stretches for relief. Continue scheduled [facility] appointment in 2 weeks. Return in 6 weeks, will consider imaging and PT referral if no improvement at next visit.

## 2023-01-03 NOTE — Progress Notes (Signed)
BP (!) 140/80   Pulse 76   Temp 97.9 F (36.6 C) (Oral)   Ht 5' 4.96" (1.65 m)   Wt 216 lb 3.2 oz (98.1 kg)   LMP  (LMP Unknown)   SpO2 97%   BMI 36.02 kg/m    Subjective:    Patient ID: Tracie Sheppard, female    DOB: [birth year], 67 y.o.   MRN: [id number]  HPI: Tracie Sheppard is a 68 y.o. female  Chief Complaint  Patient presents with   Back Pain    Sciatica started one week ago   BACK PAIN Has history of chronic back pain. Today she complains of new onset of pain radiation from her lower back to below the knee of her right leg that started over a week ago. She also complains of right arm soreness. She denies trauma, overuse, falls, and lifestyle changes that could be contributing to the pain she is experiencing.   Duration: 1 weeks Mechanism of injury: unknown Location: Right lower back Onset: sudden Severity: 6/10 Quality: aching Frequency: constant Radiation: Right leg below the knee Aggravating factors: movement, walking, and bending Alleviating factors: NSAIDs helps a little Status: stable Treatments attempted:  Flexxerill and ibuprofen  Relief with NSAIDs?: moderate Nighttime pain:  yes Paresthesias / decreased sensation:  no Bowel / bladder incontinence:  no Fevers:  no Dysuria / urinary frequency:  no   Relevant past medical, surgical, family and social history reviewed and updated as indicated. Interim medical history since our last visit reviewed. Allergies and medications reviewed and updated.  Review of Systems  Constitutional:  Negative for fever.  Respiratory: Negative.    Cardiovascular: Negative.   Musculoskeletal:  Positive for back pain (w radiculopathy) and myalgias. Negative for arthralgias.    Per HPI unless specifically indicated above     Objective:    BP (!) 140/80   Pulse 76   Temp 97.9 F (36.6 C) (Oral)   Ht 5' 4.96" (1.65 m)   Wt 216 lb 3.2 oz (98.1 kg)   LMP  (LMP Unknown)   SpO2 97%   BMI 36.02 kg/m   Wt Readings  from Last 3 Encounters:  01/03/23 216 lb 3.2 oz (98.1 kg)  12/13/22 214 lb 3.2 oz (97.2 kg)  11/23/22 214 lb (97.1 kg)    Physical Exam Vitals and nursing note reviewed.  Constitutional:      General: She is awake. She is not in acute distress.    Appearance: Normal appearance. She is well-developed and well-groomed. She is obese. She is not ill-appearing.  HENT:     Head: Normocephalic and atraumatic.     Right Ear: Hearing and external ear normal. No drainage.     Left Ear: Hearing and external ear normal. No drainage.     Nose: Nose normal.  Eyes:     General: Lids are normal.        Right eye: No discharge.        Left eye: No discharge.     Conjunctiva/sclera: Conjunctivae normal.  Cardiovascular:     Rate and Rhythm: Normal rate and regular rhythm.     Pulses:          Radial pulses are 2+ on the right side and 2+ on the left side.       Posterior tibial pulses are 2+ on the right side and 2+ on the left side.     Heart sounds: Normal heart sounds, S1 normal and S2 normal. No  murmur heard.    No gallop.  Pulmonary:     Effort: Pulmonary effort is normal. No accessory muscle usage or respiratory distress.     Breath sounds: Normal breath sounds.  Musculoskeletal:        General: Normal range of motion.     Right upper arm: Tenderness present. No bony tenderness.     Left upper arm: No tenderness or bony tenderness.     Cervical back: Full passive range of motion without pain and normal range of motion.     Lumbar back: Tenderness present.     Right lower leg: No edema.     Left lower leg: No edema.     Comments: Right sided pain with radiculopathy  Skin:    General: Skin is warm and dry.     Capillary Refill: Capillary refill takes less than 2 seconds.  Neurological:     Mental Status: She is alert and oriented to person, place, and time.  Psychiatric:        Attention and Perception: Attention normal.        Mood and Affect: Mood normal.        Speech: Speech  normal.        Behavior: Behavior normal. Behavior is cooperative.        Thought Content: Thought content normal.     Results for orders placed or performed in visit on 12/09/22  CULTURE, URINE COMPREHENSIVE   Specimen: Urine   UR  Result Value Ref Range   Urine Culture, Comprehensive Final report    Organism ID, Bacteria Comment   Microscopic Examination   Urine  Result Value Ref Range   WBC, UA 6-10 (A) 0 - 5 /hpf   RBC, Urine 0-2 0 - 2 /hpf   Epithelial Cells (non renal) 0-10 0 - 10 /hpf   Mucus, UA Present (A) Not Estab.   Bacteria, UA Moderate (A) None seen/Few  Urinalysis, Complete  Result Value Ref Range   Specific Gravity, UA >1.030 (H) 1.005 - 1.030   pH, UA 5.5 5.0 - 7.5   Color, UA Yellow Yellow   Appearance Ur Clear Clear   Leukocytes,UA Negative Negative   Protein,UA Trace (A) Negative/Trace   Glucose, UA Negative Negative   Ketones, UA Trace (A) Negative   RBC, UA Trace (A) Negative   Bilirubin, UA Negative Negative   Urobilinogen, Ur 1.0 0.2 - 1.0 mg/dL   Nitrite, UA Negative Negative   Microscopic Examination See below:       Assessment & Plan:   Problem List Items Addressed This Visit     Lumbar radiculopathy    Acute Stable. Recommend continue regimen of Meloxicam 15 MG daily and Flexeril 10 MG nightly. Referral placed for PT, recommend use of voltaren gel, lidocaine patches, alternating heat/ice to the area, and stretches for relief. Continue scheduled [facility] appointment in 2 weeks. Return in 6 weeks, will consider imaging and PT referral if no improvement at next visit.       Relevant Medications   cyclobenzaprine (FLEXERIL) 10 MG tablet   Other Relevant Orders   Ambulatory referral to Physical Therapy   Other Visit Diagnoses     Needs flu shot    -  Primary   Relevant Orders   Flu Vaccine Trivalent High Dose (Fluad) (Completed)        Follow up plan: Return in about 6 weeks (around 02/14/2023).

## 2023-01-09 DIAGNOSIS — H2513 Age-related nuclear cataract, bilateral: Secondary | ICD-10-CM | POA: Diagnosis not present

## 2023-01-09 DIAGNOSIS — H43393 Other vitreous opacities, bilateral: Secondary | ICD-10-CM | POA: Diagnosis not present

## 2023-01-18 ENCOUNTER — Encounter: Payer: Self-pay | Admitting: Podiatry

## 2023-01-18 ENCOUNTER — Ambulatory Visit (INDEPENDENT_AMBULATORY_CARE_PROVIDER_SITE_OTHER): Payer: Medicare PPO

## 2023-01-18 ENCOUNTER — Ambulatory Visit: Payer: Medicare PPO | Admitting: Podiatry

## 2023-01-18 VITALS — BP 162/87

## 2023-01-18 DIAGNOSIS — M779 Enthesopathy, unspecified: Secondary | ICD-10-CM | POA: Diagnosis not present

## 2023-01-18 DIAGNOSIS — S92154S Nondisplaced avulsion fracture (chip fracture) of right talus, sequela: Secondary | ICD-10-CM

## 2023-01-18 DIAGNOSIS — S93401S Sprain of unspecified ligament of right ankle, sequela: Secondary | ICD-10-CM

## 2023-01-18 NOTE — Progress Notes (Signed)
Faxed to [facility]

## 2023-01-18 NOTE — Progress Notes (Signed)
Subjective:  Patient ID: Tracie Sheppard, female    DOB: Jun 09, 1955,  MRN: [id number]  Chief Complaint  Patient presents with   Foot Pain    "My ankle hurts."    Discussed the use of AI scribe software for clinical note transcription with the patient, who gave verbal consent to proceed.  History of Present Illness   The patient presents with several months of right ankle pain, which began in the spring and summer. The pain was initially severe enough to cause the patient to walk with a cane and the ankle appeared swollen. The patient reports no specific injury but has fallen four times in the past year. The pain is located on the side of the ankle. The patient has been managing the pain with meloxicam as needed, primarily for chronic back pain. The patient also takes Flexeril as needed. The patient has been able to walk without a boot or brace. The patient also reports recent lower back pain radiating down the right leg.          Objective:    Physical Exam   MUSCULOSKELETAL: Tenderness over the anterior talofibular ligament (ATFL), no tenderness over the calcaneofibular ligament (CFL), no tenderness over the peroneal tendons. No gross instability, negative anterior drawer or talar tilt test. SKIN: Palpable pulses, warm, well-perfused foot.       No images are attached to the encounter.    Results   RADIOLOGY Ankle X-ray (01/18/2023): Small avulsion fracture at the distal fibula. Ankle joint well maintained and well aligned with excellent joint space. No abnormalities of the subtalar joint. Ankle X-ray (08/2022): Well maintained joint space. Distal fibular avulsion at the lateral process of the talus present.      Assessment:   1. Moderate ankle sprain, right, sequela   2. Closed nondisplaced avulsion fracture of right talus, sequela      Plan:  Patient was evaluated and treated and all questions answered.  Assessment and Plan    Ankle Pain   Chronic ankle pain presents  with tenderness over the ATFL, accompanied by a small avulsion fracture at the distal fibula on X-ray, indicative of an old injury. There is no gross instability or abnormalities in the subtalar joint. We will refer her to physical therapy for strengthening exercises around the ankle joint and continue meloxicam as needed for pain management. She is advised to start home exercises twice daily. A follow-up is scheduled in 8 weeks, or sooner if symptoms worsen.  Chronic Back Pain   She has ongoing chronic back pain, which may be contributing to the ankle pain due to hypersensitivity. The current management plan as per primary care physician, Dr. Laural Benes, will continue. The physical therapy referral, already in place, will be combined with ankle therapy.          Return in about 2 months (around 03/20/2023) for re-evaluate ankle injury .

## 2023-01-18 NOTE — Patient Instructions (Addendum)
VISIT SUMMARY:  During your visit, we discussed your ongoing right ankle pain and lower back pain. We found that your ankle pain is likely due to an old injury, and your back pain may be contributing to the discomfort in your ankle. We have decided to continue your current treatment plan and add physical therapy to help strengthen your ankle and back.  YOUR PLAN:  -ANKLE PAIN: Your ankle pain is likely due to an old injury. We will refer you to physical therapy to help strengthen your ankle. You should continue taking meloxicam as needed for pain management and start doing exercises at home twice daily.  -CHRONIC BACK PAIN: Your ongoing back pain may be contributing to your ankle pain. We will continue with your current treatment plan, which includes physical therapy. This therapy will now also focus on your ankle.  INSTRUCTIONS:  You have a follow-up appointment scheduled in 8 weeks, or sooner if your symptoms worsen. Please start doing exercises at home twice daily and continue taking your medications as prescribed. If you have any questions or concerns, don't hesitate to contact us.  Chronic Ankle Instability & Rehab Chronic Ankle Instability Chronic ankle instability is a condition that makes the ankle weak and more likely to give way. The condition is common among athletes, especially those with prior ankle ligament injury. Ligaments are strong tissues that connectbones to each other. What are the causes?  This condition is caused by multiple ankle sprains that have not healedproperly, leaving the ankle ligaments loose or damaged. What increases the risk? This condition is more likely to develop in people who participate in sports in which there is a risk of spraining an ankle. These sports include: Cross-country trail running. Basketball. Baseball. Tennis. Football. Soccer. What are the signs or symptoms? Symptoms of this condition include: Rolling your ankle  repeatedly. Swelling. Pain. Bruising. Tenderness. Feeling wobbly or unsteady on your foot. Difficulty walking on uneven surfaces or in the dark. How is this diagnosed? This condition may be diagnosed based on: Your symptoms. Your medical history. A physical exam. Your health care provider will check your balance, strength, and range of motion. He or she will also check your injured ankle against your healthy ankle. Imaging tests, such as: An X-ray. A CT scan. An MRI. An ultrasound. How is this treated? Treatment for this condition may include: Wearing a removable boot, brace, or splint. Wearing supportive shoes or shoe inserts. Applying ice to the ankle to reduce swelling. Taking anti-inflammatory pain medicine. Doing exercises (physical therapy). Not putting any body weight, or putting only limited body weight, on your ankle for several days. Gradually returning to full activity. Surgery to repair damaged ligaments. Usually, surgery is needed only if the condition is severe or if othertreatments do not work. Follow these instructions at home: If you have a boot, brace, or splint: Wear it as told by your health care provider. Remove it only as told by your health care provider. Loosen it if your toes tingle, become numb, or turn cold and blue. Keep it clean. If it is not waterproof: Do not let it get wet. Cover it with a watertight covering when you take a bath or a shower. Ask your health care provider when it is safe to drive with a boot, brace, or splint on your foot. Managing pain, stiffness, and swelling  If directed, put ice on the injured area. If you have a removable boot, brace, or splint, remove it as told by your health care  provider. Put ice in a plastic bag. Place a towel between your skin and the bag. Leave the ice on for 20 minutes, 2-3 times a day. Move your toes, foot, and ankle often to reduce stiffness and swelling. Raise (elevate) the injured area  above the level of your heart while you are sitting or lying down.  Activity Return to your normal activities as told by your health care provider. Ask your health care provider what activities are safe for you. Do not put your full body weight on your ankle until your health care provider says that you can. Do not do any activities that make pain or swelling worse. Do exercises as told by your health care provider. General instructions Take over-the-counter and prescription medicines only as told by your health care provider. Wear supportive shoes or inserts as told by your health care provider. Keep all follow-up visits as told by your health care provider. This is important. How is this prevented? Wear supportive footwear that is appropriate for your athletic activity. Avoid athletic activities that cause pain or swelling in your ankle. See your health care provider if you have an ankle sprain that causes pain and swelling for more than 2-4 weeks. Do ankle range-of-motion and strengthening exercises as told by your health care provider before beginning any athletic activity. If you start a new athletic activity, start gradually to build up your strength and flexibility. Contact a health care provider if: Your condition is not getting better after 2-4 weeks of treatment. You cannot put body weight on your ankle without feeling more pain. Summary Chronic ankle instability is a condition that makes the ankle weak and more likely to give way. This condition is caused by multiple ankle sprains that have not healed properly, leaving the ankle ligaments loose or damaged. Treatment includes wearing a boot, brace, or splint, taking medicines for pain and inflammation, and using ice on the affected area. Follow your health care provider's instructions for caring for your ankle during recovery. Contact your health care provider if your ankle does not get better in 2-4 weeks, or if you cannot put  weight on your ankle without feeling more pain. This information is not intended to replace advice given to you by your health care provider. Make sure you discuss any questions you have with your healthcare provider. Document Revised: 01/02/2018 Document Reviewed: 01/02/2018 Elsevier Patient Education  2022 [employer]    Ask your health care provider which exercises are safe for you. Do exercises exactly as told by your health care provider and adjust them as directed. It is normal to feel mild stretching, pulling, tightness, or discomfort as you do these exercises. Stop right away if you feel sudden pain or your pain gets worse. Do not begin these exercises until told by your health care provider. Strengthening exercises These exercises build strength and endurance in your ankle. Endurance is theability to use your muscles for a long time, even after they get tired. Ankle eversion Sit on the floor with your legs straight out in front of you. Loop a rubber exercise band around the ball of your left / right foot. The ball of your foot is on the walking surface, right under your toes. Hold the ends of the band in your hands, or secure the band to a stable object. Slowly push your foot outward, away from your other leg (eversion). Hold this position for 10 seconds. Slowly return your foot to the starting position. Repeat 10 times. Complete  this exercise 2 times a day. Heel walking  This exercise strengthens the muscles that push the ankle backward to point your toes toward your knee (ankle dorsiflexors). Walk on your heels for 10 ft. Keep your toes as high as possible. Repeat 10 times. Complete this exercise 2  times per day. Toe walking  This exercise strengthens the muscles that push the ankle forward and your toes downward (ankle plantar flexors). Walk on your toes for 10 ft. Keep your heels as high as possible. Repeat 10  times. Complete this exercise 2  times per day. Balance  exercises These exercises improve or maintain your balance. Balance is important inimproving ankle stability and preventing falls. Tandem walking Do this exercise in a hallway or room that is at least 10 ft (3 m) long. Stand with one foot directly in front of the other (tandem). You can use the walls to help you balance if needed, but try not to use them for support. Slowly lift your back foot and place it directly in front of your other foot. Continue to walk in this heel-to-toe way for 10 ft. Repeat 10  times. Complete this exercise 2  times a day. Single leg stand Without shoes, stand near a railing or in a doorway. You may hold on to the railing or door frame as needed for balance. Stand on your left / right foot. Keep your big toe down on the floor and try to keep your arch lifted. If this is too easy, you can try doing it while you do one of these actions: Keep your eyes closed. Stand on a pillow. Throw a ball against a wall and catch it when it returns. Hold this position for 10 seconds. Repeat 10 times. Complete this exercise 2 times a day. Ankle inversion and ankle eversion This exercise is also called foot rotation with a balance board. It uses a balance board to rotate the foot and ankle inward (inversion) and outward (eversion). Ask your health care provider where you can get a balance board or how you can make one. Stand on a non-carpeted surface near a countertop or wall. Step onto the balance board so your feet are hip width apart. Keep your feet in place, and keep your upper body and hips steady. Using only your feet and ankles to move the board, do the following exercises: Tip the board from side to side as far as you can, alternating between tipping to the left and tipping to the right. Tip the board so it silently taps the floor. Do not let the board forcefully hit the floor. From time to time, pause to hold a steady midway position, with neither the right nor the left  sides touching the ground. Tip the board side to side so the board does not hit the floor at all. From time to time, pause to hold a steady midway position. Repeat 10 times, pausing from time to time to hold a steady position.Complete this exercise 2 times a day. Ankle plantar flexion and ankle dorsiflexion This exercise is also called foot flexion with a balance board. It uses a balance board to push the foot downward and away from the leg (plantar flexion) or upward and toward the leg (dorsiflexion). Ask your health care provider where you can get a balance board or how you can make one. Stand on a non-carpeted surface near a countertop or wall. Step onto the balance board so your feet are hip width apart. Keep your feet in  place, and keep your upper body and hips steady. Using only your feet and ankles, do the following exercises: Tip the board forward and backward so the board silently taps the floor. Do not let the board forcefully hit the floor. From time to time, pause to hold a steady position midway between touching the floor in front and touching the floor in back. Tip the board forward and backward so the board does not hit the floor at all. From time to time, pause to hold a steady position. Repeat 10 times, pausing from time to time to hold a steady position.Complete this exercise 2 times a day. This information is not intended to replace advice given to you by your health care provider. Make sure you discuss any questions you have with your healthcare provider. Document Revised: 07/12/2018 Document Reviewed: 01/02/2018 Elsevier Patient Education  2022 [employer].

## 2023-01-24 ENCOUNTER — Ambulatory Visit: Payer: Medicare PPO | Admitting: Family Medicine

## 2023-01-24 ENCOUNTER — Encounter: Payer: Self-pay | Admitting: Family Medicine

## 2023-01-24 VITALS — BP 129/78 | HR 102 | Wt 212.6 lb

## 2023-01-24 DIAGNOSIS — M9903 Segmental and somatic dysfunction of lumbar region: Secondary | ICD-10-CM | POA: Diagnosis not present

## 2023-01-24 DIAGNOSIS — M9901 Segmental and somatic dysfunction of cervical region: Secondary | ICD-10-CM

## 2023-01-24 DIAGNOSIS — M9902 Segmental and somatic dysfunction of thoracic region: Secondary | ICD-10-CM

## 2023-01-24 DIAGNOSIS — M9900 Segmental and somatic dysfunction of head region: Secondary | ICD-10-CM

## 2023-01-24 DIAGNOSIS — M9904 Segmental and somatic dysfunction of sacral region: Secondary | ICD-10-CM

## 2023-01-24 DIAGNOSIS — M9909 Segmental and somatic dysfunction of abdomen and other regions: Secondary | ICD-10-CM

## 2023-01-24 DIAGNOSIS — M545 Low back pain, unspecified: Secondary | ICD-10-CM

## 2023-01-24 DIAGNOSIS — Z23 Encounter for immunization: Secondary | ICD-10-CM

## 2023-01-24 DIAGNOSIS — M9908 Segmental and somatic dysfunction of rib cage: Secondary | ICD-10-CM

## 2023-01-24 DIAGNOSIS — M9905 Segmental and somatic dysfunction of pelvic region: Secondary | ICD-10-CM

## 2023-01-24 NOTE — Progress Notes (Signed)
BP 129/78   Pulse (!) 102   Wt 212 lb 9.6 oz (96.4 kg)   LMP  (LMP Unknown)   SpO2 97%   BMI 35.42 kg/m    Subjective:    Patient ID: Tracie Sheppard, female    DOB: 07-14-1955, 67 y.o.   MRN: [id number]  HPI: Tracie Sheppard is a 67 y.o. female  Chief Complaint  Patient presents with   Back Pain   Tracie Sheppard presents today for evaluation and possible treatment with OMT for low back pain. She notes that her back has been bothering her for a while. She notes that she came to see Rashelle about 3 weeks ago and she is supposed to start PT in November. Pain is located in her R low back and it is radiating down the R leg. It is aching and tight. Worse with certain movement and better with flexeril and OMT. She denies any numbness or tingling in her legs, but it has continued unchanged in her hands. She notes that she is otherwise feeling well with no other concerns or complaints at this time.   Relevant past medical, surgical, family and social history reviewed and updated as indicated. Interim medical history since our last visit reviewed. Allergies and medications reviewed and updated.  Review of Systems  Constitutional: Negative.   Respiratory: Negative.    Cardiovascular: Negative.   Musculoskeletal:  Positive for back pain and myalgias. Negative for arthralgias, gait problem, joint swelling, neck pain and neck stiffness.  Skin: Negative.   Psychiatric/Behavioral:  Positive for sleep disturbance. Negative for agitation, behavioral problems, confusion, decreased concentration, dysphoric mood, hallucinations, self-injury and suicidal ideas. The patient is nervous/anxious. The patient is not hyperactive.     Per HPI unless specifically indicated above     Objective:    BP 129/78   Pulse (!) 102   Wt 212 lb 9.6 oz (96.4 kg)   LMP  (LMP Unknown)   SpO2 97%   BMI 35.42 kg/m   Wt Readings from Last 3 Encounters:  01/24/23 212 lb 9.6 oz (96.4 kg)  01/03/23 216 lb 3.2 oz (98.1 kg)   12/13/22 214 lb 3.2 oz (97.2 kg)    Physical Exam Vitals and nursing note reviewed.  Constitutional:      General: She is not in acute distress.    Appearance: Normal appearance. She is not ill-appearing.  HENT:     Head: Normocephalic and atraumatic.     Right Ear: External ear normal.     Left Ear: External ear normal.     Nose: Nose normal.     Mouth/Throat:     Mouth: Mucous membranes are moist.     Pharynx: Oropharynx is clear.  Eyes:     Extraocular Movements: Extraocular movements intact.     Conjunctiva/sclera: Conjunctivae normal.     Pupils: Pupils are equal, round, and reactive to light.  Neck:     Vascular: No carotid bruit.  Cardiovascular:     Rate and Rhythm: Normal rate.     Pulses: Normal pulses.  Pulmonary:     Effort: Pulmonary effort is normal. No respiratory distress.  Abdominal:     General: Abdomen is flat. There is no distension.     Palpations: Abdomen is soft. There is no mass.     Tenderness: There is no abdominal tenderness. There is no right CVA tenderness, left CVA tenderness, guarding or rebound.     Hernia: No hernia is present.  Musculoskeletal:  Cervical back: No muscular tenderness.  Lymphadenopathy:     Cervical: No cervical adenopathy.  Skin:    General: Skin is warm and dry.     Capillary Refill: Capillary refill takes less than 2 seconds.     Coloration: Skin is not jaundiced or pale.     Findings: No bruising, erythema, lesion or rash.  Neurological:     General: No focal deficit present.     Mental Status: She is alert. Mental status is at baseline.  Psychiatric:        Mood and Affect: Mood normal.        Behavior: Behavior normal.        Thought Content: Thought content normal.        Judgment: Judgment normal.    Musculoskeletal:  Exam found Decreased ROM, Tissue texture changes, Tenderness to palpation, and Asymmetry of patient's  head, neck, thorax, ribs, lumbar, pelvis, sacrum, and abdomen Osteopathic Structural  Exam:   Head: OAESSR, hypertonic suboccipital muscles  Neck: C3ESRR, C4ESRL, SCM hypertonic on the R  Thorax: T3-6 SLRR, trap spasm on the R  Ribs: Ribs 5-8 locked up on the R  Lumbar: QL hypertonic on the R, L3-5SLRR  Pelvis: Posterior R innominate  Sacrum: R on R torsion  Abdomen: diaphragm hypertonic bilaterally R>L  Results for orders placed or performed in visit on 12/09/22  CULTURE, URINE COMPREHENSIVE   Specimen: Urine   UR  Result Value Ref Range   Urine Culture, Comprehensive Final report    Organism ID, Bacteria Comment   Microscopic Examination   Urine  Result Value Ref Range   WBC, UA 6-10 (A) 0 - 5 /hpf   RBC, Urine 0-2 0 - 2 /hpf   Epithelial Cells (non renal) 0-10 0 - 10 /hpf   Mucus, UA Present (A) Not Estab.   Bacteria, UA Moderate (A) None seen/Few  Urinalysis, Complete  Result Value Ref Range   Specific Gravity, UA >1.030 (H) 1.005 - 1.030   pH, UA 5.5 5.0 - 7.5   Color, UA Yellow Yellow   Appearance Ur Clear Clear   Leukocytes,UA Negative Negative   Protein,UA Trace (A) Negative/Trace   Glucose, UA Negative Negative   Ketones, UA Trace (A) Negative   RBC, UA Trace (A) Negative   Bilirubin, UA Negative Negative   Urobilinogen, Ur 1.0 0.2 - 1.0 mg/dL   Nitrite, UA Negative Negative   Microscopic Examination See below:       Assessment & Plan:   Problem List Items Addressed This Visit   None Visit Diagnoses     Acute right-sided low back pain without sciatica    -  Primary   In exacerbation. She does have somatic dysfunction that is contributring to her symptoms. Treated today with good results as below. Call with concerns.   Head region somatic dysfunction       Cervical segment dysfunction       Thoracic segment dysfunction       Somatic dysfunction of lumbar region       Somatic dysfunction of sacral region       Somatic dysfunction of pelvis region       Rib cage region somatic dysfunction       Segmental dysfunction of abdomen       Need  for vaccination for pneumococcus       Prevnar 20 given today.   Relevant Orders   Pneumococcal conjugate vaccine 20-valent (Prevnar 20)  After verbal consent was obtained, patient was treated today with osteopathic manipulative medicine to the regions of the head, neck, thorax, ribs, lumbar, pelvis, sacrum, and abdomen using the techniques of myofascial release, counterstrain, muscle energy, HVLA, and soft tissue. Areas of compensation relating to her primary pain source also treated. Patient tolerated the procedure well with good objective and good subjective improvement in symptoms. She left the room in good condition. She was advised to stay well hydrated and that she may have some soreness following the procedure. If not improving or worsening, she will call and come in. She will return for reevaluation  In 3-4 weeks.   Follow up plan: Return As able for Welcome to Medicare, 1 month 40 min with me.

## 2023-02-13 ENCOUNTER — Other Ambulatory Visit: Payer: Self-pay | Admitting: Family Medicine

## 2023-02-14 ENCOUNTER — Ambulatory Visit: Payer: Medicare PPO | Admitting: Family Medicine

## 2023-02-14 NOTE — Telephone Encounter (Signed)
Requested Prescriptions  Pending Prescriptions Disp Refills   fluticasone (FLONASE) 50 MCG/ACT nasal spray [Pharmacy Med Name: FLUTICASONE PROP 50 MCG SPRAY] 48 mL 1    Sig: SPRAY 1 SPRAY INTO EACH NOSTRIL EVERY DAY     Ear, Nose, and Throat: Nasal Preparations - Corticosteroids Passed - 02/13/2023 10:14 AM      Passed - Valid encounter within last 12 months    Recent Outpatient Visits           3 weeks ago Acute right-sided low back pain without sciatica   [place] [facility] [place], Megan P, DO   1 month ago Needs flu shot   [place] [facility] [place], Sherran Needs, NP   1 month ago Impacted cerumen of right ear [H61.21]   [place] [facility] [place], Megan P, DO   2 months ago Bilateral impacted cerumen   [place] [facility] [place], Leggett T, NP   3 months ago IFG (impaired fasting glucose)   [place] [facility] Dorcas Carrow, DO       Future Appointments             In 2 months Laural Benes, Oralia Rud, DO [place] [facility]

## 2023-02-15 DIAGNOSIS — M4716 Other spondylosis with myelopathy, lumbar region: Secondary | ICD-10-CM | POA: Diagnosis not present

## 2023-02-21 DIAGNOSIS — M4716 Other spondylosis with myelopathy, lumbar region: Secondary | ICD-10-CM | POA: Diagnosis not present

## 2023-02-27 DIAGNOSIS — M4716 Other spondylosis with myelopathy, lumbar region: Secondary | ICD-10-CM | POA: Diagnosis not present

## 2023-02-28 ENCOUNTER — Encounter: Payer: Self-pay | Admitting: Family Medicine

## 2023-02-28 ENCOUNTER — Ambulatory Visit: Payer: Medicare PPO | Admitting: Family Medicine

## 2023-02-28 VITALS — BP 108/82 | HR 76 | Temp 97.9°F | Resp 14 | Wt 217.8 lb

## 2023-02-28 DIAGNOSIS — M9901 Segmental and somatic dysfunction of cervical region: Secondary | ICD-10-CM

## 2023-02-28 DIAGNOSIS — M9903 Segmental and somatic dysfunction of lumbar region: Secondary | ICD-10-CM | POA: Diagnosis not present

## 2023-02-28 DIAGNOSIS — M9909 Segmental and somatic dysfunction of abdomen and other regions: Secondary | ICD-10-CM

## 2023-02-28 DIAGNOSIS — M9904 Segmental and somatic dysfunction of sacral region: Secondary | ICD-10-CM | POA: Diagnosis not present

## 2023-02-28 DIAGNOSIS — M9900 Segmental and somatic dysfunction of head region: Secondary | ICD-10-CM

## 2023-02-28 DIAGNOSIS — M9908 Segmental and somatic dysfunction of rib cage: Secondary | ICD-10-CM

## 2023-02-28 DIAGNOSIS — M9902 Segmental and somatic dysfunction of thoracic region: Secondary | ICD-10-CM | POA: Diagnosis not present

## 2023-02-28 DIAGNOSIS — M9906 Segmental and somatic dysfunction of lower extremity: Secondary | ICD-10-CM

## 2023-02-28 DIAGNOSIS — M9905 Segmental and somatic dysfunction of pelvic region: Secondary | ICD-10-CM

## 2023-02-28 DIAGNOSIS — M545 Low back pain, unspecified: Secondary | ICD-10-CM | POA: Diagnosis not present

## 2023-02-28 NOTE — Progress Notes (Signed)
BP 108/82 (BP Location: Right Arm, Patient Position: Sitting, Cuff Size: Large)   Pulse 76   Temp 97.9 F (36.6 C) (Oral)   Resp 14   Wt 217 lb 12.8 oz (98.8 kg)   LMP  (LMP Unknown)   SpO2 97%   BMI 36.29 kg/m    Subjective:    Patient ID: Tracie Sheppard, female    DOB: Sep 02, 1955, 67 y.o.   MRN: [id number]  HPI: Tracie Sheppard is a 67 y.o. female  Chief Complaint  Patient presents with   Back Pain   Has been going to PT. Feels like back is doing a better. She notes that she still feels tight and aching. Pain is located in her low back usually R>L and will radiate into her legs. OMT and PT makes it better, but PT makes it worse when she does it. She is otherwise doing well with no other concerns or complaints today although she is worried about her parents.   Relevant past medical, surgical, family and social history reviewed and updated as indicated. Interim medical history since our last visit reviewed. Allergies and medications reviewed and updated.  Review of Systems  Constitutional: Negative.   Respiratory: Negative.    Cardiovascular: Negative.   Musculoskeletal:  Positive for back pain and myalgias. Negative for arthralgias, gait problem, joint swelling, neck pain and neck stiffness.  Skin: Negative.   Neurological: Negative.   Psychiatric/Behavioral: Negative.      Per HPI unless specifically indicated above     Objective:    BP 108/82 (BP Location: Right Arm, Patient Position: Sitting, Cuff Size: Large)   Pulse 76   Temp 97.9 F (36.6 C) (Oral)   Resp 14   Wt 217 lb 12.8 oz (98.8 kg)   LMP  (LMP Unknown)   SpO2 97%   BMI 36.29 kg/m   Wt Readings from Last 3 Encounters:  02/28/23 217 lb 12.8 oz (98.8 kg)  01/24/23 212 lb 9.6 oz (96.4 kg)  01/03/23 216 lb 3.2 oz (98.1 kg)    Physical Exam Vitals and nursing note reviewed.  Constitutional:      General: She is not in acute distress.    Appearance: Normal appearance. She is not ill-appearing.   HENT:     Head: Normocephalic and atraumatic.     Right Ear: External ear normal.     Left Ear: External ear normal.     Nose: Nose normal.     Mouth/Throat:     Mouth: Mucous membranes are moist.     Pharynx: Oropharynx is clear.  Eyes:     Extraocular Movements: Extraocular movements intact.     Conjunctiva/sclera: Conjunctivae normal.     Pupils: Pupils are equal, round, and reactive to light.  Neck:     Vascular: No carotid bruit.  Cardiovascular:     Rate and Rhythm: Normal rate.     Pulses: Normal pulses.  Pulmonary:     Effort: Pulmonary effort is normal. No respiratory distress.  Abdominal:     General: Abdomen is flat. There is no distension.     Palpations: Abdomen is soft. There is no mass.     Tenderness: There is no abdominal tenderness. There is no right CVA tenderness, left CVA tenderness, guarding or rebound.     Hernia: No hernia is present.  Musculoskeletal:     Cervical back: No muscular tenderness.  Lymphadenopathy:     Cervical: No cervical adenopathy.  Skin:    General:  Skin is warm and dry.     Capillary Refill: Capillary refill takes less than 2 seconds.     Coloration: Skin is not jaundiced or pale.     Findings: No bruising, erythema, lesion or rash.  Neurological:     General: No focal deficit present.     Mental Status: She is alert. Mental status is at baseline.  Psychiatric:        Mood and Affect: Mood normal.        Behavior: Behavior normal.        Thought Content: Thought content normal.        Judgment: Judgment normal.   Musculoskeletal:  Exam found Decreased ROM, Tissue texture changes, Tenderness to palpation, and Asymmetry of patient's  head, neck, thorax, ribs, lumbar, pelvis, sacrum, lower extremity, and abdomen Osteopathic Structural Exam:   Head: OAESSR, hypertonic suboccipital muscles  Neck: C4ESRR, SCM hypertonic on the R  Thorax: T3-5SRRL  Ribs:  Ribs 6-8 locked up on the L and rib 5 locked up on the R  Lumbar: QL  hypertonic on the L, L3-5SRRL  Pelvis: Posterior L innominate  Sacrum: L on L torsion  Lower Extremity: IT bands hypertonic bilaterally L>R, posterior fibular head on the L  Abdomen: diaphragm hypertonic bilaterally L>R   Results for orders placed or performed in visit on 12/09/22  CULTURE, URINE COMPREHENSIVE   Specimen: Urine   UR  Result Value Ref Range   Urine Culture, Comprehensive Final report    Organism ID, Bacteria Comment   Microscopic Examination   Urine  Result Value Ref Range   WBC, UA 6-10 (A) 0 - 5 /hpf   RBC, Urine 0-2 0 - 2 /hpf   Epithelial Cells (non renal) 0-10 0 - 10 /hpf   Mucus, UA Present (A) Not Estab.   Bacteria, UA Moderate (A) None seen/Few  Urinalysis, Complete  Result Value Ref Range   Specific Gravity, UA >1.030 (H) 1.005 - 1.030   pH, UA 5.5 5.0 - 7.5   Color, UA Yellow Yellow   Appearance Ur Clear Clear   Leukocytes,UA Negative Negative   Protein,UA Trace (A) Negative/Trace   Glucose, UA Negative Negative   Ketones, UA Trace (A) Negative   RBC, UA Trace (A) Negative   Bilirubin, UA Negative Negative   Urobilinogen, Ur 1.0 0.2 - 1.0 mg/dL   Nitrite, UA Negative Negative   Microscopic Examination See below:       Assessment & Plan:   Problem List Items Addressed This Visit   None Visit Diagnoses     Acute right-sided low back pain without sciatica    -  Primary   Improving with PT. She does have somatic dysfunction that is contributing to her symptoms. Treated today with good results as below. Call with any concerns.   Head region somatic dysfunction       Cervical segment dysfunction       Thoracic segment dysfunction       Somatic dysfunction of lumbar region       Somatic dysfunction of sacral region       Somatic dysfunction of pelvis region       Rib cage region somatic dysfunction       Segmental dysfunction of abdomen       Somatic dysfunction of lower extremities          After verbal consent was obtained, patient was  treated today with osteopathic manipulative medicine to the regions of the head,  neck, thorax, ribs, lumbar, pelvis, sacrum, abdomen, and lower extremity using the techniques of cranial, myofascial release, counterstrain, muscle energy, HVLA, and soft tissue. Areas of compensation relating to her primary pain source also treated. Patient tolerated the procedure well with good objective and good subjective improvement in symptoms. She left the room in good condition. She was advised to stay well hydrated and that she may have some soreness following the procedure. If not improving or worsening, she will call and come in. She will return for reevaluation  on a PRN basis.   Follow up plan: Return for As scheduled.

## 2023-03-07 DIAGNOSIS — M4716 Other spondylosis with myelopathy, lumbar region: Secondary | ICD-10-CM | POA: Diagnosis not present

## 2023-03-17 ENCOUNTER — Encounter: Payer: Self-pay | Admitting: Family Medicine

## 2023-03-17 ENCOUNTER — Ambulatory Visit: Payer: Medicare PPO | Admitting: Family Medicine

## 2023-03-17 ENCOUNTER — Ambulatory Visit: Payer: Self-pay | Admitting: *Deleted

## 2023-03-17 VITALS — BP 144/81 | HR 75 | Temp 98.3°F | Wt 215.4 lb

## 2023-03-17 DIAGNOSIS — J209 Acute bronchitis, unspecified: Secondary | ICD-10-CM | POA: Diagnosis not present

## 2023-03-17 MED ORDER — DOXYCYCLINE HYCLATE 100 MG PO TABS: 100.0000 mg | ORAL_TABLET | Freq: Two times a day (BID) | ORAL | 0 refills | Status: DC

## 2023-03-17 MED ORDER — PREDNISONE 10 MG PO TABS: ORAL_TABLET | ORAL | 0 refills | Status: DC

## 2023-03-17 NOTE — Progress Notes (Signed)
BP (!) 144/81   Pulse 75   Temp 98.3 F (36.8 C) (Oral)   Wt 215 lb 6.4 oz (97.7 kg)   LMP  (LMP Unknown)   SpO2 98%   BMI 35.89 kg/m    Subjective:    Patient ID: Tracie Sheppard, female    DOB: 1956-03-07, 67 y.o.   MRN: [id number]  HPI: Tracie Sheppard is a 67 y.o. female  Chief Complaint  Patient presents with   Wheezing    Patient says she has noticed some really bad wheezing.   Sinusitis   Headache   Cough    Patient says she has been symptomatic for almost two weeks. Patient says been around her family he was recently sick. Patient says she recently kept her grandson, who was diagnosed with Bronchitis. Patient says she has been taking Mucinex DM every 12 hrs and Tylenol. Patient says she is coughing up a yellow clumps of phlegm.   UPPER RESPIRATORY TRACT INFECTION Duration: almost 2 weeks Worst symptom: cough, congestion Fever: no Cough: yes Shortness of breath: no Wheezing: yes Chest pain: yes, with cough Chest tightness: yes Chest congestion: yes Nasal congestion: yes Runny nose: yes Post nasal drip: yes Sneezing: no Sore throat: no Swollen glands: no Sinus pressure: yes Headache: yes Face pain: no Toothache: yes Ear pain:  no Ear pressure: yes bilateral Eyes red/itching:no Eye drainage/crusting: no  Vomiting: no Rash: no Fatigue: yes Sick contacts: yes Strep contacts: no  Context: worse Recurrent sinusitis: no Relief with OTC cold/cough medications: no  Treatments attempted: mucinex and pseudoephedrine   Relevant past medical, surgical, family and social history reviewed and updated as indicated. Interim medical history since our last visit reviewed. Allergies and medications reviewed and updated.  Review of Systems  Constitutional:  Positive for fatigue and fever. Negative for activity change, appetite change, chills, diaphoresis and unexpected weight change.  HENT:  Positive for congestion, postnasal drip, sinus pressure, sinus pain and  sore throat. Negative for dental problem, drooling, ear discharge, ear pain, facial swelling, hearing loss, mouth sores, nosebleeds, rhinorrhea, sneezing, tinnitus, trouble swallowing and voice change.   Respiratory:  Positive for cough, chest tightness and wheezing. Negative for apnea, choking, shortness of breath and stridor.   Cardiovascular: Negative.   Gastrointestinal: Negative.   Psychiatric/Behavioral: Negative.      Per HPI unless specifically indicated above     Objective:    BP (!) 144/81   Pulse 75   Temp 98.3 F (36.8 C) (Oral)   Wt 215 lb 6.4 oz (97.7 kg)   LMP  (LMP Unknown)   SpO2 98%   BMI 35.89 kg/m   Wt Readings from Last 3 Encounters:  03/17/23 215 lb 6.4 oz (97.7 kg)  02/28/23 217 lb 12.8 oz (98.8 kg)  01/24/23 212 lb 9.6 oz (96.4 kg)    Physical Exam Vitals and nursing note reviewed.  Constitutional:      General: She is not in acute distress.    Appearance: Normal appearance. She is not ill-appearing, toxic-appearing or diaphoretic.  HENT:     Head: Normocephalic and atraumatic.     Right Ear: Tympanic membrane, ear canal and external ear normal. There is no impacted cerumen.     Left Ear: Tympanic membrane, ear canal and external ear normal. There is no impacted cerumen.     Nose: Congestion and rhinorrhea present.     Mouth/Throat:     Mouth: Mucous membranes are moist.     Pharynx: Oropharynx  is clear. No oropharyngeal exudate or posterior oropharyngeal erythema.  Eyes:     General: No scleral icterus.       Right eye: Discharge present.        Left eye: No discharge.     Extraocular Movements: Extraocular movements intact.     Conjunctiva/sclera: Conjunctivae normal.     Pupils: Pupils are equal, round, and reactive to light.  Neck:     Vascular: No carotid bruit.  Cardiovascular:     Rate and Rhythm: Normal rate and regular rhythm.     Pulses: Normal pulses.     Heart sounds: Normal heart sounds. No murmur heard.    No friction rub. No  gallop.  Pulmonary:     Effort: Pulmonary effort is normal. No respiratory distress.     Breath sounds: No stridor. Wheezing present. No rhonchi or rales.  Chest:     Chest wall: No tenderness.  Musculoskeletal:        General: Normal range of motion.     Cervical back: Normal range of motion and neck supple. No rigidity. No muscular tenderness.  Lymphadenopathy:     Cervical: No cervical adenopathy.  Skin:    General: Skin is warm and dry.     Capillary Refill: Capillary refill takes less than 2 seconds.     Coloration: Skin is not jaundiced or pale.     Findings: No bruising, erythema, lesion or rash.  Neurological:     General: No focal deficit present.     Mental Status: She is alert and oriented to person, place, and time. Mental status is at baseline.     Cranial Nerves: No cranial nerve deficit.     Sensory: No sensory deficit.     Motor: No weakness.     Coordination: Coordination normal.     Gait: Gait normal.     Deep Tendon Reflexes: Reflexes normal.  Psychiatric:        Mood and Affect: Mood normal.        Behavior: Behavior normal.        Thought Content: Thought content normal.        Judgment: Judgment normal.     Results for orders placed or performed in visit on 12/09/22  Microscopic Examination   Collection Time: 12/09/22  2:35 PM   Urine  Result Value Ref Range   WBC, UA 6-10 (A) 0 - 5 /hpf   RBC, Urine 0-2 0 - 2 /hpf   Epithelial Cells (non renal) 0-10 0 - 10 /hpf   Mucus, UA Present (A) Not Estab.   Bacteria, UA Moderate (A) None seen/Few  Urinalysis, Complete   Collection Time: 12/09/22  2:35 PM  Result Value Ref Range   Specific Gravity, UA >1.030 (H) 1.005 - 1.030   pH, UA 5.5 5.0 - 7.5   Color, UA Yellow Yellow   Appearance Ur Clear Clear   Leukocytes,UA Negative Negative   Protein,UA Trace (A) Negative/Trace   Glucose, UA Negative Negative   Ketones, UA Trace (A) Negative   RBC, UA Trace (A) Negative   Bilirubin, UA Negative Negative    Urobilinogen, Ur 1.0 0.2 - 1.0 mg/dL   Nitrite, UA Negative Negative   Microscopic Examination See below:   CULTURE, URINE COMPREHENSIVE   Collection Time: 12/09/22  2:49 PM   Specimen: Urine   UR  Result Value Ref Range   Urine Culture, Comprehensive Final report    Organism ID, Bacteria Comment  Assessment & Plan:   Problem List Items Addressed This Visit   None Visit Diagnoses       Acute bronchitis, unspecified organism    -  Primary   Will treat with prednisone and doxycycline. Continue symptomatic care. Call if not getting better or getting worse. Continue to monitor.        Follow up plan: Return for As scheduled.

## 2023-03-17 NOTE — Telephone Encounter (Signed)
  Chief Complaint: sinus congestion, chest congestion- cough Symptoms: wheezing, headache, sinus pressure Frequency: 2 days Pertinent Negatives: Patient denies SOB Disposition: [] ED /[] Urgent Care (no appt availability in office) / [] Appointment(In office/virtual)/ []  [facility]/ [] Home Care/ [x] Refused Recommended Disposition /[] [facility]/ []  Follow-up with PCP Additional Notes: No open appointment- no open appointment with float provider. Offered care option VV/UC- patient declined- she states she is taking to many medications and is concerned about an outside provider prescribing for her.  Advised I would send message to office for her.

## 2023-03-17 NOTE — Telephone Encounter (Signed)
Called and scheduled patient on 03/20/2023 @ 11:00 am, however patient is very concerned considering that she is wheezing.  I did explain to patient that we only have one provider in the office this afternoon.  Please advise.

## 2023-03-17 NOTE — Telephone Encounter (Signed)
Pt is being seen today at 1:00 pm.

## 2023-03-17 NOTE — Telephone Encounter (Signed)
Medication cannot be prescribed until patient has an appointment with a provider. Please call to schedule.

## 2023-03-17 NOTE — Telephone Encounter (Signed)
Reason for Disposition  Lots of coughing  Answer Assessment - Initial Assessment Questions 1. LOCATION: "Where does it hurt?"      Chronic back pain is worse from coughing 2. ONSET: "When did the sinus pain start?"  (e.g., hours, days)      2 days 3. SEVERITY: "How bad is the pain?"   (Scale 1-10; mild, moderate or severe)   - MILD (1-3): doesn't interfere with normal activities    - MODERATE (4-7): interferes with normal activities (e.g., work or school) or awakens from sleep   - SEVERE (8-10): excruciating pain and patient unable to do any normal activities        mild 4. RECURRENT SYMPTOM: "Have you ever had sinus problems before?" If Yes, ask: "When was the last time?" and "What happened that time?"      Yes- sputum- yellow/green 5. NASAL CONGESTION: "Is the nose blocked?" If Yes, ask: "Can you open it or must you breathe through your mouth?"     Sometimes mouth breathing- noe stopped on one side 6. NASAL DISCHARGE: "Do you have discharge from your nose?" If so ask, "What color?"     Clear- lots of it 7. FEVER: "Do you have a fever?" If Yes, ask: "What is it, how was it measured, and when did it start?"      No- maybe first night of symptoms-99 8. OTHER SYMPTOMS: "Do you have any other symptoms?" (e.g., sore throat, cough, earache, difficulty breathing)     Wheezing, cough, sore throat, left ear feels clogged  Protocols used: Sinus Pain or Congestion-A-AH

## 2023-03-17 NOTE — Telephone Encounter (Signed)
I can see her at 1 PM

## 2023-03-20 ENCOUNTER — Ambulatory Visit: Payer: Medicare PPO | Admitting: Family Medicine

## 2023-03-22 ENCOUNTER — Ambulatory Visit: Payer: Medicare PPO | Admitting: Podiatry

## 2023-03-23 DIAGNOSIS — M4716 Other spondylosis with myelopathy, lumbar region: Secondary | ICD-10-CM | POA: Diagnosis not present

## 2023-04-07 DIAGNOSIS — M4716 Other spondylosis with myelopathy, lumbar region: Secondary | ICD-10-CM | POA: Diagnosis not present

## 2023-04-11 ENCOUNTER — Encounter: Payer: Self-pay | Admitting: Family Medicine

## 2023-04-11 ENCOUNTER — Ambulatory Visit: Payer: Medicare PPO | Admitting: Family Medicine

## 2023-04-11 VITALS — BP 132/85 | HR 88 | Wt 219.6 lb

## 2023-04-11 DIAGNOSIS — M5441 Lumbago with sciatica, right side: Secondary | ICD-10-CM | POA: Diagnosis not present

## 2023-04-11 DIAGNOSIS — M9908 Segmental and somatic dysfunction of rib cage: Secondary | ICD-10-CM | POA: Diagnosis not present

## 2023-04-11 DIAGNOSIS — M9900 Segmental and somatic dysfunction of head region: Secondary | ICD-10-CM | POA: Diagnosis not present

## 2023-04-11 DIAGNOSIS — M9901 Segmental and somatic dysfunction of cervical region: Secondary | ICD-10-CM

## 2023-04-11 DIAGNOSIS — G8929 Other chronic pain: Secondary | ICD-10-CM

## 2023-04-11 DIAGNOSIS — M9903 Segmental and somatic dysfunction of lumbar region: Secondary | ICD-10-CM

## 2023-04-11 DIAGNOSIS — M9905 Segmental and somatic dysfunction of pelvic region: Secondary | ICD-10-CM | POA: Diagnosis not present

## 2023-04-11 DIAGNOSIS — M9909 Segmental and somatic dysfunction of abdomen and other regions: Secondary | ICD-10-CM

## 2023-04-11 DIAGNOSIS — M9904 Segmental and somatic dysfunction of sacral region: Secondary | ICD-10-CM

## 2023-04-11 DIAGNOSIS — M25571 Pain in right ankle and joints of right foot: Secondary | ICD-10-CM | POA: Diagnosis not present

## 2023-04-11 DIAGNOSIS — M9902 Segmental and somatic dysfunction of thoracic region: Secondary | ICD-10-CM

## 2023-04-11 NOTE — Progress Notes (Signed)
 BP 132/85   Pulse 88   Wt 219 lb 9.6 oz (99.6 kg)   LMP  (LMP Unknown)   SpO2 97%   BMI 36.59 kg/m    Subjective:    Patient ID: Tracie Sheppard, female    DOB: [birth year], 68 y.o.   MRN: [id number]  HPI: Tracie Sheppard is a 68 y.o. female  Chief Complaint  Patient presents with   Back Pain   BACK PAIN- has days where she is feeling better with PT, but other days when her back is really acting up.  Duration: chronic Mechanism of injury: no trauma Location: bilateral and low back Onset: gradual Severity: moderate Quality: shooting, aching, sore Frequency: constant Radiation: down R leg Aggravating factors: certain movements, washing dishes Alleviating factors: OMM, medicine, PT some of the time Status: stable Treatments attempted: rest, ice, heat, APAP, ibuprofen, aleve , physical therapy, HEP, and OMM  Relief with NSAIDs?: mild Nighttime pain:  no Paresthesias / decreased sensation:  no Bowel / bladder incontinence:  no Fevers:  no Dysuria / urinary frequency:  no  Relevant past medical, surgical, family and social history reviewed and updated as indicated. Interim medical history since our last visit reviewed. Allergies and medications reviewed and updated.  Review of Systems  Constitutional: Negative.   Respiratory: Negative.    Cardiovascular: Negative.   Musculoskeletal:  Positive for back pain and myalgias. Negative for arthralgias, gait problem, joint swelling, neck pain and neck stiffness.  Skin: Negative.   Neurological: Negative.   Psychiatric/Behavioral: Negative.      Per HPI unless specifically indicated above     Objective:    BP 132/85   Pulse 88   Wt 219 lb 9.6 oz (99.6 kg)   LMP  (LMP Unknown)   SpO2 97%   BMI 36.59 kg/m   Wt Readings from Last 3 Encounters:  04/11/23 219 lb 9.6 oz (99.6 kg)  03/17/23 215 lb 6.4 oz (97.7 kg)  02/28/23 217 lb 12.8 oz (98.8 kg)    Physical Exam Vitals and nursing note reviewed.  Constitutional:       General: She is not in acute distress.    Appearance: Normal appearance. She is obese. She is not ill-appearing.  HENT:     Head: Normocephalic and atraumatic.     Right Ear: External ear normal.     Left Ear: External ear normal.     Nose: Nose normal.     Mouth/Throat:     Mouth: Mucous membranes are moist.     Pharynx: Oropharynx is clear.  Eyes:     Extraocular Movements: Extraocular movements intact.     Conjunctiva/sclera: Conjunctivae normal.     Pupils: Pupils are equal, round, and reactive to light.  Neck:     Vascular: No carotid bruit.  Cardiovascular:     Rate and Rhythm: Normal rate.     Pulses: Normal pulses.  Pulmonary:     Effort: Pulmonary effort is normal. No respiratory distress.  Abdominal:     General: Abdomen is flat. There is no distension.     Palpations: Abdomen is soft. There is no mass.     Tenderness: There is no abdominal tenderness. There is no right CVA tenderness, left CVA tenderness, guarding or rebound.     Hernia: No hernia is present.  Musculoskeletal:     Cervical back: No muscular tenderness.  Lymphadenopathy:     Cervical: No cervical adenopathy.  Skin:    General: Skin is warm and  dry.     Capillary Refill: Capillary refill takes less than 2 seconds.     Coloration: Skin is not jaundiced or pale.     Findings: No bruising, erythema, lesion or rash.  Neurological:     General: No focal deficit present.     Mental Status: She is alert. Mental status is at baseline.  Psychiatric:        Mood and Affect: Mood normal.        Behavior: Behavior normal.        Thought Content: Thought content normal.        Judgment: Judgment normal.   Musculoskeletal:  Exam found Decreased ROM, Tissue texture changes, Tenderness to palpation, and Asymmetry of patient's  head, neck, thorax, ribs, lumbar, pelvis, sacrum, and abdomen Osteopathic Structural Exam:   Head: hypertonic suboccipital muscles, OAESSR  Neck: traps hypertonic bilaterally  R>L  Thorax: T3-5SRRL  Ribs: Ribs 6-7 locked up on the L, Rib 5 locked up on the R  Lumbar: QL hypertonic bilaterally, psoas hypertonic bilaterally, L3-5SRRL  Pelvis: Posterior R innominate  Sacrum: R on R torsion  Abdomen: diaphragm hypertonic bilaterally L>R  Results for orders placed or performed in visit on 12/09/22  Microscopic Examination   Collection Time: 12/09/22  2:35 PM   Urine  Result Value Ref Range   WBC, UA 6-10 (A) 0 - 5 /hpf   RBC, Urine 0-2 0 - 2 /hpf   Epithelial Cells (non renal) 0-10 0 - 10 /hpf   Mucus, UA Present (A) Not Estab.   Bacteria, UA Moderate (A) None seen/Few  Urinalysis, Complete   Collection Time: 12/09/22  2:35 PM  Result Value Ref Range   Specific Gravity, UA >1.030 (H) 1.005 - 1.030   pH, UA 5.5 5.0 - 7.5   Color, UA Yellow Yellow   Appearance Ur Clear Clear   Leukocytes,UA Negative Negative   Protein,UA Trace (A) Negative/Trace   Glucose, UA Negative Negative   Ketones, UA Trace (A) Negative   RBC, UA Trace (A) Negative   Bilirubin, UA Negative Negative   Urobilinogen, Ur 1.0 0.2 - 1.0 mg/dL   Nitrite, UA Negative Negative   Microscopic Examination See below:   CULTURE, URINE COMPREHENSIVE   Collection Time: 12/09/22  2:49 PM   Specimen: Urine   UR  Result Value Ref Range   Urine Culture, Comprehensive Final report    Organism ID, Bacteria Comment       Assessment & Plan:   Problem List Items Addressed This Visit   None Visit Diagnoses       Chronic bilateral low back pain with right-sided sciatica    -  Primary   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 PT has not helped significantly. Will get her MRI to consider injections. She does have somatic dysfunction. Treated today as below.   Relevant Orders   MR Lumbar Spine Wo Contrast     Chronic pain of right ankle       Podiatry PT referral has expired- new one placed. Call with any concerns.   Relevant Orders   Ambulatory referral to Physical Therapy     Head region somatic dysfunction          Cervical segment dysfunction         Thoracic segment dysfunction         Somatic dysfunction of sacral region         Somatic dysfunction of pelvis region  Rib cage region somatic dysfunction         Segmental dysfunction of abdomen         Somatic dysfunction of lumbar region          After verbal consent was obtained, patient was treated today with osteopathic manipulative medicine to the regions of the head, neck, thorax, ribs, lumbar, pelvis, sacrum, and abdomen using the techniques of cranial, myofascial release, counterstrain, muscle energy, HVLA, and soft tissue. Areas of compensation relating to her primary pain source also treated. Patient tolerated the procedure well with good objective and good subjective improvement in symptoms. She left the room in good condition. She was advised to stay well hydrated and that she may have some soreness following the procedure. If not improving or worsening, she will call and come in. She will return for reevaluation  on a PRN basis.   Follow up plan: Return for As scheduled.

## 2023-04-12 DIAGNOSIS — M4716 Other spondylosis with myelopathy, lumbar region: Secondary | ICD-10-CM | POA: Diagnosis not present

## 2023-04-14 ENCOUNTER — Telehealth: Payer: Self-pay | Admitting: Family Medicine

## 2023-04-14 NOTE — Telephone Encounter (Signed)
 Sueanne with  [facility] states pt has MRI appt on Monday 04/17/2023. Per [employer] pt needs an authorization for MRI & pt currently does not have one.   If additional information is needed Sueanne can be reached out, [phone] ext [phone]

## 2023-04-17 ENCOUNTER — Ambulatory Visit: Admission: RE | Admit: 2023-04-17 | Payer: Medicare PPO | Source: Ambulatory Visit

## 2023-04-20 ENCOUNTER — Ambulatory Visit
Admission: RE | Admit: 2023-04-20 | Discharge: 2023-04-20 | Disposition: A | Discharge status: Home / Self Care | Payer: Medicare PPO | Source: Ambulatory Visit | Attending: Family Medicine | Admitting: Family Medicine

## 2023-04-20 DIAGNOSIS — M5441 Lumbago with sciatica, right side: Secondary | ICD-10-CM | POA: Insufficient documentation

## 2023-04-20 DIAGNOSIS — G8929 Other chronic pain: Secondary | ICD-10-CM | POA: Insufficient documentation

## 2023-04-20 DIAGNOSIS — M4726 Other spondylosis with radiculopathy, lumbar region: Secondary | ICD-10-CM | POA: Diagnosis not present

## 2023-04-20 DIAGNOSIS — M48061 Spinal stenosis, lumbar region without neurogenic claudication: Secondary | ICD-10-CM | POA: Diagnosis not present

## 2023-04-20 DIAGNOSIS — M4727 Other spondylosis with radiculopathy, lumbosacral region: Secondary | ICD-10-CM | POA: Diagnosis not present

## 2023-04-20 DIAGNOSIS — M5116 Intervertebral disc disorders with radiculopathy, lumbar region: Secondary | ICD-10-CM | POA: Diagnosis not present

## 2023-04-21 ENCOUNTER — Ambulatory Visit (INDEPENDENT_AMBULATORY_CARE_PROVIDER_SITE_OTHER): Payer: Medicare PPO | Admitting: Family Medicine

## 2023-04-21 ENCOUNTER — Other Ambulatory Visit (HOSPITAL_COMMUNITY)
Admission: RE | Admit: 2023-04-21 | Discharge: 2023-04-21 | Disposition: A | Discharge status: Home / Self Care | Payer: Medicare PPO | Source: Ambulatory Visit | Attending: Family Medicine | Admitting: Family Medicine

## 2023-04-21 ENCOUNTER — Encounter: Payer: Self-pay | Admitting: Family Medicine

## 2023-04-21 ENCOUNTER — Telehealth: Payer: Self-pay

## 2023-04-21 VITALS — BP 144/83 | HR 83 | Temp 98.1°F | Ht 65.0 in | Wt 220.8 lb

## 2023-04-21 DIAGNOSIS — Z780 Asymptomatic menopausal state: Secondary | ICD-10-CM

## 2023-04-21 DIAGNOSIS — M791 Myalgia, unspecified site: Secondary | ICD-10-CM

## 2023-04-21 DIAGNOSIS — Z0000 Encounter for general adult medical examination without abnormal findings: Secondary | ICD-10-CM

## 2023-04-21 DIAGNOSIS — Z7189 Other specified counseling: Secondary | ICD-10-CM | POA: Insufficient documentation

## 2023-04-21 DIAGNOSIS — F331 Major depressive disorder, recurrent, moderate: Secondary | ICD-10-CM

## 2023-04-21 DIAGNOSIS — Z124 Encounter for screening for malignant neoplasm of cervix: Secondary | ICD-10-CM | POA: Diagnosis present

## 2023-04-21 DIAGNOSIS — E785 Hyperlipidemia, unspecified: Secondary | ICD-10-CM | POA: Diagnosis not present

## 2023-04-21 DIAGNOSIS — I10 Essential (primary) hypertension: Secondary | ICD-10-CM

## 2023-04-21 DIAGNOSIS — Z01419 Encounter for gynecological examination (general) (routine) without abnormal findings: Secondary | ICD-10-CM | POA: Diagnosis not present

## 2023-04-21 DIAGNOSIS — Z1231 Encounter for screening mammogram for malignant neoplasm of breast: Secondary | ICD-10-CM

## 2023-04-21 DIAGNOSIS — T466X5A Adverse effect of antihyperlipidemic and antiarteriosclerotic drugs, initial encounter: Secondary | ICD-10-CM

## 2023-04-21 DIAGNOSIS — Z136 Encounter for screening for cardiovascular disorders: Secondary | ICD-10-CM

## 2023-04-21 DIAGNOSIS — E782 Mixed hyperlipidemia: Secondary | ICD-10-CM

## 2023-04-21 DIAGNOSIS — Z1211 Encounter for screening for malignant neoplasm of colon: Secondary | ICD-10-CM

## 2023-04-21 DIAGNOSIS — R7301 Impaired fasting glucose: Secondary | ICD-10-CM

## 2023-04-21 DIAGNOSIS — Z1151 Encounter for screening for human papillomavirus (HPV): Secondary | ICD-10-CM | POA: Diagnosis not present

## 2023-04-21 LAB — MICROALBUMIN, URINE WAIVED
Creatinine, Urine Waived: 300 mg/dL (ref 10–300)
Microalb, Ur Waived: 80 mg/L — ABNORMAL HIGH (ref 0–19)

## 2023-04-21 LAB — BAYER DCA HB A1C WAIVED: HB A1C (BAYER DCA - WAIVED): 6.1 % — ABNORMAL HIGH (ref 4.8–5.6)

## 2023-04-21 MED ORDER — SPIRONOLACTONE 100 MG PO TABS: 100.0000 mg | ORAL_TABLET | Freq: Two times a day (BID) | ORAL | 1 refills | Status: DC

## 2023-04-21 MED ORDER — CLONAZEPAM 0.5 MG PO TABS: ORAL_TABLET | ORAL | 0 refills | Status: DC

## 2023-04-21 MED ORDER — FLUTICASONE PROPIONATE 50 MCG/ACT NA SUSP: NASAL | 1 refills | Status: AC

## 2023-04-21 MED ORDER — PROPRANOLOL HCL ER 60 MG PO CP24: 60.0000 mg | ORAL_CAPSULE | Freq: Every day | ORAL | 1 refills | Status: DC

## 2023-04-21 MED ORDER — CETIRIZINE HCL 10 MG PO TABS: 10.0000 mg | ORAL_TABLET | Freq: Every day | ORAL | 3 refills | Status: AC | PRN

## 2023-04-21 MED ORDER — MELOXICAM 15 MG PO TABS: 15.0000 mg | ORAL_TABLET | Freq: Every day | ORAL | 3 refills | Status: DC

## 2023-04-21 MED ORDER — CITALOPRAM HYDROBROMIDE 40 MG PO TABS: 60.0000 mg | ORAL_TABLET | Freq: Every day | ORAL | 1 refills | Status: DC

## 2023-04-21 MED ORDER — BUPROPION HCL ER (XL) 300 MG PO TB24: 300.0000 mg | ORAL_TABLET | Freq: Every day | ORAL | 1 refills | Status: DC

## 2023-04-21 NOTE — Assessment & Plan Note (Signed)
Under good control on current regimen. Continue current regimen. Continue to monitor. Call with any concerns. Refills given. Labs drawn today.   

## 2023-04-21 NOTE — Telephone Encounter (Signed)
Spoke with [facility] and was informed that they just needed a DEXA scan order faxed over to add to the patient current scheduled appointment. Patient is scheduled for upcoming Mammogram 11/06/2023.

## 2023-04-21 NOTE — Assessment & Plan Note (Signed)
A voluntary discussion about advance care planning including the explanation and discussion of advance directives was extensively discussed  with the patient for 10 minutes with patient present.  Explanation about the health care proxy and Living will was reviewed and packet with forms with explanation of how to fill them out was given.  During this discussion, the patient was able to identify a health care proxy as her daughters and has filled out the paperwork required.  Patient was offered a separate Advance Care Planning visit for further assistance with forms.

## 2023-04-21 NOTE — Telephone Encounter (Signed)
-----   Message from Olevia Perches sent at 04/21/2023 10:03 AM EST ----- Mammo and Dexa to [facility] please- may already have mammo scheduled but would need DEXA added

## 2023-04-21 NOTE — Progress Notes (Signed)
BP (!) 144/83   Pulse 83   Temp 98.1 F (36.7 C)   Ht 5\' 5"  (1.651 m)   Wt 220 lb 12.8 oz (100.2 kg)   LMP  (LMP Unknown)   SpO2 97%   BMI 36.74 kg/m    Subjective:    Patient ID: Tracie Sheppard, female    DOB: 02-Jul-1955, 68 y.o.   MRN: [id number]  HPI: KATRYN BABY is a 68 y.o. female presenting on 04/21/2023 for comprehensive medical examination. Current medical complaints include:  Impaired Fasting Glucose HbA1C:  Lab Results  Component Value Date   HGBA1C 6.1 (H) 04/21/2023   Duration of elevated blood sugar: chronic Polydipsia: no Polyuria: no Weight change: no Visual disturbance: no Glucose Monitoring: no Diabetic Education: Not Completed Family history of diabetes: yes  HYPERTENSION / HYPERLIPIDEMIA Satisfied with current treatment? yes Duration of hypertension: chronic BP monitoring frequency: not checking BP medication side effects: no Past BP meds: propranolol, spironalactone Duration of hyperlipidemia: chronic Cholesterol medication side effects: yes Cholesterol supplements: none Medication compliance: fair compliance Aspirin: no Recent stressors: no Recurrent headaches: no Visual changes: no Palpitations: no Dyspnea: no Chest pain: no Lower extremity edema: no Dizzy/lightheaded: no  ANXIETY/DEPRESSION Duration: chronic Status:stable Anxious mood: yes  Excessive worrying: no Irritability: no  Sweating: no Nausea: no Palpitations:no Hyperventilation: no Panic attacks: no Agoraphobia: no  Obscessions/compulsions: no Depressed mood: yes    04/21/2023    9:30 AM 01/03/2023    1:43 PM 10/19/2022   10:35 AM 08/22/2022    2:16 PM 05/10/2022   12:22 PM  Depression screen PHQ 2/9  Decreased Interest 1 1 0 1   Down, Depressed, Hopeless 1 1  1    PHQ - 2 Score 2 2 0 2   Altered sleeping 3 3 3 3 2   Tired, decreased energy 2 3 1 2 2   Change in appetite 3 3 3 3 3   Feeling bad or failure about yourself  1 1 0 0 1  Trouble concentrating 1 0 1  0 0  Moving slowly or fidgety/restless 0 0 0 0 0  Suicidal thoughts 0 0 0 0   PHQ-9 Score 12 12 8 10    Difficult doing work/chores Somewhat difficult Somewhat difficult  Somewhat difficult       04/21/2023    9:30 AM 01/03/2023    1:44 PM 10/19/2022   10:35 AM 08/22/2022    2:16 PM  GAD 7 : Generalized Anxiety Score  Nervous, Anxious, on Edge 1 1 1 1   Control/stop worrying 1  1 1   Worry too much - different things 1 0 1 1  Trouble relaxing 0 0 0 0  Restless 0 0 0 0  Easily annoyed or irritable 1 1 1 1   Afraid - awful might happen 0 0 0 0  Total GAD 7 Score 4  4 4   Anxiety Difficulty Somewhat difficult      Anhedonia: no Weight changes: no Insomnia: no   Hypersomnia: no Fatigue/loss of energy: yes Feelings of worthlessness: no Feelings of guilt: no Impaired concentration/indecisiveness: no Suicidal ideations: no  Crying spells: no Recent Stressors/Life Changes: yes   Relationship problems: no   Family stress: yes     Financial stress: no    Job stress: no    Recent death/loss: no  She currently lives with: husband Menopausal Symptoms: no  Functional Status Survey: Is the patient deaf or have difficulty hearing?: No Does the patient have difficulty seeing, even when wearing glasses/contacts?:  No Does the patient have difficulty concentrating, remembering, or making decisions?: Yes Does the patient have difficulty walking or climbing stairs?: Yes Does the patient have difficulty dressing or bathing?: No Does the patient have difficulty doing errands alone such as visiting a doctor's office or shopping?: No     04/21/2023    9:30 AM 10/19/2022   10:34 AM 08/22/2022    2:15 PM 04/15/2022    9:55 AM 03/30/2022   11:24 AM  Fall Risk   Falls in the past year? 0 1 1 0 0  Number falls in past yr: 0 1 1 0 0  Injury with Fall? 0 0 1 0 0  Risk for fall due to : No Fall Risks  History of fall(s) No Fall Risks No Fall Risks  Follow up Falls evaluation completed  Falls  evaluation completed Falls evaluation completed Falls evaluation completed    Depression Screen    04/21/2023    9:30 AM 01/03/2023    1:43 PM 10/19/2022   10:35 AM 08/22/2022    2:16 PM 05/10/2022   12:22 PM  Depression screen PHQ 2/9  Decreased Interest 1 1 0 1   Down, Depressed, Hopeless 1 1  1    PHQ - 2 Score 2 2 0 2   Altered sleeping 3 3 3 3 2   Tired, decreased energy 2 3 1 2 2   Change in appetite 3 3 3 3 3   Feeling bad or failure about yourself  1 1 0 0 1  Trouble concentrating 1 0 1 0 0  Moving slowly or fidgety/restless 0 0 0 0 0  Suicidal thoughts 0 0 0 0   PHQ-9 Score 12 12 8 10    Difficult doing work/chores Somewhat difficult Somewhat difficult  Somewhat difficult      Advanced Directives Does patient have a HCPOA?    yes If yes, name and contact information:  Does patient have a living will or MOST form?  yes  Past Medical History:  Past Medical History:  Diagnosis Date   Allergy    Seasonal   Anxiety    Bilateral carpal tunnel syndrome 05/30/2017   Chronic back pain    Depression, major, recurrent, moderate (HCC)    Diabetes mellitus without complication (HCC)    Prediabetic   High serum testosterone    HTN (hypertension) 09/12/2019   Kidney stone    Sleep apnea    Thyroid disease    did not show up in lab work but per other symptoms previous doctor started her on this    Surgical History:  Past Surgical History:  Procedure Laterality Date   CHOLECYSTECTOMY     1997   COLONOSCOPY WITH PROPOFOL N/A 11/16/2018   Procedure: COLONOSCOPY WITH PROPOFOL;  Surgeon: Toney Reil, MD;  Location: [facility] ENDOSCOPY;  Service: Gastroenterology;  Laterality: N/A;   CYSTOSCOPY  09/03/2021   ESOPHAGOGASTRODUODENOSCOPY (EGD) WITH PROPOFOL N/A 11/16/2018   Procedure: ESOPHAGOGASTRODUODENOSCOPY (EGD) WITH PROPOFOL;  Surgeon: Toney Reil, MD;  Location: [facility] ENDOSCOPY;  Service: Gastroenterology;  Laterality: N/A;   NASAL SEPTUM SURGERY     OOPHORECTOMY      TUBAL LIGATION     1989    Medications:  Current Outpatient Medications on File Prior to Visit  Medication Sig   acetaminophen (TYLENOL) 500 MG tablet Tylenol Extra Strength 500 mg tablet   Alpha-Lipoic Acid 600 MG CAPS Take 600 mg by mouth daily.   Cyanocobalamin 1000 MCG TBCR Take 1,000 mcg by mouth daily.  cyclobenzaprine (FLEXERIL) 10 MG tablet Take 0.5-1 tablets (5-10 mg total) by mouth at bedtime.   ondansetron (ZOFRAN-ODT) 4 MG disintegrating tablet Take 1 tablet (4 mg total) by mouth every 8 (eight) hours as needed for nausea or vomiting.   VITAMIN D PO Take by mouth daily. 1000 mcg   No current facility-administered medications on file prior to visit.    Allergies:  Allergies  Allergen Reactions   Aleve [Naproxen Sodium] Other (See Comments)    Chest pain   Statins Other (See Comments)    myalgias    Social History:  Social History   Socioeconomic History   Marital status: Married    Spouse name: Not on file   Number of children: 1   Years of education: 14   Highest education level: Associate degree: occupational, Scientist, product/process development, or vocational program  Occupational History   Occupation: unemployed  Tobacco Use   Smoking status: Never   Smokeless tobacco: Never  Vaping Use   Vaping status: Never Used  Substance and Sexual Activity   Alcohol use: Yes    Comment: On occasion   Drug use: No   Sexual activity: Yes    Birth control/protection: Post-menopausal, None  Other Topics Concern   Not on file  Social History Narrative   Lives with spouse   Caffeine use: 1-2 drinks per day   Right handed    Social Drivers of Health   Financial Resource Strain: Low Risk  (08/22/2022)   Overall Financial Resource Strain (CARDIA)    Difficulty of Paying Living Expenses: Not hard at all  Food Insecurity: No Food Insecurity (04/21/2023)   Hunger Vital Sign    Worried About Running Out of Food in the Last Year: Never true    Ran Out of Food in the Last Year: Never true   Transportation Needs: No Transportation Needs (04/21/2023)   PRAPARE - Administrator, Civil Service (Medical): No    Lack of Transportation (Non-Medical): No  Physical Activity: Unknown (08/22/2022)   Exercise Vital Sign    Days of Exercise per Week: 0 days    Minutes of Exercise per Session: Not on file  Stress: Stress Concern Present (08/22/2022)   [employer] of Occupational Health - Occupational Stress Questionnaire    Feeling of Stress : Rather much  Social Connections: Socially Integrated (08/22/2022)   Social Connection and Isolation Panel [NHANES]    Frequency of Communication with Friends and Family: More than three times a week    Frequency of Social Gatherings with Friends and Family: Once a week    Attends Religious Services: More than 4 times per year    Active Member of [employer] or Organizations: Yes    Attends Engineer, structural: More than 4 times per year    Marital Status: Married  Catering manager Violence: Not on file   Social History   Tobacco Use  Smoking Status Never  Smokeless Tobacco Never   Social History   Substance and Sexual Activity  Alcohol Use Yes   Comment: On occasion    Family History:  Family History  Problem Relation Age of Onset   Diabetes Mother    Anemia Mother    Hyperlipidemia Mother    Arthritis Mother    Hypertension Father    Cancer Father    Celiac disease Sister    Diabetes Maternal Grandmother    Stroke Maternal Grandmother    Heart attack Maternal Grandmother    Heart disease Paternal Grandfather  Past medical history, surgical history, medications, allergies, family history and social history reviewed with patient today and changes made to appropriate areas of the chart.   Review of Systems  Constitutional:  Positive for diaphoresis. Negative for chills, fever, malaise/fatigue and weight loss.  HENT: Negative.    Eyes:  Positive for blurred vision. Negative for double vision,  photophobia, pain, discharge and redness.  Respiratory:  Positive for shortness of breath and wheezing. Negative for cough, hemoptysis and sputum production.   Cardiovascular: Negative.   Gastrointestinal:  Positive for abdominal pain, constipation and diarrhea. Negative for blood in stool, heartburn, melena, nausea and vomiting.  Genitourinary: Negative.   Musculoskeletal:  Positive for back pain, joint pain and myalgias. Negative for falls and neck pain.  Skin: Negative.   Neurological:  Positive for tingling. Negative for dizziness, tremors, sensory change, speech change, focal weakness, seizures, loss of consciousness, weakness and headaches.  Endo/Heme/Allergies: Negative.   Psychiatric/Behavioral:  Negative for depression, hallucinations, memory loss, substance abuse and suicidal ideas. The patient is nervous/anxious. The patient does not have insomnia.     All other ROS negative except what is listed above and in the HPI.      Objective:    BP (!) 144/83   Pulse 83   Temp 98.1 F (36.7 C)   Ht 5\' 5"  (1.651 m)   Wt 220 lb 12.8 oz (100.2 kg)   LMP  (LMP Unknown)   SpO2 97%   BMI 36.74 kg/m   Wt Readings from Last 3 Encounters:  04/21/23 220 lb 12.8 oz (100.2 kg)  04/11/23 219 lb 9.6 oz (99.6 kg)  03/17/23 215 lb 6.4 oz (97.7 kg)    Vision Screening   Right eye Left eye Both eyes  Without correction     With correction 20/20 20/20 20/13     Physical Exam Vitals and nursing note reviewed. Exam conducted with a chaperone present.  Constitutional:      General: She is not in acute distress.    Appearance: Normal appearance. She is obese. She is not ill-appearing, toxic-appearing or diaphoretic.  HENT:     Head: Normocephalic and atraumatic.     Right Ear: Tympanic membrane, ear canal and external ear normal. There is no impacted cerumen.     Left Ear: Tympanic membrane, ear canal and external ear normal. There is no impacted cerumen.     Nose: Nose normal. No congestion  or rhinorrhea.     Mouth/Throat:     Mouth: Mucous membranes are moist.     Pharynx: Oropharynx is clear. No oropharyngeal exudate or posterior oropharyngeal erythema.  Eyes:     General: No scleral icterus.       Right eye: No discharge.        Left eye: No discharge.     Extraocular Movements: Extraocular movements intact.     Conjunctiva/sclera: Conjunctivae normal.     Pupils: Pupils are equal, round, and reactive to light.  Neck:     Vascular: No carotid bruit.  Cardiovascular:     Rate and Rhythm: Normal rate and regular rhythm.     Pulses: Normal pulses.     Heart sounds: No murmur heard.    No friction rub. No gallop.  Pulmonary:     Effort: Pulmonary effort is normal. No respiratory distress.     Breath sounds: Normal breath sounds. No stridor. No wheezing, rhonchi or rales.  Chest:     Chest wall: No tenderness.  Breasts:  Right: Normal.     Left: Normal.  Abdominal:     General: Abdomen is flat. Bowel sounds are normal. There is no distension.     Palpations: Abdomen is soft. There is no mass.     Tenderness: There is no abdominal tenderness. There is no right CVA tenderness, left CVA tenderness, guarding or rebound.     Hernia: No hernia is present.  Genitourinary:    Labia:        Right: No rash, tenderness, lesion or injury.        Left: No rash, tenderness, lesion or injury.      Vagina: Normal.     Cervix: Normal.     Uterus: Normal.      Adnexa: Right adnexa normal and left adnexa normal.  Musculoskeletal:        General: No swelling, tenderness, deformity or signs of injury.     Cervical back: Normal range of motion and neck supple. No rigidity. No muscular tenderness.     Right lower leg: No edema.     Left lower leg: No edema.  Lymphadenopathy:     Cervical: No cervical adenopathy.  Skin:    General: Skin is warm and dry.     Capillary Refill: Capillary refill takes less than 2 seconds.     Coloration: Skin is not jaundiced or pale.      Findings: No bruising, erythema, lesion or rash.  Neurological:     General: No focal deficit present.     Mental Status: She is alert and oriented to person, place, and time. Mental status is at baseline.     Cranial Nerves: No cranial nerve deficit.     Sensory: No sensory deficit.     Motor: No weakness.     Coordination: Coordination normal.     Gait: Gait normal.     Deep Tendon Reflexes: Reflexes normal.  Psychiatric:        Mood and Affect: Mood normal.        Behavior: Behavior normal.        Thought Content: Thought content normal.        Judgment: Judgment normal.        04/21/2023    9:56 AM  6CIT Screen  What Year? 0 points  What month? 0 points  What time? 0 points  Count back from 20 0 points  Months in reverse 0 points  Repeat phrase 0 points  Total Score 0 points    Results for orders placed or performed in visit on 04/21/23  Microalbumin, Urine Waived   Collection Time: 04/21/23 10:12 AM  Result Value Ref Range   Microalb, Ur Waived 80 (H) 0 - 19 mg/L   Creatinine, Urine Waived 300 10 - 300 mg/dL   Microalb/Creat Ratio 30-300 (H) <30 mg/g  Bayer DCA Hb A1c Waived   Collection Time: 04/21/23 10:12 AM  Result Value Ref Range   HB A1C (BAYER DCA - WAIVED) 6.1 (H) 4.8 - 5.6 %      Assessment & Plan:   Problem List Items Addressed This Visit       Cardiovascular and Mediastinum   HTN (hypertension)   Under good control on current regimen. Continue current regimen. Continue to monitor. Call with any concerns. Refills given. Labs drawn today.       Relevant Medications   propranolol ER (INDERAL LA) 60 MG 24 hr capsule   spironolactone (ALDACTONE) 100 MG tablet   Other Relevant Orders  Microalbumin, Urine Waived (Completed)   CBC with Differential/Platelet   Comprehensive metabolic panel   TSH     Endocrine   IFG (impaired fasting glucose)   Stable with A1c of 6.1. Continue current regimen. Continue to monitor. Call with any concerns.        Relevant Orders   Bayer DCA Hb A1c Waived (Completed)   CBC with Differential/Platelet   Comprehensive metabolic panel     Other   Hyperlipidemia   Under good control on current regimen. Continue current regimen. Continue to monitor. Call with any concerns. Refills given. Labs drawn today.        Relevant Medications   propranolol ER (INDERAL LA) 60 MG 24 hr capsule   spironolactone (ALDACTONE) 100 MG tablet   Other Relevant Orders   CBC with Differential/Platelet   Comprehensive metabolic panel   Lipid Panel w/o Chol/HDL Ratio   Depression, major, recurrent, moderate (HCC)   Under good control on current regimen. Continue current regimen. Continue to monitor. Call with any concerns. Refills given. Labs drawn today.       Relevant Medications   buPROPion (WELLBUTRIN XL) 300 MG 24 hr tablet   citalopram (CELEXA) 40 MG tablet   Other Relevant Orders   CBC with Differential/Platelet   Comprehensive metabolic panel   TSH   Myalgia due to statin   Unable to tolerate statins.       Relevant Orders   CBC with Differential/Platelet   Comprehensive metabolic panel   Other Visit Diagnoses       Welcome to Medicare preventive visit    -  Primary   Vaccines up to date. Screening labs checked today. Pap done. Mammo, DEXA and colonoscopy ordered. Continue diet and exercise. Call with any concerns.     Encounter for screening mammogram for malignant neoplasm of breast       Mammo ordered today.   Relevant Orders   MM 3D SCREENING MAMMOGRAM BILATERAL BREAST     Screening for colon cancer       Colonoscopy ordered today.   Relevant Orders   DG Bone Density   Ambulatory referral to Gastroenterology     Screening for cardiovascular condition       EKG looks good. NSR at 70bpm without ST segment changes.   Relevant Orders   EKG 12-Lead     Screening for cervical cancer       Pap done today.   Relevant Orders   Cytology - PAP     Postmenopausal estrogen deficiency       DEXA  ordered today.        Preventative Services:  AAA screening: N/A Health Risk Assessment and Personalized Prevention Plan: Done today Bone Mass Measurements: Ordered today Breast Cancer Screening: Ordered today CVD Screening: Done today Cervical Cancer Screening: Done today Colon Cancer Screening: Ordered today Depression Screening: Done today Diabetes Screening: Done today Glaucoma Screening: See your eye doctor Hepatitis B vaccine: N/A Hepatitis C screening: up to date HIV Screening: up to date Flu Vaccine: up to date Lung cancer Screening: N/A Obesity Screening: Done today Pneumonia Vaccines: up to date STI Screening: N/A  Follow up plan: Return in about 4 weeks (around 05/19/2023) for follow up back.   LABORATORY TESTING:  - Pap smear: pap done  IMMUNIZATIONS:   - Tdap: Tetanus vaccination status reviewed: last tetanus booster within 10 years. - Influenza: Up to date - Prevnar: Up to date - Zostavax vaccine: Given elsewhere  SCREENING: -Mammogram: Ordered today  -  Colonoscopy: Up to date  - Bone Density: Ordered today    PATIENT COUNSELING:   Advised to take 1 mg of folate supplement per day if capable of pregnancy.   Sexuality: Discussed sexually transmitted diseases, partner selection, use of condoms, avoidance of unintended pregnancy  and contraceptive alternatives.   Advised to avoid cigarette smoking.  I discussed with the patient that most people either abstain from alcohol or drink within safe limits (<=14/week and <=4 drinks/occasion for males, <=7/weeks and <= 3 drinks/occasion for females) and that the risk for alcohol disorders and other health effects rises proportionally with the number of drinks per week and how often a drinker exceeds daily limits.  Discussed cessation/primary prevention of drug use and availability of treatment for abuse.   Diet: Encouraged to adjust caloric intake to maintain  or achieve ideal body weight, to reduce intake of  dietary saturated fat and total fat, to limit sodium intake by avoiding high sodium foods and not adding table salt, and to maintain adequate dietary potassium and calcium preferably from fresh fruits, vegetables, and low-fat dairy products.    stressed the importance of regular exercise  Injury prevention: Discussed safety belts, safety helmets, smoke detector, smoking near bedding or upholstery.   Dental health: Discussed importance of regular tooth brushing, flossing, and dental visits.    NEXT PREVENTATIVE PHYSICAL DUE IN 1 YEAR. Return in about 4 weeks (around 05/19/2023) for follow up back.

## 2023-04-21 NOTE — Assessment & Plan Note (Signed)
Unable to tolerate statins 

## 2023-04-21 NOTE — Patient Instructions (Signed)
Preventative Services:  AAA screening: N/A Health Risk Assessment and Personalized Prevention Plan: Done today Bone Mass Measurements: Ordered today Breast Cancer Screening: Ordered today CVD Screening: Done today Cervical Cancer Screening: Done today Colon Cancer Screening: Ordered today Depression Screening: Done today Diabetes Screening: Done today Glaucoma Screening: See your eye doctor Hepatitis B vaccine: N/A Hepatitis C screening: up to date HIV Screening: up to date Flu Vaccine: up to date Lung cancer Screening: N/A Obesity Screening: Done today Pneumonia Vaccines: up to date STI Screening: N/A

## 2023-04-21 NOTE — Assessment & Plan Note (Signed)
Stable with A1c of 6.1. Continue current regimen. Continue to monitor. Call with any concerns.

## 2023-04-22 LAB — COMPREHENSIVE METABOLIC PANEL
ALT: 45 [IU]/L — ABNORMAL HIGH (ref 0–32)
AST: 32 [IU]/L (ref 0–40)
Albumin: 4.3 g/dL (ref 3.9–4.9)
Alkaline Phosphatase: 82 [IU]/L (ref 44–121)
BUN/Creatinine Ratio: 14 (ref 12–28)
BUN: 15 mg/dL (ref 8–27)
Bilirubin Total: 0.8 mg/dL (ref 0.0–1.2)
CO2: 22 mmol/L (ref 20–29)
Calcium: 9.2 mg/dL (ref 8.7–10.3)
Chloride: 101 mmol/L (ref 96–106)
Creatinine, Ser: 1.07 mg/dL — ABNORMAL HIGH (ref 0.57–1.00)
Globulin, Total: 2.7 g/dL (ref 1.5–4.5)
Glucose: 90 mg/dL (ref 70–99)
Potassium: 4.4 mmol/L (ref 3.5–5.2)
Sodium: 139 mmol/L (ref 134–144)
Total Protein: 7 g/dL (ref 6.0–8.5)
eGFR: 58 mL/min/{1.73_m2} — ABNORMAL LOW (ref >59)

## 2023-04-22 LAB — CBC WITH DIFFERENTIAL/PLATELET
Basophils Absolute: 0.1 10*3/uL (ref 0.0–0.2)
Basos: 1 %
EOS (ABSOLUTE): 0.2 10*3/uL (ref 0.0–0.4)
Eos: 2 %
Hematocrit: 40.4 % (ref 34.0–46.6)
Hemoglobin: 13.1 g/dL (ref 11.1–15.9)
Immature Grans (Abs): 0 10*3/uL (ref 0.0–0.1)
Immature Granulocytes: 0 %
Lymphocytes Absolute: 3.9 10*3/uL — ABNORMAL HIGH (ref 0.7–3.1)
Lymphs: 46 %
MCH: 30.4 pg (ref 26.6–33.0)
MCHC: 32.4 g/dL (ref 31.5–35.7)
MCV: 94 fL (ref 79–97)
Monocytes Absolute: 0.6 10*3/uL (ref 0.1–0.9)
Monocytes: 8 %
Neutrophils Absolute: 3.6 10*3/uL (ref 1.4–7.0)
Neutrophils: 43 %
Platelets: 263 10*3/uL (ref 150–450)
RBC: 4.31 x10E6/uL (ref 3.77–5.28)
RDW: 12.3 % (ref 11.7–15.4)
WBC: 8.4 10*3/uL (ref 3.4–10.8)

## 2023-04-22 LAB — LIPID PANEL W/O CHOL/HDL RATIO
Cholesterol, Total: 247 mg/dL — ABNORMAL HIGH (ref 100–199)
HDL: 50 mg/dL (ref >39)
LDL Chol Calc (NIH): 180 mg/dL — ABNORMAL HIGH (ref 0–99)
Triglycerides: 99 mg/dL (ref 0–149)
VLDL Cholesterol Cal: 17 mg/dL (ref 5–40)

## 2023-04-22 LAB — TSH: TSH: 1.5 u[IU]/mL (ref 0.450–4.500)

## 2023-04-23 ENCOUNTER — Encounter: Payer: Self-pay | Admitting: Family Medicine

## 2023-04-24 ENCOUNTER — Ambulatory Visit: Payer: Medicare PPO | Admitting: Podiatry

## 2023-04-27 LAB — CYTOLOGY - PAP
Comment: NEGATIVE
Diagnosis: NEGATIVE
High risk HPV: NEGATIVE

## 2023-04-28 DIAGNOSIS — M4716 Other spondylosis with myelopathy, lumbar region: Secondary | ICD-10-CM | POA: Diagnosis not present

## 2023-05-08 ENCOUNTER — Encounter: Payer: Self-pay | Admitting: *Deleted

## 2023-05-10 ENCOUNTER — Encounter: Payer: Self-pay | Admitting: Family Medicine

## 2023-05-10 ENCOUNTER — Other Ambulatory Visit: Payer: Self-pay | Admitting: Family Medicine

## 2023-05-10 DIAGNOSIS — S93401D Sprain of unspecified ligament of right ankle, subsequent encounter: Secondary | ICD-10-CM | POA: Diagnosis not present

## 2023-05-10 DIAGNOSIS — M5416 Radiculopathy, lumbar region: Secondary | ICD-10-CM

## 2023-05-10 DIAGNOSIS — M25571 Pain in right ankle and joints of right foot: Secondary | ICD-10-CM | POA: Diagnosis not present

## 2023-05-10 DIAGNOSIS — R9389 Abnormal findings on diagnostic imaging of other specified body structures: Secondary | ICD-10-CM | POA: Insufficient documentation

## 2023-05-10 DIAGNOSIS — M4726 Other spondylosis with radiculopathy, lumbar region: Secondary | ICD-10-CM

## 2023-05-12 ENCOUNTER — Ambulatory Visit: Payer: Medicare PPO | Admitting: Family Medicine

## 2023-05-12 ENCOUNTER — Encounter: Payer: Self-pay | Admitting: Family Medicine

## 2023-05-12 VITALS — BP 145/78 | HR 73 | Wt 225.0 lb

## 2023-05-12 DIAGNOSIS — M9908 Segmental and somatic dysfunction of rib cage: Secondary | ICD-10-CM

## 2023-05-12 DIAGNOSIS — E782 Mixed hyperlipidemia: Secondary | ICD-10-CM

## 2023-05-12 DIAGNOSIS — R590 Localized enlarged lymph nodes: Secondary | ICD-10-CM

## 2023-05-12 DIAGNOSIS — M9903 Segmental and somatic dysfunction of lumbar region: Secondary | ICD-10-CM

## 2023-05-12 DIAGNOSIS — M9905 Segmental and somatic dysfunction of pelvic region: Secondary | ICD-10-CM

## 2023-05-12 DIAGNOSIS — M9901 Segmental and somatic dysfunction of cervical region: Secondary | ICD-10-CM

## 2023-05-12 DIAGNOSIS — M5416 Radiculopathy, lumbar region: Secondary | ICD-10-CM

## 2023-05-12 DIAGNOSIS — M9902 Segmental and somatic dysfunction of thoracic region: Secondary | ICD-10-CM

## 2023-05-12 DIAGNOSIS — M9909 Segmental and somatic dysfunction of abdomen and other regions: Secondary | ICD-10-CM | POA: Diagnosis not present

## 2023-05-12 DIAGNOSIS — M9904 Segmental and somatic dysfunction of sacral region: Secondary | ICD-10-CM

## 2023-05-12 DIAGNOSIS — M9900 Segmental and somatic dysfunction of head region: Secondary | ICD-10-CM | POA: Diagnosis not present

## 2023-05-12 DIAGNOSIS — M9907 Segmental and somatic dysfunction of upper extremity: Secondary | ICD-10-CM | POA: Diagnosis not present

## 2023-05-12 MED ORDER — ROSUVASTATIN CALCIUM 20 MG PO TABS: 20.0000 mg | ORAL_TABLET | ORAL | 0 refills | Status: AC

## 2023-05-12 NOTE — Assessment & Plan Note (Signed)
 Will start her on weekly crestor . Call with any concerns. Continue to monitor.

## 2023-05-12 NOTE — Progress Notes (Signed)
 BP (!) 145/78 (BP Location: Left Arm, Cuff Size: Normal)   Pulse 73   Wt 225 lb (102.1 kg)   LMP  (LMP Unknown)   SpO2 96%   BMI 37.44 kg/m    Subjective:    Patient ID: Tracie Sheppard, female    DOB: 1955/07/06, 68 y.o.   MRN: [id number]  HPI: Tracie Sheppard is a 68 y.o. female  Chief Complaint  Patient presents with   Back Pain   Tracie Sheppard presents today for evaluation and possible treatment with OMT for low back pain. She had her MRI done recently. She notes that her back continues to hurt. It is aching and sore. Better with OMT. Worse with driving and different positions. Pain radiates down her R leg. She has otherwise been doing OK, but notes that she has been very anxious recently.  HYPERLIPIDEMIA Hyperlipidemia status: uncontrolled Satisfied with current treatment?  no Side effects:  yes Medication compliance: N/A Past cholesterol meds: none recently Supplements: none Aspirin:  no The 10-year ASCVD risk score (Arnett DK, et al., 2019) is: 15%   Values used to calculate the score:     Age: 14 years     Sex: Female     Is Non-Hispanic African American: Yes     Diabetic: No     Tobacco smoker: No     Systolic Blood Pressure: 145 mmHg     Is BP treated: Yes     HDL Cholesterol: 50 mg/dL     Total Cholesterol: 247 mg/dL Chest pain:  no  LUMP Duration: months Location: behind her L ear Onset: gradual Painful: no Discomfort: yes Status:  not changing Trauma: no Redness: no Bruising: no Recent infection: no Swollen lymph nodes: no Requesting removal: unknown History of cancer: no Family history of cancer: no History of the same: no  Relevant past medical, surgical, family and social history reviewed and updated as indicated. Interim medical history since our last visit reviewed. Allergies and medications reviewed and updated.  Review of Systems  Constitutional: Negative.   Respiratory: Negative.    Cardiovascular: Negative.   Musculoskeletal:  Positive  for back pain and myalgias. Negative for arthralgias, gait problem, joint swelling, neck pain and neck stiffness.  Skin: Negative.   Neurological: Negative.   Psychiatric/Behavioral: Negative.      Per HPI unless specifically indicated above     Objective:    BP (!) 145/78 (BP Location: Left Arm, Cuff Size: Normal)   Pulse 73   Wt 225 lb (102.1 kg)   LMP  (LMP Unknown)   SpO2 96%   BMI 37.44 kg/m   Wt Readings from Last 3 Encounters:  05/12/23 225 lb (102.1 kg)  04/21/23 220 lb 12.8 oz (100.2 kg)  04/11/23 219 lb 9.6 oz (99.6 kg)    Physical Exam Vitals and nursing note reviewed.  Constitutional:      General: She is not in acute distress.    Appearance: Normal appearance. She is obese. She is not ill-appearing.  HENT:     Head: Normocephalic and atraumatic.     Right Ear: External ear normal.     Left Ear: External ear normal.     Nose: Nose normal.     Mouth/Throat:     Mouth: Mucous membranes are moist.     Pharynx: Oropharynx is clear.  Eyes:     Extraocular Movements: Extraocular movements intact.     Conjunctiva/sclera: Conjunctivae normal.     Pupils: Pupils are equal, round,  and reactive to light.  Neck:     Vascular: No carotid bruit.     Comments: Soft, mobile mass behind L ear Cardiovascular:     Rate and Rhythm: Normal rate.     Pulses: Normal pulses.  Pulmonary:     Effort: Pulmonary effort is normal. No respiratory distress.  Abdominal:     General: Abdomen is flat. There is no distension.     Palpations: Abdomen is soft. There is no mass.     Tenderness: There is no abdominal tenderness. There is no right CVA tenderness, left CVA tenderness, guarding or rebound.     Hernia: No hernia is present.  Musculoskeletal:     Cervical back: No muscular tenderness.  Lymphadenopathy:     Cervical: No cervical adenopathy.  Skin:    General: Skin is warm and dry.     Capillary Refill: Capillary refill takes less than 2 seconds.     Coloration: Skin is not  jaundiced or pale.     Findings: No bruising, erythema, lesion or rash.  Neurological:     General: No focal deficit present.     Mental Status: She is alert. Mental status is at baseline.  Psychiatric:        Mood and Affect: Mood normal.        Behavior: Behavior normal.        Thought Content: Thought content normal.        Judgment: Judgment normal.   Musculoskeletal:  Exam found Decreased ROM, Tissue texture changes, Tenderness to palpation, and Asymmetry of patient's  head, neck, thorax, ribs, lumbar, pelvis, sacrum, upper extremity, and abdomen Osteopathic Structural Exam:   Head: hypertonic suboccipital muscles, OA ESSR  Neck: hypertonic scalenes bilaterally R>L  Thorax: T4-7 SRRL, peiscapular hypertonicity, trap hypertonic on the R  Ribs: Ribs 6-8 locked up on the R, Rib 7 locked up on the L  Lumbar: QL hypertonic on the R, L3-5SLRR  Pelvis: Posterior R innominate  Sacrum: R on R torsion  Upper Extremity: pec hypertonic on the R, fascial strain into R arm from R pec  Abdomen: diaphragm hypertonic on the R   Results for orders placed or performed in visit on 04/21/23  Cytology - PAP   Collection Time: 04/21/23  9:19 AM  Result Value Ref Range   High risk HPV Negative    Adequacy      Satisfactory for evaluation; transformation zone component PRESENT.   Diagnosis      - Negative for intraepithelial lesion or malignancy (NILM)   Comment Normal Reference Range HPV - Negative   Microalbumin, Urine Waived   Collection Time: 04/21/23 10:12 AM  Result Value Ref Range   Microalb, Ur Waived 80 (H) 0 - 19 mg/L   Creatinine, Urine Waived 300 10 - 300 mg/dL   Microalb/Creat Ratio 30-300 (H) <30 mg/g  Bayer DCA Hb A1c Waived   Collection Time: 04/21/23 10:12 AM  Result Value Ref Range   HB A1C (BAYER DCA - WAIVED) 6.1 (H) 4.8 - 5.6 %  CBC with Differential/Platelet   Collection Time: 04/21/23 10:13 AM  Result Value Ref Range   WBC 8.4 3.4 - 10.8 x10E3/uL   RBC 4.31 3.77 -  5.28 x10E6/uL   Hemoglobin 13.1 11.1 - 15.9 g/dL   Hematocrit 59.5 65.9 - 46.6 %   MCV 94 79 - 97 fL   MCH 30.4 26.6 - 33.0 pg   MCHC 32.4 31.5 - 35.7 g/dL   RDW 87.6 88.2 -  15.4 %   Platelets 263 150 - 450 x10E3/uL   Neutrophils 43 Not Estab. %   Lymphs 46 Not Estab. %   Monocytes 8 Not Estab. %   Eos 2 Not Estab. %   Basos 1 Not Estab. %   Neutrophils Absolute 3.6 1.4 - 7.0 x10E3/uL   Lymphocytes Absolute 3.9 (H) 0.7 - 3.1 x10E3/uL   Monocytes Absolute 0.6 0.1 - 0.9 x10E3/uL   EOS (ABSOLUTE) 0.2 0.0 - 0.4 x10E3/uL   Basophils Absolute 0.1 0.0 - 0.2 x10E3/uL   Immature Granulocytes 0 Not Estab. %   Immature Grans (Abs) 0.0 0.0 - 0.1 x10E3/uL  Comprehensive metabolic panel   Collection Time: 04/21/23 10:13 AM  Result Value Ref Range   Glucose 90 70 - 99 mg/dL   BUN 15 8 - 27 mg/dL   Creatinine, Ser 8.92 (H) 0.57 - 1.00 mg/dL   eGFR 58 (L) >40 fO/fpw/8.26   BUN/Creatinine Ratio 14 12 - 28   Sodium 139 134 - 144 mmol/L   Potassium 4.4 3.5 - 5.2 mmol/L   Chloride 101 96 - 106 mmol/L   CO2 22 20 - 29 mmol/L   Calcium  9.2 8.7 - 10.3 mg/dL   Total Protein 7.0 6.0 - 8.5 g/dL   Albumin 4.3 3.9 - 4.9 g/dL   Globulin, Total 2.7 1.5 - 4.5 g/dL   Bilirubin Total 0.8 0.0 - 1.2 mg/dL   Alkaline Phosphatase 82 44 - 121 IU/L   AST 32 0 - 40 IU/L   ALT 45 (H) 0 - 32 IU/L  Lipid Panel w/o Chol/HDL Ratio   Collection Time: 04/21/23 10:13 AM  Result Value Ref Range   Cholesterol, Total 247 (H) 100 - 199 mg/dL   Triglycerides 99 0 - 149 mg/dL   HDL 50 >60 mg/dL   VLDL Cholesterol Cal 17 5 - 40 mg/dL   LDL Chol Calc (NIH) 819 (H) 0 - 99 mg/dL  TSH   Collection Time: 04/21/23 10:13 AM  Result Value Ref Range   TSH 1.500 0.450 - 4.500 uIU/mL      Assessment & Plan:   Problem List Items Addressed This Visit       Nervous and Auditory   Lumbar radiculopathy - Primary   Discussed MRI in detail today. She will go to PM&R for evaluation. She does have somatic dysfunction that is  contributing to her symptoms. Treated today with good results as below.         Other   Hyperlipidemia   Will start her on weekly crestor . Call with any concerns. Continue to monitor.      Relevant Medications   rosuvastatin  (CRESTOR ) 20 MG tablet   Other Visit Diagnoses       Lymphadenopathy of left cervical region       Unclear if cyst or lymph node. Will check US . Await results. Treat as needed.   Relevant Orders   US  Soft Tissue Head/Neck (NON-THYROID )     Head region somatic dysfunction         Cervical segment dysfunction         Thoracic segment dysfunction         Somatic dysfunction of sacral region         Somatic dysfunction of pelvis region         Rib cage region somatic dysfunction         Segmental dysfunction of abdomen         Somatic dysfunction of lumbar region  Somatic dysfunction of upper extremities          After verbal consent was obtained, patient was treated today with osteopathic manipulative medicine to the regions of the head, neck, thorax, ribs, lumbar, pelvis, sacrum, abdomen, and upper extremity using the techniques of cranial, myofascial release, counterstrain, muscle energy, HVLA, and soft tissue. Areas of compensation relating to her primary pain source also treated. Patient tolerated the procedure well with good objective and good subjective improvement in symptoms. She left the room in good condition. She was advised to stay well hydrated and that she may have some soreness following the procedure. If not improving or worsening, she will call and come in. She will return for reevaluation  on a PRN basis.   Follow up plan: Return 6-8 weeks, 40 min, OK to cancel appt 2/20.

## 2023-05-12 NOTE — Assessment & Plan Note (Signed)
 Discussed MRI in detail today. She will go to PM&R for evaluation. She does have somatic dysfunction that is contributing to her symptoms. Treated today with good results as below.

## 2023-05-15 DIAGNOSIS — M25571 Pain in right ankle and joints of right foot: Secondary | ICD-10-CM | POA: Diagnosis not present

## 2023-05-15 DIAGNOSIS — S93401D Sprain of unspecified ligament of right ankle, subsequent encounter: Secondary | ICD-10-CM | POA: Diagnosis not present

## 2023-05-17 DIAGNOSIS — M25571 Pain in right ankle and joints of right foot: Secondary | ICD-10-CM | POA: Diagnosis not present

## 2023-05-17 DIAGNOSIS — S93401D Sprain of unspecified ligament of right ankle, subsequent encounter: Secondary | ICD-10-CM | POA: Diagnosis not present

## 2023-05-22 DIAGNOSIS — S93401D Sprain of unspecified ligament of right ankle, subsequent encounter: Secondary | ICD-10-CM | POA: Diagnosis not present

## 2023-05-22 DIAGNOSIS — M25571 Pain in right ankle and joints of right foot: Secondary | ICD-10-CM | POA: Diagnosis not present

## 2023-05-23 ENCOUNTER — Ambulatory Visit
Admission: RE | Admit: 2023-05-23 | Discharge: 2023-05-23 | Disposition: A | Discharge status: Home / Self Care | Payer: Medicare PPO | Source: Ambulatory Visit | Attending: Family Medicine | Admitting: Family Medicine

## 2023-05-23 DIAGNOSIS — R590 Localized enlarged lymph nodes: Secondary | ICD-10-CM | POA: Insufficient documentation

## 2023-05-23 DIAGNOSIS — R221 Localized swelling, mass and lump, neck: Secondary | ICD-10-CM | POA: Diagnosis not present

## 2023-05-24 DIAGNOSIS — M25571 Pain in right ankle and joints of right foot: Secondary | ICD-10-CM | POA: Diagnosis not present

## 2023-05-24 DIAGNOSIS — S93401D Sprain of unspecified ligament of right ankle, subsequent encounter: Secondary | ICD-10-CM | POA: Diagnosis not present

## 2023-05-25 ENCOUNTER — Ambulatory Visit: Payer: Self-pay | Admitting: Family Medicine

## 2023-05-29 DIAGNOSIS — S93401D Sprain of unspecified ligament of right ankle, subsequent encounter: Secondary | ICD-10-CM | POA: Diagnosis not present

## 2023-05-29 DIAGNOSIS — M25571 Pain in right ankle and joints of right foot: Secondary | ICD-10-CM | POA: Diagnosis not present

## 2023-05-31 DIAGNOSIS — S93401D Sprain of unspecified ligament of right ankle, subsequent encounter: Secondary | ICD-10-CM | POA: Diagnosis not present

## 2023-05-31 DIAGNOSIS — M25571 Pain in right ankle and joints of right foot: Secondary | ICD-10-CM | POA: Diagnosis not present

## 2023-06-05 ENCOUNTER — Encounter: Payer: Self-pay | Admitting: Family Medicine

## 2023-07-13 ENCOUNTER — Ambulatory Visit: Payer: Medicare PPO | Admitting: Family Medicine

## 2023-07-21 ENCOUNTER — Ambulatory Visit: Admitting: Family Medicine

## 2023-07-21 ENCOUNTER — Ambulatory Visit
Admission: RE | Admit: 2023-07-21 | Discharge: 2023-07-21 | Disposition: A | Discharge status: Home / Self Care | Attending: Family Medicine | Admitting: Family Medicine

## 2023-07-21 ENCOUNTER — Ambulatory Visit
Admission: RE | Admit: 2023-07-21 | Discharge: 2023-07-21 | Disposition: A | Discharge status: Home / Self Care | Source: Ambulatory Visit | Attending: Family Medicine

## 2023-07-21 ENCOUNTER — Encounter: Payer: Self-pay | Admitting: Family Medicine

## 2023-07-21 VITALS — BP 103/68 | HR 73 | Ht 65.0 in | Wt 215.6 lb

## 2023-07-21 DIAGNOSIS — M5416 Radiculopathy, lumbar region: Secondary | ICD-10-CM | POA: Diagnosis not present

## 2023-07-21 DIAGNOSIS — M9903 Segmental and somatic dysfunction of lumbar region: Secondary | ICD-10-CM

## 2023-07-21 DIAGNOSIS — M9904 Segmental and somatic dysfunction of sacral region: Secondary | ICD-10-CM

## 2023-07-21 DIAGNOSIS — M9900 Segmental and somatic dysfunction of head region: Secondary | ICD-10-CM | POA: Diagnosis not present

## 2023-07-21 DIAGNOSIS — M9909 Segmental and somatic dysfunction of abdomen and other regions: Secondary | ICD-10-CM

## 2023-07-21 DIAGNOSIS — M9902 Segmental and somatic dysfunction of thoracic region: Secondary | ICD-10-CM

## 2023-07-21 DIAGNOSIS — M9905 Segmental and somatic dysfunction of pelvic region: Secondary | ICD-10-CM

## 2023-07-21 DIAGNOSIS — M9906 Segmental and somatic dysfunction of lower extremity: Secondary | ICD-10-CM | POA: Diagnosis not present

## 2023-07-21 DIAGNOSIS — M9901 Segmental and somatic dysfunction of cervical region: Secondary | ICD-10-CM

## 2023-07-21 DIAGNOSIS — M25562 Pain in left knee: Secondary | ICD-10-CM | POA: Insufficient documentation

## 2023-07-21 DIAGNOSIS — M9908 Segmental and somatic dysfunction of rib cage: Secondary | ICD-10-CM

## 2023-07-21 NOTE — Progress Notes (Signed)
 BP 103/68 (BP Location: Left Arm, Patient Position: Sitting, Cuff Size: Large)   Pulse 73   Ht 5' 5 (1.651 m)   Wt 215 lb 9.6 oz (97.8 kg)   LMP  (LMP Unknown)   BMI 35.88 kg/m    Subjective:    Patient ID: Tracie Sheppard Pounds, female    DOB: January 13, 1956, 68 y.o.   MRN: [id number]  HPI: Tracie Sheppard is a 68 y.o. female  Chief Complaint  Patient presents with   Back Pain   Fall    Pt had fall on Wednesday. Fell down the steps.    Sonali presents today for evaluation and possible treatment with OMT for low back pain. She notes that she fell down some steps on Wednesday and has been feeling tight and achey since then. She notes that her pain is in her buttocks and radiates down her legs L>R. Better with rest and ice and heat and worse with moving around a lot. She is otherwise doing well with no other concerns or complaints at this time.   Relevant past medical, surgical, family and social history reviewed and updated as indicated. Interim medical history since our last visit reviewed. Allergies and medications reviewed and updated.  Review of Systems  Constitutional: Negative.   Respiratory: Negative.    Cardiovascular: Negative.   Gastrointestinal: Negative.   Musculoskeletal:  Positive for back pain and myalgias. Negative for arthralgias, gait problem, joint swelling, neck pain and neck stiffness.  Skin: Negative.   Neurological: Negative.   Psychiatric/Behavioral: Negative.      Per HPI unless specifically indicated above     Objective:    BP 103/68 (BP Location: Left Arm, Patient Position: Sitting, Cuff Size: Large)   Pulse 73   Ht 5' 5 (1.651 m)   Wt 215 lb 9.6 oz (97.8 kg)   LMP  (LMP Unknown)   BMI 35.88 kg/m   Wt Readings from Last 3 Encounters:  07/21/23 215 lb 9.6 oz (97.8 kg)  05/12/23 225 lb (102.1 kg)  04/21/23 220 lb 12.8 oz (100.2 kg)    Physical Exam Vitals and nursing note reviewed.  Constitutional:      General: She is not in acute distress.     Appearance: Normal appearance. She is not ill-appearing.  HENT:     Head: Normocephalic and atraumatic.     Right Ear: External ear normal.     Left Ear: External ear normal.     Nose: Nose normal.     Mouth/Throat:     Mouth: Mucous membranes are moist.     Pharynx: Oropharynx is clear.  Eyes:     Extraocular Movements: Extraocular movements intact.     Conjunctiva/sclera: Conjunctivae normal.     Pupils: Pupils are equal, round, and reactive to light.  Neck:     Vascular: No carotid bruit.  Cardiovascular:     Rate and Rhythm: Normal rate.     Pulses: Normal pulses.  Pulmonary:     Effort: Pulmonary effort is normal. No respiratory distress.  Abdominal:     General: Abdomen is flat. There is no distension.     Palpations: Abdomen is soft. There is no mass.     Tenderness: There is no abdominal tenderness. There is no right CVA tenderness, left CVA tenderness, guarding or rebound.     Hernia: No hernia is present.  Musculoskeletal:     Cervical back: No muscular tenderness.  Lymphadenopathy:     Cervical: No cervical adenopathy.  Skin:    General: Skin is warm and dry.     Capillary Refill: Capillary refill takes less than 2 seconds.     Coloration: Skin is not jaundiced or pale.     Findings: No bruising, erythema, lesion or rash.  Neurological:     General: No focal deficit present.     Mental Status: She is alert. Mental status is at baseline.  Psychiatric:        Mood and Affect: Mood normal.        Behavior: Behavior normal.        Thought Content: Thought content normal.        Judgment: Judgment normal.   Musculoskeletal:  Exam found Decreased ROM, Tissue texture changes, Tenderness to palpation, and Asymmetry of patient's  head, neck, thorax, ribs, lumbar, pelvis, sacrum, lower extremity, and abdomen Osteopathic Structural Exam:   Head: OAESSR, hypertonic suboccipital muscles bilaterally   Neck: SCM hypertonic on the R  Thorax: T3-5SLRR  Ribs: Ribs 4-6 locked  up on the L, Rib 6 locked up on the R  Lumbar: QL hypertonic bilaterally L3-5SLRR  Pelvis: posterior L innominate  Sacrum: L on L torsion  Lower Extremity: posterior fibular heads bilaterally, fascial strain from R knee into her thigh, IT bands hypertonic bilaterally L>R  Abdomen: diaphragm hypertonic bilaterally L>R   Results for orders placed or performed in visit on 04/21/23  Cytology - PAP   Collection Time: 04/21/23  9:19 AM  Result Value Ref Range   High risk HPV Negative    Adequacy      Satisfactory for evaluation; transformation zone component PRESENT.   Diagnosis      - Negative for intraepithelial lesion or malignancy (NILM)   Comment Normal Reference Range HPV - Negative   Microalbumin, Urine Waived   Collection Time: 04/21/23 10:12 AM  Result Value Ref Range   Microalb, Ur Waived 80 (H) 0 - 19 mg/L   Creatinine, Urine Waived 300 10 - 300 mg/dL   Microalb/Creat Ratio 30-300 (H) <30 mg/g  Bayer DCA Hb A1c Waived   Collection Time: 04/21/23 10:12 AM  Result Value Ref Range   HB A1C (BAYER DCA - WAIVED) 6.1 (H) 4.8 - 5.6 %  CBC with Differential/Platelet   Collection Time: 04/21/23 10:13 AM  Result Value Ref Range   WBC 8.4 3.4 - 10.8 x10E3/uL   RBC 4.31 3.77 - 5.28 x10E6/uL   Hemoglobin 13.1 11.1 - 15.9 g/dL   Hematocrit 59.5 65.9 - 46.6 %   MCV 94 79 - 97 fL   MCH 30.4 26.6 - 33.0 pg   MCHC 32.4 31.5 - 35.7 g/dL   RDW 87.6 88.2 - 84.5 %   Platelets 263 150 - 450 x10E3/uL   Neutrophils 43 Not Estab. %   Lymphs 46 Not Estab. %   Monocytes 8 Not Estab. %   Eos 2 Not Estab. %   Basos 1 Not Estab. %   Neutrophils Absolute 3.6 1.4 - 7.0 x10E3/uL   Lymphocytes Absolute 3.9 (H) 0.7 - 3.1 x10E3/uL   Monocytes Absolute 0.6 0.1 - 0.9 x10E3/uL   EOS (ABSOLUTE) 0.2 0.0 - 0.4 x10E3/uL   Basophils Absolute 0.1 0.0 - 0.2 x10E3/uL   Immature Granulocytes 0 Not Estab. %   Immature Grans (Abs) 0.0 0.0 - 0.1 x10E3/uL  Comprehensive metabolic panel   Collection Time:  04/21/23 10:13 AM  Result Value Ref Range   Glucose 90 70 - 99 mg/dL   BUN 15 8 -  27 mg/dL   Creatinine, Ser 8.92 (H) 0.57 - 1.00 mg/dL   eGFR 58 (L) >40 fO/fpw/8.26   BUN/Creatinine Ratio 14 12 - 28   Sodium 139 134 - 144 mmol/L   Potassium 4.4 3.5 - 5.2 mmol/L   Chloride 101 96 - 106 mmol/L   CO2 22 20 - 29 mmol/L   Calcium  9.2 8.7 - 10.3 mg/dL   Total Protein 7.0 6.0 - 8.5 g/dL   Albumin 4.3 3.9 - 4.9 g/dL   Globulin, Total 2.7 1.5 - 4.5 g/dL   Bilirubin Total 0.8 0.0 - 1.2 mg/dL   Alkaline Phosphatase 82 44 - 121 IU/L   AST 32 0 - 40 IU/L   ALT 45 (H) 0 - 32 IU/L  Lipid Panel w/o Chol/HDL Ratio   Collection Time: 04/21/23 10:13 AM  Result Value Ref Range   Cholesterol, Total 247 (H) 100 - 199 mg/dL   Triglycerides 99 0 - 149 mg/dL   HDL 50 >60 mg/dL   VLDL Cholesterol Cal 17 5 - 40 mg/dL   LDL Chol Calc (NIH) 819 (H) 0 - 99 mg/dL  TSH   Collection Time: 04/21/23 10:13 AM  Result Value Ref Range   TSH 1.500 0.450 - 4.500 uIU/mL      Assessment & Plan:   Problem List Items Addressed This Visit       Nervous and Auditory   Lumbar radiculopathy   In exacerbation due to fall. She does have somatic dysfunction that's contributing to her symptoms. Treated today with good results as below. Call with any concerns       Other Visit Diagnoses       Acute pain of left knee    -  Primary   Will send for x-ray given fall. She does have somatic dysfunction that's contributing to her symptoms. Treated today with good results as below. Call w concerns   Relevant Orders   DG Knee Complete 4 Views Left (Completed)     Head region somatic dysfunction         Cervical segment dysfunction         Thoracic segment dysfunction         Somatic dysfunction of sacral region         Somatic dysfunction of pelvis region         Rib cage region somatic dysfunction         Segmental dysfunction of abdomen         Somatic dysfunction of lumbar region         Somatic dysfunction of lower  extremities          After verbal consent was obtained, patient was treated today with osteopathic manipulative medicine to the regions of the head, neck, thorax, ribs, lumbar, pelvis, sacrum, abdomen, and lower extremity using the techniques of cranial, myofascial release, counterstrain, muscle energy, HVLA, and soft tissue. Areas of compensation relating to her primary pain source also treated. Patient tolerated the procedure well with good objective and good subjective improvement in symptoms. She left the room in good condition. She was advised to stay well hydrated and that she may have some soreness following the procedure. If not improving or worsening, she will call and come in. She and will return for reevaluation  on a PRN basis.   Follow up plan: Return if symptoms worsen or fail to improve.

## 2023-07-24 ENCOUNTER — Encounter: Payer: Self-pay | Admitting: Family Medicine

## 2023-07-24 NOTE — Assessment & Plan Note (Signed)
 In exacerbation due to fall. She does have somatic dysfunction that's contributing to her symptoms. Treated today with good results as below. Call with any concerns

## 2023-08-11 IMAGING — DX DG CHEST 2V
3 series · 3 of 3 positions shown · non-contrast
Comparison: None.

CLINICAL DATA: 65-year-old female with cough and shortness of
breath

EXAM:
CHEST - 2 VIEW

[chest pa (1 of 2)]
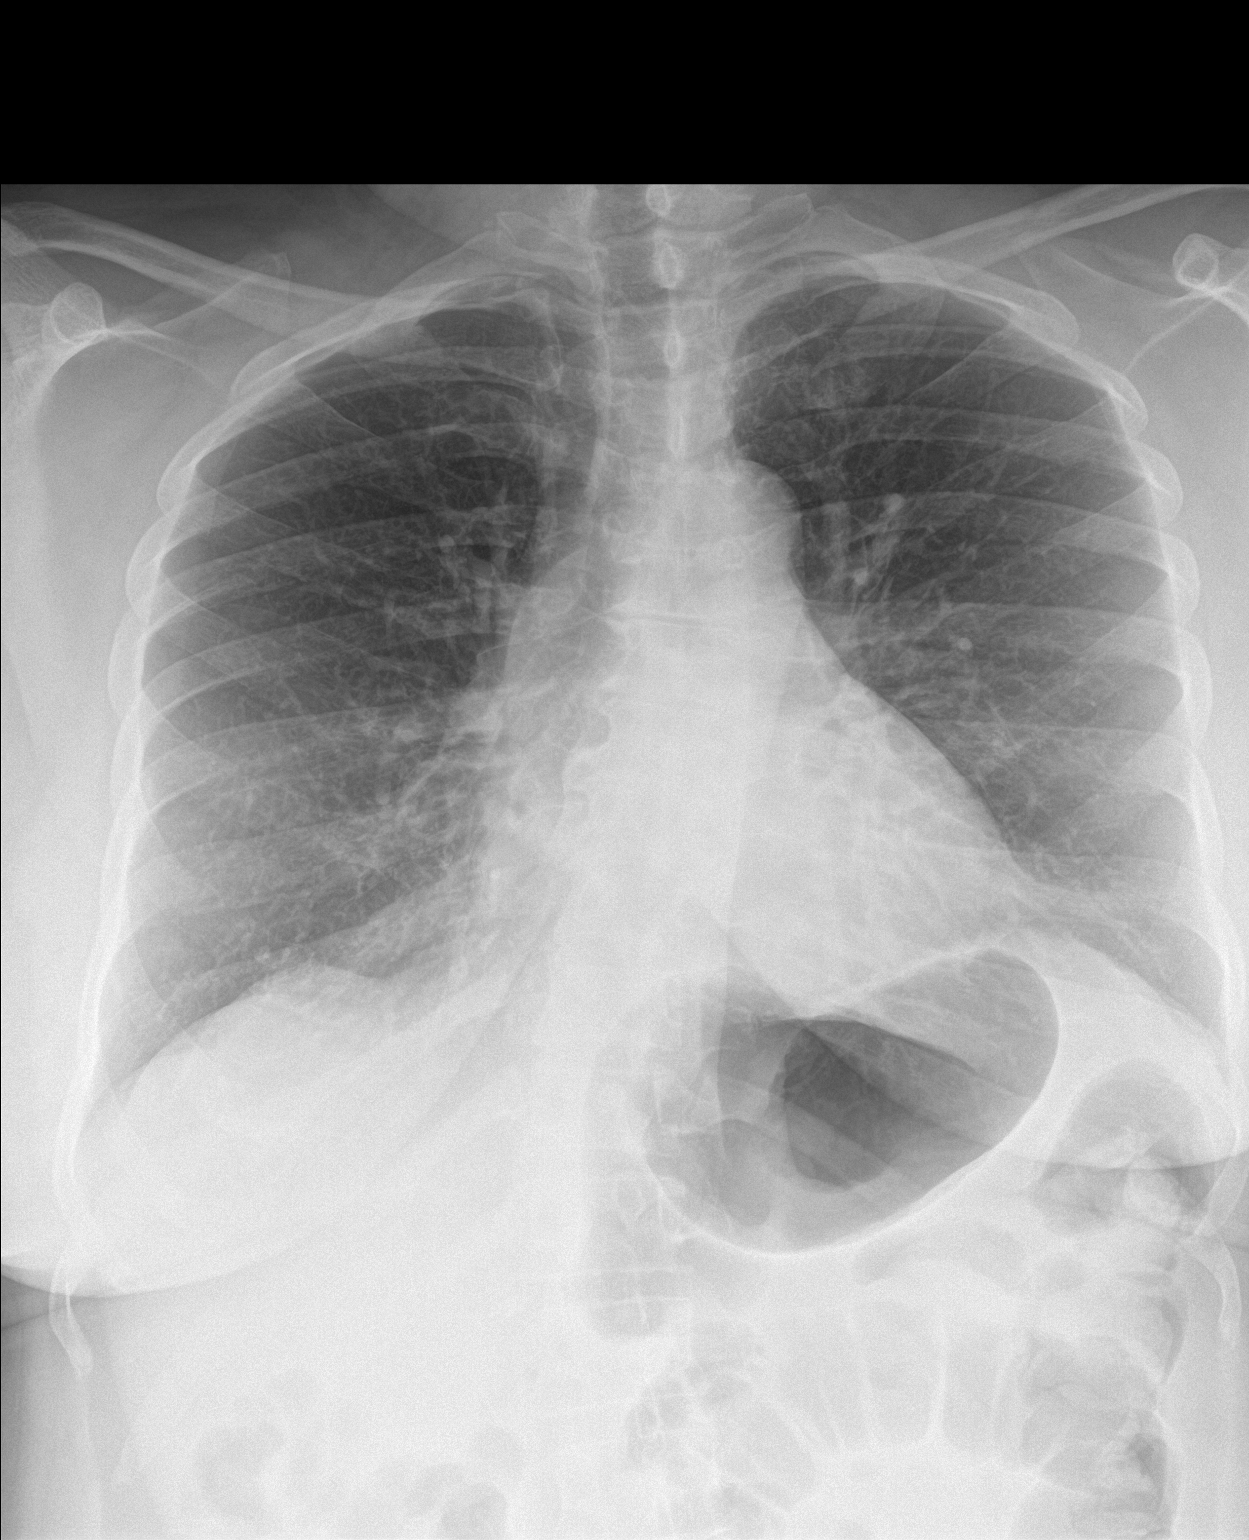

[chest lat]
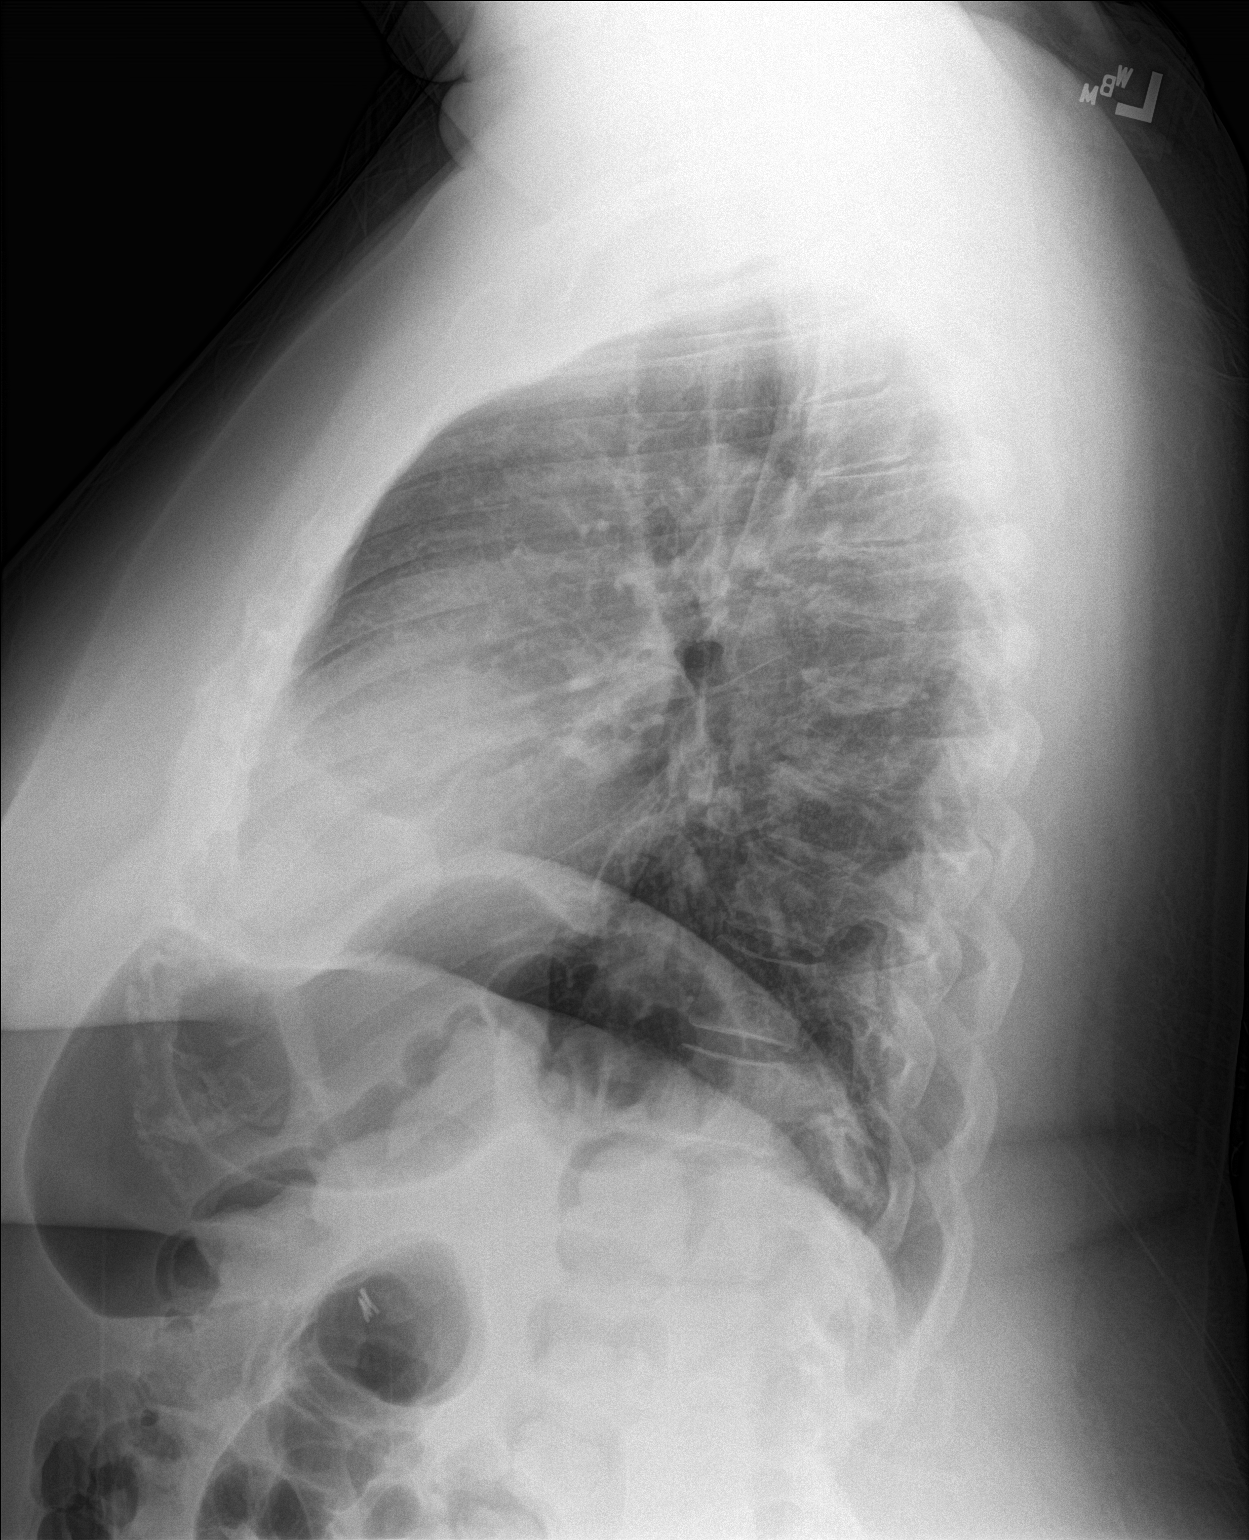

[chest pa (2 of 2)]
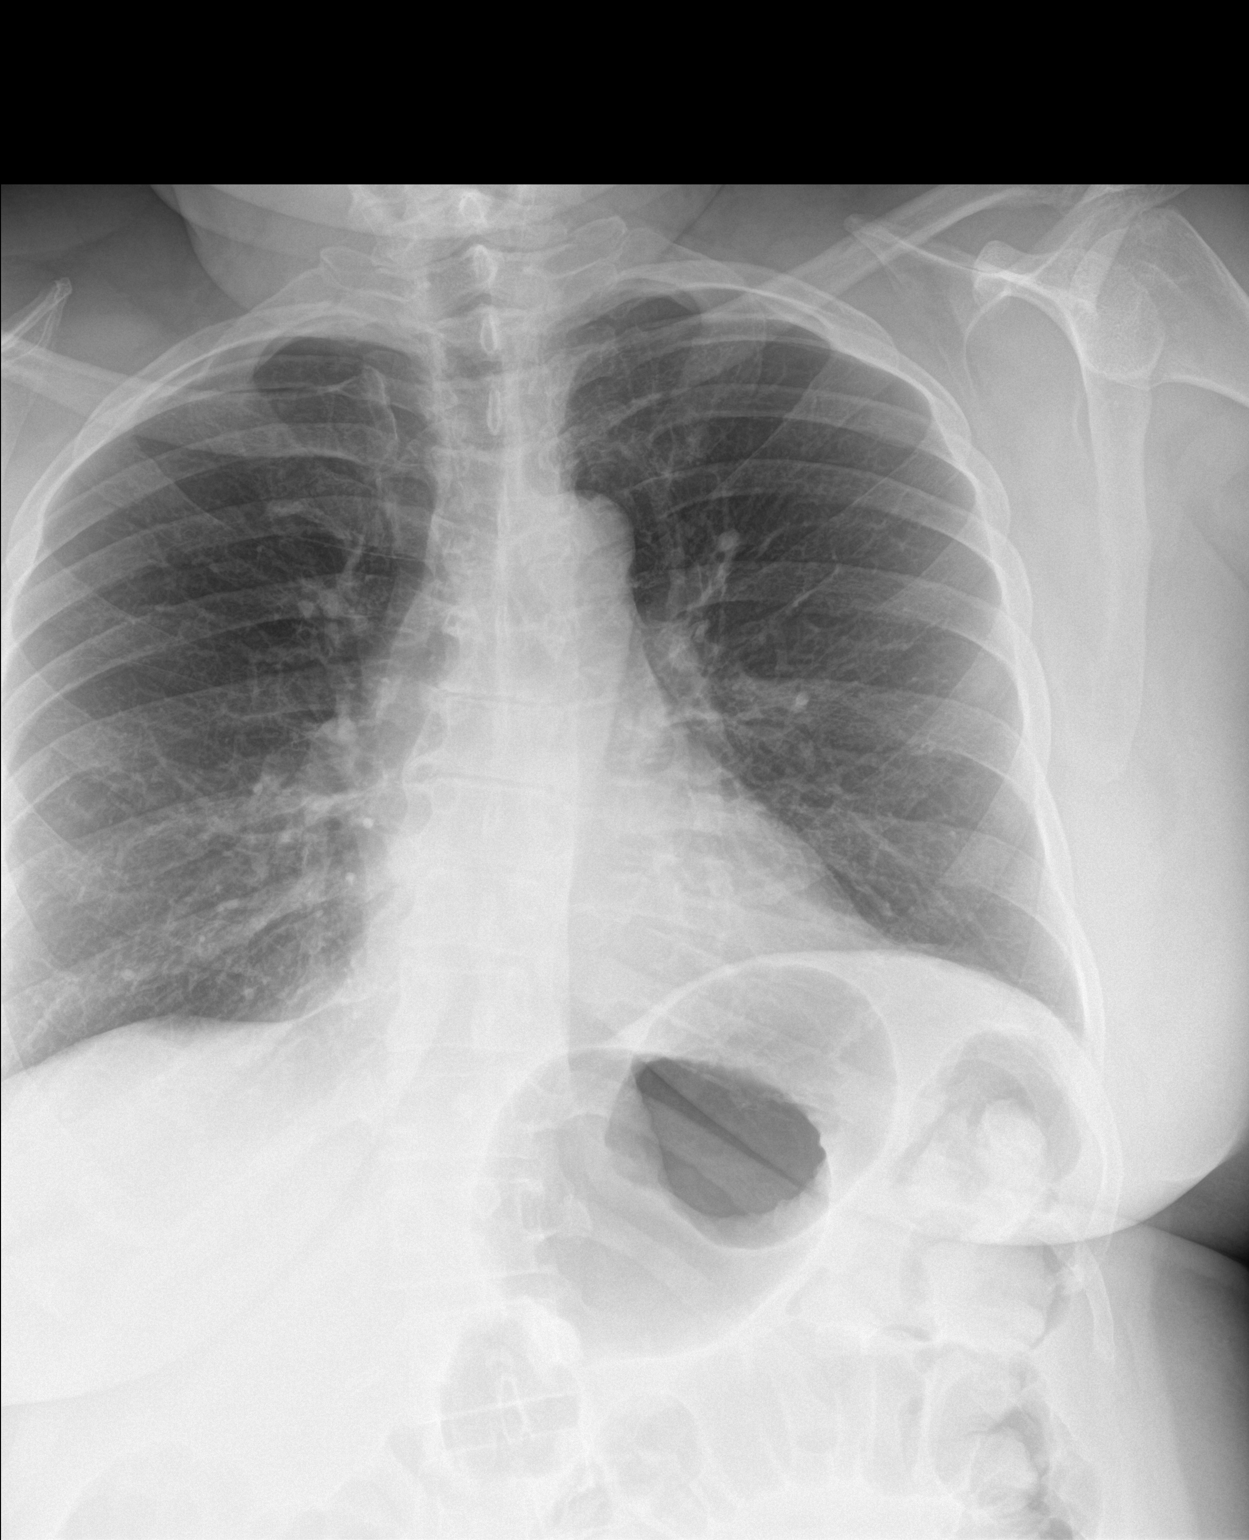

[3 of 3 positions shown; findings below may reference images not displayed]

FINDINGS: Cardiomediastinal silhouette within normal limits in size and
contour. No evidence of central vascular congestion. No interlobular
septal thickening.

No pneumothorax or pleural effusion. Coarsened interstitial
markings, with no confluent airspace disease.

No acute displaced fracture. Degenerative changes of the spine.
IMPRESSION: No definite evidence of acute cardiopulmonary disease

## 2023-09-01 ENCOUNTER — Ambulatory Visit: Admitting: Family Medicine

## 2023-09-01 VITALS — BP 147/91 | HR 73 | Ht 65.0 in | Wt 215.0 lb

## 2023-09-01 DIAGNOSIS — M9902 Segmental and somatic dysfunction of thoracic region: Secondary | ICD-10-CM | POA: Diagnosis not present

## 2023-09-01 DIAGNOSIS — M9909 Segmental and somatic dysfunction of abdomen and other regions: Secondary | ICD-10-CM | POA: Diagnosis not present

## 2023-09-01 DIAGNOSIS — M9908 Segmental and somatic dysfunction of rib cage: Secondary | ICD-10-CM

## 2023-09-01 DIAGNOSIS — M9904 Segmental and somatic dysfunction of sacral region: Secondary | ICD-10-CM

## 2023-09-01 DIAGNOSIS — M5416 Radiculopathy, lumbar region: Secondary | ICD-10-CM | POA: Diagnosis not present

## 2023-09-01 DIAGNOSIS — M9903 Segmental and somatic dysfunction of lumbar region: Secondary | ICD-10-CM

## 2023-09-01 DIAGNOSIS — M9900 Segmental and somatic dysfunction of head region: Secondary | ICD-10-CM | POA: Diagnosis not present

## 2023-09-01 DIAGNOSIS — M9905 Segmental and somatic dysfunction of pelvic region: Secondary | ICD-10-CM

## 2023-09-01 DIAGNOSIS — M9901 Segmental and somatic dysfunction of cervical region: Secondary | ICD-10-CM

## 2023-09-01 NOTE — Assessment & Plan Note (Signed)
 Not doing great. Has done PT without significant improvement. She is very tight. We will get her into PM&R- referral was placed and we'll check on status. She does have somatic dysfunction that is contributing to her symptoms. Treated today with good results as below. Call with any concerns.

## 2023-09-01 NOTE — Progress Notes (Signed)
 BP (!) 147/91 (BP Location: Left Arm, Patient Position: Sitting, Cuff Size: Large)   Pulse 73   Ht 5\' 5"  (1.651 m)   Wt 215 lb (97.5 kg)   LMP  (LMP Unknown)   SpO2 98%   BMI 35.78 kg/m    Subjective:    Patient ID: Tracie Sheppard, female    DOB: 03-May-1955, 68 y.o.   MRN: [id number]  HPI: Tracie Sheppard is a 68 y.o. female  Chief Complaint  Patient presents with   Back Pain   Tracie Sheppard presents today for evaluation and possible treatment with OMT for low back pain. She notes that her knee continues to hurt. She notes that it has not gotten any better. She notes that her meloxicam  helps during the day but is wearing off at night. She has not been taking anything else. She is unsure about going to see ortho as she's anxious about a shot. She notes that she is feeling a bit better since last visit with her back. Pain is in her low back, better with OMT and worse with stress and certain movements. Pain radiates into her L leg. She is otherwise feeling well with no other concerns or complaints at this time.   Relevant past medical, surgical, family and social history reviewed and updated as indicated. Interim medical history since our last visit reviewed. Allergies and medications reviewed and updated.  Review of Systems  Constitutional: Negative.   Respiratory: Negative.    Cardiovascular: Negative.   Musculoskeletal:  Positive for arthralgias, back pain and myalgias. Negative for gait problem, joint swelling, neck pain and neck stiffness.  Skin: Negative.   Neurological: Negative.   Psychiatric/Behavioral: Negative.      Per HPI unless specifically indicated above     Objective:     BP (!) 147/91 (BP Location: Left Arm, Patient Position: Sitting, Cuff Size: Large)   Pulse 73   Ht 5\' 5"  (1.651 m)   Wt 215 lb (97.5 kg)   LMP  (LMP Unknown)   SpO2 98%   BMI 35.78 kg/m   Wt Readings from Last 3 Encounters:  09/01/23 215 lb (97.5 kg)  07/21/23 215 lb 9.6 oz (97.8 kg)   05/12/23 225 lb (102.1 kg)    Physical Exam Vitals and nursing note reviewed.  Constitutional:      General: She is not in acute distress.    Appearance: Normal appearance. She is not ill-appearing.  HENT:     Head: Normocephalic and atraumatic.     Right Ear: External ear normal.     Left Ear: External ear normal.     Nose: Nose normal.     Mouth/Throat:     Mouth: Mucous membranes are moist.     Pharynx: Oropharynx is clear.  Eyes:     Extraocular Movements: Extraocular movements intact.     Conjunctiva/sclera: Conjunctivae normal.     Pupils: Pupils are equal, round, and reactive to light.  Neck:     Vascular: No carotid bruit.  Cardiovascular:     Rate and Rhythm: Normal rate.     Pulses: Normal pulses.  Pulmonary:     Effort: Pulmonary effort is normal. No respiratory distress.  Abdominal:     General: Abdomen is flat. There is no distension.     Palpations: Abdomen is soft. There is no mass.     Tenderness: There is no abdominal tenderness. There is no right CVA tenderness, left CVA tenderness, guarding or rebound.  Hernia: No hernia is present.  Musculoskeletal:     Cervical back: No muscular tenderness.  Lymphadenopathy:     Cervical: No cervical adenopathy.  Skin:    General: Skin is warm and dry.     Capillary Refill: Capillary refill takes less than 2 seconds.     Coloration: Skin is not jaundiced or pale.     Findings: No bruising, erythema, lesion or rash.  Neurological:     General: No focal deficit present.     Mental Status: She is alert. Mental status is at baseline.  Psychiatric:        Mood and Affect: Mood normal.        Behavior: Behavior normal.        Thought Content: Thought content normal.        Judgment: Judgment normal.   Musculoskeletal:  Exam found Decreased ROM, Tissue texture changes, Tenderness to palpation, and Asymmetry of patient's  head, neck, thorax, ribs, lumbar, pelvis, sacrum, and abdomen Osteopathic Structural Exam:    Head: OAESSR, hypertonic suboccipital muscles  Neck: C4ESRR, SCM hypertonic on the R  Thorax: T4-6SLRR  Ribs: ribs 5-7 locked up on the R, Rib 6 locked up on the L  Lumbar: QL hypertonic on the R  Pelvis: Anterior R innominate, SI joint restricted on the R  Sacrum: R on R torsion  Abdomen: diaphragm hypertonic bilaterally R>L   Results for orders placed or performed in visit on 04/21/23  Cytology - PAP   Collection Time: 04/21/23  9:19 AM  Result Value Ref Range   High risk HPV Negative    Adequacy      Satisfactory for evaluation; transformation zone component PRESENT.   Diagnosis      - Negative for intraepithelial lesion or malignancy (NILM)   Comment Normal Reference Range HPV - Negative   Microalbumin, Urine Waived   Collection Time: 04/21/23 10:12 AM  Result Value Ref Range   Microalb, Ur Waived 80 (H) 0 - 19 mg/L   Creatinine, Urine Waived 300 10 - 300 mg/dL   Microalb/Creat Ratio 30-300 (H) <30 mg/g  Bayer DCA Hb A1c Waived   Collection Time: 04/21/23 10:12 AM  Result Value Ref Range   HB A1C (BAYER DCA - WAIVED) 6.1 (H) 4.8 - 5.6 %  CBC with Differential/Platelet   Collection Time: 04/21/23 10:13 AM  Result Value Ref Range   WBC 8.4 3.4 - 10.8 x10E3/uL   RBC 4.31 3.77 - 5.28 x10E6/uL   Hemoglobin 13.1 11.1 - 15.9 g/dL   Hematocrit 09.8 11.9 - 46.6 %   MCV 94 79 - 97 fL   MCH 30.4 26.6 - 33.0 pg   MCHC 32.4 31.5 - 35.7 g/dL   RDW 14.7 82.9 - 56.2 %   Platelets 263 150 - 450 x10E3/uL   Neutrophils 43 Not Estab. %   Lymphs 46 Not Estab. %   Monocytes 8 Not Estab. %   Eos 2 Not Estab. %   Basos 1 Not Estab. %   Neutrophils Absolute 3.6 1.4 - 7.0 x10E3/uL   Lymphocytes Absolute 3.9 (H) 0.7 - 3.1 x10E3/uL   Monocytes Absolute 0.6 0.1 - 0.9 x10E3/uL   EOS (ABSOLUTE) 0.2 0.0 - 0.4 x10E3/uL   Basophils Absolute 0.1 0.0 - 0.2 x10E3/uL   Immature Granulocytes 0 Not Estab. %   Immature Grans (Abs) 0.0 0.0 - 0.1 x10E3/uL  Comprehensive metabolic panel    Collection Time: 04/21/23 10:13 AM  Result Value Ref Range   Glucose  90 70 - 99 mg/dL   BUN 15 8 - 27 mg/dL   Creatinine, Ser 8.65 (H) 0.57 - 1.00 mg/dL   eGFR 58 (L) >78 IO/NGE/9.52   BUN/Creatinine Ratio 14 12 - 28   Sodium 139 134 - 144 mmol/L   Potassium 4.4 3.5 - 5.2 mmol/L   Chloride 101 96 - 106 mmol/L   CO2 22 20 - 29 mmol/L   Calcium  9.2 8.7 - 10.3 mg/dL   Total Protein 7.0 6.0 - 8.5 g/dL   Albumin 4.3 3.9 - 4.9 g/dL   Globulin, Total 2.7 1.5 - 4.5 g/dL   Bilirubin Total 0.8 0.0 - 1.2 mg/dL   Alkaline Phosphatase 82 44 - 121 IU/L   AST 32 0 - 40 IU/L   ALT 45 (H) 0 - 32 IU/L  Lipid Panel w/o Chol/HDL Ratio   Collection Time: 04/21/23 10:13 AM  Result Value Ref Range   Cholesterol, Total 247 (H) 100 - 199 mg/dL   Triglycerides 99 0 - 149 mg/dL   HDL 50 >84 mg/dL   VLDL Cholesterol Cal 17 5 - 40 mg/dL   LDL Chol Calc (NIH) 132 (H) 0 - 99 mg/dL  TSH   Collection Time: 04/21/23 10:13 AM  Result Value Ref Range   TSH 1.500 0.450 - 4.500 uIU/mL      Assessment & Plan:   Problem List Items Addressed This Visit       Nervous and Auditory   Lumbar radiculopathy - Primary   Not doing great. Has done PT without significant improvement. She is very tight. We will get her into PM&R- referral was placed and we'll check on status. She does have somatic dysfunction that is contributing to her symptoms. Treated today with good results as below. Call with any concerns.       Other Visit Diagnoses       Head region somatic dysfunction         Cervical segment dysfunction         Thoracic segment dysfunction         Somatic dysfunction of sacral region         Somatic dysfunction of pelvis region         Rib cage region somatic dysfunction         Segmental dysfunction of abdomen         Somatic dysfunction of lumbar region          After verbal consent was obtained, patient was treated today with osteopathic manipulative medicine to the regions of the head, neck, thorax,  ribs, lumbar, pelvis, sacrum, and abdomen using the techniques of cranial, myofascial release, counterstrain, muscle energy, HVLA, and soft tissue. Areas of compensation relating to her primary pain source also treated. Patient tolerated the procedure well with good objective and good subjective improvement in symptoms. She left the room in good condition. She was advised to stay well hydrated and that she may have some soreness following the procedure. If not improving or worsening, she will call and come in. She will return for reevaluation  in 1-2 months.   Follow up plan: Return 6-8 weeks, for I am not here 7/11- please reschedule.

## 2023-09-01 NOTE — Patient Instructions (Signed)
 [facility] PM&R Drs. Chasnis and [facility] [address] [place], [place] [address] Appointments [phone]

## 2023-09-19 ENCOUNTER — Ambulatory Visit: Admitting: Family Medicine

## 2023-09-20 ENCOUNTER — Ambulatory Visit: Admitting: Orthopedic Surgery

## 2023-09-20 ENCOUNTER — Encounter: Payer: Self-pay | Admitting: Orthopedic Surgery

## 2023-09-20 DIAGNOSIS — M25562 Pain in left knee: Secondary | ICD-10-CM | POA: Diagnosis not present

## 2023-09-20 NOTE — Progress Notes (Signed)
 New Patient Visit  Assessment: Tracie Sheppard is a 68 y.o. female with the following: Left knee pain; s/p fall 2+ months ago  Plan: [name] has pain in the left knee, primarily anterior after a fall.  Incidentally she has calcification in posterior knee on XR, which could be a Baker's cyst.  She has some pain in the posterior knee.  Provided reassurance.  Discussed multiple treatment options.  She is not interested in an injection at this time.  She will continue with medications as needed.  Ice the knee.  Return in 6 weeks for repeat evaluation.  At that time, we can consider injections.  We can also consider an MRI.  Follow-up: Return in about 6 weeks (around 11/01/2023).  Subjective:  Chief Complaint  Patient presents with   Knee Pain    Left knee pain    History of Present Illness: Tracie Sheppard is a 68 y.o. female who presents for evaluation of left knee pain.  She reports that she fell, directly on the anterior aspect of the left knee approximately 2 months ago.  She notes a small laceration.  She has swelling, continues to have pain.  She recently had an x-ray, which demonstrated some calcifications in the posterior aspect of the knee.  She has some pain in the posterior aspect of her knee.  Takes pain medicines occasionally.  No prior injections.  She has not therapy.   Review of Systems: No fevers or chills No numbness or tingling No chest pain No shortness of breath No bowel or bladder dysfunction No GI distress No headaches   Medical History:  Past Medical History:  Diagnosis Date   Allergy    Seasonal   Anxiety    Bilateral carpal tunnel syndrome 05/30/2017   Chronic back pain    Depression, major, recurrent, moderate (HCC)    Diabetes mellitus without complication (HCC)    Prediabetic   High serum testosterone    HTN (hypertension) 09/12/2019   Kidney stone    Sleep apnea    Thyroid  disease    did not show up in lab work but per other symptoms  previous doctor started her on this    Past Surgical History:  Procedure Laterality Date   CHOLECYSTECTOMY     1997   COLONOSCOPY WITH PROPOFOL  N/A 11/16/2018   Procedure: COLONOSCOPY WITH PROPOFOL ;  Surgeon: Unk Corinn Skiff, MD;  Location: [facility] ENDOSCOPY;  Service: Gastroenterology;  Laterality: N/A;   CYSTOSCOPY  09/03/2021   ESOPHAGOGASTRODUODENOSCOPY (EGD) WITH PROPOFOL  N/A 11/16/2018   Procedure: ESOPHAGOGASTRODUODENOSCOPY (EGD) WITH PROPOFOL ;  Surgeon: Unk Corinn Skiff, MD;  Location: [facility] ENDOSCOPY;  Service: Gastroenterology;  Laterality: N/A;   NASAL SEPTUM SURGERY     OOPHORECTOMY     TUBAL LIGATION     1989    Family History  Problem Relation Age of Onset   Diabetes Mother    Anemia Mother    Hyperlipidemia Mother    Arthritis Mother    Hypertension Father    Cancer Father    Celiac disease Sister    Diabetes Maternal Grandmother    Stroke Maternal Grandmother    Heart attack Maternal Grandmother    Heart disease Paternal Grandfather    Social History   Tobacco Use   Smoking status: Never   Smokeless tobacco: Never  Vaping Use   Vaping status: Never Used  Substance Use Topics   Alcohol use: Yes    Comment: On occasion   Drug use: No  Allergies  Allergen Reactions   Aleve  [Naproxen  Sodium] Other (See Comments)    Chest pain   Statins Other (See Comments)    myalgias    No outpatient medications have been marked as taking for the 09/20/23 encounter (Office Visit) with Onesimo Oneil LABOR, MD.    Objective: LMP  (LMP Unknown)   Physical Exam:  General: Alert and oriented. and No acute distress. Gait: Left sided antalgic gait.  Evaluation of the left knee demonstrates a healed superficial abrasion.  There is a small amount of swelling of the anterior knee.  There is a fullness in the posterior knee, with some tenderness.  Knee is stable to varus and valgus stress.  Negative Lachman.  No tenderness to palpation along the medial or lateral joint  line.  IMAGING: I personally reviewed images previously obtained in clinic  Left knee with mild degenerative changes.  Calcification in the posterior aspect of the knee which could represent a Baker's cyst.   New Medications:  No orders of the defined types were placed in this encounter.     Oneil LABOR Onesimo, MD  09/20/2023 4:43 PM

## 2023-10-13 ENCOUNTER — Ambulatory Visit: Admitting: Family Medicine

## 2023-10-19 ENCOUNTER — Ambulatory Visit: Admitting: Family Medicine

## 2023-10-19 VITALS — BP 152/88 | HR 70 | Temp 98.2°F | Ht 65.0 in | Wt 218.3 lb

## 2023-10-19 DIAGNOSIS — M9908 Segmental and somatic dysfunction of rib cage: Secondary | ICD-10-CM

## 2023-10-19 DIAGNOSIS — M9906 Segmental and somatic dysfunction of lower extremity: Secondary | ICD-10-CM | POA: Diagnosis not present

## 2023-10-19 DIAGNOSIS — M9905 Segmental and somatic dysfunction of pelvic region: Secondary | ICD-10-CM | POA: Diagnosis not present

## 2023-10-19 DIAGNOSIS — M9903 Segmental and somatic dysfunction of lumbar region: Secondary | ICD-10-CM | POA: Diagnosis not present

## 2023-10-19 DIAGNOSIS — M9904 Segmental and somatic dysfunction of sacral region: Secondary | ICD-10-CM | POA: Diagnosis not present

## 2023-10-19 DIAGNOSIS — M5416 Radiculopathy, lumbar region: Secondary | ICD-10-CM | POA: Diagnosis not present

## 2023-10-19 DIAGNOSIS — M9902 Segmental and somatic dysfunction of thoracic region: Secondary | ICD-10-CM

## 2023-10-19 DIAGNOSIS — M9909 Segmental and somatic dysfunction of abdomen and other regions: Secondary | ICD-10-CM

## 2023-10-19 NOTE — Progress Notes (Signed)
 BP (!) 152/88   Pulse 70   Temp 98.2 F (36.8 C) (Oral)   Ht 5' 5 (1.651 m)   Wt 218 lb 4.8 oz (99 kg)   LMP  (LMP Unknown)   SpO2 96%   BMI 36.33 kg/m    Subjective:    Patient ID: Tracie Sheppard, female    DOB: 02-08-1956, 68 y.o.   MRN: [id number]  HPI: Tracie Sheppard is a 68 y.o. female  Chief Complaint  Patient presents with   Back Pain   Zoiee presents today for evaluation and possible treatment with OMT for low back pain. She notes that her pain is aching and sore. It's located in her low back and into her hips. Better with OMT and worse with her knee pain and walking. Pain radiates into her leg. She has otherwise been doing OK with no other concerns or complaints at this time.   Relevant past medical, surgical, family and social history reviewed and updated as indicated. Interim medical history since our last visit reviewed. Allergies and medications reviewed and updated.  Review of Systems  Constitutional: Negative.   Respiratory: Negative.    Cardiovascular: Negative.   Musculoskeletal:  Positive for arthralgias, back pain and myalgias. Negative for gait problem, joint swelling, neck pain and neck stiffness.  Skin: Negative.   Neurological: Negative.   Psychiatric/Behavioral: Negative.      Per HPI unless specifically indicated above     Objective:    BP (!) 152/88   Pulse 70   Temp 98.2 F (36.8 C) (Oral)   Ht 5' 5 (1.651 m)   Wt 218 lb 4.8 oz (99 kg)   LMP  (LMP Unknown)   SpO2 96%   BMI 36.33 kg/m   Wt Readings from Last 3 Encounters:  10/19/23 218 lb 4.8 oz (99 kg)  09/01/23 215 lb (97.5 kg)  07/21/23 215 lb 9.6 oz (97.8 kg)    Physical Exam Vitals and nursing note reviewed.  Constitutional:      General: She is not in acute distress.    Appearance: Normal appearance. She is not ill-appearing.  HENT:     Head: Normocephalic and atraumatic.     Right Ear: External ear normal.     Left Ear: External ear normal.     Nose: Nose normal.      Mouth/Throat:     Mouth: Mucous membranes are moist.     Pharynx: Oropharynx is clear.  Eyes:     Extraocular Movements: Extraocular movements intact.     Conjunctiva/sclera: Conjunctivae normal.     Pupils: Pupils are equal, round, and reactive to light.  Neck:     Vascular: No carotid bruit.  Cardiovascular:     Rate and Rhythm: Normal rate.     Pulses: Normal pulses.  Pulmonary:     Effort: Pulmonary effort is normal. No respiratory distress.  Abdominal:     General: Abdomen is flat. There is no distension.     Palpations: Abdomen is soft. There is no mass.     Tenderness: There is no abdominal tenderness. There is no right CVA tenderness, left CVA tenderness, guarding or rebound.     Hernia: No hernia is present.  Musculoskeletal:     Cervical back: No muscular tenderness.  Lymphadenopathy:     Cervical: No cervical adenopathy.  Skin:    General: Skin is warm and dry.     Capillary Refill: Capillary refill takes less than 2 seconds.  Coloration: Skin is not jaundiced or pale.     Findings: No bruising, erythema, lesion or rash.  Neurological:     General: No focal deficit present.     Mental Status: She is alert. Mental status is at baseline.  Psychiatric:        Mood and Affect: Mood normal.        Behavior: Behavior normal.        Thought Content: Thought content normal.        Judgment: Judgment normal.   Musculoskeletal:  Exam found Decreased ROM, Tissue texture changes, Tenderness to palpation, and Asymmetry of patient's  thorax, ribs, lumbar, pelvis, sacrum, lower extremity, and abdomen Osteopathic Structural Exam:   Thorax: T3-5SRRL  Ribs: Ribs 5-6 locked up on the R, Rib  7 locked up on the L  Lumbar: QL hypertonic on the R, L3-5SLRR  Pelvis: Posterior R innominate, SI joint restricted on the R  Sacrum: R on R torsion  Lower Extremity: IT hypertonic bilaterally, L>R  Abdomen: diaphragm hypertonic bilaterally R>L  Results for orders placed or  performed in visit on 04/21/23  Cytology - PAP   Collection Time: 04/21/23  9:19 AM  Result Value Ref Range   High risk HPV Negative    Adequacy      Satisfactory for evaluation; transformation zone component PRESENT.   Diagnosis      - Negative for intraepithelial lesion or malignancy (NILM)   Comment Normal Reference Range HPV - Negative   Microalbumin, Urine Waived   Collection Time: 04/21/23 10:12 AM  Result Value Ref Range   Microalb, Ur Waived 80 (H) 0 - 19 mg/L   Creatinine, Urine Waived 300 10 - 300 mg/dL   Microalb/Creat Ratio 30-300 (H) <30 mg/g  Bayer DCA Hb A1c Waived   Collection Time: 04/21/23 10:12 AM  Result Value Ref Range   HB A1C (BAYER DCA - WAIVED) 6.1 (H) 4.8 - 5.6 %  CBC with Differential/Platelet   Collection Time: 04/21/23 10:13 AM  Result Value Ref Range   WBC 8.4 3.4 - 10.8 x10E3/uL   RBC 4.31 3.77 - 5.28 x10E6/uL   Hemoglobin 13.1 11.1 - 15.9 g/dL   Hematocrit 59.5 65.9 - 46.6 %   MCV 94 79 - 97 fL   MCH 30.4 26.6 - 33.0 pg   MCHC 32.4 31.5 - 35.7 g/dL   RDW 87.6 88.2 - 84.5 %   Platelets 263 150 - 450 x10E3/uL   Neutrophils 43 Not Estab. %   Lymphs 46 Not Estab. %   Monocytes 8 Not Estab. %   Eos 2 Not Estab. %   Basos 1 Not Estab. %   Neutrophils Absolute 3.6 1.4 - 7.0 x10E3/uL   Lymphocytes Absolute 3.9 (H) 0.7 - 3.1 x10E3/uL   Monocytes Absolute 0.6 0.1 - 0.9 x10E3/uL   EOS (ABSOLUTE) 0.2 0.0 - 0.4 x10E3/uL   Basophils Absolute 0.1 0.0 - 0.2 x10E3/uL   Immature Granulocytes 0 Not Estab. %   Immature Grans (Abs) 0.0 0.0 - 0.1 x10E3/uL  Comprehensive metabolic panel   Collection Time: 04/21/23 10:13 AM  Result Value Ref Range   Glucose 90 70 - 99 mg/dL   BUN 15 8 - 27 mg/dL   Creatinine, Ser 8.92 (H) 0.57 - 1.00 mg/dL   eGFR 58 (L) >40 fO/fpw/8.26   BUN/Creatinine Ratio 14 12 - 28   Sodium 139 134 - 144 mmol/L   Potassium 4.4 3.5 - 5.2 mmol/L   Chloride 101 96 - 106  mmol/L   CO2 22 20 - 29 mmol/L   Calcium  9.2 8.7 - 10.3 mg/dL    Total Protein 7.0 6.0 - 8.5 g/dL   Albumin 4.3 3.9 - 4.9 g/dL   Globulin, Total 2.7 1.5 - 4.5 g/dL   Bilirubin Total 0.8 0.0 - 1.2 mg/dL   Alkaline Phosphatase 82 44 - 121 IU/L   AST 32 0 - 40 IU/L   ALT 45 (H) 0 - 32 IU/L  Lipid Panel w/o Chol/HDL Ratio   Collection Time: 04/21/23 10:13 AM  Result Value Ref Range   Cholesterol, Total 247 (H) 100 - 199 mg/dL   Triglycerides 99 0 - 149 mg/dL   HDL 50 >60 mg/dL   VLDL Cholesterol Cal 17 5 - 40 mg/dL   LDL Chol Calc (NIH) 819 (H) 0 - 99 mg/dL  TSH   Collection Time: 04/21/23 10:13 AM  Result Value Ref Range   TSH 1.500 0.450 - 4.500 uIU/mL      Assessment & Plan:   Problem List Items Addressed This Visit       Nervous and Auditory   Lumbar radiculopathy - Primary   She does have somatic dysfunction that is contributing to her symptoms. Treated today with good results as below. Call with any concerns.       Other Visit Diagnoses       Thoracic segment dysfunction         Somatic dysfunction of sacral region         Somatic dysfunction of pelvis region         Rib cage region somatic dysfunction         Segmental dysfunction of abdomen         Somatic dysfunction of lumbar region         Somatic dysfunction of lower extremities           After verbal consent was obtained, patient was treated today with osteopathic manipulative medicine to the regions of the thorax, ribs, lumbar, pelvis, sacrum, abdomen, and lower extremity using the techniques of myofascial release, counterstrain, muscle energy, HVLA, and soft tissue. Areas of compensation relating to her primary pain source also treated. Patient tolerated the procedure well with good objective and good subjective improvement in symptoms. She left the room in good condition. She was advised to stay well hydrated and that she may have some soreness following the procedure. If not improving or worsening, she will call and come in. She will return for reevaluation  on a PRN  basis.  Follow up plan: Return in about 4 weeks (around 11/16/2023).

## 2023-10-19 NOTE — Assessment & Plan Note (Signed)
She does have somatic dysfunction that is contributing to her symptoms. Treated today with good results as below. Call with any concerns.  

## 2023-11-01 ENCOUNTER — Ambulatory Visit: Admitting: Orthopedic Surgery

## 2023-11-01 ENCOUNTER — Encounter: Payer: Self-pay | Admitting: Orthopedic Surgery

## 2023-11-01 VITALS — BP 157/84 | Ht 64.0 in | Wt 218.0 lb

## 2023-11-01 DIAGNOSIS — G8929 Other chronic pain: Secondary | ICD-10-CM | POA: Diagnosis not present

## 2023-11-01 DIAGNOSIS — M25562 Pain in left knee: Secondary | ICD-10-CM

## 2023-11-01 MED ORDER — METHYLPREDNISOLONE ACETATE 40 MG/ML IJ SUSP
40.0000 mg | Freq: Once | INTRAMUSCULAR | Status: AC
  Administered 2023-11-01: 40 mg via INTRAMUSCULAR

## 2023-11-01 NOTE — Addendum Note (Signed)
 Addended by: RODGERS LACY on: 11/01/2023 04:23 PM   Modules accepted: Orders

## 2023-11-01 NOTE — Patient Instructions (Signed)

## 2023-11-01 NOTE — Progress Notes (Signed)
 New Patient Visit  Assessment: Tracie Sheppard is a 68 y.o. female with the following: Left knee pain; s/p fall 3-4 months ago  Plan: Tracie Sheppard continues to have pain in the left knee.  Pain is now starting to radiate distally, as well as proximally.  There is crepitus with range of motion testing.  There is some calcification and a potential cyst the posterior aspect of the knee.  We discussed multiple treatment options.  She elected to proceed with an injection.  I would like to see her back in 6 weeks.  If she continues to have problems at that point, would recommend an MRI.  She states understanding.  I will see her in 6 weeks.   Procedure note injection Left knee joint   Verbal consent was obtained to inject the left knee joint  Timeout was completed to confirm the site of injection.  The skin was prepped with alcohol and ethyl chloride was sprayed at the injection site.  A 21-gauge needle was used to inject 40 mg of Depo-Medrol  and 1% lidocaine  (4 cc) into the left knee using an anterolateral approach.  There were no complications. A sterile bandage was applied.     Follow-up: Return in about 6 weeks (around 12/13/2023).  Subjective:  Chief Complaint  Patient presents with   Knee Pain    Left knee pain taking meloxicam  is not working  she fell April 1 and injured that knee      History of Present Illness: Tracie Sheppard is a 68 y.o. female who returns for evaluation of left knee pain.  She fell directly onto the left knee approximately 4 months ago.  She continues to have pain.  Pain is radiating proximally and distally.  She has tried meloxicam  on a consistent basis.  This was not effective.  She notes pain when she lays down at nighttime.  She continues to have some mild discomfort in the posterior aspect of the knee.  Review of Systems: No fevers or chills No numbness or tingling No chest pain No shortness of breath No bowel or bladder dysfunction No GI distress No  headaches    Objective: BP (!) 157/84   Ht 5' 4 (1.626 m)   Wt 218 lb (98.9 kg)   LMP  (LMP Unknown)   BMI 37.42 kg/m   Physical Exam:  General: Alert and oriented. and No acute distress. Gait: Left sided antalgic gait.  Evaluation of the left knee demonstrates a healed superficial abrasion.  There is a small amount of swelling of the anterior knee.  There is a fullness in the posterior knee, with some tenderness.  Knee is stable to varus and valgus stress.  Negative Lachman.  Mild tenderness along the medial joint line.  There is crepitus with range of motion testing.   IMAGING: I personally reviewed images previously obtained in clinic  Left knee with mild degenerative changes.  Calcification in the posterior aspect of the knee which could represent a Baker's cyst.   New Medications:  No orders of the defined types were placed in this encounter.     Oneil DELENA Horde, MD  11/01/2023 2:34 PM

## 2023-11-02 ENCOUNTER — Ambulatory Visit: Admitting: Orthopedic Surgery

## 2023-11-06 DIAGNOSIS — Z780 Asymptomatic menopausal state: Secondary | ICD-10-CM | POA: Diagnosis not present

## 2023-11-06 DIAGNOSIS — M8588 Other specified disorders of bone density and structure, other site: Secondary | ICD-10-CM | POA: Diagnosis not present

## 2023-11-06 DIAGNOSIS — Z1231 Encounter for screening mammogram for malignant neoplasm of breast: Secondary | ICD-10-CM | POA: Diagnosis not present

## 2023-11-06 LAB — HM MAMMOGRAPHY

## 2023-11-06 LAB — HM DEXA SCAN

## 2023-11-07 ENCOUNTER — Encounter: Payer: Self-pay | Admitting: Family Medicine

## 2023-11-24 ENCOUNTER — Ambulatory Visit: Admitting: Family Medicine

## 2023-11-24 ENCOUNTER — Other Ambulatory Visit: Payer: Self-pay | Admitting: Family Medicine

## 2023-11-24 ENCOUNTER — Encounter: Payer: Self-pay | Admitting: Family Medicine

## 2023-11-24 VITALS — BP 135/79 | HR 76 | Temp 97.6°F | Ht 64.0 in | Wt 218.0 lb

## 2023-11-24 DIAGNOSIS — Z1211 Encounter for screening for malignant neoplasm of colon: Secondary | ICD-10-CM | POA: Diagnosis not present

## 2023-11-24 DIAGNOSIS — R7301 Impaired fasting glucose: Secondary | ICD-10-CM | POA: Diagnosis not present

## 2023-11-24 DIAGNOSIS — F419 Anxiety disorder, unspecified: Secondary | ICD-10-CM

## 2023-11-24 DIAGNOSIS — E782 Mixed hyperlipidemia: Secondary | ICD-10-CM | POA: Diagnosis not present

## 2023-11-24 DIAGNOSIS — F331 Major depressive disorder, recurrent, moderate: Secondary | ICD-10-CM

## 2023-11-24 DIAGNOSIS — I10 Essential (primary) hypertension: Secondary | ICD-10-CM

## 2023-11-24 LAB — BAYER DCA HB A1C WAIVED: HB A1C (BAYER DCA - WAIVED): 6 % — ABNORMAL HIGH (ref 4.8–5.6)

## 2023-11-24 MED ORDER — SPIRONOLACTONE 100 MG PO TABS: 100.0000 mg | ORAL_TABLET | Freq: Two times a day (BID) | ORAL | 1 refills | Status: AC

## 2023-11-24 MED ORDER — PROPRANOLOL HCL ER 60 MG PO CP24: 60.0000 mg | ORAL_CAPSULE | Freq: Every day | ORAL | 1 refills | Status: AC

## 2023-11-24 MED ORDER — CLONAZEPAM 0.5 MG PO TABS: ORAL_TABLET | ORAL | 0 refills | Status: DC

## 2023-11-24 MED ORDER — COMBIPATCH 0.05-0.14 MG/DAY TD PTTW: 1.0000 | MEDICATED_PATCH | TRANSDERMAL | 12 refills | Status: AC

## 2023-11-24 MED ORDER — CITALOPRAM HYDROBROMIDE 40 MG PO TABS: 60.0000 mg | ORAL_TABLET | Freq: Every day | ORAL | 1 refills | Status: DC

## 2023-11-24 MED ORDER — MELOXICAM 15 MG PO TABS: 15.0000 mg | ORAL_TABLET | Freq: Every day | ORAL | 3 refills | Status: AC

## 2023-11-24 MED ORDER — BUPROPION HCL ER (XL) 300 MG PO TB24: 300.0000 mg | ORAL_TABLET | Freq: Every day | ORAL | 1 refills | Status: DC

## 2023-11-24 NOTE — Assessment & Plan Note (Signed)
 Under good control on current regimen. Continue current regimen. Continue to monitor. Call with any concerns. Refills given. Labs drawn today.

## 2023-11-24 NOTE — Assessment & Plan Note (Signed)
 Rechecking labs today. Await results. Treat as needed.

## 2023-11-24 NOTE — Progress Notes (Signed)
 BP 135/79   Pulse 76   Temp 97.6 F (36.4 C) (Oral)   Ht 5' 4 (1.626 m)   Wt 218 lb (98.9 kg)   LMP  (LMP Unknown)   BMI 37.42 kg/m    Subjective:    Patient ID: Tracie Sheppard, female    DOB: 1955/09/09, 68 y.o.   MRN: [id number]  HPI: Tracie Sheppard is a 68 y.o. female  Chief Complaint  Patient presents with   Hypertension   Hyperlipidemia   Anxiety   Impaired Fasting Glucose HbA1C:  Lab Results  Component Value Date   HGBA1C 6.1 (H) 04/21/2023   Duration of elevated blood sugar: chronic Polydipsia: no Polyuria: no Weight change: yes Visual disturbance: no Glucose Monitoring: no Diabetic Education: Completed Family history of diabetes: no  HYPERTENSION / HYPERLIPIDEMIA Satisfied with current treatment? yes Duration of hypertension: chronic BP monitoring frequency: not checking BP medication side effects: no Past BP meds: spironalactone, propranolol  Duration of hyperlipidemia: chronic Cholesterol medication side effects: no Cholesterol supplements: none Past cholesterol medications: crestor  Medication compliance: excellent compliance Aspirin: no Recent stressors: no Recurrent headaches: no Visual changes: no Palpitations: no Dyspnea: no Chest pain: no Lower extremity edema: no Dizzy/lightheaded: no  Would like to go back on her combipatch  for hot flashes  ANXIETY/DEPRESSION Duration: chronic Status:stable Anxious mood: yes  Excessive worrying: yes Irritability: no  Sweating: no Nausea: no Palpitations:no Hyperventilation: no Panic attacks: no Agoraphobia: no  Obscessions/compulsions: no Depressed mood: no    11/24/2023    8:51 AM 04/21/2023    9:30 AM 01/03/2023    1:43 PM 10/19/2022   10:35 AM 08/22/2022    2:16 PM  Depression screen PHQ 2/9  Decreased Interest 1 1 1  0 1  Down, Depressed, Hopeless 0 1 1  1   PHQ - 2 Score 1 2 2  0 2  Altered sleeping 3 3 3 3 3   Tired, decreased energy 2 2 3 1 2   Change in appetite 3 3 3 3 3    Feeling bad or failure about yourself  0 1 1 0 0  Trouble concentrating 0 1 0 1 0  Moving slowly or fidgety/restless 0 0 0 0 0  Suicidal thoughts 0 0 0 0 0  PHQ-9 Score 9 12 12 8 10   Difficult doing work/chores Somewhat difficult Somewhat difficult Somewhat difficult  Somewhat difficult   Anhedonia: no Weight changes: no Insomnia: no   Hypersomnia: no Fatigue/loss of energy: yes Feelings of worthlessness: no Feelings of guilt: no Impaired concentration/indecisiveness: no Suicidal ideations: no  Crying spells: no Recent Stressors/Life Changes: no   Relationship problems: no   Family stress: no     Financial stress: no    Job stress: no    Recent death/loss: no  Relevant past medical, surgical, family and social history reviewed and updated as indicated. Interim medical history since our last visit reviewed. Allergies and medications reviewed and updated.  Review of Systems  Constitutional: Negative.   Respiratory: Negative.    Cardiovascular: Negative.   Musculoskeletal:  Positive for arthralgias and back pain. Negative for gait problem, joint swelling, myalgias, neck pain and neck stiffness.  Skin: Negative.   Psychiatric/Behavioral:  Negative for agitation, behavioral problems, confusion, decreased concentration, dysphoric mood, hallucinations, self-injury, sleep disturbance and suicidal ideas. The patient is nervous/anxious. The patient is not hyperactive.     Per HPI unless specifically indicated above     Objective:    BP 135/79   Pulse 76   Temp  97.6 F (36.4 C) (Oral)   Ht 5' 4 (1.626 m)   Wt 218 lb (98.9 kg)   LMP  (LMP Unknown)   BMI 37.42 kg/m   Wt Readings from Last 3 Encounters:  11/24/23 218 lb (98.9 kg)  11/01/23 218 lb (98.9 kg)  10/19/23 218 lb 4.8 oz (99 kg)    Physical Exam Vitals and nursing note reviewed.  Constitutional:      General: She is not in acute distress.    Appearance: Normal appearance. She is obese. She is not  ill-appearing, toxic-appearing or diaphoretic.  HENT:     Head: Normocephalic and atraumatic.     Right Ear: External ear normal.     Left Ear: External ear normal.     Nose: Nose normal.     Mouth/Throat:     Mouth: Mucous membranes are moist.     Pharynx: Oropharynx is clear.  Eyes:     General: No scleral icterus.       Right eye: No discharge.        Left eye: No discharge.     Extraocular Movements: Extraocular movements intact.     Conjunctiva/sclera: Conjunctivae normal.     Pupils: Pupils are equal, round, and reactive to light.  Cardiovascular:     Rate and Rhythm: Normal rate and regular rhythm.     Pulses: Normal pulses.     Heart sounds: Normal heart sounds. No murmur heard.    No friction rub. No gallop.  Pulmonary:     Effort: Pulmonary effort is normal. No respiratory distress.     Breath sounds: Normal breath sounds. No stridor. No wheezing, rhonchi or rales.  Chest:     Chest wall: No tenderness.  Musculoskeletal:        General: Normal range of motion.     Cervical back: Normal range of motion and neck supple.  Skin:    General: Skin is warm and dry.     Capillary Refill: Capillary refill takes less than 2 seconds.     Coloration: Skin is not jaundiced or pale.     Findings: No bruising, erythema, lesion or rash.  Neurological:     General: No focal deficit present.     Mental Status: She is alert and oriented to person, place, and time. Mental status is at baseline.  Psychiatric:        Mood and Affect: Mood normal.        Behavior: Behavior normal.        Thought Content: Thought content normal.        Judgment: Judgment normal.     Results for orders placed or performed in visit on 11/07/23  HM MAMMOGRAPHY   Collection Time: 11/06/23 10:15 AM  Result Value Ref Range   HM Mammogram 0-4 Bi-Rad 0-4 Bi-Rad, Self Reported Normal  HM DEXA SCAN   Collection Time: 11/06/23 12:57 PM  Result Value Ref Range   HM Dexa Scan Low Bone Mass        Assessment & Plan:   Problem List Items Addressed This Visit       Cardiovascular and Mediastinum   HTN (hypertension)   Under good control on current regimen. Continue current regimen. Continue to monitor. Call with any concerns. Refills given. Labs drawn today.       Relevant Medications   propranolol  ER (INDERAL  LA) 60 MG 24 hr capsule   spironolactone  (ALDACTONE ) 100 MG tablet   Other Relevant Orders   CBC  with Differential/Platelet   Comprehensive metabolic panel with GFR   TSH     Endocrine   IFG (impaired fasting glucose) - Primary   Rechecking labs today. Await results. Treat as needed.       Relevant Orders   Bayer DCA Hb A1c Waived   CBC with Differential/Platelet   Comprehensive metabolic panel with GFR     Other   Hyperlipidemia   Under good control on current regimen. Continue current regimen. Continue to monitor. Call with any concerns. Refills given. Labs drawn today.        Relevant Medications   propranolol  ER (INDERAL  LA) 60 MG 24 hr capsule   spironolactone  (ALDACTONE ) 100 MG tablet   Other Relevant Orders   CBC with Differential/Platelet   Comprehensive metabolic panel with GFR   Lipid Panel w/o Chol/HDL Ratio   Depression, major, recurrent, moderate (HCC)   Under good control on current regimen. Continue current regimen. Continue to monitor. Call with any concerns. Refills given. Labs drawn today.       Relevant Medications   buPROPion  (WELLBUTRIN  XL) 300 MG 24 hr tablet   citalopram  (CELEXA ) 40 MG tablet   Other Relevant Orders   CBC with Differential/Platelet   Comprehensive metabolic panel with GFR   Anxiety   Under good control on current regimen. Continue current regimen. Continue to monitor. Call with any concerns. Refills given. Labs drawn today.       Relevant Medications   buPROPion  (WELLBUTRIN  XL) 300 MG 24 hr tablet   citalopram  (CELEXA ) 40 MG tablet   Other Relevant Orders   CBC with Differential/Platelet   Comprehensive  metabolic panel with GFR   Other Visit Diagnoses       Screening for colon cancer       Referral to GI placed today.   Relevant Orders   Ambulatory referral to Gastroenterology        Follow up plan: Return in about 3 months (around 02/24/2024).

## 2023-11-24 NOTE — Assessment & Plan Note (Addendum)
 Under good control on current regimen. Continue current regimen. Continue to monitor. Call with any concerns. Refills given. Labs drawn today.

## 2023-11-25 LAB — TSH: TSH: 1.48 u[IU]/mL (ref 0.450–4.500)

## 2023-11-25 LAB — COMPREHENSIVE METABOLIC PANEL WITH GFR
ALT: 42 IU/L — ABNORMAL HIGH (ref 0–32)
AST: 33 IU/L (ref 0–40)
Albumin: 4.3 g/dL (ref 3.9–4.9)
Alkaline Phosphatase: 81 IU/L (ref 44–121)
BUN/Creatinine Ratio: 15 (ref 12–28)
BUN: 13 mg/dL (ref 8–27)
Bilirubin Total: 0.6 mg/dL (ref 0.0–1.2)
CO2: 23 mmol/L (ref 20–29)
Calcium: 9.4 mg/dL (ref 8.7–10.3)
Chloride: 104 mmol/L (ref 96–106)
Creatinine, Ser: 0.87 mg/dL (ref 0.57–1.00)
Globulin, Total: 2.6 g/dL (ref 1.5–4.5)
Glucose: 94 mg/dL (ref 70–99)
Potassium: 4.2 mmol/L (ref 3.5–5.2)
Sodium: 142 mmol/L (ref 134–144)
Total Protein: 6.9 g/dL (ref 6.0–8.5)
eGFR: 74 mL/min/1.73 (ref >59)

## 2023-11-25 LAB — LIPID PANEL W/O CHOL/HDL RATIO
Cholesterol, Total: 223 mg/dL — ABNORMAL HIGH (ref 100–199)
HDL: 46 mg/dL (ref >39)
LDL Chol Calc (NIH): 152 mg/dL — ABNORMAL HIGH (ref 0–99)
Triglycerides: 139 mg/dL (ref 0–149)
VLDL Cholesterol Cal: 25 mg/dL (ref 5–40)

## 2023-11-25 LAB — CBC WITH DIFFERENTIAL/PLATELET
Basophils Absolute: 0 x10E3/uL (ref 0.0–0.2)
Basos: 1 %
EOS (ABSOLUTE): 0.1 x10E3/uL (ref 0.0–0.4)
Eos: 2 %
Hematocrit: 43.6 % (ref 34.0–46.6)
Hemoglobin: 13.9 g/dL (ref 11.1–15.9)
Immature Grans (Abs): 0 x10E3/uL (ref 0.0–0.1)
Immature Granulocytes: 0 %
Lymphocytes Absolute: 2.8 x10E3/uL (ref 0.7–3.1)
Lymphs: 44 %
MCH: 29.6 pg (ref 26.6–33.0)
MCHC: 31.9 g/dL (ref 31.5–35.7)
MCV: 93 fL (ref 79–97)
Monocytes Absolute: 0.6 x10E3/uL (ref 0.1–0.9)
Monocytes: 9 %
Neutrophils Absolute: 2.9 x10E3/uL (ref 1.4–7.0)
Neutrophils: 44 %
Platelets: 225 x10E3/uL (ref 150–450)
RBC: 4.69 x10E6/uL (ref 3.77–5.28)
RDW: 12.9 % (ref 11.7–15.4)
WBC: 6.5 x10E3/uL (ref 3.4–10.8)

## 2023-11-27 NOTE — Telephone Encounter (Signed)
 Requested medication (s) are due for refill today: Pharmacy comment: Alternative Requested:INS WILL ONLY COVER 1.3 TABS DAILY PLEASE COMPLETE PA.   Requested medication (s) are on the active medication list: yes  Last refill:  11/24/23  Future visit scheduled: yes  Notes to clinic:  Pharmacy comment: Alternative Requested:INS WILL ONLY COVER 1.3 TABS DAILY PLEASE COMPLETE PA.      Requested Prescriptions  Pending Prescriptions Disp Refills   citalopram  (CELEXA ) 40 MG tablet [Pharmacy Med Name: CITALOPRAM  HBR 40 MG TABLET] 135 tablet 1    Sig: TAKE 1.5 TABLETS BY MOUTH DAILY.     Psychiatry:  Antidepressants - SSRI Passed - 11/27/2023  8:48 AM      Passed - Completed PHQ-2 or PHQ-9 in the last 360 days      Passed - Valid encounter within last 6 months    Recent Outpatient Visits           3 days ago IFG (impaired fasting glucose)   [place] [facility] [place], Megan P, DO   1 month ago Lumbar radiculopathy   [place] [place], [place], DO   2 months ago Lumbar radiculopathy   [place] [facility] [place], Megan P, DO   4 months ago Acute pain of left knee   [place] [facility] [place], [place], DO   6 months ago Lumbar radiculopathy    [facility], DO       Future Appointments             In 2 weeks Onesimo Oneil LABOR, MD [facility]

## 2023-11-30 ENCOUNTER — Ambulatory Visit: Payer: Self-pay | Admitting: Family Medicine

## 2023-12-01 NOTE — Telephone Encounter (Signed)
 Please review for refill.

## 2023-12-08 ENCOUNTER — Ambulatory Visit: Admitting: Family Medicine

## 2023-12-14 ENCOUNTER — Encounter: Payer: Self-pay | Admitting: Family Medicine

## 2023-12-14 ENCOUNTER — Other Ambulatory Visit: Payer: Self-pay | Admitting: Orthopedic Surgery

## 2023-12-14 ENCOUNTER — Ambulatory Visit: Admitting: Orthopedic Surgery

## 2023-12-14 ENCOUNTER — Ambulatory Visit (INDEPENDENT_AMBULATORY_CARE_PROVIDER_SITE_OTHER): Admitting: Family Medicine

## 2023-12-14 VITALS — BP 142/93 | HR 64 | Ht 64.0 in | Wt 215.0 lb

## 2023-12-14 DIAGNOSIS — M25562 Pain in left knee: Secondary | ICD-10-CM | POA: Diagnosis not present

## 2023-12-14 DIAGNOSIS — M9908 Segmental and somatic dysfunction of rib cage: Secondary | ICD-10-CM | POA: Diagnosis not present

## 2023-12-14 DIAGNOSIS — M9904 Segmental and somatic dysfunction of sacral region: Secondary | ICD-10-CM | POA: Diagnosis not present

## 2023-12-14 DIAGNOSIS — G8929 Other chronic pain: Secondary | ICD-10-CM | POA: Diagnosis not present

## 2023-12-14 DIAGNOSIS — M9900 Segmental and somatic dysfunction of head region: Secondary | ICD-10-CM | POA: Diagnosis not present

## 2023-12-14 DIAGNOSIS — M9902 Segmental and somatic dysfunction of thoracic region: Secondary | ICD-10-CM | POA: Diagnosis not present

## 2023-12-14 DIAGNOSIS — M9903 Segmental and somatic dysfunction of lumbar region: Secondary | ICD-10-CM | POA: Diagnosis not present

## 2023-12-14 DIAGNOSIS — M9901 Segmental and somatic dysfunction of cervical region: Secondary | ICD-10-CM | POA: Diagnosis not present

## 2023-12-14 DIAGNOSIS — M9905 Segmental and somatic dysfunction of pelvic region: Secondary | ICD-10-CM | POA: Diagnosis not present

## 2023-12-14 DIAGNOSIS — M9909 Segmental and somatic dysfunction of abdomen and other regions: Secondary | ICD-10-CM | POA: Diagnosis not present

## 2023-12-14 DIAGNOSIS — M5416 Radiculopathy, lumbar region: Secondary | ICD-10-CM

## 2023-12-14 NOTE — Progress Notes (Unsigned)
 New Patient Visit  Assessment: Tracie Sheppard is a 68 y.o. female with the following: Left knee pain; s/p fall months ago  Plan: Tracie Sheppard continues to have pain in the left knee.  She has a pain radiating distally.  She notes buckling sensations.  She is unable to flex her knee without discomfort.  On exam, she has positive McMurray's.  She has tenderness to palpation along the medial joint line.  She has pain with hyperflexion.  We are tender medications, home exercises as well as an injection.  She has not had any significant improvement in her symptoms.  I am recommending an MRI.  Will place the order, and plan to see her back once the results are available.  We have also placed a referral for her to be evaluated by neurosurgery, to review the results of an MRI that she had earlier this year.   Follow-up: Return for After MRI.  Subjective:  Chief Complaint  Patient presents with   Left knee pain    Limited improvement following injection    History of Present Illness: Tracie Sheppard is a 68 y.o. female who returns for evaluation of left knee pain.  I have seen her several times for her left knee.  At the last visit, she received a steroid injection.  She noted some improvement in the symptoms for short period of time, 1-2 weeks, but the pain has progressively returned.  She notes difficulty with flexion.  She also has buckling sensations in the left knee.  She is complaining of some radiating pains distally, including numbness and tingling into the great toe.  She did have a lumbar spine MRI completed earlier this year, but has not discussed the findings with a spine specialist.   Review of Systems: No fevers or chills No numbness or tingling No chest pain No shortness of breath No bowel or bladder dysfunction No GI distress No headaches    Objective: LMP  (LMP Unknown)   Physical Exam:  General: Alert and oriented. and No acute distress. Gait: Left sided antalgic  gait.  There is a small amount of swelling of the anterior knee.  There is a fullness in the posterior knee, with some tenderness.  Knee is stable to varus and valgus stress.  Negative Lachman.  Mild tenderness along the medial joint line.  There is crepitus with range of motion testing.  Positive McMurray's.  Pain with hyperflexion.   IMAGING: I personally reviewed images previously obtained in clinic  Left knee with mild degenerative changes.  Calcification in the posterior aspect of the knee which could represent a Baker's cyst.   New Medications:  No orders of the defined types were placed in this encounter.     Oneil DELENA Horde, MD  12/15/2023 10:29 AM

## 2023-12-14 NOTE — Progress Notes (Signed)
 BP (!) 142/93 (BP Location: Left Arm, Patient Position: Sitting, Cuff Size: Large)   Pulse 64   Ht 5' 4 (1.626 m)   Wt 215 lb (97.5 kg)   LMP  (LMP Unknown)   SpO2 97%   BMI 36.90 kg/m    Subjective:    Patient ID: Tracie Sheppard, female    DOB: 1955/05/09, 68 y.o.   MRN: [id number]  HPI: Tracie Sheppard is a 68 y.o. female  Chief Complaint  Patient presents with   Back Pain   Tracie Sheppard presents today for evaluation and possible treatment with OMT for back pain. She is to have an MRI of her back since her pain has continued. She is seeing Dr. Onesimo. Has been feeling not so good. Has been feeling overwhelmed. Lots of deaths in her community and has had a lot of stress. Pain continues in her low back with radiation down her leg. Better with PT and OMT and rest. Worse with stress and certain positions, Pain is aching and sore and shooting in nature. No other concerns or complaints at this time. Continue to monitor.   Relevant past medical, surgical, family and social history reviewed and updated as indicated. Interim medical history since our last visit reviewed. Allergies and medications reviewed and updated.  Review of Systems  Constitutional: Negative.   Respiratory: Negative.    Cardiovascular: Negative.   Musculoskeletal:  Positive for arthralgias, back pain and myalgias. Negative for gait problem, joint swelling, neck pain and neck stiffness.  Skin: Negative.   Neurological: Negative.   Psychiatric/Behavioral: Negative.      Per HPI unless specifically indicated above     Objective:    BP (!) 142/93 (BP Location: Left Arm, Patient Position: Sitting, Cuff Size: Large)   Pulse 64   Ht 5' 4 (1.626 m)   Wt 215 lb (97.5 kg)   LMP  (LMP Unknown)   SpO2 97%   BMI 36.90 kg/m   Wt Readings from Last 3 Encounters:  12/14/23 215 lb (97.5 kg)  11/24/23 218 lb (98.9 kg)  11/01/23 218 lb (98.9 kg)    Physical Exam Vitals and nursing note reviewed.  Constitutional:       General: She is not in acute distress.    Appearance: Normal appearance. She is not ill-appearing.  HENT:     Head: Normocephalic and atraumatic.     Right Ear: External ear normal.     Left Ear: External ear normal.     Nose: Nose normal.     Mouth/Throat:     Mouth: Mucous membranes are moist.     Pharynx: Oropharynx is clear.  Eyes:     Extraocular Movements: Extraocular movements intact.     Conjunctiva/sclera: Conjunctivae normal.     Pupils: Pupils are equal, round, and reactive to light.  Neck:     Vascular: No carotid bruit.  Cardiovascular:     Rate and Rhythm: Normal rate.     Pulses: Normal pulses.  Pulmonary:     Effort: Pulmonary effort is normal. No respiratory distress.  Abdominal:     General: Abdomen is flat. There is no distension.     Palpations: Abdomen is soft. There is no mass.     Tenderness: There is no abdominal tenderness. There is no right CVA tenderness, left CVA tenderness, guarding or rebound.     Hernia: No hernia is present.  Musculoskeletal:     Cervical back: No muscular tenderness.  Lymphadenopathy:  Cervical: No cervical adenopathy.  Skin:    General: Skin is warm and dry.     Capillary Refill: Capillary refill takes less than 2 seconds.     Coloration: Skin is not jaundiced or pale.     Findings: No bruising, erythema, lesion or rash.  Neurological:     General: No focal deficit present.     Mental Status: She is alert. Mental status is at baseline.  Psychiatric:        Mood and Affect: Mood normal.        Behavior: Behavior normal.        Thought Content: Thought content normal.        Judgment: Judgment normal.   Musculoskeletal:  Exam found Decreased ROM, Tissue texture changes, Tenderness to palpation, and Asymmetry of patient's  head, neck, thorax, ribs, lumbar, pelvis, sacrum, and abdomen Osteopathic Structural Exam:   Head: OAESSR, hypertonic suboccipital muscles  Neck: C4ESRR, SCM hypertonic on the R  Thorax:T3-6  SLRR  Ribs: Rib 5-7 locked up on the L  Lumbar: QL hypertonic on the R  Pelvis: Posterior R innominate  Sacrum: R on R torsion  Abdomen: diaphragm hypertonic bilaterally R>L   Results for orders placed or performed in visit on 11/24/23  Bayer DCA Hb A1c Waived   Collection Time: 11/24/23  9:22 AM  Result Value Ref Range   HB A1C (BAYER DCA - WAIVED) 6.0 (H) 4.8 - 5.6 %  CBC with Differential/Platelet   Collection Time: 11/24/23  9:23 AM  Result Value Ref Range   WBC 6.5 3.4 - 10.8 x10E3/uL   RBC 4.69 3.77 - 5.28 x10E6/uL   Hemoglobin 13.9 11.1 - 15.9 g/dL   Hematocrit 56.3 65.9 - 46.6 %   MCV 93 79 - 97 fL   MCH 29.6 26.6 - 33.0 pg   MCHC 31.9 31.5 - 35.7 g/dL   RDW 87.0 88.2 - 84.5 %   Platelets 225 150 - 450 x10E3/uL   Neutrophils 44 Not Estab. %   Lymphs 44 Not Estab. %   Monocytes 9 Not Estab. %   Eos 2 Not Estab. %   Basos 1 Not Estab. %   Neutrophils Absolute 2.9 1.4 - 7.0 x10E3/uL   Lymphocytes Absolute 2.8 0.7 - 3.1 x10E3/uL   Monocytes Absolute 0.6 0.1 - 0.9 x10E3/uL   EOS (ABSOLUTE) 0.1 0.0 - 0.4 x10E3/uL   Basophils Absolute 0.0 0.0 - 0.2 x10E3/uL   Immature Granulocytes 0 Not Estab. %   Immature Grans (Abs) 0.0 0.0 - 0.1 x10E3/uL  Comprehensive metabolic panel with GFR   Collection Time: 11/24/23  9:23 AM  Result Value Ref Range   Glucose 94 70 - 99 mg/dL   BUN 13 8 - 27 mg/dL   Creatinine, Ser 9.12 0.57 - 1.00 mg/dL   eGFR 74 >40 fO/fpw/8.26   BUN/Creatinine Ratio 15 12 - 28   Sodium 142 134 - 144 mmol/L   Potassium 4.2 3.5 - 5.2 mmol/L   Chloride 104 96 - 106 mmol/L   CO2 23 20 - 29 mmol/L   Calcium  9.4 8.7 - 10.3 mg/dL   Total Protein 6.9 6.0 - 8.5 g/dL   Albumin 4.3 3.9 - 4.9 g/dL   Globulin, Total 2.6 1.5 - 4.5 g/dL   Bilirubin Total 0.6 0.0 - 1.2 mg/dL   Alkaline Phosphatase 81 44 - 121 IU/L   AST 33 0 - 40 IU/L   ALT 42 (H) 0 - 32 IU/L  Lipid Panel w/o Chol/HDL  Ratio   Collection Time: 11/24/23  9:23 AM  Result Value Ref Range    Cholesterol, Total 223 (H) 100 - 199 mg/dL   Triglycerides 860 0 - 149 mg/dL   HDL 46 >60 mg/dL   VLDL Cholesterol Cal 25 5 - 40 mg/dL   LDL Chol Calc (NIH) 847 (H) 0 - 99 mg/dL  TSH   Collection Time: 11/24/23  9:23 AM  Result Value Ref Range   TSH 1.480 0.450 - 4.500 uIU/mL      Assessment & Plan:   Problem List Items Addressed This Visit       Nervous and Auditory   Lumbar radiculopathy - Primary   Continue to follow with neurosurgery. [place] does have somatic dysfunction that is contributing to her symptoms. Treated today with good results as below. Call with any concerns. Continue to monitor.       Other Visit Diagnoses       Thoracic segment dysfunction         Somatic dysfunction of sacral region         Somatic dysfunction of pelvis region         Rib cage region somatic dysfunction         Segmental dysfunction of abdomen         Somatic dysfunction of lumbar region         Head region somatic dysfunction         Cervical segment dysfunction          After verbal consent was obtained, patient was treated today with osteopathic manipulative medicine to the regions of the head, neck, thorax, ribs, lumbar, pelvis, sacrum, and abdomen using the techniques of cranial, myofascial release, counterstrain, muscle energy, HVLA, and soft tissue. Areas of compensation relating to her primary pain source also treated. Patient tolerated the procedure well with good objective and good subjective improvement in symptoms. She left the room in good condition. She was advised to stay well hydrated and that she may have some soreness following the procedure. If not improving or worsening, she will call and come in. She will return for reevaluation  on a PRN basis.   Follow up plan: Return if symptoms worsen or fail to improve.

## 2023-12-14 NOTE — Assessment & Plan Note (Signed)
 Continue to follow with neurosurgery. Naileah does have somatic dysfunction that is contributing to her symptoms. Treated today with good results as below. Call with any concerns. Continue to monitor.

## 2023-12-15 ENCOUNTER — Encounter: Payer: Self-pay | Admitting: Orthopedic Surgery

## 2023-12-18 ENCOUNTER — Inpatient Hospital Stay: Admission: RE | Admit: 2023-12-18 | Source: Ambulatory Visit

## 2023-12-21 ENCOUNTER — Other Ambulatory Visit

## 2023-12-25 ENCOUNTER — Ambulatory Visit
Admission: RE | Admit: 2023-12-25 | Discharge: 2023-12-25 | Disposition: A | Discharge status: Home / Self Care | Source: Ambulatory Visit | Attending: Orthopedic Surgery | Admitting: Orthopedic Surgery

## 2023-12-25 ENCOUNTER — Other Ambulatory Visit

## 2023-12-25 DIAGNOSIS — G8929 Other chronic pain: Secondary | ICD-10-CM

## 2023-12-25 DIAGNOSIS — M2242 Chondromalacia patellae, left knee: Secondary | ICD-10-CM | POA: Diagnosis not present

## 2023-12-28 ENCOUNTER — Ambulatory Visit: Admitting: Orthopedic Surgery

## 2023-12-28 DIAGNOSIS — G8929 Other chronic pain: Secondary | ICD-10-CM | POA: Diagnosis not present

## 2023-12-28 DIAGNOSIS — M25562 Pain in left knee: Secondary | ICD-10-CM

## 2023-12-28 NOTE — Progress Notes (Unsigned)
 Tracie Sheppard

## 2023-12-29 ENCOUNTER — Encounter: Payer: Self-pay | Admitting: Orthopedic Surgery

## 2024-01-02 NOTE — Progress Notes (Unsigned)
 Referring Physician:  Vicci Duwaine SQUIBB, DO [address] [place],  [place] [address]  Primary Physician:  Vicci Duwaine SQUIBB, DO  History of Present Illness: 01/02/2024*** Tracie Sheppard has a history of HTN, OSA, thoracic outlet syndrome, hyperlipidemia, depression.   Lumbar pain.   She's been doing OMT and has seen some improvement with this and PT.   She is taking mobic  and flexeril .   Duration: *** Location: *** Quality: *** Severity: ***  Precipitating: aggravated by *** Modifying factors: made better by *** Weakness: none Timing: ***  Tobacco use: Does not smoke.   Bowel/Bladder Dysfunction: none  Conservative measures:  Physical therapy: OMT with Dr. Vicci,  no PT?*** Multimodal medical therapy including regular antiinflammatories: ***  Injections: *** epidural steroid injections  Past Surgery: ***  Tracie Sheppard has ***no symptoms of cervical myelopathy.  The symptoms are causing a significant impact on the patient's life.   Review of Systems:  A 10 point review of systems is negative, except for the pertinent positives and negatives detailed in the HPI.  Past Medical History: Past Medical History:  Diagnosis Date   Allergy    Seasonal   Anxiety    Arthritis    Bilateral carpal tunnel syndrome 05/30/2017   Chronic back pain    Depression, major, recurrent, moderate (HCC)    Diabetes mellitus without complication (HCC)    Prediabetic   High serum testosterone    HTN (hypertension) 09/12/2019   Kidney stone    Sleep apnea    Thyroid  disease    did not show up in lab work but per other symptoms previous doctor started her on this    Past Surgical History: Past Surgical History:  Procedure Laterality Date   CHOLECYSTECTOMY     1997   COLONOSCOPY WITH PROPOFOL  N/A 11/16/2018   Procedure: COLONOSCOPY WITH PROPOFOL ;  Surgeon: Unk Corinn Skiff, MD;  Location: [facility];  Service: Gastroenterology;  Laterality: N/A;   CYSTOSCOPY  09/03/2021    ESOPHAGOGASTRODUODENOSCOPY (EGD) WITH PROPOFOL  N/A 11/16/2018   Procedure: ESOPHAGOGASTRODUODENOSCOPY (EGD) WITH PROPOFOL ;  Surgeon: Unk Corinn Skiff, MD;  Location: [facility];  Service: Gastroenterology;  Laterality: N/A;   NASAL SEPTUM SURGERY     OOPHORECTOMY     TUBAL LIGATION     1989    Allergies: Allergies as of 01/04/2024 - Review Complete 12/29/2023  Allergen Reaction Noted   Aleve  [naproxen  sodium] Other (See Comments) 11/08/2016   Statins Other (See Comments) 07/25/2017    Medications: Outpatient Encounter Medications as of 01/04/2024  Medication Sig   acetaminophen  (TYLENOL ) 500 MG tablet Tylenol  Extra Strength 500 mg tablet   Alpha-Lipoic Acid 600 MG CAPS Take 600 mg by mouth daily.   buPROPion  (WELLBUTRIN  XL) 300 MG 24 hr tablet Take 1 tablet (300 mg total) by mouth daily.   cetirizine  (ZYRTEC ) 10 MG tablet Take 1 tablet (10 mg total) by mouth daily as needed for allergies.   citalopram  (CELEXA ) 40 MG tablet TAKE 1.5 TABLETS BY MOUTH DAILY.   clonazePAM  (KLONOPIN ) 0.5 MG tablet TAKE 1 TABLET BY MOUTH DAILY AS NEEDED FOR ANIXETY   Cyanocobalamin 1000 MCG TBCR Take 1,000 mcg by mouth daily.   cyclobenzaprine  (FLEXERIL ) 10 MG tablet Take 0.5-1 tablets (5-10 mg total) by mouth at bedtime.   estradiol -norethindrone (COMBIPATCH ) 0.05-0.14 MG/DAY Place 1 patch onto the skin 2 (two) times a week.   fluticasone  (FLONASE ) 50 MCG/ACT nasal spray SPRAY 1 SPRAY INTO EACH NOSTRIL EVERY DAY   meloxicam  (MOBIC )  15 MG tablet Take 1 tablet (15 mg total) by mouth daily.   ondansetron  (ZOFRAN -ODT) 4 MG disintegrating tablet Take 1 tablet (4 mg total) by mouth every 8 (eight) hours as needed for nausea or vomiting.   propranolol  ER (INDERAL  LA) 60 MG 24 hr capsule Take 1 capsule (60 mg total) by mouth daily.   rosuvastatin  (CRESTOR ) 20 MG tablet Take 1 tablet (20 mg total) by mouth once a week.   spironolactone  (ALDACTONE ) 100 MG tablet Take 1 tablet (100 mg total) by mouth 2 (two)  times daily.   VITAMIN D  PO Take by mouth daily. 1000 mcg   No facility-administered encounter medications on file as of 01/04/2024.    Social History: Social History   Tobacco Use   Smoking status: Never   Smokeless tobacco: Never  Vaping Use   Vaping status: Never Used  Substance Use Topics   Alcohol use: Yes    Comment: On occasion   Drug use: No    Family Medical History: Family History  Problem Relation Age of Onset   Diabetes Mother    Anemia Mother    Hyperlipidemia Mother    Arthritis Mother    Hypertension Father    Cancer Father    Celiac disease Sister    Diabetes Maternal Grandmother    Stroke Maternal Grandmother    Heart attack Maternal Grandmother    Heart disease Paternal Grandfather     Physical Examination: There were no vitals filed for this visit.  General: Patient is well developed, well nourished, calm, collected, and in no apparent distress. Attention to examination is appropriate.  Respiratory: Patient is breathing without any difficulty.   NEUROLOGICAL:     Awake, alert, oriented to person, place, and time.  Speech is clear and fluent. Fund of knowledge is appropriate.   Cranial Nerves: Pupils equal round and reactive to light.  Facial tone is symmetric.    *** ROM of cervical spine *** pain *** posterior cervical tenderness. *** tenderness in bilateral trapezial region.   *** ROM of lumbar spine *** pain *** posterior lumbar tenderness.   No abnormal lesions on exposed skin.   Strength: Side Biceps Triceps Deltoid Interossei Grip Wrist Ext. Wrist Flex.  R 5 5 5 5 5 5 5   L 5 5 5 5 5 5 5    Side Iliopsoas Quads Hamstring PF DF EHL  R 5 5 5 5 5 5   L 5 5 5 5 5 5    Reflexes are ***2+ and symmetric at the biceps, brachioradialis, patella and achilles.   Hoffman's is absent.  Clonus is not present.   Bilateral upper and lower extremity sensation is intact to light touch.     Gait is normal.   ***No difficulty with tandem gait.     Medical Decision Making  Imaging: Lumbar MRI dated 04/20/23:  FINDINGS: Segmentation: A transitional anatomy with partial sacralization of L5.   Alignment:  A 6 mm anterolisthesis of L4 over L5.   Vertebrae: An ill-defined T1 and T2 hypointense area within the left side of the L2 vertebral body, mildly hyperintense on the STIR sequence. No evidence of discitis or fracture.   Conus medullaris and cauda equina: Conus extends to the T12 level. Conus and cauda equina appear normal.   Paraspinal and other soft tissues: A 13 mm uterine fibroid.   Disc levels:   T12-L1: No spinal canal or neural foraminal stenosis.   L1-2: No spinal canal or neural foraminal stenosis.   L2-3:  Shallow disc bulge and mild facet degenerative changes resulting in mild bilateral neural foraminal narrowing. No significant spinal canal stenosis.   L3-4: Shallow disc bulge and moderate facet degenerative changes resulting in mild bilateral neural foraminal narrowing. No significant spinal canal stenosis.   L4-5: Anterolisthesis, disc bulge/disc uncovering, advanced facet degenerative changes and ligamentum flavum redundancy. Findings result in mild narrowing of the bilateral subarticular zones and moderate bilateral neural foraminal narrowing.   L5-S1: Mild right and moderate left facet degenerative changes. No spinal canal or neural foraminal stenosis.   IMPRESSION: 1. Degenerative changes of the lumbar spine, more pronounced at the level of the facet joints at L4-5 where there is anterolisthesis, mild narrowing of the bilateral subarticular zones and moderate bilateral neural foraminal narrowing. 2. An ill-defined T1 and T2 hypointense area within the left side of the L2 vertebral body, mildly hyperintense on the STIR sequence. This is nonspecific and may represent a lipid poor hemangioma. In the absence of known history of malignancy, a follow-up MRI in 6 months without and with contrast is  suggested.     Electronically Signed   By: Katyucia  de Macedo Rodrigues M.D.   On: 04/28/2023 10:18  I have personally reviewed the images and agree with the above interpretation.  Assessment and Plan: Tracie Sheppard is a pleasant 68 y.o. female has ***  Treatment options discussed with patient and following plan made:   - Order for physical therapy for *** spine ***. Patient to call to schedule appointment. *** - Continue current medications including ***. Reviewed dosing and side effects.  - Prescription for ***. Reviewed dosing and side effects. Take with food.  - Prescription for *** to take prn muscle spasms. Reviewed dosing and side effects. Discussed this can cause drowsiness.  - MRI of *** to further evaluate *** radiculopathy. No improvement time or medications (***).  - Referral to PM[facility] at [facility] to discuss possible *** injections.  - Will schedule phone visit to review MRI results once I get them back.   I spent a total of *** minutes in face-to-face and non-face-to-face activities related to this patient's care today including review of outside records, review of imaging, review of symptoms, physical exam, discussion of differential diagnosis, discussion of treatment options, and documentation.   Thank you for involving me in the care of this patient.   Glade Boys PA-C Dept. of Neurosurgery

## 2024-01-04 ENCOUNTER — Encounter: Payer: Self-pay | Admitting: Orthopedic Surgery

## 2024-01-04 ENCOUNTER — Ambulatory Visit: Admitting: Orthopedic Surgery

## 2024-01-04 VITALS — BP 154/90 | Ht 64.0 in | Wt 217.1 lb

## 2024-01-04 DIAGNOSIS — M47816 Spondylosis without myelopathy or radiculopathy, lumbar region: Secondary | ICD-10-CM

## 2024-01-04 DIAGNOSIS — M48062 Spinal stenosis, lumbar region with neurogenic claudication: Secondary | ICD-10-CM | POA: Diagnosis not present

## 2024-01-04 DIAGNOSIS — M4726 Other spondylosis with radiculopathy, lumbar region: Secondary | ICD-10-CM

## 2024-01-04 DIAGNOSIS — M5416 Radiculopathy, lumbar region: Secondary | ICD-10-CM

## 2024-01-04 NOTE — Patient Instructions (Addendum)
 It was so nice to see you today. Thank you so much for coming in.    You have some wear and tear in your back and this is likely causing your back and left leg pain.   Previous MRI showed area in bone at L2- it is likely nothing but radiology wanted to get a better picture to be sure.  I want to get an MRI of your lower back with and without contrast to look into things further. We will get this approved through your insurance and [facility] will call you to schedule the appointment. Ask about your patient responsibility. You do not need to pay this prior to getting MRI, they can bill you.    [facility]patient [facility] (building with the white pillars) is located off of [address] [place]. The address is [address] [place], [place] [address].    After you have the MRI, it can take 14-28 days for me to get the results back. If I don't have them in 2 weeks, we will call to try to get the results.   Once I have the results, we will call you to schedule a follow up visit with me to review them. I will also send you a message in MyChart.   Depending on results, we may consider injections and/or PT for your back.   Please do not hesitate to call if you have any questions or concerns. You can also message me in MyChart.   Your blood pressure was elevated today. I want you to recheck it at home and follow up with your PCP if it remains high. If you have any chest pain, shortness of breath, blurry vision, or headaches then you need to go to ED.    Glade Boys PA-C [phone]     The physicians and staff at [facility] at [facility] are committed to providing excellent care. You may receive a survey asking for feedback about your experience at our office. We value you your feedback and appreciate you taking the time to to fill it out. The [facility] leadership team is also available to discuss your experience in person, feel free to contact us  [phone].

## 2024-01-08 ENCOUNTER — Ambulatory Visit: Admitting: Orthopedic Surgery

## 2024-01-08 ENCOUNTER — Ambulatory Visit
Admission: RE | Admit: 2024-01-08 | Discharge: 2024-01-08 | Disposition: A | Discharge status: Home / Self Care | Source: Ambulatory Visit | Attending: Orthopedic Surgery | Admitting: Orthopedic Surgery

## 2024-01-08 DIAGNOSIS — M48062 Spinal stenosis, lumbar region with neurogenic claudication: Secondary | ICD-10-CM | POA: Diagnosis not present

## 2024-01-08 DIAGNOSIS — M4316 Spondylolisthesis, lumbar region: Secondary | ICD-10-CM | POA: Diagnosis not present

## 2024-01-08 DIAGNOSIS — M47816 Spondylosis without myelopathy or radiculopathy, lumbar region: Secondary | ICD-10-CM | POA: Insufficient documentation

## 2024-01-08 DIAGNOSIS — M5416 Radiculopathy, lumbar region: Secondary | ICD-10-CM | POA: Insufficient documentation

## 2024-01-08 DIAGNOSIS — M5136 Other intervertebral disc degeneration, lumbar region with discogenic back pain only: Secondary | ICD-10-CM | POA: Diagnosis not present

## 2024-01-08 DIAGNOSIS — M48061 Spinal stenosis, lumbar region without neurogenic claudication: Secondary | ICD-10-CM | POA: Diagnosis not present

## 2024-01-08 MED ORDER — GADOBUTROL 1 MMOL/ML IV SOLN
10.0000 mL | Freq: Once | INTRAVENOUS | Status: AC | PRN
  Administered 2024-01-08: 10 mL via INTRAVENOUS

## 2024-01-09 ENCOUNTER — Ambulatory Visit (INDEPENDENT_AMBULATORY_CARE_PROVIDER_SITE_OTHER)

## 2024-01-09 DIAGNOSIS — Z23 Encounter for immunization: Secondary | ICD-10-CM | POA: Diagnosis not present

## 2024-01-09 NOTE — Progress Notes (Signed)
 Patient is in office today for a nurse visit for Flu vaccine. Patient Injection was given in the  Left deltoid. Patient tolerated injection well.

## 2024-01-11 DIAGNOSIS — E119 Type 2 diabetes mellitus without complications: Secondary | ICD-10-CM | POA: Diagnosis not present

## 2024-01-11 DIAGNOSIS — H5213 Myopia, bilateral: Secondary | ICD-10-CM | POA: Diagnosis not present

## 2024-01-11 DIAGNOSIS — H2513 Age-related nuclear cataract, bilateral: Secondary | ICD-10-CM | POA: Diagnosis not present

## 2024-01-11 DIAGNOSIS — H43393 Other vitreous opacities, bilateral: Secondary | ICD-10-CM | POA: Diagnosis not present

## 2024-01-11 LAB — OPHTHALMOLOGY REPORT-SCANNED

## 2024-01-12 ENCOUNTER — Encounter: Payer: Self-pay | Admitting: Medical Oncology

## 2024-01-12 ENCOUNTER — Telehealth: Payer: Self-pay | Admitting: Orthopedic Surgery

## 2024-01-12 ENCOUNTER — Ambulatory Visit: Admitting: Family Medicine

## 2024-01-12 DIAGNOSIS — M48062 Spinal stenosis, lumbar region with neurogenic claudication: Secondary | ICD-10-CM

## 2024-01-12 DIAGNOSIS — M5416 Radiculopathy, lumbar region: Secondary | ICD-10-CM

## 2024-01-12 DIAGNOSIS — M899 Disorder of bone, unspecified: Secondary | ICD-10-CM

## 2024-01-12 DIAGNOSIS — M47816 Spondylosis without myelopathy or radiculopathy, lumbar region: Secondary | ICD-10-CM

## 2024-01-12 NOTE — Telephone Encounter (Signed)
 Lumbar MRI dated 01/08/24:  FINDINGS:   BONES AND ALIGNMENT: Conventional lumbosacral anatomy is assumed with 5 non-rib-bearing, lumbar-type vertebral bodies. Unchanged 6 mm anterolisthesis of L4 on L5. Unchanged T1 hypointensity with associated mild STIR hyperintensity within the left aspect of the L2 vertebral body. No definite abnormal enhancement. Heterogeneous enhancement of the S1 through S4 sacral segments with associated T2 hyperintensity on STIR images suspicious for neoplasm. Normal vertebral body heights (except for the L4-L5 anterolisthesis). Bone marrow signal is unremarkable (except for L2 and sacral findings).   SPINAL CORD: The conus terminates at T12-L1.   SOFT TISSUES: No paraspinal mass. 14 mm fibroid in the posterior uterine body.   L1-L2: No significant disc herniation. No spinal canal stenosis or neural foraminal narrowing.   L2-L3: Small disc bulge and mild bilateral facet arthropathy without spinal canal stenosis or significant neural foraminal narrowing.   L3-L4: Small disc bulge and mild bilateral facet arthropathy without spinal canal stenosis or neural foraminal narrowing.   L4-L5: Anterolisthesis and moderate bilateral facet arthropathy contribute to moderate bilateral neural foraminal narrowing, unchanged.   L5-S1: No significant disc herniation. Mild bilateral facet arthropathy. No spinal canal stenosis or neural foraminal narrowing.   IMPRESSION: 1. Heterogeneous enhancement of the S1S4 sacral segments with associated STIR hyperintensity, suspicious for neoplasm. 2. Unchanged T1 hypointensity with mild STIR hyperintensity in the left L2 vertebral body without definite abnormal enhancement. 3. Unchanged 6 mm anterolisthesis of L4 on L5 with moderate bilateral neural foraminal narrowing at this level.   Electronically signed by: Ryan Chess MD 01/12/2024 12:23 PM EDT RP Workstation: [id number]

## 2024-01-12 NOTE — Telephone Encounter (Signed)
 Spoke with patient regarding MRI results, particularly enhancement of the S1 through S4 sacral segments with associated T2 hyperintensity on STIR images suspicious for neoplasm.   Reviewed with Dr. Buckley and he recommends referral to medical oncology for workup and any staging. This was placed as urgent referral.

## 2024-01-15 ENCOUNTER — Encounter: Payer: Self-pay | Admitting: Medical Oncology

## 2024-01-15 ENCOUNTER — Inpatient Hospital Stay: Attending: Physician Assistant | Admitting: Physician Assistant

## 2024-01-15 ENCOUNTER — Inpatient Hospital Stay

## 2024-01-15 VITALS — BP 159/82 | HR 78 | Temp 98.5°F | Resp 14 | Wt 215.5 lb

## 2024-01-15 DIAGNOSIS — M545 Low back pain, unspecified: Secondary | ICD-10-CM | POA: Diagnosis not present

## 2024-01-15 DIAGNOSIS — R7989 Other specified abnormal findings of blood chemistry: Secondary | ICD-10-CM | POA: Diagnosis not present

## 2024-01-15 DIAGNOSIS — R9389 Abnormal findings on diagnostic imaging of other specified body structures: Secondary | ICD-10-CM

## 2024-01-15 DIAGNOSIS — G8929 Other chronic pain: Secondary | ICD-10-CM | POA: Insufficient documentation

## 2024-01-15 DIAGNOSIS — Z808 Family history of malignant neoplasm of other organs or systems: Secondary | ICD-10-CM | POA: Insufficient documentation

## 2024-01-15 DIAGNOSIS — Z8042 Family history of malignant neoplasm of prostate: Secondary | ICD-10-CM | POA: Insufficient documentation

## 2024-01-15 DIAGNOSIS — Z807 Family history of other malignant neoplasms of lymphoid, hematopoietic and related tissues: Secondary | ICD-10-CM | POA: Insufficient documentation

## 2024-01-15 LAB — CMP (CANCER CENTER ONLY)
ALT: 54 U/L — ABNORMAL HIGH (ref 0–44)
AST: 39 U/L (ref 15–41)
Albumin: 4.5 g/dL (ref 3.5–5.0)
Alkaline Phosphatase: 82 U/L (ref 38–126)
Anion gap: 4 — ABNORMAL LOW (ref 5–15)
BUN: 16 mg/dL (ref 8–23)
CO2: 31 mmol/L (ref 22–32)
Calcium: 10.1 mg/dL (ref 8.9–10.3)
Chloride: 104 mmol/L (ref 98–111)
Creatinine: 0.97 mg/dL (ref 0.44–1.00)
GFR, Estimated: >60 mL/min (ref >60)
Glucose, Bld: 99 mg/dL (ref 70–99)
Potassium: 4.1 mmol/L (ref 3.5–5.1)
Sodium: 139 mmol/L (ref 135–145)
Total Bilirubin: 1.3 mg/dL — ABNORMAL HIGH (ref 0.0–1.2)
Total Protein: 7.5 g/dL (ref 6.5–8.1)

## 2024-01-15 LAB — CBC WITH DIFFERENTIAL (CANCER CENTER ONLY)
Abs Immature Granulocytes: 0.01 K/uL (ref 0.00–0.07)
Basophils Absolute: 0 K/uL (ref 0.0–0.1)
Basophils Relative: 0 %
Eosinophils Absolute: 0.1 K/uL (ref 0.0–0.5)
Eosinophils Relative: 1 %
HCT: 42.5 % (ref 36.0–46.0)
Hemoglobin: 14.1 g/dL (ref 12.0–15.0)
Immature Granulocytes: 0 %
Lymphocytes Relative: 44 %
Lymphs Abs: 3.4 K/uL (ref 0.7–4.0)
MCH: 29.7 pg (ref 26.0–34.0)
MCHC: 33.2 g/dL (ref 30.0–36.0)
MCV: 89.5 fL (ref 80.0–100.0)
Monocytes Absolute: 0.7 K/uL (ref 0.1–1.0)
Monocytes Relative: 9 %
Neutro Abs: 3.6 K/uL (ref 1.7–7.7)
Neutrophils Relative %: 46 %
Platelet Count: 264 K/uL (ref 150–400)
RBC: 4.75 MIL/uL (ref 3.87–5.11)
RDW: 12.7 % (ref 11.5–15.5)
WBC Count: 7.9 K/uL (ref 4.0–10.5)
nRBC: 0 % (ref 0.0–0.2)

## 2024-01-15 LAB — RETICULOCYTES
Immature Retic Fract: 7.1 % (ref 2.3–15.9)
RBC.: 4.86 MIL/uL (ref 3.87–5.11)
Retic Count, Absolute: 56.9 K/uL (ref 19.0–186.0)
Retic Ct Pct: 1.2 % (ref 0.4–3.1)

## 2024-01-15 LAB — IRON AND IRON BINDING CAPACITY (CC-WL,HP ONLY)
Iron: 125 ug/dL (ref 28–170)
Saturation Ratios: 39 % — ABNORMAL HIGH (ref 10.4–31.8)
TIBC: 323 ug/dL (ref 250–450)
UIBC: 198 ug/dL (ref 148–442)

## 2024-01-15 LAB — SEDIMENTATION RATE: Sed Rate: 8 mm/h (ref 0–22)

## 2024-01-15 LAB — C-REACTIVE PROTEIN: CRP: 0.6 mg/dL (ref <1.0)

## 2024-01-15 LAB — FERRITIN: Ferritin: 222 ng/mL (ref 11–307)

## 2024-01-15 NOTE — Patient Instructions (Addendum)
 Diagnostic Clinic Office Visit Discharge Information and Instructions  Thank you for choosing [facility] for your healthcare needs.  Below is a summary of today's discussion, along with our contact information and an outline of what to expect next.  Reason for Visit:  abnormal MRI  Proposed Diagnostic Care Plan: Labs collected today. Nuclear medicine bone scan ordered. Please call our Central Scheduling department to schedule this at [phone].     What to Expect: - Generally, when lab tests are ordered the results can take up to 1 week for results to be available.  At that point, we will contact you to discuss your results with you.  Unless there is a critical result, we will typically wait for all of your lab results to be available before contacting you. - If a biopsy is part of your Care Plan, those results can take on average 7-10 days to result.  Once results are available, we will contact you to discuss your pathology results and any next steps. - If you have additional imaging ordered, such as a CT Scan, MRI, Ultrasound, Bone Scan, or PET scan, your imaging will need to be authorized then scheduled with the earliest available appointment.  You may be asked to travel to another hospital within [facility] who has a sooner availability, please consider doing so if asked. - If you use MyChart, your results will be available to you in the MyChart portal.  Your provider will be in touch with you as soon as all of your results are available to be discussed.  Your Diagnostic Clinic Provider:  Sharah Finnell Walisiewicz PA-C and Dr. Federico Your Diagnostic Navigator:  Colene Raider RN, office number [phone]  If you or your caregiver have number blocking on your cell phones, please ensure the cancer center's numbers are not blocked.  If you are not a registered MyChart user, please consider enrolling in MyChart to receive your test results and visit notes.  You can also access your  discharge instructions electronically.  MyChart also gives you an electronic means to communicate with your Care Team instead of needing to call in to the cancer center.  We appreciate you trusting us  with your healthcare and look forward to partnering with you as we work to uncover what your potential diagnosis may be.  Please do not hesitate to reach out at any point with questions or concerns.
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
## 2024-01-15 NOTE — Progress Notes (Signed)
 [facility]  Patient presented to clinic, with spouse, for her scheduled appointment with PA-C Mallie Combes. I introduced myself and provided them with my direct contact information. Patient and spouse were both encouraged to call me with any questions/concerns they may have.  Tracie KYM Raider, RN, BSN, [facility] Oncology Nurse Navigator, [facility] 01/15/2024 1:24 PM

## 2024-01-16 ENCOUNTER — Encounter: Payer: Self-pay | Admitting: Physician Assistant

## 2024-01-16 LAB — KAPPA/LAMBDA LIGHT CHAINS
Kappa free light chain: 20.7 mg/L — ABNORMAL HIGH (ref 3.3–19.4)
Kappa, lambda light chain ratio: 1.33 (ref 0.26–1.65)
Lambda free light chains: 15.6 mg/L (ref 5.7–26.3)

## 2024-01-17 ENCOUNTER — Telehealth: Payer: Self-pay | Admitting: Physician Assistant

## 2024-01-17 ENCOUNTER — Ambulatory Visit (HOSPITAL_COMMUNITY)
Admission: RE | Admit: 2024-01-17 | Discharge: 2024-01-17 | Disposition: A | Discharge status: Home / Self Care | Source: Ambulatory Visit | Attending: Physician Assistant | Admitting: Physician Assistant

## 2024-01-17 DIAGNOSIS — R9389 Abnormal findings on diagnostic imaging of other specified body structures: Secondary | ICD-10-CM | POA: Insufficient documentation

## 2024-01-17 DIAGNOSIS — M549 Dorsalgia, unspecified: Secondary | ICD-10-CM | POA: Diagnosis not present

## 2024-01-17 MED ORDER — TECHNETIUM TC 99M MEDRONATE IV KIT
20.0000 | PACK | Freq: Once | INTRAVENOUS | Status: AC | PRN
  Administered 2024-01-17: 18.7 via INTRAVENOUS

## 2024-01-17 NOTE — Telephone Encounter (Signed)
 I notified Tracie Sheppard by phone regarding RDS work up results after discussing with Dr. Federico. Labs are not consistent with multiple myeloma or an iron deficiency anemia. Nuclear medicine bone scan is not suggestive of a neoplastic process. Will order pelvic MRI to further evaluate abnormalities seen on MRI of lumbar spine in S1-S4. [name] were answered and she expressed understanding of the plan provided.

## 2024-01-18 ENCOUNTER — Encounter: Payer: Self-pay | Admitting: Family Medicine

## 2024-01-18 ENCOUNTER — Ambulatory Visit
Admission: RE | Admit: 2024-01-18 | Discharge: 2024-01-18 | Disposition: A | Discharge status: Home / Self Care | Source: Ambulatory Visit | Attending: Physician Assistant | Admitting: Physician Assistant

## 2024-01-18 DIAGNOSIS — D259 Leiomyoma of uterus, unspecified: Secondary | ICD-10-CM | POA: Diagnosis not present

## 2024-01-18 DIAGNOSIS — M7602 Gluteal tendinitis, left hip: Secondary | ICD-10-CM | POA: Diagnosis not present

## 2024-01-18 DIAGNOSIS — R9389 Abnormal findings on diagnostic imaging of other specified body structures: Secondary | ICD-10-CM | POA: Insufficient documentation

## 2024-01-18 MED ORDER — GADOBUTROL 1 MMOL/ML IV SOLN
10.0000 mL | Freq: Once | INTRAVENOUS | Status: AC | PRN
  Administered 2024-01-18: 10 mL via INTRAVENOUS

## 2024-01-19 ENCOUNTER — Telehealth: Payer: Self-pay | Admitting: Physician Assistant

## 2024-01-19 LAB — MULTIPLE MYELOMA PANEL, SERUM
Albumin SerPl Elph-Mcnc: 3.8 g/dL (ref 2.9–4.4)
Albumin/Glob SerPl: 1.3 (ref 0.7–1.7)
Alpha 1: 0.2 g/dL (ref 0.0–0.4)
Alpha2 Glob SerPl Elph-Mcnc: 0.6 g/dL (ref 0.4–1.0)
B-Globulin SerPl Elph-Mcnc: 1.1 g/dL (ref 0.7–1.3)
Gamma Glob SerPl Elph-Mcnc: 1.2 g/dL (ref 0.4–1.8)
Globulin, Total: 3.1 g/dL (ref 2.2–3.9)
IgA: 197 mg/dL (ref 87–352)
IgG (Immunoglobin G), Serum: 1362 mg/dL (ref 586–1602)
IgM (Immunoglobulin M), Srm: 60 mg/dL (ref 26–217)
Total Protein ELP: 6.9 g/dL (ref 6.0–8.5)

## 2024-01-19 NOTE — Telephone Encounter (Signed)
 I notified Tracie Sheppard by phone regarding MRI pelvis results after discussing with Dr. Federico. Imaging does not suspicious osseous lesions Patent informed of small uterine fibroids seen. Patient's work up is overall negative for malignancy and no further oncology follow up is needed. [name] were answered and she expressed understanding of the plan provided. Referring provider will be notified of our workup by our nurse navigator [place].

## 2024-01-22 ENCOUNTER — Encounter: Payer: Self-pay | Admitting: Family Medicine

## 2024-01-24 ENCOUNTER — Ambulatory Visit: Admitting: Family Medicine

## 2024-01-24 VITALS — BP 149/86 | HR 71 | Temp 98.0°F | Ht 64.0 in

## 2024-01-24 DIAGNOSIS — M9905 Segmental and somatic dysfunction of pelvic region: Secondary | ICD-10-CM | POA: Diagnosis not present

## 2024-01-24 DIAGNOSIS — M5416 Radiculopathy, lumbar region: Secondary | ICD-10-CM | POA: Diagnosis not present

## 2024-01-24 DIAGNOSIS — M9902 Segmental and somatic dysfunction of thoracic region: Secondary | ICD-10-CM | POA: Diagnosis not present

## 2024-01-24 DIAGNOSIS — M9906 Segmental and somatic dysfunction of lower extremity: Secondary | ICD-10-CM

## 2024-01-24 DIAGNOSIS — D259 Leiomyoma of uterus, unspecified: Secondary | ICD-10-CM | POA: Insufficient documentation

## 2024-01-24 DIAGNOSIS — M9909 Segmental and somatic dysfunction of abdomen and other regions: Secondary | ICD-10-CM

## 2024-01-24 DIAGNOSIS — M9908 Segmental and somatic dysfunction of rib cage: Secondary | ICD-10-CM

## 2024-01-24 DIAGNOSIS — M9904 Segmental and somatic dysfunction of sacral region: Secondary | ICD-10-CM

## 2024-01-24 DIAGNOSIS — M9903 Segmental and somatic dysfunction of lumbar region: Secondary | ICD-10-CM | POA: Diagnosis not present

## 2024-01-24 NOTE — Assessment & Plan Note (Signed)
 Due to see GYN in December. Continue to monitor. Call with any concerns.

## 2024-01-24 NOTE — Assessment & Plan Note (Signed)
She does have somatic dysfunction that is contributing to her symptoms. Treated today with good results as below. Call with any concerns.  

## 2024-01-24 NOTE — Progress Notes (Signed)
 BP (!) 149/86   Pulse 71   Temp 98 F (36.7 C) (Oral)   Ht 5' 4 (1.626 m)   LMP  (LMP Unknown)   SpO2 98%   BMI 36.99 kg/m    Subjective:    Patient ID: Tracie Sheppard, female    DOB: [birth year], 68 y.o.   MRN: [id number]  HPI: Tracie Sheppard is a 68 y.o. female  Chief Complaint  Patient presents with   Back Pain   Meara presents today for evaluation and possible treatment with OMT for low back pain. Since her last visit, she had seen neurosurgery and had an MRI of her back which showed a suspicious spot on her lumbar spine- she saw oncology and had a normal work up including a bone scan. On her MRI of her pelvis, they found that she had uterine fibroids. She notes that she does occasionally have uterine cramping. No bleeding. She notes that she has called and made an appointment to see gyn in December.   Zerina notes that her back is still acting up. Not feeling much better. Pain is aching and sore and in her mid-back. It does tend to radiate down her legs. Better with OMT and anti-inflammatories. Pain is worse with stress and activity. She is otherwise doing well with no other concerns or complaints at this time.   Relevant past medical, surgical, family and social history reviewed and updated as indicated. Interim medical history since our last visit reviewed. Allergies and medications reviewed and updated.  Review of Systems  Constitutional: Negative.   Respiratory: Negative.    Cardiovascular: Negative.   Musculoskeletal:  Positive for back pain and myalgias. Negative for arthralgias, gait problem, joint swelling, neck pain and neck stiffness.  Skin: Negative.   Neurological: Negative.   Psychiatric/Behavioral: Negative.      Per HPI unless specifically indicated above     Objective:    BP (!) 149/86   Pulse 71   Temp 98 F (36.7 C) (Oral)   Ht 5' 4 (1.626 m)   LMP  (LMP Unknown)   SpO2 98%   BMI 36.99 kg/m   Wt Readings from Last 3 Encounters:  01/15/24  215 lb 8 oz (97.8 kg)  01/04/24 217 lb 2 oz (98.5 kg)  12/14/23 215 lb (97.5 kg)    Physical Exam Vitals and nursing note reviewed.  Constitutional:      General: She is not in acute distress.    Appearance: Normal appearance. She is obese. She is not ill-appearing.  HENT:     Head: Normocephalic and atraumatic.     Right Ear: External ear normal.     Left Ear: External ear normal.     Nose: Nose normal.     Mouth/Throat:     Mouth: Mucous membranes are moist.     Pharynx: Oropharynx is clear.  Eyes:     Extraocular Movements: Extraocular movements intact.     Conjunctiva/sclera: Conjunctivae normal.     Pupils: Pupils are equal, round, and reactive to light.  Neck:     Vascular: No carotid bruit.  Cardiovascular:     Rate and Rhythm: Normal rate.     Pulses: Normal pulses.  Pulmonary:     Effort: Pulmonary effort is normal. No respiratory distress.  Abdominal:     General: Abdomen is flat. There is no distension.     Palpations: Abdomen is soft. There is no mass.     Tenderness: There is no abdominal  tenderness. There is no right CVA tenderness, left CVA tenderness, guarding or rebound.     Hernia: No hernia is present.  Musculoskeletal:     Cervical back: No muscular tenderness.  Lymphadenopathy:     Cervical: No cervical adenopathy.  Skin:    General: Skin is warm and dry.     Capillary Refill: Capillary refill takes less than 2 seconds.     Coloration: Skin is not jaundiced or pale.     Findings: No bruising, erythema, lesion or rash.  Neurological:     General: No focal deficit present.     Mental Status: She is alert. Mental status is at baseline.  Psychiatric:        Mood and Affect: Mood normal.        Behavior: Behavior normal.        Thought Content: Thought content normal.        Judgment: Judgment normal.   Musculoskeletal:  Exam found Decreased ROM, Tissue texture changes, Tenderness to palpation, and Asymmetry of patient's  thorax, ribs, lumbar,  pelvis, sacrum, lower extremity, and abdomen Osteopathic Structural Exam:   Thorax: T3-5 SLRR  Ribs: Ribs 6-9 locked down on the L, Rib 7 locked up on the R  Lumbar: QL hypertonic on the R, L3-5SLRR  Pelvis: Posterior R innominate  Sacrum: R on R torsion  Lower Extremities: IT band hypertonic on the R  Abdomen:  diaphragm hypertonic R>L   Results for orders placed or performed in visit on 01/18/24  OPHTHALMOLOGY REPORT-SCANNED   Collection Time: 01/11/24 12:38 PM  Result Value Ref Range   HM Diabetic Eye Exam No Retinopathy No Retinopathy   A Comment        Assessment & Plan:   Problem List Items Addressed This Visit       Nervous and Auditory   Lumbar radiculopathy - Primary   She does have somatic dysfunction that is contributing to her symptoms. Treated today with good results as below. Call with any concerns.         Genitourinary   Uterine leiomyoma   Due to see GYN in December. Continue to monitor. Call with any concerns.       Other Visit Diagnoses       Thoracic segment dysfunction         Somatic dysfunction of sacral region         Somatic dysfunction of pelvis region         Rib cage region somatic dysfunction         Segmental dysfunction of abdomen         Somatic dysfunction of lumbar region         Somatic dysfunction of lower extremities          After verbal consent was obtained, patient was treated today with osteopathic manipulative medicine to the regions of the thorax, ribs, lumbar, pelvis, sacrum, abdomen, and lower extremity using the techniques of myofascial release, counterstrain, muscle energy, HVLA, and soft tissue. Areas of compensation relating to her primary pain source also treated. Patient tolerated the procedure well with good objective and good subjective improvement in symptoms. She left the room in good condition. She was advised to stay well hydrated and that she may have some soreness following the procedure. If not improving or  worsening, she will call and come in. She will return for reevaluation  on a PRN basis.   Follow up plan: Return in about 6 weeks (around 03/06/2024).

## 2024-01-29 ENCOUNTER — Encounter: Payer: Self-pay | Admitting: Medical Oncology

## 2024-01-29 NOTE — Progress Notes (Signed)
 Rapid Diagnostic Service for Malignancy  Hand-off Note  01/29/24 3:25 PM  Tracie Sheppard 08/22/1955 [id number]  Cancer Care, Care Team: Norleen IVAR Kidney, MD Mallie Combes, PA-C Colene Raider, RN, Diagnostic Nurse Navigator  Tracie Sheppard was referred to [facility] on January 12, 2024 for evaluation of:  bone lesion.  The patient's diagnostic work-up included: January 15, 2024 Labs: Kappa/Lambda light chains, Multiple Myeloma panel, CMP, CBC, Reticulocytes, Ferritin, C-Reactive Protein, Sedimentation Rate, Iron and Iron binding capacity. January 17, 2024: NM Bone Scan whole body January 18, 2024: MR Pelvis w wo contrast  The patient was found to not have malignancy at this time, as evaluated for the reason for referral stated above.  The recommended follow-up provided to the patient includes:  follow up with her Primary Care Physician.  We thank you for allowing us  to assist in Tracie Sheppard's care.  The initial and most recent Progress Notes, labs, imaging, procedure(s), and/or consult notes have been routed to you through [facility] or faxed to your office for continuity of care.   Colene KYM Raider, RN, BSN, [facility] Oncology Nurse Navigator, Rapid Diagnostic Services 01/29/2024 3:31 PM

## 2024-02-05 ENCOUNTER — Encounter: Payer: Self-pay | Admitting: Radiology

## 2024-02-08 IMAGING — CT CT ABD-PEL WO/W CM
3 of 12 series · 11 of 46 positions shown, 17 images · IV contrast (agent unspecified)
Comparison: CT June 02, 2020

CLINICAL DATA: Gross hematuria with pelvic cramping. History of
renal stones.

EXAM:
CT ABDOMEN AND PELVIS WITHOUT AND WITH CONTRAST
TECHNIQUE: Multidetector CT imaging of the abdomen and pelvis was performed
following the standard protocol before and following the bolus
administration of intravenous contrast.

[Series 2: abd without pre 5.00 · axial · non-contrast · 0.91mm/px · z∈[-1429,-1199]mm · 4 of 93 slices shown]
[im 16/93  soft-tissue]
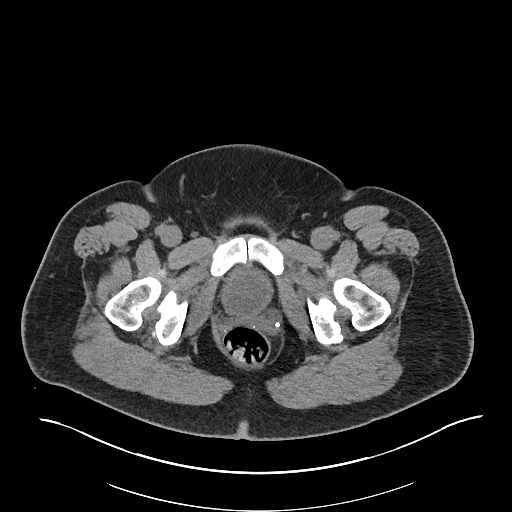
[im 31/93  soft-tissue]
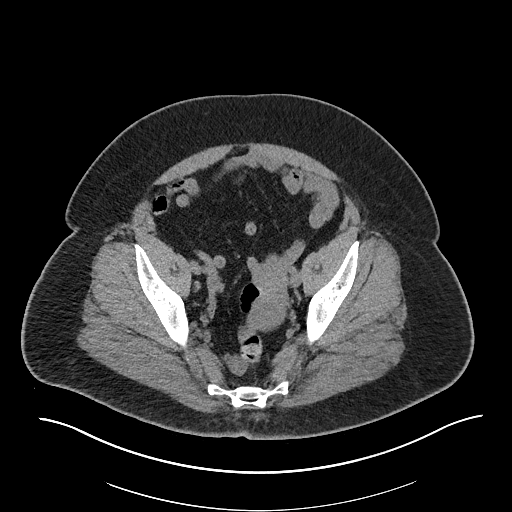
[im 47/93  soft-tissue]
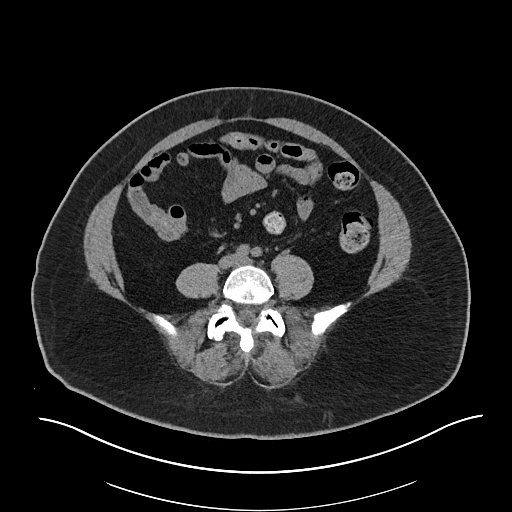
[im 62/93  soft-tissue]
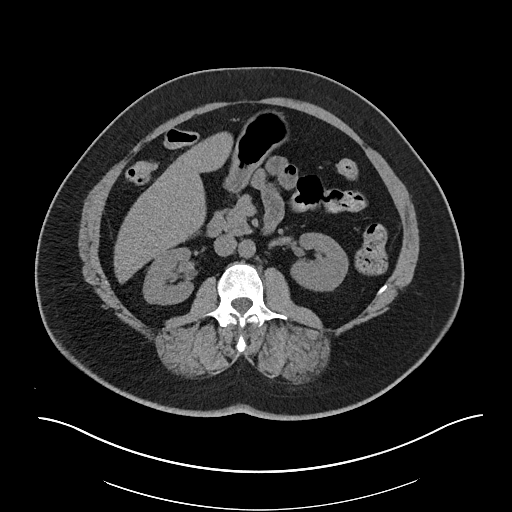

[Series 11: cor with hematuria with 2.00 cor · coronal · 0.91mm/px · 2 of 233 slices shown, 3 images]
[im 78/233  soft-tissue]
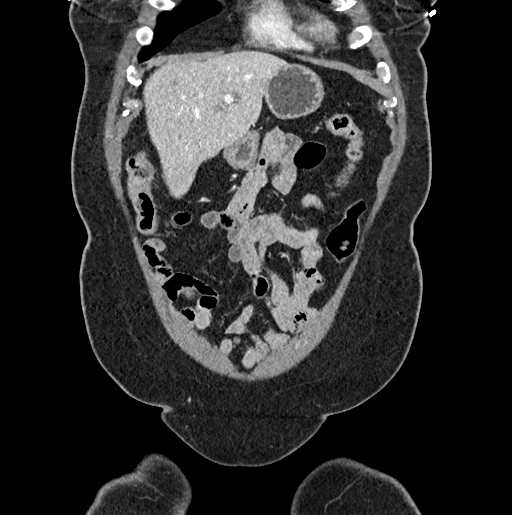
[im 78/233  bone]
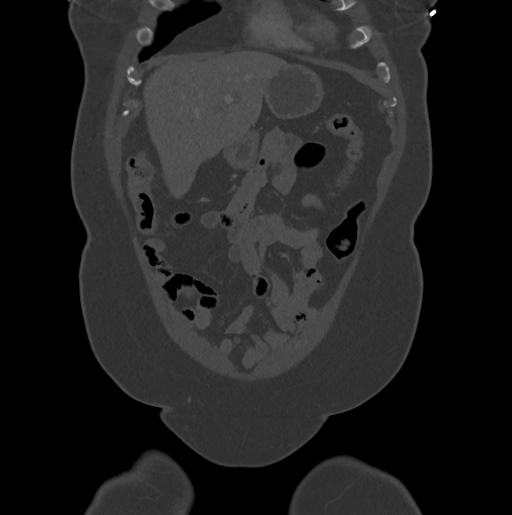
[im 155/233  soft-tissue]
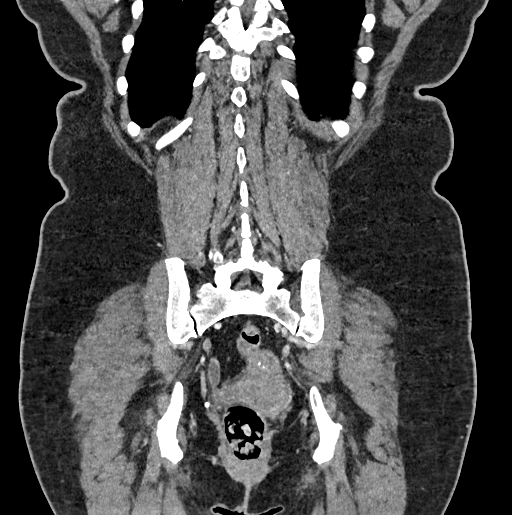

[Series 17: axial delay delay prone 5.00 · axial · delayed · 0.89mm/px · z∈[-1601,-1261]mm · 5 of 104 slices shown, 10 images]
[im 18/104  soft-tissue]
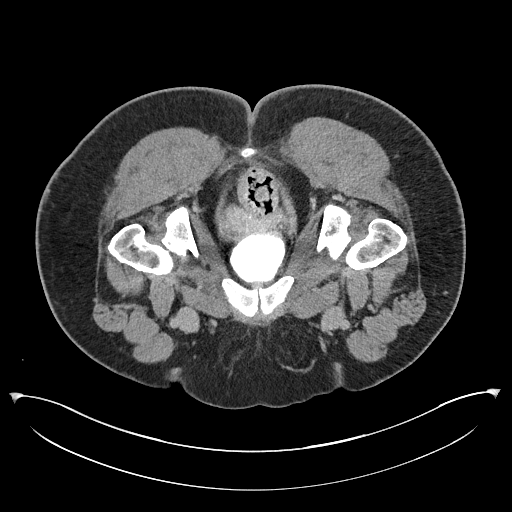
[im 18/104  bone]
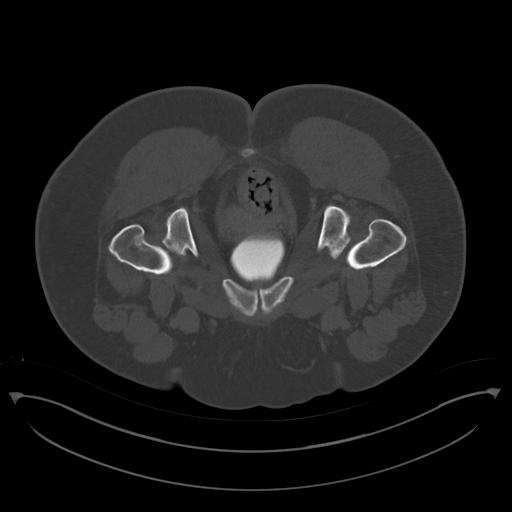
[im 35/104  soft-tissue]
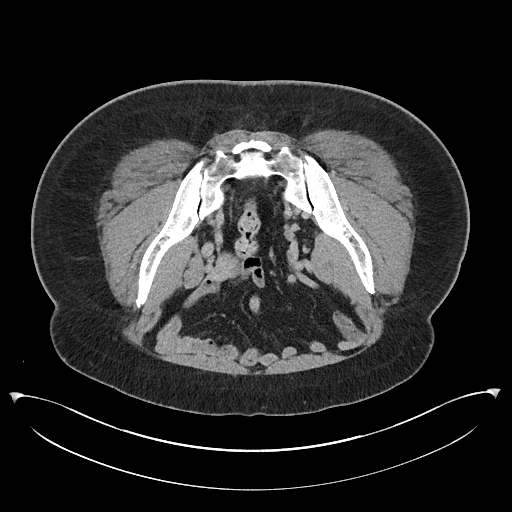
[im 35/104  lung]
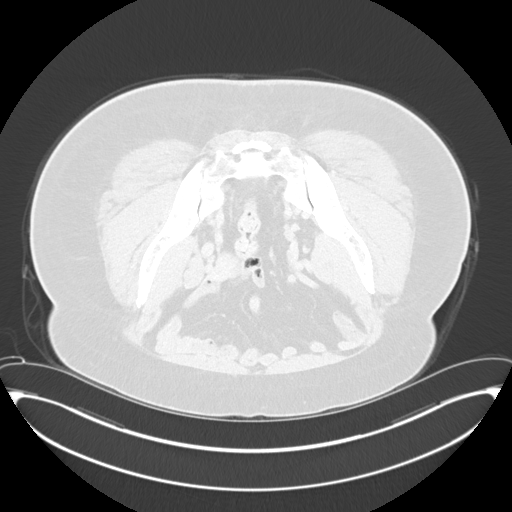
[im 52/104  soft-tissue]
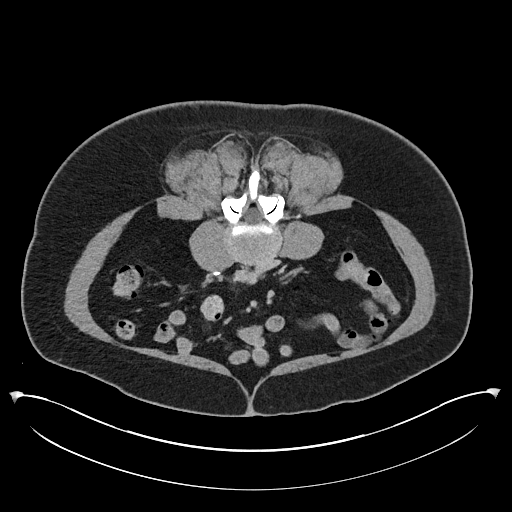
[im 52/104  lung]
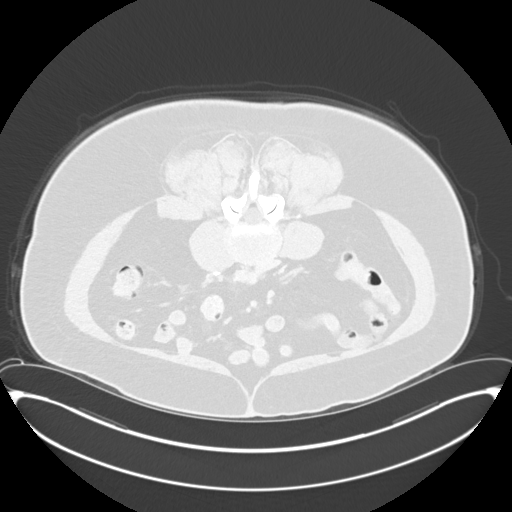
[im 69/104  soft-tissue]
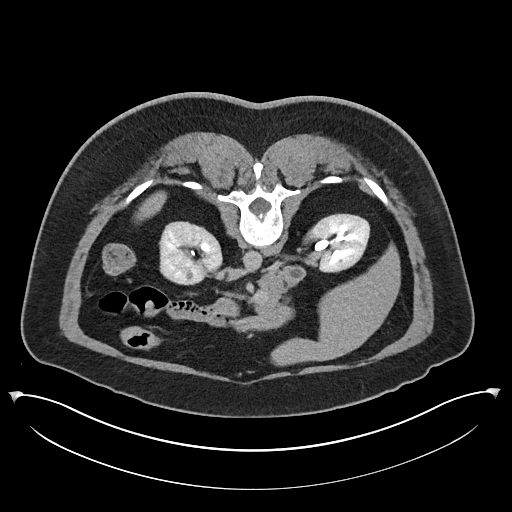
[im 69/104  lung]
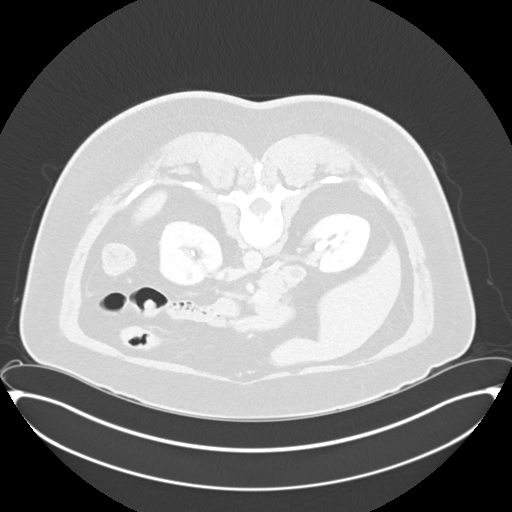
[im 86/104  soft-tissue]
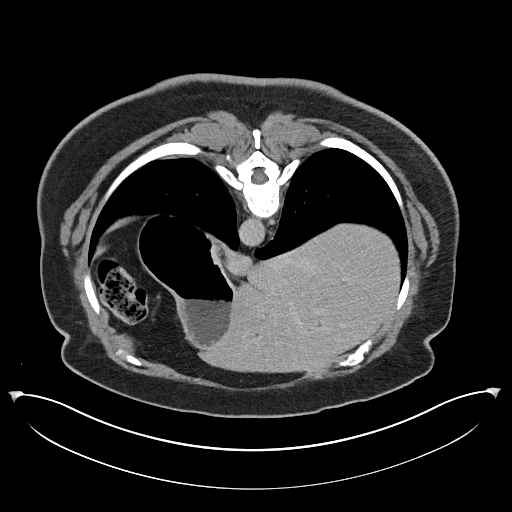
[im 86/104  lung]
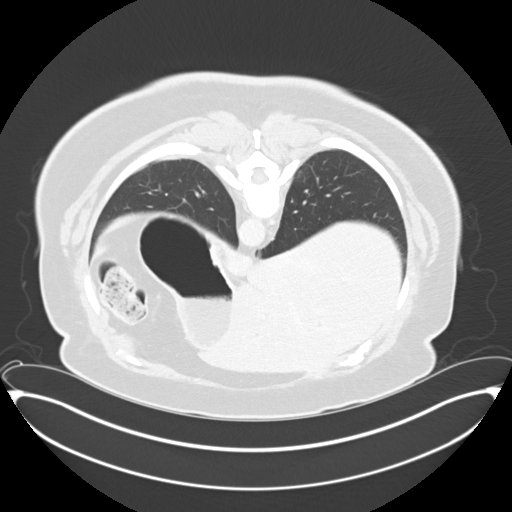

[11 of 46 positions shown; findings below may reference images not displayed]

RADIATION DOSE REDUCTION: This exam was performed according to the
departmental dose-optimization program which includes automated
exposure control, adjustment of the mA and/or kV according to
patient size and/or use of iterative reconstruction technique.

CONTRAST:  125mL OMNIPAQUE IOHEXOL 300 MG/ML  SOLN
FINDINGS: Lower chest: No acute abnormality.

Hepatobiliary: No suspicious hepatic lesion. Gallbladder surgically
absent. Prominence of the biliary tree is likely related to
reservoir effect post cholecystectomy

Pancreas: No pancreatic ductal dilation or evidence of acute
inflammation.

Spleen: No splenomegaly or focal splenic lesion.

Adrenals/Urinary Tract: Stable 1 cm right adrenal myelolipoma on
image [DATE]. Thickening of the left adrenal gland without discrete
nodularity.

Prominence of the right collecting system without discrete hydro
nephrosis. 2 mm stone at the ureteral pelvic junction. Multiple
additional bilateral nonobstructive renal stones measuring up to 2-3
mm bilaterally.

Hypodense 2.2 x 1.5 cm right interpolar renal lesion demonstrates
fluid signal on pre and postcontrast sequences consistent with a
benign Bosniak classification 1 renal cyst. Additional subcentimeter
hypodense renal lesions are technically too small to accurately
characterize.

Kidneys demonstrate symmetric enhancement and excretion of contrast
material. Multiple calices demonstrate low-density filling defects
for instance in the left lower pole renal calyx on image 66/19 image
75/19 and in the right upper pole calices on image 82/19, these are
nonspecific possibly reflecting papillary necrosis.

Mild symmetric wall thickening of an incompletely distended urinary
bladder.

Stomach/Bowel: No enteric contrast was administered. Stomach is
unremarkable for degree of distension. No pathologic dilation of
small or large bowel. The appendix and terminal ileum appear normal.
No evidence of acute bowel inflammation.

Vascular/Lymphatic: Normal caliber abdominal aorta. No
pathologically enlarged abdominal lymph nodes.

Reproductive: Enhancing nodule in the uterine fundus measures 14 mm
on image 120/13, commonly reflects a uterine leiomyoma. No
suspicious adnexal mass.

Other: No significant abdominopelvic free fluid.

Musculoskeletal: Multilevel degenerative change of the spine with
degenerative grade 1 L4 on L5 anterolisthesis.
IMPRESSION: 1. Multiple nonobstructive bilateral renal stones measuring up to
2-3 mm bilaterally.
2. Multiple low-density filling defects within the bilateral renal
calices, nonspecific possibly reflecting papillary necrosis.
3. 2.2 cm benign Bosniak classification 1 renal cyst.
4. Enhancing nodule in the uterine fundus measures 14 mm, commonly
reflects a uterine leiomyoma.
5. Stable 1 cm right adrenal myelolipoma.

## 2024-03-07 ENCOUNTER — Encounter: Payer: Self-pay | Admitting: Family Medicine

## 2024-03-07 ENCOUNTER — Ambulatory Visit: Admitting: Family Medicine

## 2024-03-07 VITALS — BP 138/77 | HR 77 | Temp 97.9°F | Ht 64.0 in | Wt 217.4 lb

## 2024-03-07 DIAGNOSIS — F419 Anxiety disorder, unspecified: Secondary | ICD-10-CM

## 2024-03-07 DIAGNOSIS — S41101A Unspecified open wound of right upper arm, initial encounter: Secondary | ICD-10-CM

## 2024-03-07 MED ORDER — BUPROPION HCL ER (XL) 300 MG PO TB24: 300.0000 mg | ORAL_TABLET | Freq: Every day | ORAL | 1 refills | Status: AC

## 2024-03-07 MED ORDER — CLONAZEPAM 0.5 MG PO TABS: ORAL_TABLET | ORAL | 0 refills | Status: AC

## 2024-03-07 MED ORDER — CITALOPRAM HYDROBROMIDE 40 MG PO TABS: 40.0000 mg | ORAL_TABLET | Freq: Every day | ORAL | 1 refills | Status: AC

## 2024-03-07 NOTE — Assessment & Plan Note (Signed)
 Due to IFG and HLD. Encouraged diet and exercise with goal of losing 1-2 lbs per week. Call with any concerns.

## 2024-03-07 NOTE — Assessment & Plan Note (Signed)
 Under fair control on current regimen- does not want to change medicine at this time. Continue current regimen. Continue to monitor. Call with any concerns. Refills given.

## 2024-03-07 NOTE — Progress Notes (Signed)
 BP 138/77   Pulse 77   Temp 97.9 F (36.6 C) (Oral)   Ht 5' 4 (1.626 m)   Wt 217 lb 6.4 oz (98.6 kg)   LMP  (LMP Unknown)   SpO2 97%   BMI 37.32 kg/m    Subjective:    Patient ID: Tracie Sheppard, female    DOB: 1955/05/07, 68 y.o.   MRN: [id number]  HPI: Tracie Sheppard is a 68 y.o. female  Chief Complaint  Patient presents with   Stress    Mood   ANXIETY/DEPRESSION Duration: chronic Status:stable Anxious mood: yes  Excessive worrying: yes Irritability: yes  Sweating: no Nausea: no Palpitations:no Hyperventilation: no Panic attacks: no Agoraphobia: no  Obscessions/compulsions: no Depressed mood: yes    03/07/2024    9:38 AM 11/24/2023    8:51 AM 04/21/2023    9:30 AM 01/03/2023    1:43 PM 10/19/2022   10:35 AM  Depression screen PHQ 2/9  Decreased Interest 0 1 1 1  0  Down, Depressed, Hopeless 1 0 1 1   PHQ - 2 Score 1 1 2 2  0  Altered sleeping 3 3 3 3 3   Tired, decreased energy 2 2 2 3 1   Change in appetite 3 3 3 3 3   Feeling bad or failure about yourself  0 0 1 1 0  Trouble concentrating 1 0 1 0 1  Moving slowly or fidgety/restless 0 0 0 0 0  Suicidal thoughts 0 0 0 0 0  PHQ-9 Score 10 9  12  12  8    Difficult doing work/chores  Somewhat difficult Somewhat difficult Somewhat difficult      Data saved with a previous flowsheet row definition      03/07/2024    9:38 AM 11/24/2023    8:51 AM 04/21/2023    9:30 AM 01/03/2023    1:44 PM  GAD 7 : Generalized Anxiety Score  Nervous, Anxious, on Edge 1 1 1 1   Control/stop worrying 0 0 1   Worry too much - different things 1 1 1  0  Trouble relaxing 0 0 0 0  Restless 0 0 0 0  Easily annoyed or irritable 1 1 1 1   Afraid - awful might happen 0 0 0 0  Total GAD 7 Score 3 3 4    Anxiety Difficulty   Somewhat difficult    Anhedonia: no Weight changes: no Insomnia: no   Hypersomnia: no Fatigue/loss of energy: yes Feelings of worthlessness: no Feelings of guilt: no Impaired concentration/indecisiveness:  no Suicidal ideations: no  Crying spells: no Recent Stressors/Life Changes: yes   Relationship problems: no   Family stress: yes     Financial stress: no    Job stress: yes    Recent death/loss: no  Relevant past medical, surgical, family and social history reviewed and updated as indicated. Interim medical history since our last visit reviewed. Allergies and medications reviewed and updated.  Review of Systems  Constitutional: Negative.   Respiratory: Negative.    Cardiovascular: Negative.   Musculoskeletal: Negative.   Skin: Negative.   Psychiatric/Behavioral:  Positive for dysphoric mood. Negative for agitation, behavioral problems, confusion, decreased concentration, hallucinations, self-injury, sleep disturbance and suicidal ideas. The patient is nervous/anxious. The patient is not hyperactive.     Per HPI unless specifically indicated above     Objective:    BP 138/77   Pulse 77   Temp 97.9 F (36.6 C) (Oral)   Ht 5' 4 (1.626 m)   Wt  217 lb 6.4 oz (98.6 kg)   LMP  (LMP Unknown)   SpO2 97%   BMI 37.32 kg/m   Wt Readings from Last 3 Encounters:  03/07/24 217 lb 6.4 oz (98.6 kg)  01/15/24 215 lb 8 oz (97.8 kg)  01/04/24 217 lb 2 oz (98.5 kg)    Physical Exam Vitals and nursing note reviewed.  Constitutional:      General: She is not in acute distress.    Appearance: Normal appearance. She is not ill-appearing, toxic-appearing or diaphoretic.  HENT:     Head: Normocephalic and atraumatic.     Right Ear: External ear normal.     Left Ear: External ear normal.     Nose: Nose normal.     Mouth/Throat:     Mouth: Mucous membranes are moist.     Pharynx: Oropharynx is clear.  Eyes:     General: No scleral icterus.       Right eye: No discharge.        Left eye: No discharge.     Extraocular Movements: Extraocular movements intact.     Conjunctiva/sclera: Conjunctivae normal.     Pupils: Pupils are equal, round, and reactive to light.  Cardiovascular:      Rate and Rhythm: Normal rate and regular rhythm.     Pulses: Normal pulses.     Heart sounds: Normal heart sounds. No murmur heard.    No friction rub. No gallop.  Pulmonary:     Effort: Pulmonary effort is normal. No respiratory distress.     Breath sounds: Normal breath sounds. No stridor. No wheezing, rhonchi or rales.  Chest:     Chest wall: No tenderness.  Musculoskeletal:        General: Normal range of motion.     Cervical back: Normal range of motion and neck supple.  Skin:    General: Skin is warm and dry.     Capillary Refill: Capillary refill takes less than 2 seconds.     Coloration: Skin is not jaundiced or pale.     Findings: No bruising, erythema, lesion or rash.  Neurological:     General: No focal deficit present.     Mental Status: She is alert and oriented to person, place, and time. Mental status is at baseline.  Psychiatric:        Mood and Affect: Mood normal.        Behavior: Behavior normal.        Thought Content: Thought content normal.        Judgment: Judgment normal.     Results for orders placed or performed in visit on 01/18/24  OPHTHALMOLOGY REPORT-SCANNED   Collection Time: 01/11/24 12:38 PM  Result Value Ref Range   HM Diabetic Eye Exam No Retinopathy No Retinopathy   A Comment        Assessment & Plan:   Problem List Items Addressed This Visit       Other   Anxiety - Primary   Under fair control on current regimen- does not want to change medicine at this time. Continue current regimen. Continue to monitor. Call with any concerns. Refills given.        Relevant Medications   buPROPion  (WELLBUTRIN  XL) 300 MG 24 hr tablet   citalopram  (CELEXA ) 40 MG tablet   Obesity, morbid (HCC)   Due to IFG and HLD. Encouraged diet and exercise with goal of losing 1-2 lbs per week. Call with any concerns.  Other Visit Diagnoses       Open wound of right upper arm, initial encounter       Healing well. Td due. Given today.   Relevant  Orders   Td : Tetanus/diphtheria >7yo Preservative  free (Completed)        Follow up plan: Return in about 3 months (around 05/24/2024) for physical.

## 2024-03-11 ENCOUNTER — Encounter: Payer: Self-pay | Admitting: Family Medicine

## 2024-03-11 ENCOUNTER — Ambulatory Visit: Admitting: Family Medicine

## 2024-03-11 VITALS — BP 139/79 | HR 76 | Ht 64.0 in

## 2024-03-11 DIAGNOSIS — L539 Erythematous condition, unspecified: Secondary | ICD-10-CM | POA: Diagnosis not present

## 2024-03-11 DIAGNOSIS — T50905A Adverse effect of unspecified drugs, medicaments and biological substances, initial encounter: Secondary | ICD-10-CM | POA: Diagnosis not present

## 2024-03-11 NOTE — Progress Notes (Signed)
 BP 139/79   Pulse 76   Ht 5' 4 (1.626 m)   LMP  (LMP Unknown)   BMI 37.32 kg/m    Subjective:    Patient ID: Tracie Sheppard, female    DOB: 1955/05/02, 68 y.o.   MRN: [id number]  HPI: Tracie Sheppard is a 68 y.o. female  Chief Complaint  Patient presents with   Allergic Reaction    Possible reaction to shingles vaccine. Shot given on Friday. Sore, red, feels like a knot in arm.    RASH Duration:  3 days  Location: R arm where she got the shingles shot  Itching: yes Burning: no Redness: yes Oozing: no Scaling: no Blisters: no Painful: yes Fevers: no Change in detergents/soaps/personal care products: no Recent illness: no Recent travel:no History of same: no Context: better Alleviating factors: nothing Treatments attempted:nothing Shortness of breath: no  Throat/tongue swelling: no Myalgias/arthralgias: no  Relevant past medical, surgical, family and social history reviewed and updated as indicated. Interim medical history since our last visit reviewed. Allergies and medications reviewed and updated.  Review of Systems  Constitutional: Negative.   Respiratory: Negative.    Cardiovascular: Negative.   Musculoskeletal: Negative.   Skin:  Positive for color change and rash. Negative for pallor and wound.  Neurological: Negative.   Psychiatric/Behavioral: Negative.      Per HPI unless specifically indicated above     Objective:    BP 139/79   Pulse 76   Ht 5' 4 (1.626 m)   LMP  (LMP Unknown)   BMI 37.32 kg/m   Wt Readings from Last 3 Encounters:  03/07/24 217 lb 6.4 oz (98.6 kg)  01/15/24 215 lb 8 oz (97.8 kg)  01/04/24 217 lb 2 oz (98.5 kg)    Physical Exam Vitals and nursing note reviewed.  Constitutional:      General: She is not in acute distress.    Appearance: Normal appearance. She is not ill-appearing, toxic-appearing or diaphoretic.  HENT:     Head: Normocephalic and atraumatic.     Right Ear: External ear normal.     Left Ear:  External ear normal.     Nose: Nose normal.     Mouth/Throat:     Mouth: Mucous membranes are moist.     Pharynx: Oropharynx is clear.  Eyes:     General: No scleral icterus.       Right eye: No discharge.        Left eye: No discharge.     Extraocular Movements: Extraocular movements intact.     Conjunctiva/sclera: Conjunctivae normal.     Pupils: Pupils are equal, round, and reactive to light.  Cardiovascular:     Rate and Rhythm: Normal rate and regular rhythm.     Pulses: Normal pulses.     Heart sounds: Normal heart sounds. No murmur heard.    No friction rub. No gallop.  Pulmonary:     Effort: Pulmonary effort is normal. No respiratory distress.     Breath sounds: Normal breath sounds. No stridor. No wheezing, rhonchi or rales.  Chest:     Chest wall: No tenderness.  Musculoskeletal:        General: Normal range of motion.     Cervical back: Normal range of motion and neck supple.  Skin:    General: Skin is warm and dry.     Capillary Refill: Capillary refill takes less than 2 seconds.     Coloration: Skin is not jaundiced or pale.  Findings: Erythema (3 inch area of redness on R upper arm, no rash, mild warmth) present. No bruising, lesion or rash.  Neurological:     General: No focal deficit present.     Mental Status: She is alert and oriented to person, place, and time. Mental status is at baseline.  Psychiatric:        Mood and Affect: Mood normal.        Behavior: Behavior normal.        Thought Content: Thought content normal.        Judgment: Judgment normal.     Results for orders placed or performed in visit on 01/18/24  OPHTHALMOLOGY REPORT-SCANNED   Collection Time: 01/11/24 12:38 PM  Result Value Ref Range   HM Diabetic Eye Exam No Retinopathy No Retinopathy   A Comment        Assessment & Plan:   Problem List Items Addressed This Visit   None Visit Diagnoses       Reaction to shot, initial encounter    -  Primary   No allergic reaction.  No need for antibiotics. Reassured patient. Call with any concerns.        Follow up plan: Return for As scheduled.

## 2024-04-08 ENCOUNTER — Ambulatory Visit: Payer: Self-pay

## 2024-04-08 ENCOUNTER — Encounter: Payer: Self-pay | Admitting: Family Medicine

## 2024-04-08 VITALS — BP 160/84 | HR 74 | Temp 98.5°F | Resp 15 | Ht 64.02 in | Wt 217.2 lb

## 2024-04-08 DIAGNOSIS — R319 Hematuria, unspecified: Secondary | ICD-10-CM | POA: Diagnosis not present

## 2024-04-08 DIAGNOSIS — B9689 Other specified bacterial agents as the cause of diseases classified elsewhere: Secondary | ICD-10-CM

## 2024-04-08 DIAGNOSIS — J019 Acute sinusitis, unspecified: Secondary | ICD-10-CM

## 2024-04-08 MED ORDER — PREDNISONE 20 MG PO TABS: 40.0000 mg | ORAL_TABLET | Freq: Every day | ORAL | 0 refills | Status: AC

## 2024-04-08 MED ORDER — AMOXICILLIN-POT CLAVULANATE 875-125 MG PO TABS: 1.0000 | ORAL_TABLET | Freq: Two times a day (BID) | ORAL | 0 refills | Status: AC

## 2024-04-08 NOTE — Telephone Encounter (Signed)
 Called and scheduled patient to see Delon Benders this afternoon at 4:00 pm as an opening became available.

## 2024-04-08 NOTE — Progress Notes (Signed)
 "  BP (!) 160/84 (BP Location: Left Arm, Patient Position: Sitting, Cuff Size: Large)   Pulse 74   Temp 98.5 F (36.9 C) (Oral)   Resp 15   Ht 5' 4.02 (1.626 m)   Wt 217 lb 3.2 oz (98.5 kg)   LMP  (LMP Unknown)   SpO2 98%   BMI 37.26 kg/m    Subjective:    Patient ID: Tracie Sheppard Pounds, female    DOB: 02-17-1956, 69 y.o.   MRN: [id number]  HPI: Tracie Sheppard is a 69 y.o. female with complaints of sinus congestion, coughing, sore throat x 12 days.  She has headaches, body aches, low temp, decreased appetite.  Ear fullness and clogged feeling.  She also complains of UTI symptoms including odor, a tinge of blood and burning symptoms x 3 days or more.  Chief Complaint  Patient presents with   URI    Symptoms started before Christmas but were much milder. Some chills, headaches, head congestion, coughing, cough syrup for overnight coughing relief, body aches, stuffy upped some and using Flonase . Had been using Zrytec but after about 3 days.    Urinary Tract Infection    Started feeling symptoms yesterday and also had a tinge of blood as well.     Relevant past medical, surgical, family and social history reviewed and updated as indicated. Interim medical history since our last visit reviewed. Allergies and medications reviewed and updated.  Review of Systems  Constitutional:  Positive for appetite change, chills and fatigue. Negative for activity change and fever.  HENT:  Positive for congestion, ear pain, postnasal drip, sinus pressure, sinus pain and sore throat.   Respiratory:  Positive for cough and shortness of breath.   Cardiovascular:  Negative for chest pain.  Genitourinary:  Positive for dysuria, hematuria, pelvic pain and urgency. Negative for difficulty urinating.  Musculoskeletal:  Positive for back pain and myalgias.  Neurological:  Positive for dizziness and headaches.  Hematological:  Negative for adenopathy.    Per HPI unless specifically indicated above      Objective:    BP (!) 160/84 (BP Location: Left Arm, Patient Position: Sitting, Cuff Size: Large)   Pulse 74   Temp 98.5 F (36.9 C) (Oral)   Resp 15   Ht 5' 4.02 (1.626 m)   Wt 217 lb 3.2 oz (98.5 kg)   LMP  (LMP Unknown)   SpO2 98%   BMI 37.26 kg/m   Wt Readings from Last 3 Encounters:  04/08/24 217 lb 3.2 oz (98.5 kg)  03/07/24 217 lb 6.4 oz (98.6 kg)  01/15/24 215 lb 8 oz (97.8 kg)    Physical Exam Constitutional:      General: She is not in acute distress.    Appearance: She is obese. She is not ill-appearing, toxic-appearing or diaphoretic.  HENT:     Head: Normocephalic and atraumatic.     Right Ear: Tympanic membrane and ear canal normal.     Left Ear: Tympanic membrane and ear canal normal.     Nose: Rhinorrhea present.     Mouth/Throat:     Mouth: Mucous membranes are moist.     Pharynx: No oropharyngeal exudate or posterior oropharyngeal erythema.  Cardiovascular:     Rate and Rhythm: Regular rhythm.     Pulses: Normal pulses.     Heart sounds: Normal heart sounds.  Pulmonary:     Effort: Pulmonary effort is normal.     Breath sounds: Normal breath sounds.  Abdominal:  General: There is no distension.     Palpations: There is no mass.     Tenderness: There is no abdominal tenderness. There is no right CVA tenderness, left CVA tenderness, guarding or rebound.     Hernia: No hernia is present.  Skin:    General: Skin is warm and dry.  Neurological:     Mental Status: She is alert.     Results for orders placed or performed in visit on 01/18/24  OPHTHALMOLOGY REPORT-SCANNED   Collection Time: 01/11/24 12:38 PM  Result Value Ref Range   HM Diabetic Eye Exam No Retinopathy No Retinopathy   A Comment        Assessment & Plan:   Assessment & Plan Hematuria, unspecified type Blood, few bacteria, no nitrites.  Send for culture. Orders:   Urinalysis, Routine w reflex microscopic   Urine Culture  Acute bacterial sinusitis Symptoms ongoing x 12  days with chills and fever.  Will send augmentin  and prednisone  for cough.  Continue supportive treatment like flonase  and zyrtect and tylenol  for fever and chills.  Follow up if not improving. Orders:   amoxicillin -clavulanate (AUGMENTIN ) 875-125 MG tablet; Take 1 tablet by mouth 2 (two) times daily for 7 days.   predniSONE  (DELTASONE ) 20 MG tablet; Take 2 tablets (40 mg total) by mouth daily with breakfast.   Follow up plan: Follow up if not improving.      "

## 2024-04-08 NOTE — Telephone Encounter (Signed)
 FYI Only or Action Required?: Action required by provider: request for appointment.  Patient was last seen in primary care on 03/11/2024 by Vicci Duwaine SQUIBB, DO.  Called Nurse Triage reporting Cough.  Symptoms began a week ago.  Interventions attempted: Nothing.  Symptoms are: unchanged.  Triage Disposition: See Physician Within 24 Hours  Patient/caregiver understands and will follow disposition?: No, wishes to speak with PCP   Copied from CRM [id number]. Topic: Clinical - Red Word Triage >> Apr 08, 2024  8:42 AM Tracie Sheppard wrote: Red Word that prompted transfer to Nurse Triage:  heavy cough (nonproductive), ear stopped, head congested.. Symptoms worsened Reason for Disposition  SEVERE coughing spells (e.g., whooping sound after coughing, vomiting after coughing)  Answer Assessment - Initial Assessment Questions No available appts. Offered appt alt prov, patient declines. Patient requesting work in visit today.  Advised UC today and ED/ 911 if symptoms worsen. Patient verbalized understanding.  1. ONSET: When did the cough begin?      Week ago 2. SEVERITY: How bad is the cough today?      moderate 3. SPUTUM: Describe the color of your sputum (e.g., none, dry cough; clear, white, yellow, green)     no 4. HEMOPTYSIS: Are you coughing up any blood? If Yes, ask: How much? (e.g., flecks, streaks, tablespoons, etc.)     no 5. DIFFICULTY BREATHING: Are you having difficulty breathing? If Yes, ask: How bad is it? (e.g., mild, moderate, severe)      no 6. FEVER: Do you have a fever? If Yes, ask: What is your temperature, how was it measured, and when did it start?     Denies fever chills n/v 7. CARDIAC HISTORY: Do you have any history of heart disease? (e.g., heart attack, congestive heart failure)      no 8. LUNG HISTORY: Do you have any history of lung disease?  (e.g., pulmonary embolus, asthma, emphysema)     no 9. PE RISK FACTORS: Do you have a history of  blood clots? (or: recent major surgery, recent prolonged travel, bedridden)     no 10. OTHER SYMPTOMS: Do you have any other symptoms? (e.g., runny nose, wheezing, chest pain) Sore throat, denies chest pain sob uti-cloudy, urine, lower abd pain 4 days ago, burning stinging, blood when wipe  12. TRAVEL: Have you traveled out of the country in the last month? (e.g., travel history, exposures)       husband  Protocols used: Cough - Acute Productive-A-AH

## 2024-04-09 LAB — URINALYSIS, ROUTINE W REFLEX MICROSCOPIC
Bilirubin, UA: NEGATIVE
Glucose, UA: NEGATIVE
Ketones, UA: NEGATIVE
Nitrite, UA: NEGATIVE
Specific Gravity, UA: 1.025 (ref 1.005–1.030)
Urobilinogen, Ur: 2 mg/dL — ABNORMAL HIGH (ref 0.2–1.0)
pH, UA: 6 (ref 5.0–7.5)

## 2024-04-09 LAB — MICROSCOPIC EXAMINATION

## 2024-04-13 LAB — URINE CULTURE

## 2024-04-15 ENCOUNTER — Ambulatory Visit: Payer: Self-pay

## 2024-04-24 ENCOUNTER — Other Ambulatory Visit: Payer: Self-pay

## 2024-04-24 DIAGNOSIS — N390 Urinary tract infection, site not specified: Secondary | ICD-10-CM

## 2024-04-26 MED ORDER — ONDANSETRON 4 MG PO TBDP: 4.0000 mg | ORAL_TABLET | Freq: Three times a day (TID) | ORAL | 0 refills | Status: AC | PRN

## 2024-05-03 ENCOUNTER — Ambulatory Visit
Admission: RE | Admit: 2024-05-03 | Discharge: 2024-05-03 | Disposition: A | Discharge status: Home / Self Care | Attending: Gastroenterology | Admitting: Gastroenterology

## 2024-05-03 ENCOUNTER — Encounter
Admission: RE | Disposition: A | Discharge status: Home / Self Care | Payer: Self-pay | Source: Home / Self Care | Attending: Gastroenterology

## 2024-05-03 ENCOUNTER — Ambulatory Visit: Admitting: Certified Registered"

## 2024-05-03 ENCOUNTER — Encounter: Payer: Self-pay | Admitting: Gastroenterology

## 2024-05-03 DIAGNOSIS — Q438 Other specified congenital malformations of intestine: Secondary | ICD-10-CM | POA: Insufficient documentation

## 2024-05-03 DIAGNOSIS — Z860101 Personal history of adenomatous and serrated colon polyps: Secondary | ICD-10-CM | POA: Diagnosis not present

## 2024-05-03 DIAGNOSIS — I10 Essential (primary) hypertension: Secondary | ICD-10-CM | POA: Diagnosis not present

## 2024-05-03 DIAGNOSIS — Z1211 Encounter for screening for malignant neoplasm of colon: Secondary | ICD-10-CM | POA: Insufficient documentation

## 2024-05-03 MED ORDER — LIDOCAINE HCL (CARDIAC) PF 100 MG/5ML IV SOSY
PREFILLED_SYRINGE | INTRAVENOUS | Status: DC | PRN
  Administered 2024-05-03: 100 mg via INTRAVENOUS

## 2024-05-03 MED ORDER — PROPOFOL 500 MG/50ML IV EMUL
INTRAVENOUS | Status: DC | PRN
  Administered 2024-05-03: 150 ug/kg/min via INTRAVENOUS
  Administered 2024-05-03: 50 mg via INTRAVENOUS

## 2024-05-03 MED ORDER — SODIUM CHLORIDE 0.9 % IV SOLN
INTRAVENOUS | Status: DC
  Administered 2024-05-03: 20 mL/h via INTRAVENOUS

## 2024-05-03 NOTE — Op Note (Signed)
 [facility] Gastroenterology Patient Name: Tracie Sheppard Procedure Date: 05/03/2024 10:23 AM MRN: [id number] Account #: [id number] Date of Birth: 1955-04-08 Admit Type: Outpatient Age: 69 Room: [facility] ENDO ROOM 2 Gender: Female Note Status: Finalized Instrument Name: Colon Scope [id number] Procedure:             Colonoscopy Indications:           Surveillance: Personal history of adenomatous polyps                         on last colonoscopy 5 years ago, Last colonoscopy:                         August 2020 Providers:             Corinn Jess Brooklyn MD, MD Referring MD:          Duwaine MYRTIS Louder (Referring MD) Medicines:             General Anesthesia Complications:         No immediate complications. Estimated blood loss: None. Procedure:             Pre-Anesthesia Assessment:                        - Prior to the procedure, a History and Physical was                         performed, and patient medications and allergies were                         reviewed. The patient is competent. The risks and                         benefits of the procedure and the sedation options and                         risks were discussed with the patient. All questions                         were answered and informed consent was obtained.                         Patient identification and proposed procedure were                         verified by the physician, the nurse, the                         anesthesiologist, the anesthetist and the technician                         in the pre-procedure area in the procedure room in the                         endoscopy suite. Mental Status Examination: alert and                         oriented. Airway Examination: normal oropharyngeal  airway and neck mobility. Respiratory Examination:                         clear to auscultation. CV Examination: normal.                         Prophylactic Antibiotics: The patient  does not require                         prophylactic antibiotics. Prior Anticoagulants: The                         patient has taken no anticoagulant or antiplatelet                         agents. ASA Grade Assessment: III - A patient with                         severe systemic disease. After reviewing the risks and                         benefits, the patient was deemed in satisfactory                         condition to undergo the procedure. The anesthesia                         plan was to use general anesthesia. Immediately prior                         to administration of medications, the patient was                         re-assessed for adequacy to receive sedatives. The                         heart rate, respiratory rate, oxygen saturations,                         blood pressure, adequacy of pulmonary ventilation, and                         response to care were monitored throughout the                         procedure. The physical status of the patient was                         re-assessed after the procedure.                        After obtaining informed consent, the colonoscope was                         passed under direct vision. Throughout the procedure,                         the patient's blood pressure, pulse, and oxygen  saturations were monitored continuously. The                         Colonoscope was introduced through the anus and                         advanced to the the cecum, identified by appendiceal                         orifice and ileocecal valve. The colonoscopy was                         performed with moderate difficulty due to significant                         looping and the patient's body habitus. Successful                         completion of the procedure was aided by applying                         abdominal pressure. The patient tolerated the                         procedure well. The quality of  the bowel preparation                         was evaluated using the BBPS [employer] [employer] [facility] Bowel Preparation                         Scale) with scores of: Right Colon = 3, Transverse                         Colon = 3 and Left Colon = 3 (entire mucosa seen well                         with no residual staining, small fragments of stool or                         opaque liquid). The total BBPS score equals 9. The                         ileocecal valve, appendiceal orifice, and rectum were                         photographed. Findings:      The perianal and digital rectal examinations were normal. Pertinent       negatives include normal sphincter tone and no palpable rectal lesions.      The entire examined colon appeared normal.      The retroflexed view of the distal rectum and anal verge was normal and       showed no anal or rectal abnormalities. Impression:            - The entire examined colon is normal.                        - The distal rectum and anal verge  are normal on                         retroflexion view.                        - No specimens collected. Recommendation:        - Discharge patient to home (with escort).                        - Resume previous diet today.                        - Continue present medications.                        - Repeat colonoscopy in 7 years for screening purposes. Procedure Code(s):     --- Professional ---                        H9894, Colorectal cancer screening; colonoscopy on                         individual at high risk Diagnosis Code(s):     --- Professional ---                        Z86.010, Personal history of colonic polyps CPT copyright 2022 [employer]. All rights reserved. The codes documented in this report are preliminary and upon coder review may  be revised to meet current compliance requirements. Dr. Corinn Brooklyn Corinn Jess Brooklyn MD, MD 05/03/2024 10:55:19 AM This report has been signed  electronically. Number of Addenda: 0 Note Initiated On: 05/03/2024 10:23 AM Scope Withdrawal Time: 0 hours 5 minutes 56 seconds  Total Procedure Duration: 0 hours 10 minutes 5 seconds  Estimated Blood Loss:  Estimated blood loss: none.      [facility]

## 2024-05-03 NOTE — Anesthesia Postprocedure Evaluation (Signed)
"   Anesthesia Post Note  Patient: Tracie Sheppard  Procedure(s) Performed: COLONOSCOPY  Patient location during evaluation: PACU Anesthesia Type: General Level of consciousness: awake and alert Pain management: pain level controlled Vital Signs Assessment: post-procedure vital signs reviewed and stable Respiratory status: spontaneous breathing, nonlabored ventilation, respiratory function stable and patient connected to nasal cannula oxygen Cardiovascular status: blood pressure returned to baseline and stable Postop Assessment: no apparent nausea or vomiting Anesthetic complications: no   There were no known notable events for this encounter.   Last Vitals:  Vitals:   05/03/24 1108 05/03/24 1118  BP: (!) 140/106 (!) 144/79  Pulse: 76   Resp: 13 (P) 12  Temp:    SpO2: 95% 99%    Last Pain:  Vitals:   05/03/24 1118  TempSrc:   PainSc: 0-No pain                 Fairy A Perpet[place] Elling      "

## 2024-05-03 NOTE — Transfer of Care (Signed)
 Immediate Anesthesia Transfer of Care Note  Patient: Tracie Sheppard  Procedure(s) Performed: COLONOSCOPY  Patient Location: Endoscopy Unit  Anesthesia Type:General  Level of Consciousness: drowsy and patient cooperative  Airway & Oxygen Therapy: Patient Spontanous Breathing and Patient connected to nasal cannula oxygen  Post-op Assessment: Report given to RN and Post -op Vital signs reviewed and stable  Post vital signs: stable  Last Vitals:  Vitals Value Taken Time  BP 141/96 05/03/24 10:58  Temp    Pulse 80 05/03/24 11:00  Resp 18 05/03/24 11:00  SpO2 93 % 05/03/24 11:00  Vitals shown include unfiled device data.  Last Pain:  Vitals:   05/03/24 1058  TempSrc:   PainSc: Asleep         Complications: No notable events documented.

## 2024-05-03 NOTE — Anesthesia Preprocedure Evaluation (Signed)
"                                    Anesthesia Evaluation  Patient identified by MRN, date of birth, ID band Patient awake    Reviewed: Allergy & Precautions, H&P , NPO status , Patient's Chart, lab work & pertinent test results  Airway Mallampati: II  TM Distance: >3 FB Neck ROM: Full    Dental no notable dental hx.    Pulmonary neg pulmonary ROS   Pulmonary exam normal breath sounds clear to auscultation       Cardiovascular hypertension, negative cardio ROS Normal cardiovascular exam Rhythm:Regular Rate:Normal     Neuro/Psych negative neurological ROS  negative psych ROS   GI/Hepatic negative GI ROS, Neg liver ROS,,,  Endo/Other  negative endocrine ROS    Renal/GU negative Renal ROS  negative genitourinary   Musculoskeletal negative musculoskeletal ROS (+)    Abdominal   Peds negative pediatric ROS (+)  Hematology negative hematology ROS (+)   Anesthesia Other Findings   Reproductive/Obstetrics negative OB ROS                              Anesthesia Physical Anesthesia Plan  ASA: 3  Anesthesia Plan: General   Post-op Pain Management:    Induction: Intravenous  PONV Risk Score and Plan:   Airway Management Planned:   Additional Equipment:   Intra-op Plan:   Post-operative Plan: Extubation in OR  Informed Consent: I have reviewed the patients History and Physical, chart, labs and discussed the procedure including the risks, benefits and alternatives for the proposed anesthesia with the patient or authorized representative who has indicated his/her understanding and acceptance.     Dental advisory given  Plan Discussed with: CRNA  Anesthesia Plan Comments:         Anesthesia Quick Evaluation  "

## 2024-05-03 NOTE — H&P (Signed)
 "   Tracie JONELLE Brooklyn, MD [facility], DHIP [address]  [place], [place] [address]  Main: [phone] Fax:  [phone] Pager: [phone]   Primary Care Physician:  Vicci Duwaine SQUIBB, DO Primary Gastroenterologist:  Dr. Corinn JONELLE Sheppard  Pre-Procedure History & Physical: HPI:  Tracie Sheppard is a 69 y.o. female is here for an colonoscopy.   Past Medical History:  Diagnosis Date   Allergy    Seasonal   Anxiety    Arthritis    Bilateral carpal tunnel syndrome 05/30/2017   Chronic back pain    Depression, major, recurrent, moderate (HCC)    High serum testosterone    HTN (hypertension) 09/12/2019   Kidney stone    Sleep apnea    Thyroid  disease    did not show up in lab work but per other symptoms previous doctor started her on this    Past Surgical History:  Procedure Laterality Date   CHOLECYSTECTOMY     1997   COLONOSCOPY WITH PROPOFOL  N/A 11/16/2018   Procedure: COLONOSCOPY WITH PROPOFOL ;  Surgeon: Sheppard Tracie Skiff, MD;  Location: [facility] ENDOSCOPY;  Service: Gastroenterology;  Laterality: N/A;   CYSTOSCOPY  09/03/2021   ESOPHAGOGASTRODUODENOSCOPY (EGD) WITH PROPOFOL  N/A 11/16/2018   Procedure: ESOPHAGOGASTRODUODENOSCOPY (EGD) WITH PROPOFOL ;  Surgeon: Sheppard Tracie Skiff, MD;  Location: [facility] ENDOSCOPY;  Service: Gastroenterology;  Laterality: N/A;   NASAL SEPTUM SURGERY     OOPHORECTOMY     TUBAL LIGATION     1989    Prior to Admission medications  Medication Sig Start Date End Date Taking? Authorizing Provider  acetaminophen  (TYLENOL ) 500 MG tablet Tylenol  Extra Strength 500 mg tablet    [provider]  Alpha-Lipoic Acid 600 MG CAPS Take 600 mg by mouth daily.    [provider]  buPROPion  (WELLBUTRIN  XL) 300 MG 24 hr tablet Take 1 tablet (300 mg total) by mouth daily. 03/07/24   Johnson, Megan P, DO  cetirizine  (ZYRTEC ) 10 MG tablet Take 1 tablet (10 mg total) by mouth daily as needed for allergies. 04/21/23   Johnson,  Megan P, DO  citalopram  (CELEXA ) 40 MG tablet Take 1 tablet (40 mg total) by mouth daily. 03/07/24   Johnson, Megan P, DO  clonazePAM  (KLONOPIN ) 0.5 MG tablet TAKE 1 TABLET BY MOUTH DAILY AS NEEDED FOR ANIXETY 03/07/24   Johnson, Megan P, DO  Cyanocobalamin 1000 MCG TBCR Take 1,000 mcg by mouth daily.    [provider]  cyclobenzaprine  (FLEXERIL ) 10 MG tablet Take 0.5-1 tablets (5-10 mg total) by mouth at bedtime. 01/03/23   Pearley, Hyla Givens, NP  estradiol -norethindrone (COMBIPATCH ) 0.05-0.14 MG/DAY Place 1 patch onto the skin 2 (two) times a week. 11/27/23   Johnson, Megan P, DO  fluticasone  (FLONASE ) 50 MCG/ACT nasal spray SPRAY 1 SPRAY INTO EACH NOSTRIL EVERY DAY 04/21/23   Johnson, Megan P, DO  meloxicam  (MOBIC ) 15 MG tablet Take 1 tablet (15 mg total) by mouth daily. 11/24/23   Vicci Duwaine P, DO  ondansetron  (ZOFRAN -ODT) 4 MG disintegrating tablet Take 1 tablet (4 mg total) by mouth every 8 (eight) hours as needed for nausea or vomiting. 04/26/24   Vicci Duwaine P, DO  predniSONE  (DELTASONE ) 20 MG tablet Take 2 tablets (40 mg total) by mouth daily with breakfast. 04/08/24   Con Delon HERO, FNP  propranolol  ER (INDERAL  LA) 60 MG 24 hr capsule Take 1 capsule (60 mg total) by mouth daily. 11/24/23   Johnson, Megan P, DO  rosuvastatin  (CRESTOR ) 20  MG tablet Take 1 tablet (20 mg total) by mouth once a week. 05/12/23   Vicci Bouchard P, DO  spironolactone  (ALDACTONE ) 100 MG tablet Take 1 tablet (100 mg total) by mouth 2 (two) times daily. 11/24/23   Vicci Bouchard P, DO  VITAMIN D  PO Take by mouth daily. 1000 mcg    [provider]    Allergies as of 04/23/2024 - Review Complete 04/08/2024  Allergen Reaction Noted   Aleve  [naproxen  sodium] Other (See Comments) 11/08/2016   Statins Other (See Comments) 07/25/2017    Family History  Problem Relation Age of Onset   Diabetes Mother    Anemia Mother    Hyperlipidemia Mother    Arthritis Mother    Hypertension Father     Cancer Father    Celiac disease Sister    Diabetes Maternal Grandmother    Stroke Maternal Grandmother    Heart attack Maternal Grandmother    Heart disease Paternal Grandfather     Social History   Socioeconomic History   Marital status: Married    Spouse name: Not on file   Number of children: 1   Years of education: 14   Highest education level: Associate degree: occupational, scientist, product/process development, or vocational program  Occupational History   Occupation: unemployed  Tobacco Use   Smoking status: Never   Smokeless tobacco: Never  Vaping Use   Vaping status: Never Used  Substance and Sexual Activity   Alcohol use: Yes    Comment: On occasion   Drug use: No   Sexual activity: Yes    Birth control/protection: Post-menopausal, None  Other Topics Concern   Not on file  Social History Narrative   Lives with spouse   Caffeine use: 1-2 drinks per day   Right handed    Social Drivers of Health   Tobacco Use: Low Risk  (04/17/2024)   Received from [facility]   Patient History    Smoking Tobacco Use: Never    Smokeless Tobacco Use: Never    Passive Exposure: Not on file  Financial Resource Strain: Low Risk  (04/18/2024)   Received from [facility]   Overall Financial Resource Strain (CARDIA)    Difficulty of Paying Living Expenses: Not hard at all  Food Insecurity: No Food Insecurity (04/18/2024)   Received from [facility]   Epic    Within the past 12 months, you worried that your food would run out before you got the money to buy more.: Never true    Within the past 12 months, the food you bought just didn't last and you didn't have money to get more.: Never true  Transportation Needs: No Transportation Needs (04/18/2024)   Received from [facility] - Transportation    In the past 12 months, has lack of transportation kept you from medical appointments or from getting medications?: No    Lack of  Transportation (Non-Medical): No  Physical Activity: Inactive (10/19/2023)   Exercise Vital Sign    Days of Exercise per Week: 0 days    Minutes of Exercise per Session: Not on file  Stress: Stress Concern Present (10/19/2023)   [employer] - Occupational Stress Questionnaire    Feeling of Stress: To some extent  Social Connections: Socially Integrated (01/24/2024)   Social Connection and Isolation Panel    Frequency of Communication with Friends and Family: More than three times a week    Frequency of Social  Gatherings with Friends and Family: Twice a week    Attends Religious Services: More than 4 times per year    Active Member of Clubs or Organizations: Yes    Attends Engineer, Structural: More than 4 times per year    Marital Status: Married  Catering Manager Violence: Not on file  Depression (PHQ2-9): Medium Risk (04/08/2024)   Depression (PHQ2-9)    PHQ-2 Score: 9  Alcohol Screen: Low Risk (01/24/2024)   Alcohol Screen    Last Alcohol Screening Score (AUDIT): 2  Housing: Low Risk  (04/18/2024)   Received from [facility]   Epic    In the last 12 months, was there a time when you were not able to pay the mortgage or rent on time?: No    In the past 12 months, how many times have you moved where you were living?: 0    At any time in the past 12 months, were you homeless or living in a shelter (including now)?: No  Utilities: Not At Risk (04/18/2024)   Received from [facility]   Epic    In the past 12 months has the electric, gas, oil, or water company threatened to shut off services in your home?: No  Health Literacy: Not on file    Review of Systems: See HPI, otherwise negative ROS  Physical Exam: BP 135/73   Pulse 83   Temp (!) 96.9 F (36.1 C) (Temporal)   Resp 20   Ht 5' 4 (1.626 m)   Wt 97 kg   LMP  (LMP Unknown)   SpO2 100%   BMI 36.70 kg/m  General:   Alert,  pleasant and cooperative  in NAD Head:  Normocephalic and atraumatic. Neck:  Supple; no masses or thyromegaly. Lungs:  Clear throughout to auscultation.    Heart:  Regular rate and rhythm. Abdomen:  Soft, nontender and nondistended. Normal bowel sounds, without guarding, and without rebound.   Neurologic:  Alert and  oriented x4;  grossly normal neurologically.  Impression/Plan: Tracie Sheppard is here for an colonoscopy to be performed for h/o colon adenoma  Risks, benefits, limitations, and alternatives regarding  colonoscopy have been reviewed with the patient.  Questions have been answered.  All parties agreeable.   Tracie Brooklyn, MD  05/03/2024, 10:28 AM "

## 2024-05-27 ENCOUNTER — Ambulatory Visit: Admitting: Family Medicine
